# Patient Record
Sex: Female | Born: 1980
Health system: Southern US, Community
[De-identification: ages and names within clinical notes are randomized; demographics above are authoritative.]

## PROBLEM LIST (undated history)

## (undated) ENCOUNTER — Inpatient Hospital Stay (HOSPITAL_COMMUNITY): Payer: Self-pay

## (undated) DIAGNOSIS — N760 Acute vaginitis: Secondary | ICD-10-CM

## (undated) DIAGNOSIS — B9689 Other specified bacterial agents as the cause of diseases classified elsewhere: Secondary | ICD-10-CM

## (undated) DIAGNOSIS — O24419 Gestational diabetes mellitus in pregnancy, unspecified control: Secondary | ICD-10-CM

## (undated) DIAGNOSIS — I10 Essential (primary) hypertension: Secondary | ICD-10-CM

## (undated) DIAGNOSIS — G4730 Sleep apnea, unspecified: Secondary | ICD-10-CM

## (undated) DIAGNOSIS — T4145XA Adverse effect of unspecified anesthetic, initial encounter: Secondary | ICD-10-CM

## (undated) DIAGNOSIS — S93402A Sprain of unspecified ligament of left ankle, initial encounter: Secondary | ICD-10-CM

## (undated) DIAGNOSIS — N390 Urinary tract infection, site not specified: Secondary | ICD-10-CM

## (undated) DIAGNOSIS — T8859XA Other complications of anesthesia, initial encounter: Secondary | ICD-10-CM

## (undated) DIAGNOSIS — E669 Obesity, unspecified: Secondary | ICD-10-CM

## (undated) HISTORY — DX: Obesity, unspecified: E66.9

## (undated) HISTORY — DX: Essential (primary) hypertension: I10

## (undated) HISTORY — DX: Morbid (severe) obesity due to excess calories: E66.01
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
## 2002-09-01 ENCOUNTER — Encounter: Payer: Self-pay | Admitting: Emergency Medicine

## 2002-09-01 ENCOUNTER — Emergency Department (HOSPITAL_COMMUNITY): Admission: EM | Admit: 2002-09-01 | Discharge: 2002-09-01 | Payer: Self-pay | Admitting: Emergency Medicine

## 2003-10-19 ENCOUNTER — Encounter: Admission: RE | Admit: 2003-10-19 | Discharge: 2003-10-19 | Payer: Self-pay | Admitting: Family Medicine

## 2003-10-19 ENCOUNTER — Other Ambulatory Visit: Admission: RE | Admit: 2003-10-19 | Discharge: 2003-10-19 | Payer: Self-pay | Admitting: Family Medicine

## 2004-12-31 ENCOUNTER — Emergency Department (HOSPITAL_COMMUNITY): Admission: EM | Admit: 2004-12-31 | Discharge: 2004-12-31 | Payer: Self-pay | Admitting: Emergency Medicine

## 2005-04-22 ENCOUNTER — Inpatient Hospital Stay (HOSPITAL_COMMUNITY): Admission: EM | Admit: 2005-04-22 | Discharge: 2005-04-25 | Payer: Self-pay | Admitting: Emergency Medicine

## 2005-08-01 ENCOUNTER — Other Ambulatory Visit: Admission: RE | Admit: 2005-08-01 | Discharge: 2005-08-01 | Payer: Self-pay | Admitting: Family Medicine

## 2005-12-18 ENCOUNTER — Emergency Department (HOSPITAL_COMMUNITY): Admission: EM | Admit: 2005-12-18 | Discharge: 2005-12-18 | Payer: Self-pay | Admitting: *Deleted

## 2006-05-27 HISTORY — PX: OTHER SURGICAL HISTORY: SHX169

## 2006-07-13 ENCOUNTER — Inpatient Hospital Stay (HOSPITAL_COMMUNITY): Admission: AD | Admit: 2006-07-13 | Discharge: 2006-07-13 | Payer: Self-pay | Admitting: Family Medicine

## 2006-07-13 ENCOUNTER — Emergency Department (HOSPITAL_COMMUNITY): Admission: EM | Admit: 2006-07-13 | Discharge: 2006-07-13 | Payer: Self-pay | Admitting: Emergency Medicine

## 2006-10-07 ENCOUNTER — Encounter (INDEPENDENT_AMBULATORY_CARE_PROVIDER_SITE_OTHER): Payer: Self-pay | Admitting: Specialist

## 2006-10-07 ENCOUNTER — Ambulatory Visit (HOSPITAL_COMMUNITY): Admission: RE | Admit: 2006-10-07 | Discharge: 2006-10-07 | Payer: Self-pay | Admitting: Obstetrics and Gynecology

## 2007-07-13 ENCOUNTER — Ambulatory Visit: Payer: Self-pay | Admitting: Obstetrics & Gynecology

## 2007-07-13 ENCOUNTER — Inpatient Hospital Stay (HOSPITAL_COMMUNITY): Admission: AD | Admit: 2007-07-13 | Discharge: 2007-07-13 | Payer: Self-pay | Admitting: Obstetrics & Gynecology

## 2007-07-29 ENCOUNTER — Ambulatory Visit: Payer: Self-pay | Admitting: Obstetrics & Gynecology

## 2007-07-31 ENCOUNTER — Ambulatory Visit (HOSPITAL_COMMUNITY): Admission: RE | Admit: 2007-07-31 | Discharge: 2007-07-31 | Payer: Self-pay | Admitting: Obstetrics & Gynecology

## 2007-08-04 ENCOUNTER — Ambulatory Visit: Payer: Self-pay | Admitting: *Deleted

## 2007-08-04 ENCOUNTER — Inpatient Hospital Stay (HOSPITAL_COMMUNITY): Admission: AD | Admit: 2007-08-04 | Discharge: 2007-08-04 | Payer: Self-pay | Admitting: Obstetrics & Gynecology

## 2007-08-10 ENCOUNTER — Ambulatory Visit: Payer: Self-pay | Admitting: Obstetrics & Gynecology

## 2007-08-10 ENCOUNTER — Encounter: Admission: RE | Admit: 2007-08-10 | Discharge: 2007-08-17 | Payer: Self-pay | Admitting: Obstetrics & Gynecology

## 2007-08-13 ENCOUNTER — Ambulatory Visit: Payer: Self-pay | Admitting: Gynecology

## 2007-08-16 ENCOUNTER — Ambulatory Visit: Payer: Self-pay | Admitting: Obstetrics and Gynecology

## 2007-08-16 ENCOUNTER — Inpatient Hospital Stay (HOSPITAL_COMMUNITY): Admission: AD | Admit: 2007-08-16 | Discharge: 2007-08-16 | Payer: Self-pay | Admitting: Obstetrics & Gynecology

## 2007-08-17 ENCOUNTER — Ambulatory Visit: Payer: Self-pay | Admitting: Obstetrics & Gynecology

## 2007-08-24 ENCOUNTER — Ambulatory Visit: Payer: Self-pay | Admitting: Obstetrics & Gynecology

## 2007-08-25 ENCOUNTER — Inpatient Hospital Stay (HOSPITAL_COMMUNITY): Admission: AD | Admit: 2007-08-25 | Discharge: 2007-08-25 | Payer: Self-pay | Admitting: Obstetrics & Gynecology

## 2007-08-25 ENCOUNTER — Ambulatory Visit: Payer: Self-pay | Admitting: *Deleted

## 2007-08-31 ENCOUNTER — Ambulatory Visit (HOSPITAL_COMMUNITY): Admission: RE | Admit: 2007-08-31 | Discharge: 2007-08-31 | Payer: Self-pay | Admitting: Obstetrics & Gynecology

## 2007-08-31 ENCOUNTER — Ambulatory Visit: Payer: Self-pay | Admitting: Obstetrics & Gynecology

## 2007-09-02 ENCOUNTER — Ambulatory Visit: Payer: Self-pay | Admitting: Gynecology

## 2007-09-02 ENCOUNTER — Inpatient Hospital Stay (HOSPITAL_COMMUNITY): Admission: AD | Admit: 2007-09-02 | Discharge: 2007-09-05 | Payer: Self-pay | Admitting: Obstetrics & Gynecology

## 2008-02-15 ENCOUNTER — Emergency Department (HOSPITAL_COMMUNITY): Admission: EM | Admit: 2008-02-15 | Discharge: 2008-02-15 | Payer: Self-pay | Admitting: Emergency Medicine

## 2008-04-27 ENCOUNTER — Emergency Department (HOSPITAL_COMMUNITY): Admission: EM | Admit: 2008-04-27 | Discharge: 2008-04-27 | Payer: Self-pay | Admitting: Emergency Medicine

## 2008-06-18 ENCOUNTER — Emergency Department (HOSPITAL_COMMUNITY): Admission: EM | Admit: 2008-06-18 | Discharge: 2008-06-18 | Payer: Self-pay | Admitting: Emergency Medicine

## 2008-07-21 ENCOUNTER — Emergency Department (HOSPITAL_COMMUNITY): Admission: EM | Admit: 2008-07-21 | Discharge: 2008-07-21 | Payer: Self-pay | Admitting: Emergency Medicine

## 2008-09-11 ENCOUNTER — Emergency Department (HOSPITAL_COMMUNITY): Admission: EM | Admit: 2008-09-11 | Discharge: 2008-09-11 | Payer: Self-pay | Admitting: Emergency Medicine

## 2008-10-07 ENCOUNTER — Emergency Department (HOSPITAL_COMMUNITY): Admission: EM | Admit: 2008-10-07 | Discharge: 2008-10-07 | Payer: Self-pay | Admitting: Emergency Medicine

## 2008-11-10 ENCOUNTER — Emergency Department (HOSPITAL_COMMUNITY): Admission: EM | Admit: 2008-11-10 | Discharge: 2008-11-10 | Payer: Self-pay | Admitting: Emergency Medicine

## 2008-11-11 ENCOUNTER — Observation Stay (HOSPITAL_COMMUNITY): Admission: EM | Admit: 2008-11-11 | Discharge: 2008-11-11 | Payer: Self-pay | Admitting: Emergency Medicine

## 2008-12-08 ENCOUNTER — Emergency Department (HOSPITAL_COMMUNITY): Admission: EM | Admit: 2008-12-08 | Discharge: 2008-12-08 | Payer: Self-pay | Admitting: Emergency Medicine

## 2008-12-09 ENCOUNTER — Emergency Department (HOSPITAL_COMMUNITY): Admission: EM | Admit: 2008-12-09 | Discharge: 2008-12-09 | Payer: Self-pay | Admitting: Emergency Medicine

## 2009-01-04 ENCOUNTER — Emergency Department (HOSPITAL_COMMUNITY): Admission: EM | Admit: 2009-01-04 | Discharge: 2009-01-04 | Payer: Self-pay | Admitting: Emergency Medicine

## 2009-01-17 ENCOUNTER — Emergency Department (HOSPITAL_COMMUNITY): Admission: EM | Admit: 2009-01-17 | Discharge: 2009-01-17 | Payer: Self-pay | Admitting: Family Medicine

## 2009-02-21 ENCOUNTER — Emergency Department (HOSPITAL_COMMUNITY): Admission: EM | Admit: 2009-02-21 | Discharge: 2009-02-22 | Payer: Self-pay | Admitting: Emergency Medicine

## 2009-02-22 ENCOUNTER — Inpatient Hospital Stay (HOSPITAL_COMMUNITY): Admission: AD | Admit: 2009-02-22 | Discharge: 2009-02-22 | Payer: Self-pay | Admitting: Obstetrics and Gynecology

## 2009-04-01 ENCOUNTER — Emergency Department (HOSPITAL_COMMUNITY): Admission: EM | Admit: 2009-04-01 | Discharge: 2009-04-02 | Payer: Self-pay | Admitting: Emergency Medicine

## 2009-04-24 ENCOUNTER — Emergency Department (HOSPITAL_COMMUNITY): Admission: EM | Admit: 2009-04-24 | Discharge: 2009-04-24 | Payer: Self-pay | Admitting: Emergency Medicine

## 2009-06-28 ENCOUNTER — Emergency Department (HOSPITAL_COMMUNITY): Admission: EM | Admit: 2009-06-28 | Discharge: 2009-06-28 | Payer: Self-pay | Admitting: Emergency Medicine

## 2009-08-21 ENCOUNTER — Emergency Department (HOSPITAL_COMMUNITY): Admission: EM | Admit: 2009-08-21 | Discharge: 2009-08-21 | Payer: Self-pay | Admitting: Emergency Medicine

## 2009-09-09 ENCOUNTER — Emergency Department (HOSPITAL_COMMUNITY): Admission: EM | Admit: 2009-09-09 | Discharge: 2009-09-09 | Payer: Self-pay | Admitting: Emergency Medicine

## 2009-09-24 ENCOUNTER — Emergency Department (HOSPITAL_COMMUNITY): Admission: EM | Admit: 2009-09-24 | Discharge: 2009-09-24 | Payer: Self-pay | Admitting: Family Medicine

## 2009-09-24 ENCOUNTER — Emergency Department (HOSPITAL_COMMUNITY): Admission: EM | Admit: 2009-09-24 | Discharge: 2009-09-24 | Payer: Self-pay | Admitting: Emergency Medicine

## 2009-10-30 ENCOUNTER — Emergency Department (HOSPITAL_COMMUNITY): Admission: EM | Admit: 2009-10-30 | Discharge: 2009-10-30 | Payer: Self-pay | Admitting: Emergency Medicine

## 2009-11-22 ENCOUNTER — Emergency Department (HOSPITAL_COMMUNITY): Admission: EM | Admit: 2009-11-22 | Discharge: 2009-11-22 | Payer: Self-pay | Admitting: Emergency Medicine

## 2009-12-19 ENCOUNTER — Emergency Department (HOSPITAL_COMMUNITY): Admission: EM | Admit: 2009-12-19 | Discharge: 2009-12-19 | Payer: Self-pay | Admitting: Emergency Medicine

## 2010-01-10 ENCOUNTER — Ambulatory Visit: Payer: Self-pay | Admitting: Obstetrics and Gynecology

## 2010-02-01 ENCOUNTER — Ambulatory Visit (HOSPITAL_COMMUNITY): Admission: RE | Admit: 2010-02-01 | Discharge: 2010-02-01 | Payer: Self-pay | Admitting: Obstetrics & Gynecology

## 2010-02-08 ENCOUNTER — Encounter (INDEPENDENT_AMBULATORY_CARE_PROVIDER_SITE_OTHER): Payer: Self-pay | Admitting: *Deleted

## 2010-02-08 ENCOUNTER — Ambulatory Visit: Payer: Self-pay | Admitting: Obstetrics and Gynecology

## 2010-02-08 LAB — CONVERTED CEMR LAB
Chlamydia, DNA Probe: NEGATIVE
GC Probe Amp, Genital: NEGATIVE

## 2010-02-09 ENCOUNTER — Encounter (INDEPENDENT_AMBULATORY_CARE_PROVIDER_SITE_OTHER): Payer: Self-pay | Admitting: *Deleted

## 2010-02-09 LAB — CONVERTED CEMR LAB
Chloride: 106 meq/L (ref 96–112)
Potassium: 4.2 meq/L (ref 3.5–5.3)
Sodium: 141 meq/L (ref 135–145)

## 2010-02-22 ENCOUNTER — Ambulatory Visit (HOSPITAL_COMMUNITY): Admission: RE | Admit: 2010-02-22 | Discharge: 2010-02-22 | Payer: Self-pay | Admitting: Obstetrics & Gynecology

## 2010-02-23 ENCOUNTER — Ambulatory Visit: Payer: Self-pay | Admitting: Family

## 2010-02-23 ENCOUNTER — Inpatient Hospital Stay (HOSPITAL_COMMUNITY): Admission: AD | Admit: 2010-02-23 | Discharge: 2010-02-23 | Payer: Self-pay | Admitting: Obstetrics & Gynecology

## 2010-02-24 ENCOUNTER — Emergency Department (HOSPITAL_COMMUNITY): Admission: EM | Admit: 2010-02-24 | Discharge: 2010-02-24 | Payer: Self-pay | Admitting: Family Medicine

## 2010-03-01 ENCOUNTER — Ambulatory Visit: Payer: Self-pay | Admitting: Obstetrics and Gynecology

## 2010-03-27 ENCOUNTER — Emergency Department (HOSPITAL_COMMUNITY)
Admission: EM | Admit: 2010-03-27 | Discharge: 2010-03-27 | Payer: Self-pay | Source: Home / Self Care | Admitting: Emergency Medicine

## 2010-05-24 ENCOUNTER — Encounter
Admission: RE | Admit: 2010-05-24 | Discharge: 2010-05-24 | Payer: Self-pay | Source: Home / Self Care | Attending: Family Medicine | Admitting: Family Medicine

## 2010-05-29 ENCOUNTER — Ambulatory Visit: Payer: Self-pay | Admitting: Cardiology

## 2010-06-17 ENCOUNTER — Encounter: Payer: Self-pay | Admitting: Obstetrics & Gynecology

## 2010-06-17 ENCOUNTER — Encounter: Payer: Self-pay | Admitting: *Deleted

## 2010-06-25 ENCOUNTER — Emergency Department (HOSPITAL_COMMUNITY)
Admission: EM | Admit: 2010-06-25 | Discharge: 2010-06-25 | Payer: Self-pay | Source: Home / Self Care | Admitting: Emergency Medicine

## 2010-07-17 ENCOUNTER — Inpatient Hospital Stay (HOSPITAL_COMMUNITY): Payer: PRIVATE HEALTH INSURANCE

## 2010-07-17 ENCOUNTER — Inpatient Hospital Stay (HOSPITAL_COMMUNITY)
Admission: AD | Admit: 2010-07-17 | Discharge: 2010-07-17 | Disposition: A | Discharge status: Home / Self Care | Payer: PRIVATE HEALTH INSURANCE | Source: Ambulatory Visit | Attending: Obstetrics & Gynecology | Admitting: Obstetrics & Gynecology

## 2010-07-17 DIAGNOSIS — R52 Pain, unspecified: Secondary | ICD-10-CM

## 2010-07-17 DIAGNOSIS — R109 Unspecified abdominal pain: Secondary | ICD-10-CM

## 2010-07-17 LAB — URINALYSIS, ROUTINE W REFLEX MICROSCOPIC
Protein, ur: NEGATIVE mg/dL
Urine Glucose, Fasting: NEGATIVE mg/dL
pH: 6.5 (ref 5.0–8.0)

## 2010-07-17 LAB — CBC
HCT: 36.4 % (ref 36.0–46.0)
Hemoglobin: 12 g/dL (ref 12.0–15.0)
RDW: 14.5 % (ref 11.5–15.5)
WBC: 7.1 10*3/uL (ref 4.0–10.5)

## 2010-07-17 LAB — WET PREP, GENITAL
Clue Cells Wet Prep HPF POC: NONE SEEN
Trich, Wet Prep: NONE SEEN
Yeast Wet Prep HPF POC: NONE SEEN

## 2010-07-18 LAB — GC/CHLAMYDIA PROBE AMP, GENITAL: Chlamydia, DNA Probe: NEGATIVE

## 2010-07-26 ENCOUNTER — Institutional Professional Consult (permissible substitution): Payer: PRIVATE HEALTH INSURANCE | Admitting: Family Medicine

## 2010-08-06 ENCOUNTER — Institutional Professional Consult (permissible substitution) (INDEPENDENT_AMBULATORY_CARE_PROVIDER_SITE_OTHER): Payer: PRIVATE HEALTH INSURANCE | Admitting: Family Medicine

## 2010-08-06 DIAGNOSIS — R030 Elevated blood-pressure reading, without diagnosis of hypertension: Secondary | ICD-10-CM

## 2010-08-06 DIAGNOSIS — E119 Type 2 diabetes mellitus without complications: Secondary | ICD-10-CM

## 2010-08-07 LAB — CBC
HCT: 35.8 % — ABNORMAL LOW (ref 36.0–46.0)
Hemoglobin: 12.2 g/dL (ref 12.0–15.0)
MCHC: 34.1 g/dL (ref 30.0–36.0)
RDW: 14 % (ref 11.5–15.5)
WBC: 9.2 10*3/uL (ref 4.0–10.5)

## 2010-08-07 LAB — URINE CULTURE

## 2010-08-07 LAB — COMPREHENSIVE METABOLIC PANEL
ALT: 15 U/L (ref 0–35)
AST: 19 U/L (ref 0–37)
Alkaline Phosphatase: 57 U/L (ref 39–117)
CO2: 22 mEq/L (ref 19–32)
Calcium: 9 mg/dL (ref 8.4–10.5)
GFR calc Af Amer: >60 mL/min (ref >60)
Potassium: 3.7 mEq/L (ref 3.5–5.1)
Sodium: 139 mEq/L (ref 135–145)
Total Protein: 6.7 g/dL (ref 6.0–8.3)

## 2010-08-07 LAB — POCT I-STAT 3, ART BLOOD GAS (G3+)
Acid-Base Excess: 1 mmol/L (ref 0.0–2.0)
O2 Saturation: 99 %
Patient temperature: 98.6
TCO2: 24 mmol/L (ref 0–100)

## 2010-08-07 LAB — URINALYSIS, ROUTINE W REFLEX MICROSCOPIC
Bilirubin Urine: NEGATIVE
Hgb urine dipstick: NEGATIVE
Ketones, ur: NEGATIVE mg/dL
Nitrite: NEGATIVE
pH: 6.5 (ref 5.0–8.0)

## 2010-08-07 LAB — DIFFERENTIAL
Basophils Relative: 0 % (ref 0–1)
Eosinophils Absolute: 0.1 10*3/uL (ref 0.0–0.7)
Eosinophils Relative: 2 % (ref 0–5)
Lymphs Abs: 3.8 10*3/uL (ref 0.7–4.0)
Monocytes Relative: 7 % (ref 3–12)

## 2010-08-09 LAB — URINALYSIS, ROUTINE W REFLEX MICROSCOPIC
Bilirubin Urine: NEGATIVE
Hgb urine dipstick: NEGATIVE
Ketones, ur: NEGATIVE mg/dL
Specific Gravity, Urine: 1.025 (ref 1.005–1.030)
Urobilinogen, UA: 0.2 mg/dL (ref 0.0–1.0)

## 2010-08-09 LAB — POCT URINALYSIS DIPSTICK
Bilirubin Urine: NEGATIVE
Nitrite: POSITIVE — AB
Protein, ur: 30 mg/dL — AB
Urobilinogen, UA: 0.2 mg/dL (ref 0.0–1.0)
pH: 6 (ref 5.0–8.0)

## 2010-08-11 LAB — GC/CHLAMYDIA PROBE AMP, GENITAL: Chlamydia, DNA Probe: NEGATIVE

## 2010-08-11 LAB — WET PREP, GENITAL
Clue Cells Wet Prep HPF POC: NONE SEEN
Trich, Wet Prep: NONE SEEN

## 2010-08-11 LAB — URINALYSIS, ROUTINE W REFLEX MICROSCOPIC
Glucose, UA: NEGATIVE mg/dL
Ketones, ur: NEGATIVE mg/dL
pH: 6 (ref 5.0–8.0)

## 2010-08-11 LAB — POCT PREGNANCY, URINE: Preg Test, Ur: NEGATIVE

## 2010-08-13 LAB — CBC
Hemoglobin: 11.7 g/dL — ABNORMAL LOW (ref 12.0–15.0)
RBC: 4.25 MIL/uL (ref 3.87–5.11)

## 2010-08-13 LAB — POCT PREGNANCY, URINE: Preg Test, Ur: NEGATIVE

## 2010-08-13 LAB — GLUCOSE, CAPILLARY: Glucose-Capillary: 139 mg/dL — ABNORMAL HIGH (ref 70–99)

## 2010-08-14 LAB — POCT URINALYSIS DIP (DEVICE)
Bilirubin Urine: NEGATIVE
Glucose, UA: NEGATIVE mg/dL
Ketones, ur: NEGATIVE mg/dL
Specific Gravity, Urine: 1.015 (ref 1.005–1.030)

## 2010-08-14 LAB — URINE MICROSCOPIC-ADD ON

## 2010-08-14 LAB — URINALYSIS, ROUTINE W REFLEX MICROSCOPIC
Nitrite: NEGATIVE
Specific Gravity, Urine: 1.019 (ref 1.005–1.030)
pH: 7.5 (ref 5.0–8.0)

## 2010-08-14 LAB — POCT PREGNANCY, URINE: Preg Test, Ur: NEGATIVE

## 2010-08-14 LAB — URINE CULTURE

## 2010-08-14 LAB — GLUCOSE, CAPILLARY: Glucose-Capillary: 113 mg/dL — ABNORMAL HIGH (ref 70–99)

## 2010-08-15 LAB — URINALYSIS, ROUTINE W REFLEX MICROSCOPIC
Bilirubin Urine: NEGATIVE
Nitrite: NEGATIVE
Protein, ur: NEGATIVE mg/dL
Specific Gravity, Urine: 1.015 (ref 1.005–1.030)
Urobilinogen, UA: 0.2 mg/dL (ref 0.0–1.0)

## 2010-08-15 LAB — WET PREP, GENITAL
Clue Cells Wet Prep HPF POC: NONE SEEN
Trich, Wet Prep: NONE SEEN
Yeast Wet Prep HPF POC: NONE SEEN

## 2010-08-15 LAB — COMPREHENSIVE METABOLIC PANEL
BUN: 9 mg/dL (ref 6–23)
CO2: 26 mEq/L (ref 19–32)
Calcium: 8.9 mg/dL (ref 8.4–10.5)
Creatinine, Ser: 0.73 mg/dL (ref 0.4–1.2)
GFR calc non Af Amer: >60 mL/min (ref >60)
Glucose, Bld: 103 mg/dL — ABNORMAL HIGH (ref 70–99)

## 2010-08-15 LAB — GLUCOSE, CAPILLARY
Glucose-Capillary: 101 mg/dL — ABNORMAL HIGH (ref 70–99)
Glucose-Capillary: 96 mg/dL (ref 70–99)

## 2010-08-15 LAB — DIFFERENTIAL
Eosinophils Relative: 0 % (ref 0–5)
Lymphocytes Relative: 12 % (ref 12–46)
Lymphs Abs: 0.7 10*3/uL (ref 0.7–4.0)
Monocytes Absolute: 0.3 10*3/uL (ref 0.1–1.0)
Neutro Abs: 4.8 10*3/uL (ref 1.7–7.7)

## 2010-08-15 LAB — GC/CHLAMYDIA PROBE AMP, GENITAL: GC Probe Amp, Genital: NEGATIVE

## 2010-08-15 LAB — CBC
HCT: 38.4 % (ref 36.0–46.0)
Hemoglobin: 13.1 g/dL (ref 12.0–15.0)
WBC: 5.8 10*3/uL (ref 4.0–10.5)

## 2010-08-19 LAB — CBC
HCT: 37.7 % (ref 36.0–46.0)
MCHC: 33.6 g/dL (ref 30.0–36.0)
Platelets: 267 10*3/uL (ref 150–400)
RDW: 14.6 % (ref 11.5–15.5)

## 2010-08-19 LAB — DIFFERENTIAL
Lymphocytes Relative: 38 % (ref 12–46)
Lymphs Abs: 3 10*3/uL (ref 0.7–4.0)
Monocytes Absolute: 0.1 10*3/uL (ref 0.1–1.0)
Monocytes Relative: 2 % — ABNORMAL LOW (ref 3–12)
Neutro Abs: 4.5 10*3/uL (ref 1.7–7.7)
Neutrophils Relative %: 57 % (ref 43–77)

## 2010-08-19 LAB — URINALYSIS, ROUTINE W REFLEX MICROSCOPIC
Bilirubin Urine: NEGATIVE
Glucose, UA: NEGATIVE mg/dL
Ketones, ur: NEGATIVE mg/dL
Protein, ur: NEGATIVE mg/dL

## 2010-08-19 LAB — COMPREHENSIVE METABOLIC PANEL
Albumin: 3.5 g/dL (ref 3.5–5.2)
Alkaline Phosphatase: 53 U/L (ref 39–117)
BUN: 7 mg/dL (ref 6–23)
Calcium: 9.5 mg/dL (ref 8.4–10.5)
Creatinine, Ser: 0.65 mg/dL (ref 0.4–1.2)
Glucose, Bld: 83 mg/dL (ref 70–99)
Potassium: 3.7 mEq/L (ref 3.5–5.1)
Total Protein: 6.8 g/dL (ref 6.0–8.3)

## 2010-08-29 LAB — URINALYSIS, ROUTINE W REFLEX MICROSCOPIC
Bilirubin Urine: NEGATIVE
Bilirubin Urine: NEGATIVE
Hgb urine dipstick: NEGATIVE
Hgb urine dipstick: NEGATIVE
Ketones, ur: NEGATIVE mg/dL
Nitrite: NEGATIVE
Specific Gravity, Urine: 1.022 (ref 1.005–1.030)
Specific Gravity, Urine: 1.018 (ref 1.005–1.030)
Urobilinogen, UA: 0.2 mg/dL (ref 0.0–1.0)
Urobilinogen, UA: 0.2 mg/dL (ref 0.0–1.0)
pH: 6.5 (ref 5.0–8.0)
pH: 7 (ref 5.0–8.0)

## 2010-08-29 LAB — GC/CHLAMYDIA PROBE AMP, GENITAL
Chlamydia, DNA Probe: NEGATIVE
Chlamydia, DNA Probe: NEGATIVE
GC Probe Amp, Genital: NEGATIVE
GC Probe Amp, Genital: NEGATIVE

## 2010-08-29 LAB — WET PREP, GENITAL
Trich, Wet Prep: NONE SEEN
Yeast Wet Prep HPF POC: NONE SEEN
Yeast Wet Prep HPF POC: NONE SEEN

## 2010-08-31 LAB — URINE CULTURE

## 2010-08-31 LAB — URINALYSIS, ROUTINE W REFLEX MICROSCOPIC
Glucose, UA: NEGATIVE mg/dL
Hgb urine dipstick: NEGATIVE
Ketones, ur: NEGATIVE mg/dL
Protein, ur: NEGATIVE mg/dL
Urobilinogen, UA: 0.2 mg/dL (ref 0.0–1.0)

## 2010-08-31 LAB — WET PREP, GENITAL
Trich, Wet Prep: NONE SEEN
Yeast Wet Prep HPF POC: NONE SEEN

## 2010-08-31 LAB — ABO/RH: ABO/RH(D): B POS

## 2010-08-31 LAB — POCT PREGNANCY, URINE: Preg Test, Ur: NEGATIVE

## 2010-09-01 LAB — COMPREHENSIVE METABOLIC PANEL
AST: 20 U/L (ref 0–37)
Albumin: 3.8 g/dL (ref 3.5–5.2)
Albumin: 3.6 g/dL (ref 3.5–5.2)
Alkaline Phosphatase: 53 U/L (ref 39–117)
BUN: 6 mg/dL (ref 6–23)
Calcium: 9.1 mg/dL (ref 8.4–10.5)
Calcium: 9 mg/dL (ref 8.4–10.5)
Chloride: 106 mEq/L (ref 96–112)
Creatinine, Ser: 0.62 mg/dL (ref 0.4–1.2)
GFR calc Af Amer: >60 mL/min (ref >60)
Glucose, Bld: 119 mg/dL — ABNORMAL HIGH (ref 70–99)
Potassium: 3.6 mEq/L (ref 3.5–5.1)
Sodium: 141 mEq/L (ref 135–145)
Total Bilirubin: 0.3 mg/dL (ref 0.3–1.2)
Total Protein: 7.1 g/dL (ref 6.0–8.3)
Total Protein: 6.5 g/dL (ref 6.0–8.3)

## 2010-09-01 LAB — GC/CHLAMYDIA PROBE AMP, GENITAL
Chlamydia, DNA Probe: NEGATIVE
GC Probe Amp, Genital: NEGATIVE

## 2010-09-01 LAB — WET PREP, GENITAL
Clue Cells Wet Prep HPF POC: NONE SEEN
Trich, Wet Prep: NONE SEEN
WBC, Wet Prep HPF POC: NONE SEEN
Yeast Wet Prep HPF POC: NONE SEEN

## 2010-09-01 LAB — URINALYSIS, ROUTINE W REFLEX MICROSCOPIC
Bilirubin Urine: NEGATIVE
Glucose, UA: NEGATIVE mg/dL
Hgb urine dipstick: NEGATIVE
Specific Gravity, Urine: 1.027 (ref 1.005–1.030)
pH: 6 (ref 5.0–8.0)

## 2010-09-01 LAB — CBC
HCT: 37.6 % (ref 36.0–46.0)
Hemoglobin: 12.7 g/dL (ref 12.0–15.0)
MCHC: 33.9 g/dL (ref 30.0–36.0)
MCV: 83.9 fL (ref 78.0–100.0)
Platelets: 232 10*3/uL (ref 150–400)
Platelets: 265 10*3/uL (ref 150–400)
RDW: 13.9 % (ref 11.5–15.5)
RDW: 13.6 % (ref 11.5–15.5)
WBC: 9.1 10*3/uL (ref 4.0–10.5)

## 2010-09-01 LAB — POCT URINALYSIS DIP (DEVICE)
Bilirubin Urine: NEGATIVE
Glucose, UA: NEGATIVE mg/dL
Nitrite: NEGATIVE
Urobilinogen, UA: 0.2 mg/dL (ref 0.0–1.0)
pH: 7 (ref 5.0–8.0)

## 2010-09-01 LAB — DIFFERENTIAL
Eosinophils Relative: 2 % (ref 0–5)
Lymphocytes Relative: 31 % (ref 12–46)
Lymphs Abs: 2.8 10*3/uL (ref 0.7–4.0)
Lymphs Abs: 2.5 10*3/uL (ref 0.7–4.0)
Monocytes Absolute: 0.8 10*3/uL (ref 0.1–1.0)
Monocytes Absolute: 0.4 10*3/uL (ref 0.1–1.0)
Monocytes Relative: 8 % (ref 3–12)
Monocytes Relative: 6 % (ref 3–12)
Neutro Abs: 5.3 10*3/uL (ref 1.7–7.7)
Neutro Abs: 3.6 10*3/uL (ref 1.7–7.7)
Neutrophils Relative %: 54 % (ref 43–77)

## 2010-09-01 LAB — URINE CULTURE: Colony Count: 70000

## 2010-09-01 LAB — POCT PREGNANCY, URINE: Preg Test, Ur: NEGATIVE

## 2010-09-01 LAB — URINE MICROSCOPIC-ADD ON

## 2010-09-03 LAB — BASIC METABOLIC PANEL
BUN: 7 mg/dL (ref 6–23)
Calcium: 9.5 mg/dL (ref 8.4–10.5)
Creatinine, Ser: 0.71 mg/dL (ref 0.4–1.2)
GFR calc non Af Amer: >60 mL/min (ref >60)
Glucose, Bld: 91 mg/dL (ref 70–99)

## 2010-09-03 LAB — CBC
HCT: 38.2 % (ref 36.0–46.0)
Platelets: 341 10*3/uL (ref 150–400)
RDW: 14.4 % (ref 11.5–15.5)
WBC: 10.7 10*3/uL — ABNORMAL HIGH (ref 4.0–10.5)

## 2010-09-03 LAB — DIFFERENTIAL
Basophils Absolute: 0 10*3/uL (ref 0.0–0.1)
Eosinophils Relative: 0 % (ref 0–5)
Lymphocytes Relative: 26 % (ref 12–46)
Neutro Abs: 6.9 10*3/uL (ref 1.7–7.7)
Neutrophils Relative %: 65 % (ref 43–77)

## 2010-09-03 LAB — D-DIMER, QUANTITATIVE: D-Dimer, Quant: <0.22 ug/mL-FEU (ref 0.00–0.48)

## 2010-09-04 LAB — CBC
HCT: 37.3 % (ref 36.0–46.0)
Platelets: 275 10*3/uL (ref 150–400)
RBC: 4.56 MIL/uL (ref 3.87–5.11)
WBC: 7.3 10*3/uL (ref 4.0–10.5)

## 2010-09-04 LAB — BASIC METABOLIC PANEL
BUN: 6 mg/dL (ref 6–23)
Creatinine, Ser: 0.55 mg/dL (ref 0.4–1.2)
GFR calc Af Amer: >60 mL/min (ref >60)
GFR calc non Af Amer: >60 mL/min (ref >60)
Potassium: 3.8 mEq/L (ref 3.5–5.1)

## 2010-09-04 LAB — POCT CARDIAC MARKERS
Myoglobin, poc: 48.8 ng/mL (ref 12–200)
Troponin i, poc: <0.05 ng/mL (ref 0.00–0.09)

## 2010-09-04 LAB — DIFFERENTIAL
Lymphocytes Relative: 33 % (ref 12–46)
Lymphs Abs: 2.4 10*3/uL (ref 0.7–4.0)
Monocytes Relative: 6 % (ref 3–12)
Neutrophils Relative %: 55 % (ref 43–77)

## 2010-09-10 LAB — URINALYSIS, ROUTINE W REFLEX MICROSCOPIC
Bilirubin Urine: NEGATIVE
Glucose, UA: NEGATIVE mg/dL
Hgb urine dipstick: NEGATIVE
Ketones, ur: NEGATIVE mg/dL
Protein, ur: NEGATIVE mg/dL
Urobilinogen, UA: 1 mg/dL (ref 0.0–1.0)

## 2010-09-13 ENCOUNTER — Ambulatory Visit (INDEPENDENT_AMBULATORY_CARE_PROVIDER_SITE_OTHER): Payer: PRIVATE HEALTH INSURANCE | Admitting: Family Medicine

## 2010-09-13 DIAGNOSIS — R5381 Other malaise: Secondary | ICD-10-CM

## 2010-09-13 DIAGNOSIS — E119 Type 2 diabetes mellitus without complications: Secondary | ICD-10-CM

## 2010-09-13 DIAGNOSIS — I10 Essential (primary) hypertension: Secondary | ICD-10-CM

## 2010-09-13 DIAGNOSIS — R5383 Other fatigue: Secondary | ICD-10-CM

## 2010-09-13 DIAGNOSIS — R110 Nausea: Secondary | ICD-10-CM

## 2010-09-22 ENCOUNTER — Emergency Department (HOSPITAL_COMMUNITY)
Admission: EM | Admit: 2010-09-22 | Discharge: 2010-09-23 | Disposition: A | Discharge status: Home / Self Care | Payer: PRIVATE HEALTH INSURANCE | Attending: Emergency Medicine | Admitting: Emergency Medicine

## 2010-09-22 DIAGNOSIS — J45909 Unspecified asthma, uncomplicated: Secondary | ICD-10-CM | POA: Insufficient documentation

## 2010-09-22 DIAGNOSIS — M545 Low back pain, unspecified: Secondary | ICD-10-CM | POA: Insufficient documentation

## 2010-09-22 DIAGNOSIS — R42 Dizziness and giddiness: Secondary | ICD-10-CM | POA: Insufficient documentation

## 2010-09-22 DIAGNOSIS — R1031 Right lower quadrant pain: Secondary | ICD-10-CM | POA: Insufficient documentation

## 2010-09-22 LAB — URINALYSIS, ROUTINE W REFLEX MICROSCOPIC
Bilirubin Urine: NEGATIVE
Hgb urine dipstick: NEGATIVE
Specific Gravity, Urine: 1.016 (ref 1.005–1.030)
pH: 7 (ref 5.0–8.0)

## 2010-09-23 ENCOUNTER — Emergency Department (HOSPITAL_COMMUNITY): Payer: PRIVATE HEALTH INSURANCE

## 2010-09-23 ENCOUNTER — Inpatient Hospital Stay (HOSPITAL_COMMUNITY)
Admission: AD | Admit: 2010-09-23 | Discharge: 2010-09-23 | Disposition: A | Discharge status: Home / Self Care | Payer: PRIVATE HEALTH INSURANCE | Source: Ambulatory Visit | Attending: Obstetrics and Gynecology | Admitting: Obstetrics and Gynecology

## 2010-09-23 DIAGNOSIS — R109 Unspecified abdominal pain: Secondary | ICD-10-CM | POA: Insufficient documentation

## 2010-09-23 LAB — DIFFERENTIAL
Basophils Absolute: 0 10*3/uL (ref 0.0–0.1)
Eosinophils Relative: 2 % (ref 0–5)
Lymphocytes Relative: 34 % (ref 12–46)
Lymphs Abs: 3.2 10*3/uL (ref 0.7–4.0)
Monocytes Absolute: 0.8 10*3/uL (ref 0.1–1.0)
Neutro Abs: 5.1 10*3/uL (ref 1.7–7.7)

## 2010-09-23 LAB — CBC
HCT: 34.4 % — ABNORMAL LOW (ref 36.0–46.0)
Hemoglobin: 11.5 g/dL — ABNORMAL LOW (ref 12.0–15.0)
MCV: 78.2 fL (ref 78.0–100.0)
RDW: 14.4 % (ref 11.5–15.5)
WBC: 9.3 10*3/uL (ref 4.0–10.5)

## 2010-09-23 LAB — WET PREP, GENITAL
Clue Cells Wet Prep HPF POC: NONE SEEN
Trich, Wet Prep: NONE SEEN

## 2010-09-23 LAB — RPR: RPR Ser Ql: NONREACTIVE

## 2010-09-23 LAB — POCT I-STAT, CHEM 8
Chloride: 104 mEq/L (ref 96–112)
Creatinine, Ser: 0.8 mg/dL (ref 0.4–1.2)
Glucose, Bld: 129 mg/dL — ABNORMAL HIGH (ref 70–99)
HCT: 35 % — ABNORMAL LOW (ref 36.0–46.0)
Hemoglobin: 11.9 g/dL — ABNORMAL LOW (ref 12.0–15.0)
Potassium: 3.3 mEq/L — ABNORMAL LOW (ref 3.5–5.1)
Sodium: 140 mEq/L (ref 135–145)

## 2010-09-24 LAB — GC/CHLAMYDIA PROBE AMP, GENITAL
Chlamydia, DNA Probe: NEGATIVE
GC Probe Amp, Genital: NEGATIVE

## 2010-09-27 ENCOUNTER — Inpatient Hospital Stay (HOSPITAL_COMMUNITY): Payer: PRIVATE HEALTH INSURANCE

## 2010-09-27 ENCOUNTER — Inpatient Hospital Stay (HOSPITAL_COMMUNITY)
Admission: AD | Admit: 2010-09-27 | Discharge: 2010-09-27 | Disposition: A | Discharge status: Home / Self Care | Payer: PRIVATE HEALTH INSURANCE | Source: Ambulatory Visit | Attending: Obstetrics & Gynecology | Admitting: Obstetrics & Gynecology

## 2010-09-27 DIAGNOSIS — R52 Pain, unspecified: Secondary | ICD-10-CM

## 2010-09-27 DIAGNOSIS — O99891 Other specified diseases and conditions complicating pregnancy: Secondary | ICD-10-CM | POA: Insufficient documentation

## 2010-09-27 DIAGNOSIS — R109 Unspecified abdominal pain: Secondary | ICD-10-CM | POA: Insufficient documentation

## 2010-09-27 LAB — URINALYSIS, ROUTINE W REFLEX MICROSCOPIC
Bilirubin Urine: NEGATIVE
Ketones, ur: NEGATIVE mg/dL
Leukocytes, UA: NEGATIVE
Nitrite: POSITIVE — AB
Protein, ur: NEGATIVE mg/dL
Urobilinogen, UA: 0.2 mg/dL (ref 0.0–1.0)
pH: 7 (ref 5.0–8.0)

## 2010-09-27 LAB — CBC
HCT: 35 % — ABNORMAL LOW (ref 36.0–46.0)
Hemoglobin: 11.9 g/dL — ABNORMAL LOW (ref 12.0–15.0)
Hemoglobin: 11.7 g/dL — ABNORMAL LOW (ref 12.0–15.0)
MCH: 26.6 pg (ref 26.0–34.0)
MCH: 26.4 pg (ref 26.0–34.0)
MCHC: 33.4 g/dL (ref 30.0–36.0)
Platelets: 251 10*3/uL (ref 150–400)
RBC: 4.48 MIL/uL (ref 3.87–5.11)
RBC: 4.43 MIL/uL (ref 3.87–5.11)
WBC: 7.1 10*3/uL (ref 4.0–10.5)

## 2010-09-27 LAB — POCT PREGNANCY, URINE: Preg Test, Ur: POSITIVE

## 2010-09-27 LAB — ABO/RH: ABO/RH(D): B POS

## 2010-09-27 LAB — URINE MICROSCOPIC-ADD ON

## 2010-09-28 ENCOUNTER — Inpatient Hospital Stay (HOSPITAL_COMMUNITY)
Admission: AD | Admit: 2010-09-28 | Discharge: 2010-09-28 | Disposition: A | Discharge status: Home / Self Care | Payer: PRIVATE HEALTH INSURANCE | Source: Ambulatory Visit | Attending: Obstetrics & Gynecology | Admitting: Obstetrics & Gynecology

## 2010-09-28 DIAGNOSIS — O200 Threatened abortion: Secondary | ICD-10-CM | POA: Insufficient documentation

## 2010-10-01 ENCOUNTER — Other Ambulatory Visit: Payer: PRIVATE HEALTH INSURANCE

## 2010-10-01 DIAGNOSIS — Z0189 Encounter for other specified special examinations: Secondary | ICD-10-CM

## 2010-10-08 ENCOUNTER — Inpatient Hospital Stay (HOSPITAL_COMMUNITY)
Admission: AD | Admit: 2010-10-08 | Discharge: 2010-10-08 | Disposition: A | Discharge status: Home / Self Care | Payer: PRIVATE HEALTH INSURANCE | Source: Ambulatory Visit | Attending: Family Medicine | Admitting: Family Medicine

## 2010-10-08 ENCOUNTER — Inpatient Hospital Stay (HOSPITAL_COMMUNITY): Payer: PRIVATE HEALTH INSURANCE

## 2010-10-08 DIAGNOSIS — R109 Unspecified abdominal pain: Secondary | ICD-10-CM

## 2010-10-08 DIAGNOSIS — O00109 Unspecified tubal pregnancy without intrauterine pregnancy: Secondary | ICD-10-CM

## 2010-10-08 LAB — CBC
HCT: 34.7 % — ABNORMAL LOW (ref 36.0–46.0)
Hemoglobin: 11.6 g/dL — ABNORMAL LOW (ref 12.0–15.0)
Hemoglobin: 11.4 g/dL — ABNORMAL LOW (ref 12.0–15.0)
MCHC: 33.4 g/dL (ref 30.0–36.0)
MCV: 79.4 fL (ref 78.0–100.0)
Platelets: 266 10*3/uL (ref 150–400)
RBC: 4.37 MIL/uL (ref 3.87–5.11)
RBC: 4.28 MIL/uL (ref 3.87–5.11)
WBC: 7.2 10*3/uL (ref 4.0–10.5)
WBC: 7.9 10*3/uL (ref 4.0–10.5)

## 2010-10-08 LAB — DIFFERENTIAL
Eosinophils Absolute: 0.2 10*3/uL (ref 0.0–0.7)
Eosinophils Relative: 3 % (ref 0–5)
Lymphocytes Relative: 33 % (ref 12–46)
Lymphs Abs: 2.3 10*3/uL (ref 0.7–4.0)
Lymphs Abs: 2.8 10*3/uL (ref 0.7–4.0)
Monocytes Relative: 7 % (ref 3–12)
Monocytes Relative: 6 % (ref 3–12)
Neutro Abs: 4.3 10*3/uL (ref 1.7–7.7)
Neutrophils Relative %: 56 % (ref 43–77)
Neutrophils Relative %: 55 % (ref 43–77)

## 2010-10-08 LAB — BUN: BUN: 6 mg/dL (ref 6–23)

## 2010-10-08 LAB — CREATININE, SERUM
Creatinine, Ser: 0.61 mg/dL (ref 0.4–1.2)
GFR calc non Af Amer: >60 mL/min (ref >60)

## 2010-10-08 LAB — HCG, QUANTITATIVE, PREGNANCY: hCG, Beta Chain, Quant, S: 56 m[IU]/mL — ABNORMAL HIGH (ref <5)

## 2010-10-08 LAB — AST: AST: 14 U/L (ref 0–37)

## 2010-10-09 NOTE — Discharge Summary (Signed)
NAMEMarland Kitchen  BRIEANNA, NAU              ACCOUNT NO.:  [id number]   MEDICAL RECORD NO.:  [id number]          PATIENT TYPE:  INP   LOCATION:                                [facility]   PHYSICIAN:  Tanya S. Shawnie Pons, M.D.   DATE OF BIRTH:  1980/07/03   DATE OF ADMISSION:  08/31/2007  DATE OF DISCHARGE:  09/05/2007                               DISCHARGE SUMMARY   The patient was admitted on September 02, 2007, at 38 weeks and 6 days'  gestation as a 30 year old gravida 2, para 0-0-1-0, who presented with  spontaneous onset of labor.  The patient progressed slowly throughout  the course of her hospital stay, however, did not change past 5-cm  dilation.  A decision was made to proceed with a primary low-transverse  cesarean section on September 02, 2007, secondary to failure to progress.  The surgery was performed by Dr. Blima Rich and Dr. Karlton Lemon,  it went routine without complication and produced a female infant.  For  details of that, please see the dictated operative note.  Since  delivery, the patient has had an uneventful hospital course.  She was  gestational diabetic in her pregnancy; however, she has not required  medications since giving birth and had a random  CBG on postoperative  day #1 of 95.  Her pain has been well controlled.  Her infant is a female,  and she plans to have a circumcision performed outpatient.  The patient  is being discharged today on September 05, 2007, on postoperative day #3  with no complaints.  She is desiring discharge to home.  She is breast  and bottle feeding.  She does have low-transverse incision with staples  that will be removed by the baby love nurse on postoperative day #7.  She is planning Depo-Provera for contraceptive at discharge and then  reevaluate her 6-week visit at the [facility].  We will  also perform a 2-hour GTT secondary to her gestational diabetes at her 6-  week visit.  She has been given prescriptions for ibuprofen and  Percocet  and is instructed to continue her prenatal vitamins.   DISCHARGE DIAGNOSIS:  This is a 30 year old gravida 2, para 1-0-1-1,  postoperative day #3, status post primary low-transverse cesarean  section for failure to progress with a female infant in stable status.      Maylon Cos, C.N.M.      Shelbie Proctor. Shawnie Pons, M.D.  Electronically Signed    SS/MEDQ  D:  09/05/2007  T:  09/05/2007  Job:  [id number]

## 2010-10-09 NOTE — Op Note (Signed)
NAMEBEUNA, BOLDING              ACCOUNT NO.:  [id number]   MEDICAL RECORD NO.:  [id number]          PATIENT TYPE:  WOC   LOCATION:  WOC                          FACILITY:  [facility]   PHYSICIAN:  Ginger Carne, MD  DATE OF BIRTH:  04/29/1981   DATE OF PROCEDURE:  09/02/2007  DATE OF DISCHARGE:                               OPERATIVE REPORT   PREOPERATIVE DIAGNOSIS:  Failure to progress, first stage of labor.   POSTOPERATIVE DIAGNOSIS:  Failure to progress, first stage of labor,  term viable delivery of female infant.   PROCEDURE:  Primary low transverse cesarean section.   SURGEON:  Ginger Carne, MD   ASSISTANT:  None.   COMPLICATIONS:  None immediate.   ESTIMATED BLOOD LOSS:  700-800 mL.   ANESTHESIA:  Spinal.   SPECIMEN:  Cord bloods.   OPERATIVE FINDINGS:  Term infant female delivered in vertex presentation.  Apgar and weight per delivery room record (Apgars 9 and 9, weight 9  pounds 5 ounces).  No gross abnormalities.  Baby cried spontaneously at  delivery.  Amniotic fluid was clear, non foul-smelling.  Uterus, tubes  and ovaries showed normal decidual changes of pregnancy, fetus was in a  posterior deflexed presentation.   OPERATIVE PROCEDURE:  The patient prepped and draped in usual fashion  and placed in left lateral supine position.  Betadine solution used for  antiseptic and the patient was catheterized prior to procedure.  After  adequate spinal analgesia, a Pfannenstiel incision was made and the  abdomen opened.  Lower uterine segment incised transversely.  Baby  delivered, cord clamped and cut and infant given to the pediatric staff  after bulb suctioning.  Placenta removed manually.  Uterus inspected.  Closure of uterine musculature in one layer with 0 Vicryl running  interlocking suture from either end to midline.  Bleeding points  hemostatically checked.  Blood clots removed.  Closure of the fascia  with 0 PDS double loop running suture and skin  staples for the skin.  Instrument and sponge count were correct.  The patient tolerated the  procedure well, returned to post anesthesia recovery room in excellent  condition.     Ginger Carne, MD  Electronically Signed    SHB/MEDQ  D:  09/02/2007  T:  09/02/2007  Job:  [id number]

## 2010-10-11 ENCOUNTER — Inpatient Hospital Stay (HOSPITAL_COMMUNITY)
Admission: AD | Admit: 2010-10-11 | Discharge: 2010-10-11 | Disposition: A | Discharge status: Home / Self Care | Payer: PRIVATE HEALTH INSURANCE | Source: Ambulatory Visit | Attending: Family Medicine | Admitting: Family Medicine

## 2010-10-11 DIAGNOSIS — O00109 Unspecified tubal pregnancy without intrauterine pregnancy: Secondary | ICD-10-CM

## 2010-10-11 LAB — HCG, QUANTITATIVE, PREGNANCY: hCG, Beta Chain, Quant, S: 59 m[IU]/mL — ABNORMAL HIGH (ref <5)

## 2010-10-12 NOTE — H&P (Signed)
NAME:  Victoria [name], Victoria [name]              ACCOUNT NO.:  [id number]   MEDICAL RECORD NO.:  [id number]          PATIENT TYPE:  AMB   LOCATION:  SDC                           [facility]   PHYSICIAN:  Janine Limbo, M.D.DATE OF BIRTH:  Jan 14, 1981   DATE OF ADMISSION:  10/07/2006  DATE OF DISCHARGE:                              HISTORY & PHYSICAL   HISTORY OF PRESENT ILLNESS:  Ms. Larmore is a 30 year old, married,  African-American female, gravida zero, who presents for diagnostic  laparoscopy because of pelvic pain and history of left ovarian cyst  believed to be a dermoid.  For several months, the patient has  experienced intermittent dull to sharp pain in both lower quadrants of  her abdomen.  In February of this year, this pain increased in intensity  and persisted such that she was seen at [facility] emergency  department. At that time her symptoms were accompanied by nausea and  vomiting.  CBC, basic metabolic panel, lipase, and urinalysis were all  within normal limits at that visit, and her urine pregnancy test was  negative.  A CT scan with contrast of her abdomen and pelvis revealed a  left adnexal mass measuring 6.9 x 5.9 x 4.9 cm with components  containing fat and calcium.  Otherwise, CT scan of abdomen and pelvis  was within normal limits.  After further evaluation, the patient was  found to have been seen at [facility] emergency  department in August 2007 at which time she was found to have a left  ovarian cyst which measured 4.5 cm.  Since patient's visit to the  emergency department in February, she has continued to have constant  dull to sharp left lower quadrant pain which increases with intercourse  and when she drinks soft drinks.  She goes on to deny any urinary  tract symptoms, however, nausea, vomiting, diarrhea, fever, changes in  bowel habits, or vaginitis symptoms.  Given the chronicity of her  symptoms along with the discomfort  of her pain, the patient had  consented to proceed with diagnostic laparoscopy to further evaluate the  cause of her complaints.   OB HISTORY:  Gravida 0.   GYN HISTORY:  Menarche at 30 years old.  Last menstrual period Sep 30, 2006.  The patient's periods occur every other month.  She does not use  any contraception.  She has a history of abnormal Pap smear; however, in  recent years all of her Pap smears have been normal.  Her last normal  Pap smear was March 2007.  She denies any history of sexually  transmitted diseases.   PAST MEDICAL HISTORY:  Positive for:  1. Asthma.  2. Irritable bowel syndrome.   PAST SURGICAL HISTORY:  Negative.   FAMILY HISTORY:  Cervical cancer, hypertension, diabetes mellitus.   HABITS:  She does not use alcohol or tobacco.   SOCIAL HISTORY:  The patient is married, and she works as a Geologist, engineering.   CURRENT MEDICATIONS:  Albuterol inhaler.   ALLERGIES:  1. PEANUTS cause her to have a rash.  2. The patient is sensitive to MORPHINE.  It causes her to vomit.   REVIEW OF SYSTEMS:  Seasonal allergies.  The patient wears glasses.  She  is hirsute.  She has eczematous skin rashes.  She denies any chest pain,  shortness of breath, headache, vision changes.  Except as mention in  History of Present Illness, the patient has a negative Review of  Systems.   PHYSICAL EXAMINATION:  VITAL SIGNS: Blood pressure 122/80.  Height is 5  feet 5 inches tall.  Weight is 271 pounds.  NECK:  Supple without mass.  There is no thyromegaly or cervical  adenopathy.  HEART:  Regular rate and rhythm.  LUNGS:  Clear.  There are no wheezes, rales, or rhonchi.  BACK:  No CVA tenderness.  ABDOMEN:  Tender in both lower quadrants,  left greater than right, with  voluntary guarding.  However, there is no rebound, masses, or  organomegaly.  EXTREMITIES:  Without clubbing, cyanosis, or edema.  PELVIC:  EG/BUS within normal limits.  The patient does have  tenderness  in her left inguinal area, though there is no adenopathy.  Vagina is  normal. Cervix is nontender without lesions.  Uterus appears normal  size, shape, and consistency without tenderness; however, the patient's  exam is limited by body habitus.  Adnexa:  There are no palpable masses.  There is adnexal tenderness, left greater than right.   IMPRESSION:  1. Pelvic pain.  2. History of left ovarian cyst, questionable dermoid.   DISPOSITION:  A discussion was held with patient regarding indicationis  for her procedure along with its risks which include but are not limited  to reaction to anesthesia, damage to adjacent organs, infection, and  excessive bleeding.  The patient verbalizes understanding of these risks  and wishes to proceed with a diagnostic laparoscopy and possibility of a  left ovarian cystectomy at [facility] on Oct 07, 2006.      Elmira J. Adline Peals.      Janine Limbo, M.D.  Electronically Signed    EJP/MEDQ  D:  10/06/2006  T:  10/06/2006  Job:  [id number]

## 2010-10-12 NOTE — Op Note (Signed)
NAMEMarland Kitchen  Victoria [name], Victoria [name]              ACCOUNT NO.:  [id number]   MEDICAL RECORD NO.:  [id number]          PATIENT TYPE:  AMB   LOCATION:  SDC                           [facility]   PHYSICIAN:  Janine Limbo, M.D.DATE OF BIRTH:  06/23/1980   DATE OF PROCEDURE:  10/07/2006  DATE OF DISCHARGE:                               OPERATIVE REPORT   PREOPERATIVE DIAGNOSIS:  1. Pelvic pain.  2. Left ovarian dermoid cyst.  3. Obesity (weight 271 pounds, height 5 feet 5 inches).   POSTOPERATIVE DIAGNOSIS:  1. Pelvic pain.  2. Left ovarian dermoid cyst.  3. Obesity (weight 271 pounds, height 5 feet 5 inches).  4. Rule out endometriosis.   PROCEDURE:  1. Diagnostic laparoscopy.  2. Laparoscopic left ovarian cystectomy.  3. Laparoscopic pelvic biopsies.   SURGEON:  Dr. Leonard Schwartz   FIRST ASSISTANT:  None.   ANESTHETIC:  Is general.   DISPOSITION:  Victoria [name] is a 30 year old female, gravida 0, who  presents with the above-mentioned diagnosis.  The patient understands  the indications for her surgical procedure and she accepts the risks of,  but not limited to, anesthetic complications, bleeding, infection, and  possible damage to surrounding organs.  She understands that no  guarantees can be given concerning the total relief of her pelvic  discomfort.   FINDINGS:  The uterus was normal size, shape and consistency.  The  fallopian tubes were normal bilaterally.  The fimbriated ends of the  fallopian tubes were delicate.  There was a 8-cm x 4-cm dermoid cyst in  the left ovary.  The right ovary appeared normal.  There was a  hyperpigmented lesion in the posterior cul-de-sac on the left  uterosacral ligament.  This was thought to be consistent with  endometriosis.  It measured approximately 0.5 cm in size.  The appendix,  bowel, anterior cul-de-sac, and pelvic sidewall appeared normal.  The  liver and the gallbladder appeared normal.  There were some filmy  adhesions  between the large bowel and the right pelvic sidewall.   PROCEDURE:  The patient was taken to the operating room where general  anesthetic was given.  The patient's abdomen, perineum, and vagina were  prepped with multiple layers of Betadine.  A Foley catheter was placed  in the bladder.  The patient examination under anesthesia was performed.  A Hulka tenaculum was placed inside the uterus.  The patient was then  sterilely draped.  The umbilical area was injected with 7 mL of half  percent Marcaine with epinephrine.  A 3 cm incision was made above the  umbilicus.  The incision was extended sharply through the subcutaneous  tissue, fascia, and the anterior peritoneum.  Care was taken not to  damage any of the internal structures.  The Hassan cannula was sutured  into place.  A pneumoperitoneum was then obtained.  The laparoscope was  inserted and the pelvis was carefully inspected.  Two areas of the lower  abdomen were injected with an additional 4 mL of half percent Marcaine  with epinephrine.  Two incisions were made and two 5 mm  trocars were  placed into the lower abdomen under direct visualization.  Again care  was taken not to damage any of the internal structures.  Pictures were  taken of the patient's pelvic and abdominal anatomy.  The left ovary was  elevated into our operative field.  An incision was made in the capsule  of the left ovary and the dermoid cyst was sharply and bluntly dissected  from the underlying ovarian tissue.  The cyst was removed without  rupturing.  A 5-mm laparoscope was then placed in one of the lower  abdominal trocars.  The laparoscope was removed from the umbilical port  and the endo-bag was placed in the [facility] cannula.  The intact dermoid  cyst was then placed in the Endobag.  The bag was brought up to the  umbilical incision.  The Hassan cannula was removed and the bag was  partially brought through the incision.  The cyst was too large to fit   through the umbilical incision and for that reason the cyst was ruptured  and the contents of the cysts were then evacuated.  This allowed the bag  to fit through the umbilical incision.  The Hassan cannula was then once  again sutured into place and the pneumoperitoneum was reestablished.  The pelvis was irrigated and the irrigation fluid was aspirated.  Hemostasis was noted to be adequate at the left ovary.  We then turned  our attention to the hyperpigmented lesions in the posterior cul-de-sac.  The biopsy instrument was inserted into the abdominal cavity and  biopsies were obtained from the hyperpigmented area for definitive  diagnosis.  Hemostasis was noted to be adequate.  At this point we felt  that our procedure was completed.  We carefully inspected the pelvic  organs and there was no evidence of damage.  This included inspection of  the bowel.  The lower abdominal trocars were removed under direct  visualization.  The pneumoperitoneum was allowed to escape.  Hemostasis  was confirmed throughout.  The Hassan cannula was removed.  The fascia  at the umbilicus was closed using figure-of-eight sutures of 0 Vicryl.  The subcutaneous layer was closed using 2-0 Vicryl.  3-0 Monocryl was  used to close all skin incisions.  Sponge, needle, instrument counts  were correct on two occasions.  The estimated blood loss for the  procedure was 10 mL or less.  The patient tolerated her procedure well.  The Foley catheter was removed.  The Hulka tenaculum was removed from  the uterus.  The patient did receive 30 mg of Toradol intravenously and  30 mg of Toradol intramuscularly 30 minutes prior to the end of our  procedure.  The patient was awakened from her anesthetic without  difficulty and she was taken to the recovery room in stable condition.  She tolerated her procedure well.  The dermoid cyst was sent to  pathology as was the biopsy from the posterior cul-de-sac.  FOLLOW-UP INSTRUCTIONS:   The patient will take ibuprofen 600 mg every 6  hours as needed for mild to moderate pain.  She can take Vicodin 1-2  tablets every 4 hours as needed for severe pain.  She will call for  questions or concerns.  She was given a copy of the postoperative  instruction sheet as prepared by the [facility] for  patients who have undergone laparoscopy.  She will return to the office  in 2-3 weeks for follow-up examination.  She will return to work  no  later than Monday May 19.      Janine Limbo, M.D.  Electronically Signed     AVS/MEDQ  D:  10/07/2006  T:  10/07/2006  Job:  [id number]

## 2010-10-12 NOTE — H&P (Signed)
Victoria [name], Victoria [name]                ACCOUNT NO.:  [id number]   MEDICAL RECORD NO.:  [id number]          PATIENT TYPE:  INP   LOCATION:  2315                         FACILITY:  [facility]   PHYSICIAN:  Melissa L. Ladona Ridgel, MD  DATE OF BIRTH:  [birth year]   DATE OF ADMISSION:  04/22/2005  DATE OF DISCHARGE:                                HISTORY & PHYSICAL   CHIEF COMPLAINT:  Shortness of breath and cough x2 days.   PRIMARY CARE PHYSICIAN:  Gretta Arab. Valentina Lucks, M.D.   HISTORY OF PRESENT ILLNESS:  The patient is a 30 year old African-American  female who has had URI symptoms on and off x4 months.  She states that the  symptoms generally come and go, but she has not been symptom free for the  whole 4 months.  She states that 2 days ago she started coughing up clear  liquid, which progressed on to thick yellow-green sputum with increasing  shortness of breath over the course of the 2 days.  She has had decreased  appetite and tried to treat herself with Tylenol Cold and fluids, as well as  Mucinex, which was given to her at work.  She is having no nausea, no  vomiting, no diarrhea, but with the increasing shortness of breath, the  patient was unable to lay down last night, and was getting up short of  breath, so she came to the emergency room for further evaluation.   In the emergency room, the patient was treated with nebulizers, supportive  O2.  She actually was placed on a continuous nebulizer for the past 2 hours  with finally some results.  She was given Vicodin for chest discomfort, and  given azithromycin, as well as Rocephin.  She did not receive any steroids  prior to my seeing her.   REVIEW OF SYSTEMS:  As per HPI.  She did have some PND and some orthopnea.  She has had a 10 pound weight loss secondary to what she calls stress in the  past couple of months.  She also complains of a sore throat and stated some  chest discomfort.  All other review of systems are negative.   PAST MEDICAL  HISTORY:  Asthma.  She has never been intubated.   PAST SURGICAL HISTORY:  She has had none.   SOCIAL HISTORY:  She does not smoke.  She does not drink.  She is a Forensic psychologist.  Her last menstrual period was March 28, 2005, and at this  time she is feeling a little bit crampy.  She has never been pregnant and  has no children.   FAMILY HISTORY:  Mom is living with hypothyroidism.  Dad is living with  diabetes, hypertension, and hypercholesterolemia.   ALLERGIES:  No known drug allergies, but she is allergic to PEANUTS.   MEDICATIONS:  None, except for what she started to treat herself with over  the counter.   PHYSICAL EXAMINATION:  VITAL SIGNS:  temperature of 98.9, blood pressure  122/63, pulse 105/119, respirations 16-28, saturation 100 on a Venturi mask.  GENERAL:  This is a  morbidly obese African-American female in moderate  distress secondary to shortness of breath and chest discomfort.  HEENT:  She is normocephalic and atraumatic.  Pupils equal, round and  reactive to light.  Extraocular muscles are intact.  Mucous membranes are  moist.  NECK:  Supple.  There is no JVD, no lymph nodes, no carotid bruits.  CHEST:  Her chest at this time shows no rhonchi, rales, and no wheezes, but  she is decreased bilateral due to the large body habitus, as well, which  compounds her breath sounds.  The patient does have mild tachypnea, but is  able to speak in full sentences.  CARDIOVASCULAR:  She is tachycardic.  Positive S1 and S2.  No S3 or S4.  No  murmurs, rubs, or gallops.  ABDOMEN:  Obese, nondistended.  Minimally diffusely tender, more in the  lower pelvic area secondary to cramping from her possible onset of menses.  EXTREMITIES:  No clubbing, cyanosis, or edema.  NEUROLOGIC:  She is awake, alert, oriented.  Cranial nerves II-XII are  intact.  Power is 5/5.  Deep tendon reflexes are 1+.   PERTINENT LABORATORY VALUES:  White count is 7.5, hemoglobin 12.5,  hematocrit  36.8, platelets 314.  Sodium is 134, potassium 3.6, chloride 109,  CO2 of 16, BUN 6, creatinine 0.8, and glucose is 188.  Her LFT's are within  normal limits.  Her BNP is less than 30.  Her D-dimer is within normal  limits at 0.25.  EKG shows sinus tachycardia with a heart rate of 107.  No  ST-T wave changes are noted.   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  The  patient has had 4 months of on and off upper respiratory infection symptoms.  Her past medical history is significant for asthma without a history of  intubation.  The patient did not respond to standard treatment in the  emergency room, and therefore she required continuous nebulizers q.2h.  She  will be admitted to the step-down unit for more aggressive pulmonary toilet.  1.  Pulmonary.  Bronchitis versus early pneumonia.  The patient will      complete her continuous nebulizer, and then treated with albuterol q.2h.      and p.r.n.  I will start her on steroids and hold the Atrovent because      of her peanut allergy.  Will check an ABG in the morning.  Will need a      BiPAP ordered for p.r.n. if the patient continues to feel significantly      fatigued, and we will attempt to perform aggressive pulmonary toilet.      We have further added Humibid.  2.  Cardiovascular.  She has sinus tachycardia secondary to the albuterol.      There is no evidence of congestive heart failure.  BNP is within normal      limits.  Will check a PA and lateral chest x-ray to get a better      visualization, since her one-view x-ray showed patchy bilateral      infiltrates suggestive of possible edema.  3.  Gastrointestinal.  She will be started on clears and advanced as      tolerated.  Will use a proton pump inhibitor while she is on steroids. 4.  Genitourinary.  Will check a UA culture and sensitivity.  5.  Endocrine.  Will check  blood  sugars and use sliding scale insulin while      on steroids, as well as check a hemoglobin A1C.  6.  Deep vein thrombosis prophylaxis with Lovenox.  7.  ID.  The patient is okay to continue on ceftriaxone and azithromycin,      but I would like to add vancomycin because of her exposures at the      nursing home to cover for more atypical staph.  Will check blood      cultures and sputum cultures.      Melissa L. Ladona Ridgel, MD  Electronically Signed     MLT/MEDQ  D:  04/22/2005  T:  04/22/2005  Job:  [id number]   cc:   Gretta Arab. Valentina Lucks, M.D.  Fax: [phone]

## 2010-10-12 NOTE — Discharge Summary (Signed)
Victoria [name], Victoria [name]                ACCOUNT NO.:  [id number]   MEDICAL RECORD NO.:  [id number]          PATIENT TYPE:  INP   LOCATION:  3738                         FACILITY:  [facility]   PHYSICIAN:  Sherin Quarry, MD      DATE OF BIRTH:  November 13, 1980   DATE OF ADMISSION:  04/21/2005  DATE OF DISCHARGE:  04/25/2005                                 DISCHARGE SUMMARY   Victoria [name] is a 30 year old lady who initially presented to [facility] on April 22, 2005, with a two-day history of worsening  productive cough associated with shortness of breath and wheezing. In the  emergency room she was treated with nebulizers, oxygen, Vicodin for chest  pain, Zithromax, and Rocephin. In spite of these measures, her wheezing  persisted and, therefore, Dr. Derenda Mis was asked to see the patient.   On physical exam, her temperature was 98.9, blood pressure 122/63,  respirations 24, pulse 110. HEENT exam was within normal limits. Examination  of the chest revealed markedly decreased breath sounds as well as prolonged  expiratory phase and expiratory wheezing. The wheezing was fairly faint  probably because of poor air movement. Cardiovascular exam revealed  tachycardia. The abdomen was obese, with normal bowel sounds. No masses or  tenderness. Examination of the extremities revealed no evidence of cyanosis  or edema. Her hemoglobin was 12.5. Sodium 134, potassium 3.6, albumin 0.8,  BUN 6, glucose 188.  BNP was less than 30. D-dimer was 0.25.  EKG revealed a  sinus tachycardia with heart rate of about 110. A chest x-ray showed no  infiltrates. On admission, Dr. Ladona Ridgel placed the patient on IV normal saline  at 75 mL per hour. 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 She was given Solu-Medrol  60 mg IV q.6h.  Oxygen was administered via nasal cannula. Subsequently, blood cultures were  obtained and one of these grew a gram-positive coccus. In light of this  vancomycin was continued. However, subsequently this was shown to be a  coagulase negative staph probably representing a contaminant. The other  blood culture was negative. Urine cultures were negative. ANC hemoglobin was  5.6. The patient's hospital course was one of very gradual improvement. She  experienced significant increase in her blood sugar presumably secondary to  steroids with blood sugars running in the range of 100 to 300. By November  29th her steroids were switched to p.o. prednisone and her condition  continued to improve. By November 30th her respiratory status was doing  great, she was having no respiratory distress, no wheezing or coughing. She  was not short of breath with activity. I expressed concern to her about her  blood sugar regulation and advised her that perhaps the best of action would  be to keep her in the hospital until her blood sugars had come down as we  taper her prednisone. However, she  was extremely eager to go home.  Therefore, on November 30th she was discharged.   DISCHARGE DIAGNOSES:  1.  Asthma with asthmatic bronchitis.  2.  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  3.  Morbid obesity.   DISCHARGE MEDICATIONS:  The patient will be advised to continue 15 units of  Lantus insulin at bedtime daily. She will take Zithromax 250 mg daily for  three additional days and Ceftin 500 mg b.i.d. for five additional days. She  will take prednisone 30 times three, 20 times three, 10 times three, and  then stop. She was instructed to see Dr. Valentina Lucks back in the office on  Monday. She was further instructed to return to work on Tuesday. I explained  to her  that steroid-induced diabetes would represent a significant risk factor for  developing diabetes later in life and  that it was important for Dr. Valentina Lucks  to monitor her blood sugars regularly. Also emphasized the need to achieve  weight reduction.           ______________________________  Sherin Quarry, MD     SY/MEDQ  D:  04/25/2005  T:  04/25/2005  Job:  [id number]   cc:   Gretta Arab. Valentina Lucks, M.D.  Fax: [phone]

## 2010-10-14 ENCOUNTER — Inpatient Hospital Stay (HOSPITAL_COMMUNITY)
Admission: AD | Admit: 2010-10-14 | Discharge: 2010-10-14 | Disposition: A | Discharge status: Home / Self Care | Payer: PRIVATE HEALTH INSURANCE | Source: Ambulatory Visit | Attending: Obstetrics & Gynecology | Admitting: Obstetrics & Gynecology

## 2010-10-14 DIAGNOSIS — R1031 Right lower quadrant pain: Secondary | ICD-10-CM

## 2010-10-14 DIAGNOSIS — O9989 Other specified diseases and conditions complicating pregnancy, childbirth and the puerperium: Secondary | ICD-10-CM

## 2010-10-14 DIAGNOSIS — O99891 Other specified diseases and conditions complicating pregnancy: Secondary | ICD-10-CM

## 2010-10-17 ENCOUNTER — Encounter: Payer: PRIVATE HEALTH INSURANCE | Admitting: Obstetrics & Gynecology

## 2010-10-17 ENCOUNTER — Encounter: Payer: PRIVATE HEALTH INSURANCE | Admitting: Family Medicine

## 2010-10-17 DIAGNOSIS — O039 Complete or unspecified spontaneous abortion without complication: Secondary | ICD-10-CM

## 2010-10-18 NOTE — Group Therapy Note (Signed)
Victoria [name], Victoria [name]              ACCOUNT NO.:  [id number]  MEDICAL RECORD NO.:  [id number]           PATIENT TYPE:  A  LOCATION:  [facility]                   FACILITY:  [facility]  PHYSICIAN:  Sid Falcon, CNM  DATE OF BIRTH:  Apr 25, 1981  DATE OF SERVICE:  10/17/2010                                 CLINIC NOTE  REASON FOR VISIT:  Followup beta hCG and discussion regarding history of left dermoid cyst.  HISTORY OF PRESENT ILLNESS:  The patient was seen previously in the clinic for a dermoid cyst in October 2011.  The patient was undecided at that time about her plans and uncertain about surgical options.  The patient states that her pain got better after she lost approximately 20 pounds and she does desire future pregnancies, so at this time advised to defer surgical options and try to attempt weight loss again.  In addition, the patient was seen in Maternity Admissions Unit on Sep 27, 2010, diagnosed with a pregnancy.  On ultrasound, nothing was seen in the uterus or the adnexa.  HCG on that day was 99, repeat hCG on Sep 28, 2010 was 82.  Returned to the clinic on Oct 01, 2010 for consultation in which the beta hCG was in the 60s.  The patient went ahead and did methotrexate on Oct 08, 2010.  Ultrasound on that day showed no IUP or adnexal mass.  Repeat beta hCG on May 17 was 51, and on Oct 14, 2010 was 52.  Left dermoid cyst still seen on ultrasound on Sep 27, 2010 measuring approximately 2.2 cm.  On repeat ultrasound on Oct 08, 2010, it was not noted whether the dermoid cyst was still present.  The patient states that she still has left-sided pain but desires to continue to take ibuprofen for management.  PHYSICAL EXAMINATION:  GENERAL:  The patient is alert and oriented x3. PELVIS:  Pain on left adnexa with palpation.  The patient states that it is not increased since prior diagnosis of dermoid cyst.  On speculum exam, cervix well visualized.  No abnormalities.  Negative  cervical motion tenderness.  ASSESSMENT: 1. Pregnancy, unknown location. 2. History of left dermoid cyst.  PLAN:  Repeat beta hCG today.  The patient has an appointment scheduled for Saturday for repeat beta hCG.  Discussed with the patient at length various methods for weight loss including nutritional changes and increase in the amount of exercise.  The patient will definitely follow up for the beta hCG followup and then for the dermoid cyst.  She wants to schedule a followup appointment in 6 months to reassess where her late weight loss has gone and then discuss further surgical options if this has not decreased.     Sid Falcon, CNM    WM/MEDQ  D:  10/17/2010  T:  10/18/2010  Job:  [id number]

## 2010-10-20 ENCOUNTER — Inpatient Hospital Stay (HOSPITAL_COMMUNITY)
Admission: AD | Admit: 2010-10-20 | Discharge: 2010-10-20 | Disposition: A | Discharge status: Home / Self Care | Payer: PRIVATE HEALTH INSURANCE | Source: Ambulatory Visit | Attending: Obstetrics and Gynecology | Admitting: Obstetrics and Gynecology

## 2010-10-20 DIAGNOSIS — O00109 Unspecified tubal pregnancy without intrauterine pregnancy: Secondary | ICD-10-CM

## 2010-10-24 ENCOUNTER — Other Ambulatory Visit: Payer: PRIVATE HEALTH INSURANCE

## 2010-10-24 DIAGNOSIS — Z0189 Encounter for other specified special examinations: Secondary | ICD-10-CM

## 2010-10-27 ENCOUNTER — Inpatient Hospital Stay (HOSPITAL_COMMUNITY)
Admission: EM | Admit: 2010-10-27 | Discharge: 2010-10-27 | Disposition: A | Discharge status: Home / Self Care | Payer: PRIVATE HEALTH INSURANCE | Source: Ambulatory Visit | Attending: Obstetrics & Gynecology | Admitting: Obstetrics & Gynecology

## 2010-10-27 DIAGNOSIS — O00109 Unspecified tubal pregnancy without intrauterine pregnancy: Secondary | ICD-10-CM | POA: Insufficient documentation

## 2010-12-05 ENCOUNTER — Other Ambulatory Visit: Payer: Self-pay | Admitting: Obstetrics and Gynecology

## 2011-01-07 ENCOUNTER — Emergency Department (HOSPITAL_COMMUNITY)
Admission: EM | Admit: 2011-01-07 | Discharge: 2011-01-07 | Disposition: A | Discharge status: Home / Self Care | Payer: PRIVATE HEALTH INSURANCE | Attending: Emergency Medicine | Admitting: Emergency Medicine

## 2011-01-07 ENCOUNTER — Emergency Department (HOSPITAL_COMMUNITY)
Admission: EM | Admit: 2011-01-07 | Discharge: 2011-01-08 | Disposition: A | Discharge status: Home / Self Care | Payer: PRIVATE HEALTH INSURANCE | Attending: Emergency Medicine | Admitting: Emergency Medicine

## 2011-01-07 DIAGNOSIS — R42 Dizziness and giddiness: Secondary | ICD-10-CM | POA: Insufficient documentation

## 2011-01-07 DIAGNOSIS — J020 Streptococcal pharyngitis: Secondary | ICD-10-CM | POA: Insufficient documentation

## 2011-01-07 DIAGNOSIS — E119 Type 2 diabetes mellitus without complications: Secondary | ICD-10-CM | POA: Insufficient documentation

## 2011-01-07 DIAGNOSIS — I10 Essential (primary) hypertension: Secondary | ICD-10-CM | POA: Insufficient documentation

## 2011-01-07 DIAGNOSIS — R51 Headache: Secondary | ICD-10-CM | POA: Insufficient documentation

## 2011-01-07 DIAGNOSIS — Z79899 Other long term (current) drug therapy: Secondary | ICD-10-CM | POA: Insufficient documentation

## 2011-01-07 DIAGNOSIS — E669 Obesity, unspecified: Secondary | ICD-10-CM | POA: Insufficient documentation

## 2011-01-07 DIAGNOSIS — J45909 Unspecified asthma, uncomplicated: Secondary | ICD-10-CM | POA: Insufficient documentation

## 2011-01-07 DIAGNOSIS — R599 Enlarged lymph nodes, unspecified: Secondary | ICD-10-CM | POA: Insufficient documentation

## 2011-01-07 DIAGNOSIS — E860 Dehydration: Secondary | ICD-10-CM | POA: Insufficient documentation

## 2011-01-22 ENCOUNTER — Encounter: Payer: Self-pay | Admitting: Family Medicine

## 2011-01-29 ENCOUNTER — Encounter: Payer: Self-pay | Admitting: Medical

## 2011-01-29 ENCOUNTER — Ambulatory Visit (INDEPENDENT_AMBULATORY_CARE_PROVIDER_SITE_OTHER): Payer: PRIVATE HEALTH INSURANCE | Admitting: Medical

## 2011-01-29 VITALS — BP 140/100 | HR 64 | Temp 98.2°F | Resp 20 | Ht 66.0 in | Wt 300.0 lb

## 2011-01-29 DIAGNOSIS — L0390 Cellulitis, unspecified: Secondary | ICD-10-CM

## 2011-01-29 DIAGNOSIS — L0291 Cutaneous abscess, unspecified: Secondary | ICD-10-CM

## 2011-01-29 MED ORDER — DOXYCYCLINE HYCLATE 100 MG PO TABS: 100.0000 mg | ORAL_TABLET | Freq: Two times a day (BID) | ORAL | Status: AC

## 2011-01-29 NOTE — Patient Instructions (Signed)
Cellulitis Cellulitis is an infection of the skin and the tissue beneath it. The area is typically red and tender. It is caused by germs (bacteria) (usually staph or strep) that enter the body through cuts or sores. Cellulitis most commonly occurs in the arms or lower legs.  HOME CARE INSTRUCTIONS  If you are given a prescription for medications which kill germs (antibiotics), take as directed until finished.   If the infection is on the arm or leg, keep the limb elevated as able.   Use a warm cloth several times per day to relieve pain and encourage healing.   See your caregiver for recheck of the infected site in 2-3 day or sooner if problems arise.   Only take over-the-counter or prescription medicines for pain, discomfort, or fever as directed by your caregiver.  SEEK MEDICAL CARE IF:  An oral temperature above 101 develops, not controlled by medication.   The area of redness (inflammation) is spreading, there are red streaks coming from the infected site, or if a part of the infection begins to turn dark in color.   The joint or bone underneath the infected skin becomes painful after the skin has healed.   The infection returns in the same or another area after it seems to have gone away.   A boil or bump swells up. This may be an abscess.   New, unexplained problems such as pain or fever develop.  SEEK IMMEDIATE MEDICAL CARE IF:  You or your child feels drowsy or lethargic.   There is vomiting, diarrhea, or lasting discomfort or feeling ill (malaise) with muscle aches and pains.  MAKE SURE YOU:   Understand these instructions.   Will watch your condition.   Will get help right away if you are not doing well or get worse.  Document Released: 02/20/2005 Document Re-Released: 03/10/2009 [facility] Patient Information 2011 [place], [place].

## 2011-01-29 NOTE — Progress Notes (Signed)
Subjective:    Victoria [name] is a 30 y.o. female who presents for evaluation of a possible skin infection located right hip x 2 days. Symptoms include pain in same area. Patient denies fever, chills, drainage. Precipitating event: felt a bite possibly while driving in her car 2 days ago. Treatment to date has included none  Denies hx/o MRSA, abscess, cellulitis or similar.   The following portions of the patient's history were reviewed and updated as appropriate: allergies, current medications, past family history, past medical history, past social history, past surgical history and problem list.  Past Medical History  Diagnosis Date  . Diabetes mellitus   . Asthma   . Obesity   . Hypertension   . Dermoid cyst     LEFT OVARY    Review of Systems Gen: no chills, fever GI: no abdominal pain, N/V/D MSK: no joint pain or swelling    Objective:    Filed Vitals:   01/29/11 1201  BP: 140/100  Pulse: 64  Temp: 98.2 F (36.8 C)  Resp: 20    General appearance: alert, no distress, WD/WN, black female, obese  Skin: right hip with 2mm round flat crusted lesion and 3cm underlying indurated area, warm, but no drainage or fluctuance; exam chaperoned by nurse      Assessment:    Cellulitis of the right hip/superficial.    Plan:    doxycycline prescribed. Agricultural engineer distributed. Pain medication: OTC Aleve or Tylenol. Follow up in 2-3 days for wound check.    Discussed signs of worsening infection.  If not improving in 2-3 days, return.

## 2011-02-14 ENCOUNTER — Encounter (HOSPITAL_COMMUNITY): Payer: Self-pay | Admitting: *Deleted

## 2011-02-14 ENCOUNTER — Inpatient Hospital Stay (HOSPITAL_COMMUNITY): Payer: PRIVATE HEALTH INSURANCE

## 2011-02-14 ENCOUNTER — Inpatient Hospital Stay (HOSPITAL_COMMUNITY)
Admission: AD | Admit: 2011-02-14 | Discharge: 2011-02-14 | Disposition: A | Discharge status: Home / Self Care | Payer: PRIVATE HEALTH INSURANCE | Source: Ambulatory Visit | Attending: Obstetrics and Gynecology | Admitting: Obstetrics and Gynecology

## 2011-02-14 DIAGNOSIS — R51 Headache: Secondary | ICD-10-CM | POA: Insufficient documentation

## 2011-02-14 DIAGNOSIS — R109 Unspecified abdominal pain: Secondary | ICD-10-CM

## 2011-02-14 LAB — URINALYSIS, ROUTINE W REFLEX MICROSCOPIC
Glucose, UA: NEGATIVE mg/dL
Hgb urine dipstick: NEGATIVE
Specific Gravity, Urine: 1.025 (ref 1.005–1.030)

## 2011-02-14 LAB — COMPREHENSIVE METABOLIC PANEL
ALT: 12 U/L (ref 0–35)
AST: 15 U/L (ref 0–37)
Albumin: 3.4 g/dL — ABNORMAL LOW (ref 3.5–5.2)
Calcium: 9.3 mg/dL (ref 8.4–10.5)
Creatinine, Ser: 0.56 mg/dL (ref 0.50–1.10)
Sodium: 138 mEq/L (ref 135–145)

## 2011-02-14 LAB — WET PREP, GENITAL
Clue Cells Wet Prep HPF POC: NONE SEEN
WBC, Wet Prep HPF POC: NONE SEEN
Yeast Wet Prep HPF POC: NONE SEEN

## 2011-02-14 LAB — CBC
Hemoglobin: 11.9 g/dL — ABNORMAL LOW (ref 12.0–15.0)
MCH: 26.2 pg (ref 26.0–34.0)
MCV: 78.6 fL (ref 78.0–100.0)
Platelets: 276 10*3/uL (ref 150–400)
RBC: 4.54 MIL/uL (ref 3.87–5.11)
WBC: 8.1 10*3/uL (ref 4.0–10.5)

## 2011-02-14 LAB — POCT PREGNANCY, URINE: Preg Test, Ur: NEGATIVE

## 2011-02-14 MED ORDER — KETOROLAC TROMETHAMINE 60 MG/2ML IM SOLN
60.0000 mg | Freq: Once | INTRAMUSCULAR | Status: AC
  Administered 2011-02-14: 60 mg via INTRAMUSCULAR
  Filled 2011-02-14: qty 2

## 2011-02-14 NOTE — ED Provider Notes (Signed)
History     No chief complaint on file.  HPI  Pt is not pregnant(neg urine pregnancy test in [facility]) with LMP 9/9(one week late lasting 3 days)  and has abdominal pain and cramping with pressure when sits up for 1 month . The pain is worse at night and after she eats.  The pain is upper abdomen and lower bilateral abdomen.  The pain does not bother her when she walks.   She has a history of left dermoid  ovarian cyst with surgery 2008(Dr. Stefano Gaul) and then was told she had a 2cm cyst after her son was born([facility]) and after her miscarriage in May 2012([facility])  She saw Nestor Ramp 2 mos after her miscarriage which was treated with MTX (?tubal); she was followed until July 2012 with BetaHCGs.  She denies constipation or diarrhea.  She denies pain with urination. She says she has been nauseated and unable to eat anything for 1 week.  She has had a clear discharge without odor or itching.  She last had intercourse 4 days ago without pain.   She is not using anything for birth control.   She is diabetic on Metformin- she has not been told she was PCO.  Past Medical History  Diagnosis Date  . Diabetes mellitus   . Asthma   . Obesity   . Dermoid cyst     LEFT OVARY  . Hypertension     with miscarriage    Past Surgical History  Procedure Date  . Dermoid cyst removal   . Cesarean section 2009    Family History  Problem Relation Age of Onset  . Hypertension Mother   . Hypertension Father   . Diabetes Father   . Asthma Father   . Diabetes Sister     History  Substance Use Topics  . Smoking status: Never Smoker   . Smokeless tobacco: Never Used  . Alcohol Use: No    Allergies:  Allergies  Allergen Reactions  . Dilaudid (Hydromorphone Hcl) Hives  . Morphine And Related Hives  . Peanut-Containing Drug Products Hives    Prescriptions prior to admission  Medication Sig Dispense Refill  . albuterol (PROVENTIL) (2.5 MG/3ML) 0.083% nebulizer solution Take 2.5 mg by nebulization every  6 (six) hours as needed. Shortness of breath      . metFORMIN (GLUCOPHAGE-XR) 500 MG 24 hr tablet Take 500 mg by mouth daily with breakfast.       . PRENATAL VITAMINS PO Take 1 tablet by mouth daily.       Marland Kitchen losartan (COZAAR) 25 MG tablet Take 25 mg by mouth daily.         Review of Systems  Constitutional: Negative for fever.  Eyes: Negative for blurred vision.  Gastrointestinal: Positive for nausea and abdominal pain. Negative for diarrhea and constipation.  Genitourinary: Negative for dysuria and urgency.  Neurological: Positive for headaches.   Physical Exam   Blood pressure 150/111, pulse 76, temperature 97.6 F (36.4 C), temperature source Oral, resp. rate 20, height 5\' 4"  (1.626 m), weight 299 lb (135.626 kg), last menstrual period 02/03/2011. BP recheck 131/85- pt is known hypertensive on Coozar Physical Exam  Constitutional: She is oriented to person, place, and time. She appears well-developed and well-nourished. No distress.  HENT:  Head: Normocephalic.  Eyes: Pupils are equal, round, and reactive to light.  Neck: Normal range of motion.  Respiratory: Effort normal.  GI: Soft.  Genitourinary: Vagina normal.       Massive obesity  complicating exam; bimanual tender with pt grimacing with bilateral palpation, no rebound; unable to assess adequately  Musculoskeletal: Normal range of motion.  Neurological: She is alert and oriented to person, place, and time.  Skin: Skin is warm and dry.  Psychiatric: She has a normal mood and affect.    MAU Course  Procedures Pelvic exam with inadequate assessment due to habitus; will do transvaginal ultrasound. Pt wanting something for pain- toradol 60mg  IM ordered- not given     *RADIOLOGY REPORT*  Clinical Data: Pelvic pain. LMP 02/03/1999  TRANSVAGINAL ULTRASOUND OF PELVIS  Technique: Transvaginal ultrasound examination of the pelvis was  performed including evaluation of the uterus, ovaries, adnexal  regions, and pelvic  cul-de-sac.  Comparison: 10/08/2010  Findings:  Uterus: Demonstrates a sagittal length of 9.7 cm, an AP depth of  5.2 cm and a transverse width of 5.7 cm. A homogeneous myometrium  is seen  Endometrium: Is tri- layered with an AP width of 1.3 cm.This would  correlate with a periovulatory endometrial stripe and correspond  with the patient's given LMP of 02/03/2011. No areas of focal  thickening or heterogeneity are seen.  Right ovary: Measures 2.4 x 3.0 x 1.9 cm and is poorly resolved  Left ovary: Measures 3.7 x 2.5 x 3.1 cm and is poorly resolved  Other Findings: No pelvic fluid or separate adnexal masses are  seen.  IMPRESSION:  Normal uterine myometrium and periovulatory endometrial stripe.  Ovaries are poorly resolved and incompletely assessed today.  Original Report Authenticated By: Bertha Stakes, M.D.      Imaging    Wet prep, GC Chlamydia CBC, CMET, U/A  Assessment and Plan  Abdominal pain Diabetes  Victoria [name] 02/14/2011, 12:44 PM

## 2011-02-14 NOTE — Progress Notes (Signed)
Past 2 wks, has been having constant abd pain  And headaches, getting worse.  Period came on wk early and was brief.

## 2011-02-14 NOTE — Progress Notes (Signed)
Lower abd pain x 1 month, period came on a week early, HA's x 3 weeks.  Pain escalated after period.  Period was irregular, only lasted 3 days.  Neg HPT.  Also abd pressure & bloating.

## 2011-02-15 LAB — URINALYSIS, ROUTINE W REFLEX MICROSCOPIC
Glucose, UA: NEGATIVE
Nitrite: NEGATIVE
Specific Gravity, Urine: 1.02
pH: 6.5

## 2011-02-15 LAB — FETAL FIBRONECTIN: Fetal Fibronectin: NEGATIVE

## 2011-02-15 LAB — GC/CHLAMYDIA PROBE AMP, GENITAL: Chlamydia, DNA Probe: NEGATIVE

## 2011-02-18 ENCOUNTER — Ambulatory Visit (INDEPENDENT_AMBULATORY_CARE_PROVIDER_SITE_OTHER): Payer: PRIVATE HEALTH INSURANCE | Admitting: Family Medicine

## 2011-02-18 ENCOUNTER — Encounter: Payer: Self-pay | Admitting: Family Medicine

## 2011-02-18 DIAGNOSIS — E119 Type 2 diabetes mellitus without complications: Secondary | ICD-10-CM

## 2011-02-18 DIAGNOSIS — E1129 Type 2 diabetes mellitus with other diabetic kidney complication: Secondary | ICD-10-CM | POA: Insufficient documentation

## 2011-02-18 DIAGNOSIS — J45909 Unspecified asthma, uncomplicated: Secondary | ICD-10-CM

## 2011-02-18 DIAGNOSIS — J45901 Unspecified asthma with (acute) exacerbation: Secondary | ICD-10-CM | POA: Insufficient documentation

## 2011-02-18 DIAGNOSIS — Z0000 Encounter for general adult medical examination without abnormal findings: Secondary | ICD-10-CM

## 2011-02-18 DIAGNOSIS — I10 Essential (primary) hypertension: Secondary | ICD-10-CM

## 2011-02-18 LAB — POCT URINALYSIS DIP (DEVICE)
Bilirubin Urine: NEGATIVE
Glucose, UA: 500 — AB
Ketones, ur: NEGATIVE
Operator id: 297281
Operator id: 15968
Operator id: 159681
Operator id: 297281
Protein, ur: 30 — AB
Protein, ur: 30 — AB
Protein, ur: NEGATIVE
Specific Gravity, Urine: 1.02
Urobilinogen, UA: 0.2
Urobilinogen, UA: 0.2
Urobilinogen, UA: 1
Urobilinogen, UA: 1

## 2011-02-18 LAB — URINALYSIS, ROUTINE W REFLEX MICROSCOPIC
Glucose, UA: NEGATIVE
Glucose, UA: NEGATIVE
Ketones, ur: NEGATIVE
Ketones, ur: NEGATIVE
Nitrite: NEGATIVE
Protein, ur: NEGATIVE
Protein, ur: NEGATIVE

## 2011-02-18 LAB — WET PREP, GENITAL
Clue Cells Wet Prep HPF POC: NONE SEEN
Trich, Wet Prep: NONE SEEN
Yeast Wet Prep HPF POC: NONE SEEN

## 2011-02-18 LAB — POCT GLYCOSYLATED HEMOGLOBIN (HGB A1C): Hemoglobin A1C: 6.3

## 2011-02-18 LAB — URINE CULTURE

## 2011-02-18 LAB — COMPREHENSIVE METABOLIC PANEL
Albumin: 2.7 — ABNORMAL LOW
Alkaline Phosphatase: 125 — ABNORMAL HIGH
BUN: 5 — ABNORMAL LOW
Calcium: 9.3
Potassium: 4
Total Protein: 5.7 — ABNORMAL LOW

## 2011-02-18 LAB — CBC
HCT: 34.7 — ABNORMAL LOW
Platelets: 253
RDW: 15.6 — ABNORMAL HIGH

## 2011-02-18 LAB — LIPID PANEL
Cholesterol: 153 mg/dL (ref 0–200)
VLDL: 19 mg/dL (ref 0–40)

## 2011-02-18 MED ORDER — LABETALOL HCL 100 MG PO TABS: 100.0000 mg | ORAL_TABLET | Freq: Two times a day (BID) | ORAL | Status: DC

## 2011-02-18 MED ORDER — ALBUTEROL SULFATE HFA 108 (90 BASE) MCG/ACT IN AERS: 2.0000 | INHALATION_SPRAY | Freq: Four times a day (QID) | RESPIRATORY_TRACT | Status: DC | PRN

## 2011-02-18 NOTE — Patient Instructions (Addendum)
Have pharmacy send refill request for next glucophage Rx to [place] Flu shot is strongly encouraged, as is the pneumovax Increase exercise to at least 5 days/week. Weight loss is strongly encouraged.  Watch snacks and portion sizes. Please cut back on fast food Restart periodically checking your sugars Have pharmacy request refill on glucose testing strips, or call and tell us the name of your machine/strips  Please check your blood pressure and pulse daily.  Keep track of it on a piece of paper, and bring paper to your f/u visit in 2 weeks  2 Gram Low Sodium Diet A 2 gram sodium diet restricts the amount of sodium in the diet to no more than 2 grams (g) or 2000 milligrams (mg) daily. Limiting the amount of sodium is often used to help lower blood pressure. It is important if you have heart, liver, or kidney problems. Many foods contain sodium for flavor and sometimes as a preservative. When the amount of sodium in a diet needs to be low, it is important to know what to look for when choosing foods and drinks. The following includes some information and guidelines to help make it easier for you to adapt to a low sodium diet. QUICK TIPS  Do not add salt to food.   Avoid convenience items and fast food.   Choose unsalted snack foods.   Buy lower sodium products, often labeled as "lower sodium" or "no salt added."   Check food labels to learn how much sodium is in 1 serving.   When eating at a restaurant, ask that your food be prepared with less salt or none, if possible.  READING FOOD LABELS FOR SODIUM INFORMATION The nutrition facts label is a good place to find how much sodium is in foods. Look for products with no more than 500 to 600 mg of sodium per meal and no more than 150 mg per serving. Remember that 2 g = 2000 mg. The food label may also list foods as:  Sodium-free: Less than 5 mg in a serving.   Very low sodium: 35 mg or less in a serving.   Low-sodium: 140 mg or less in a  serving.   Light in sodium: 50% less sodium in a serving. For example, if a food that usually has 300 mg of sodium is changed to become light in sodium, it will have 150 mg of sodium.   Reduced sodium: 25% less sodium in a serving. For example, if a food that usually has 400 mg of sodium is changed to reduced sodium, it will have 300 mg of sodium.  CHOOSING FOODS AVOID CHOOSE  Grains: Salted crackers and snack items. Some cereals, including instant hot cereals. Bread stuffing and biscuit mixes. Seasoned rice or pasta mixes. Grains: Unsalted snack items. Low-sodium cereals, oats, puffed wheat and rice, shredded wheat. English muffins and bread. Pasta.  Meats: Salted, canned, smoked, spiced, or pickled meats, including fish and poultry. Bacon, ham, sausage, cold cuts, hot dogs, anchovies. Meats: Low-sodium canned tuna and salmon. Fresh or frozen meat, poultry, and fish.  Dairy: Processed cheese and spreads. Cottage cheese. Buttermilk and condensed milk. Regular cheese. Dairy: Milk. Low-sodium cottage cheese. Yogurt. Sour cream. Low-sodium cheese.  Fruits and Vegetables: Regular canned vegetables. Regular canned tomato sauce and paste. Frozen vegetables in sauces. Olives. Rosita Fire. Relishes. Sauerkraut. Fruits and Vegetables: Low-sodium canned vegetables. Low-sodium tomato sauce and paste. Fresh and frozen vegetables. Fresh and frozen fruit.  Condiments: Canned and packaged gravies. Worcestershire sauce. Tartar sauce. Barbecue  sauce. Soy sauce. Steak sauce. Ketchup. Onion, garlic, and table salt. Meat flavorings and tenderizers. Condiments: Fresh and dried herbs and spices. Low-sodium varieties of mustard and ketchup. Lemon juice. Tabasco sauce. Horseradish.  SAMPLE 2 GRAM SODIUM MEAL PLAN Meal Foods Sodium (mg)  Breakfast 1 cup low-fat milk 2 slices whole-wheat toast 1 tablespoon heart-healthy margarine 1 hard-boiled egg 1 small orange  [phone]  Lunch 1 cup raw carrots  cup hummus 1 cup low-fat milk  cup red grapes 1 whole-wheat pita bread [phone]  Dinner 1 cup whole-wheat pasta 1 cup low-sodium tomato sauce 3 ounces lean ground beef 1 small side salad (1 cup raw spinach leaves,  cup cucumber,  cup yellow bell pepper) with 1 teaspoon olive oil and 1 teaspoon red wine vinegar 2 73 [phone] 25  Snack 1 container low-fat vanilla yogurt 3 graham cracker squares 107 127  Nutrient Analysis Calories: 2033 Protein: 77 g Carbohydrate: 282 g Fat: 72 g Sodium: 1971 mg Document Released: 05/13/2005 Document Re-Released: 10/31/2009 [facility] Patient Information 2011 [place], [place].

## 2011-02-18 NOTE — Progress Notes (Signed)
Patient presents for f/u on DM.  Hasn't been checking sugars because ran out of test strips.  Denies hypoglycemia, polydipsia or polyuria.  Had eye exam earlier this year.  Checking feet regularly without any concerns.    HTN--patient previously took Losartan. This was discontinued when she was last seen here, while trying for pregnancy.  She found out in May that she was pregnant, which she ultimately miscarried.  BP's were very high during this time.  OB put her on Labetolol, and BP was improved.  She stopped the medication when she learned she miscarried.  Only took med for about a week.  BP was okay at f/u visits with GYN.  BP last week was 160's/100's.  Has been having some headaches.  She is still trying for pregnancy.  Asthma--needs rescue inhaler about once a week, mainly related high activity (walking track)  Exercising 30 minutes of walking at least 3 times/week  Past Medical History  Diagnosis Date  . Diabetes mellitus 04/2009    type 2  . Asthma   . Obesity   . Dermoid cyst     LEFT OVARY  . Hypertension     Past Surgical History  Procedure Date  . Dermoid cyst removal 2008  . Cesarean section 2009    History   Social History  . Marital Status: Married    Spouse Name: N/A    Number of Children: 1  . Years of Education: N/A   Occupational History  . CNA    Social History Main Topics  . Smoking status: Never Smoker   . Smokeless tobacco: Never Used  . Alcohol Use: No  . Drug Use: No  . Sexually Active: Yes    Birth Control/ Protection: None   Other Topics Concern  . Not on file   Social History Narrative   Lives at home with husband, 57 year old son, mother-in-law.  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 Hypertension Mother   . Hypertension Father   . Diabetes Father   . Asthma Father   . Diabetes Sister    Allergies  Allergen Reactions  . Dilaudid  (Hydromorphone Hcl) Hives  . Morphine And Related Hives  . Peanut-Containing Drug Products Hives   ROS:  Denies fevers, URI symptoms, cough, SOB, chest pain. + headache. Denies GI/GU complaints  PHYSICAL EXAM: BP 142/106  Pulse 80  Ht 5\' 2"  (1.575 m)  Wt 295 lb (133.811 kg)  BMI 53.96 kg/m2  LMP 02/03/2011 Neck: no lymphadenopathy, thyromegaly or carotid bruit Heart: regular rate and rhythm without murmur Lungs: clear bilaterally Abdomen: soft, nontender Extremities: no edema, 2+ pulses Skin: no rash Psych: normal mood, affect, hygiene and grooming  A1c 6.3  ASSESSMENT/PLAN: 1. Routine general medical examination at a health care facility    2. Type II or unspecified type diabetes mellitus without mention of complication, not stated as uncontrolled  POCT HgB A1C, Lipid panel, Glucose, random  3. Essential hypertension, benign  labetalol (NORMODYNE) 100 MG tablet  4. Unspecified asthma  albuterol (PROVENTIL HFA;VENTOLIN HFA) 108 (90 BASE) MCG/ACT inhaler   Diabetes well controlled.  Continue Glucophage.  Getting last refill this week from Dr. Raquel James.  To have pharmacy fax refill request to avoid unintentionally changing type of metformin. Urine microalbumin due again in December 2012.  Encouraged daily exercise, weight loss.  HTN--uncontrolled.  Since she is still trying for pregnancy,  restart labetolol.  May need to titrate up dose.  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  Also declines pneumovax.  F/u 2 weeks on HTN

## 2011-02-19 LAB — POCT URINALYSIS DIP (DEVICE)
Bilirubin Urine: NEGATIVE
Glucose, UA: NEGATIVE
Ketones, ur: NEGATIVE
Operator id: 194561
Specific Gravity, Urine: 1.025

## 2011-02-19 LAB — CBC
HCT: 34 — ABNORMAL LOW
Hemoglobin: 10.1 — ABNORMAL LOW
Hemoglobin: 11.7 — ABNORMAL LOW
MCHC: 34.2
MCV: 79.9
RBC: 3.67 — ABNORMAL LOW
RBC: 4.26
WBC: 8.1
WBC: 8.7

## 2011-02-19 LAB — CCBB MATERNAL DONOR DRAW

## 2011-02-19 LAB — RPR: RPR Ser Ql: NONREACTIVE

## 2011-03-01 LAB — URINE MICROSCOPIC-ADD ON

## 2011-03-01 LAB — POCT I-STAT, CHEM 8
Calcium, Ion: 1.19 mmol/L (ref 1.12–1.32)
Creatinine, Ser: 0.7 mg/dL (ref 0.4–1.2)
Glucose, Bld: 89 mg/dL (ref 70–99)
HCT: 36 % (ref 36.0–46.0)
Hemoglobin: 12.2 g/dL (ref 12.0–15.0)
Potassium: 3.8 mEq/L (ref 3.5–5.1)

## 2011-03-01 LAB — URINALYSIS, ROUTINE W REFLEX MICROSCOPIC
Glucose, UA: NEGATIVE mg/dL
Nitrite: POSITIVE — AB
Specific Gravity, Urine: 1.02 (ref 1.005–1.030)
pH: 7 (ref 5.0–8.0)

## 2011-03-04 ENCOUNTER — Ambulatory Visit: Payer: PRIVATE HEALTH INSURANCE | Admitting: Family Medicine

## 2011-03-05 NOTE — ED Provider Notes (Signed)
Advised that patient should call office for follow up appt.  If pain persists or worsens, patient should return to the ED for evaluation.

## 2011-03-11 ENCOUNTER — Encounter: Payer: Self-pay | Admitting: Family Medicine

## 2011-03-11 ENCOUNTER — Ambulatory Visit (INDEPENDENT_AMBULATORY_CARE_PROVIDER_SITE_OTHER): Payer: PRIVATE HEALTH INSURANCE | Admitting: Family Medicine

## 2011-03-11 VITALS — BP 180/114 | HR 80 | Ht 64.0 in | Wt 300.0 lb

## 2011-03-11 DIAGNOSIS — I10 Essential (primary) hypertension: Secondary | ICD-10-CM

## 2011-03-11 MED ORDER — LABETALOL HCL 100 MG PO TABS: ORAL_TABLET | ORAL | Status: DC

## 2011-03-11 NOTE — Patient Instructions (Addendum)
Immediately take 2 tablets when you return home!! Also take some tylenol for the headache; recheck your blood pressure in an hour or two.  Further titrate up the Labetalol.  Increase to 2 tablets in the morning, 1 tablet in the evening.  If after a week BP's are still >135-140/85-90 then increase to 2 tablets twice daily.  Monitor the pulse.  If pulse is <60, cannot increase the dose further  F/u 2 weeks with BP cuff, and list of BP's

## 2011-03-11 NOTE — Progress Notes (Signed)
Patient presents for f/u on hypertension.  She restarted the labetalol at 100mg  twice daily after last visit.  Denies side effects.  Head felt "funny-feeling" initially, but this resolved.  BP's at home 140-155/92-105. Pulse 77-88.  Had one recent normal BP of 130/77 when she was off from work.  +has a headache today, and hasn't taken her medication yet this morning.   Past Medical History  Diagnosis Date  . Diabetes mellitus 04/2009    type 2  . Asthma   . Obesity   . Dermoid cyst     LEFT OVARY  . Hypertension     Past Surgical History  Procedure Date  . Dermoid cyst removal 2008  . Cesarean section 2009    History   Social History  . Marital Status: Married    Spouse Name: N/A    Number of Children: 1  . Years of Education: N/A   Occupational History  . CNA    Social History Main Topics  . Smoking status: Never Smoker   . Smokeless tobacco: Never Used  . Alcohol Use: No  . Drug Use: No  . Sexually Active: Yes    Birth Control/ Protection: None   Other Topics Concern  . Not on file   Social History Narrative   Lives at home with husband, 70 year old son, mother-in-law.  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 Hypertension Mother   . Hypertension Father   . Diabetes Father   . Asthma Father   . Diabetes Sister    Current Outpatient Prescriptions on File Prior to Visit  Medication Sig Dispense Refill  . albuterol (PROVENTIL HFA;VENTOLIN HFA) 108 (90 BASE) MCG/ACT inhaler Inhale 2 puffs into the lungs every 6 (six) hours as needed for wheezing or shortness of breath.  18 g  1  . albuterol (PROVENTIL) (2.5 MG/3ML) 0.083% nebulizer solution Take 2.5 mg by nebulization every 6 (six) hours as needed. Shortness of breath      . metFORMIN (GLUCOPHAGE-XR) 500 MG 24 hr tablet Take 500 mg by mouth daily with breakfast.       . PRENATAL VITAMINS PO Take 1 tablet by mouth daily.         Marland Kitchen DISCONTD: labetalol (NORMODYNE) 100 MG tablet Take 1 tablet (100 mg total) by mouth 2 (two) times daily.  60 tablet  1    Allergies  Allergen Reactions  . Dilaudid (Hydromorphone Hcl) Hives  . Morphine And Related Hives  . Peanut-Containing Drug Products Hives   ROS:  Denies wheezing, shortness of breath.  Occasionally gets sharp pains in her chest, with certain movements, no exertional chest pain.  No fevers, URI symptoms, edema or other concerns  PHYSICAL EXAM: BP 180/114  Pulse 80  Ht 5\' 4"  (1.626 m)  Wt 300 lb (136.079 kg)  BMI 51.49 kg/m2  LMP 02/03/2011 Pleasant, obese african [place] female in no distress HEENT: PERRL, EOMI, conjunctiva clear.   Heart: regular rate and rhythm without murmur Lungs: clear bilaterally Extremities: no edema Neuro: alert and oriented.  Cranial nerves grossly intact.  Normal gait  ASSESSMENT/PLAN: 1. Essential hypertension, benign  labetalol (NORMODYNE) 100 MG tablet   Uncontrolled HTN today--unsure if headache is causing higher BP or vice versa; likely contributed by her not taking her meds this morning. Further titrate up the Labetalol.  Increase to 2 tablets in the morning, 1 tablet in the  evening.  If after a week BP's are still >135-140/85-90 then increase to 2 tablets twice daily.  Monitor the pulse.  If pulse is <60, cannot increase the dose further  F/u 2 weeks with BP cuff, and list of BP's

## 2011-03-25 ENCOUNTER — Ambulatory Visit: Payer: PRIVATE HEALTH INSURANCE | Admitting: Family Medicine

## 2011-04-01 ENCOUNTER — Ambulatory Visit (INDEPENDENT_AMBULATORY_CARE_PROVIDER_SITE_OTHER): Payer: PRIVATE HEALTH INSURANCE | Admitting: Family Medicine

## 2011-04-01 ENCOUNTER — Encounter: Payer: Self-pay | Admitting: Family Medicine

## 2011-04-01 VITALS — BP 140/100 | HR 72 | Ht 62.0 in | Wt 296.0 lb

## 2011-04-01 DIAGNOSIS — R51 Headache: Secondary | ICD-10-CM

## 2011-04-01 DIAGNOSIS — E119 Type 2 diabetes mellitus without complications: Secondary | ICD-10-CM

## 2011-04-01 DIAGNOSIS — I10 Essential (primary) hypertension: Secondary | ICD-10-CM

## 2011-04-01 MED ORDER — LABETALOL HCL 200 MG PO TABS: 200.0000 mg | ORAL_TABLET | Freq: Two times a day (BID) | ORAL | Status: DC

## 2011-04-01 NOTE — Patient Instructions (Addendum)
Continue to monitor blood pressure. Continue to follow a low sodium diet.  Try and exercise daily, and lose weight in order to achieve goal blood pressures without having to adjust medications further.  Try changing metformin to taking in the morning.  Please call us if you don't hear anything within a week about sleep study

## 2011-04-01 NOTE — Progress Notes (Signed)
Patient presents for f/u on blood pressure.  Has increased the Labetolol to 2 tablets twice daily, and BP's are running 132-140/82-92.  Pulse in 80's.  Initially had slight funny-feeling in her head with the increased dose, but very short-lived.  No longer having any side effects.  Denies any shortness of breath, dizziness.   Gets headaches after taking her metformin.  Takes metformin at night, and would wake up with headache.  She stopped taking metformin for a few days, and morning headaches were gone.  She went back to taking medication, and headaches are back.  Seemed to tolerate it in the past when she only took it a few times a week, but headaches started when she began taking the medication daily, as directed.  She is out of test strips, so hasn't been able to check her sugars.   Husband says she snores, and she also reports waking up gasping for air. + am headaches (intermittently).  Sometimes has fatigue throughout the day.  Doesn't feel well-rested in mornings, but admits to only getting 5 hours of sleep/night.  Past Medical History  Diagnosis Date  . Diabetes mellitus 04/2009    type 2  . Asthma   . Obesity   . Dermoid cyst     LEFT OVARY  . Hypertension     Past Surgical History  Procedure Date  . Dermoid cyst removal 2008  . Cesarean section 2009    History   Social History  . Marital Status: Married    Spouse Name: N/A    Number of Children: 1  . Years of Education: N/A   Occupational History  . CNA    Social History Main Topics  . Smoking status: Never Smoker   . Smokeless tobacco: Never Used  . Alcohol Use: No  . Drug Use: No  . Sexually Active: Yes    Birth Control/ Protection: None   Other Topics Concern  . Not on file   Social History Narrative   Lives at home with husband, 19 year old son, mother-in-law.  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 Hypertension  Mother   . Hypertension Father   . Diabetes Father   . Asthma Father   . Diabetes Sister     Current outpatient prescriptions:albuterol (PROVENTIL HFA;VENTOLIN HFA) 108 (90 BASE) MCG/ACT inhaler, Inhale 2 puffs into the lungs every 6 (six) hours as needed for wheezing or shortness of breath., Disp: 18 g, Rfl: 1;  albuterol (PROVENTIL) (2.5 MG/3ML) 0.083% nebulizer solution, Take 2.5 mg by nebulization every 6 (six) hours as needed. Shortness of breath, Disp: , Rfl:  labetalol (NORMODYNE) 200 MG tablet, Take 1 tablet (200 mg total) by mouth 2 (two) times daily., Disp: 60 tablet, Rfl: 4;  metFORMIN (GLUCOPHAGE-XR) 500 MG 24 hr tablet, Take 500 mg by mouth daily with breakfast. , Disp: , Rfl: ;  PRENATAL VITAMINS PO, Take 1 tablet by mouth daily. , Disp: , Rfl: ;  DISCONTD: labetalol (NORMODYNE) 100 MG tablet, Titrate dose gradually up to 2 tablets twice daily, as directed, Disp: 120 tablet, Rfl: 0  Allergies  Allergen Reactions  . Dilaudid (Hydromorphone Hcl) Hives  . Morphine And Related Hives  . Peanut-Containing Drug Products Hives   ROS: See HPI.  Denies chest pain, palpitations, URI symptoms, fevers, GI concerns or other problems  PHYSICAL EXAM BP 140/100  Pulse 72  Ht 5\' 2"  (1.575  m)  Wt 296 lb (134.265 kg)  BMI 54.14 kg/m2  LMP 03/25/2011 Well developed, pleasant overweight female in no distress 141/97 by pt's machine L wrist 144/100 RA by MD Heart: regular rate and rhythm without murmur Lungs: clear bilaterally Extremities: no edema  ASSESSMENT/PLAN: 1. Essential hypertension, benign  labetalol (NORMODYNE) 200 MG tablet, Home sleep test  2. Headache  Home sleep test  3. Type II or unspecified type diabetes mellitus without mention of complication, not stated as uncontrolled     HTN-- overall improved.  Still borderline high.  Continue same medications, but try and work on daily exercise and continued weight loss to eventually get to goal BP  Sleep study ordered to r/o  sleep apnea as contributing to her HTN and morning headaches  accu-check Aviva test strips and lancets--written rx x 1 year given  Continue metformin--consider changing time--ie. Take in the morning. We have never rx'd this med--she will double check to see if regular glucophage or XR. If isn't the XR, she can also try taking 1/2 BID (not to split it if XR)  F/u 4 months, sooner prn

## 2011-04-16 ENCOUNTER — Inpatient Hospital Stay (HOSPITAL_COMMUNITY)
Admission: AD | Admit: 2011-04-16 | Discharge: 2011-04-16 | Disposition: A | Discharge status: Home / Self Care | Payer: PRIVATE HEALTH INSURANCE | Source: Ambulatory Visit | Attending: Obstetrics & Gynecology | Admitting: Obstetrics & Gynecology

## 2011-04-16 ENCOUNTER — Encounter (HOSPITAL_COMMUNITY): Payer: Self-pay | Admitting: *Deleted

## 2011-04-16 DIAGNOSIS — E119 Type 2 diabetes mellitus without complications: Secondary | ICD-10-CM | POA: Insufficient documentation

## 2011-04-16 DIAGNOSIS — B9689 Other specified bacterial agents as the cause of diseases classified elsewhere: Secondary | ICD-10-CM | POA: Insufficient documentation

## 2011-04-16 DIAGNOSIS — A499 Bacterial infection, unspecified: Secondary | ICD-10-CM | POA: Insufficient documentation

## 2011-04-16 DIAGNOSIS — N760 Acute vaginitis: Secondary | ICD-10-CM | POA: Insufficient documentation

## 2011-04-16 DIAGNOSIS — R109 Unspecified abdominal pain: Secondary | ICD-10-CM | POA: Insufficient documentation

## 2011-04-16 DIAGNOSIS — I10 Essential (primary) hypertension: Secondary | ICD-10-CM | POA: Insufficient documentation

## 2011-04-16 DIAGNOSIS — R1084 Generalized abdominal pain: Secondary | ICD-10-CM

## 2011-04-16 LAB — CBC
HCT: 35.5 % — ABNORMAL LOW (ref 36.0–46.0)
Hemoglobin: 11.7 g/dL — ABNORMAL LOW (ref 12.0–15.0)
MCH: 25.9 pg — ABNORMAL LOW (ref 26.0–34.0)
MCHC: 33 g/dL (ref 30.0–36.0)
RDW: 15 % (ref 11.5–15.5)

## 2011-04-16 LAB — URINALYSIS, ROUTINE W REFLEX MICROSCOPIC
Bilirubin Urine: NEGATIVE
Ketones, ur: NEGATIVE mg/dL
Nitrite: NEGATIVE
Specific Gravity, Urine: >1.03 — ABNORMAL HIGH (ref 1.005–1.030)
Urobilinogen, UA: 0.2 mg/dL (ref 0.0–1.0)
pH: 6 (ref 5.0–8.0)

## 2011-04-16 LAB — HCG, SERUM, QUALITATIVE: Preg, Serum: NEGATIVE

## 2011-04-16 LAB — WET PREP, GENITAL
WBC, Wet Prep HPF POC: NONE SEEN
Yeast Wet Prep HPF POC: NONE SEEN

## 2011-04-16 LAB — GC/CHLAMYDIA PROBE AMP, GENITAL: GC Probe Amp, Genital: NEGATIVE

## 2011-04-16 MED ORDER — TINIDAZOLE 500 MG PO TABS: 2.0000 g | ORAL_TABLET | Freq: Two times a day (BID) | ORAL | Status: AC

## 2011-04-16 MED ORDER — LABETALOL HCL 100 MG PO TABS
200.0000 mg | ORAL_TABLET | Freq: Once | ORAL | Status: AC
  Administered 2011-04-16: 200 mg via ORAL
  Filled 2011-04-16: qty 2

## 2011-04-16 NOTE — Progress Notes (Signed)
Had period twice last month. +HPT yesterday with "early" test. But then had 2 negative readings on "regular" test. Abdominal pain started 1 week ago, but became worse around 0300. Worse when she lays down. Had to prop herself up to try to sleep.

## 2011-04-16 NOTE — ED Provider Notes (Signed)
Chief Complaint:  Abdominal Pain   Victoria [name] is  30 y.o. G2P1011.  Patient's last menstrual period was 03/25/2011.Marland Kitchen  Her pregnancy status is unknown.  She presents complaining of Abdominal Pain . Onset is described as insidious and has been present for  2 days. Reports + UPT at home. Described lower abd "pulling" that starts low in abd and radiated to low back. Denies abd pain, dysuria, vaginal discharge, or vaginal blding.  Pt states she did not take BP med this morning.  Obstetrical/Gynecological History: OB History    Grav Para Term Preterm Abortions TAB SAB Ect Mult Living   2 1 1  1  1   1       Past Medical History: Past Medical History  Diagnosis Date  . Diabetes mellitus 04/2009    type 2  . Asthma   . Obesity   . Dermoid cyst     LEFT OVARY  . Hypertension     Past Surgical History: Past Surgical History  Procedure Date  . Dermoid cyst removal 2008  . Cesarean section 2009    Family History: Family History  Problem Relation Age of Onset  . Hypertension Mother   . Hypertension Father   . Diabetes Father   . Asthma Father   . Diabetes Sister     Social History: History  Substance Use Topics  . Smoking status: Never Smoker   . Smokeless tobacco: Never Used  . Alcohol Use: No    Allergies:  Allergies  Allergen Reactions  . Dilaudid (Hydromorphone Hcl) Hives  . Morphine And Related Hives  . Peanut-Containing Drug Products Hives    Prescriptions prior to admission  Medication Sig Dispense Refill  . acetaminophen (TYLENOL) 500 MG tablet Take 500 mg by mouth every 6 (six) hours as needed. For headache.       . albuterol (PROVENTIL HFA;VENTOLIN HFA) 108 (90 BASE) MCG/ACT inhaler Inhale 2 puffs into the lungs every 6 (six) hours as needed for wheezing or shortness of breath.  18 g  1  . albuterol (PROVENTIL) (2.5 MG/3ML) 0.083% nebulizer solution Take 2.5 mg by nebulization every 6 (six) hours as needed. Shortness of breath      . labetalol  (NORMODYNE) 200 MG tablet Take 1 tablet (200 mg total) by mouth 2 (two) times daily.  60 tablet  4  . metFORMIN (GLUCOPHAGE-XR) 500 MG 24 hr tablet Take 500 mg by mouth daily with breakfast.       . PRENATAL VITAMINS PO Take 1 tablet by mouth daily.         Review of Systems - Negative except what has been reviewed in the HPI  Physical Exam   Blood pressure 129/74, pulse 78, temperature 97.4 F (36.3 C), temperature source Oral, resp. rate 18, height 5\' 4"  (1.626 m), weight 134.265 kg (296 lb), last menstrual period 03/25/2011.  General: General appearance - alert, well appearing, and in no distress, oriented to person, place, and time and overweight Mental status - alert, oriented to person, place, and time, normal mood, behavior, speech, dress, motor activity, and thought processes, affect appropriate to mood Abdomen - no CVA tenderness Obese, soft, difuse mild tenderness Back exam - full range of motion, no tenderness, palpable spasm or pain on motion Extremities - peripheral pulses normal, no pedal edema, no clubbing or cyanosis Focused Gynecological Exam: normal external genitalia, vulva, vagina, cervix, uterus and adnexa  Labs: Recent Results (from the past 24 hour(s))  URINALYSIS, ROUTINE W REFLEX MICROSCOPIC  Collection Time   04/16/11  8:28 AM      Component Value Range   Color, Urine YELLOW  YELLOW    Appearance CLEAR  CLEAR    Specific Gravity, Urine >1.030 (*) 1.005 - 1.030    pH 6.0  5.0 - 8.0    Glucose, UA NEGATIVE  NEGATIVE (mg/dL)   Hgb urine dipstick TRACE (*) NEGATIVE    Bilirubin Urine NEGATIVE  NEGATIVE    Ketones, ur NEGATIVE  NEGATIVE (mg/dL)   Protein, ur NEGATIVE  NEGATIVE (mg/dL)   Urobilinogen, UA 0.2  0.0 - 1.0 (mg/dL)   Nitrite NEGATIVE  NEGATIVE    Leukocytes, UA NEGATIVE  NEGATIVE   URINE MICROSCOPIC-ADD ON   Collection Time   04/16/11  8:28 AM      Component Value Range   Squamous Epithelial / LPF MANY (*) RARE    RBC / HPF 0-2  <3 (RBC/hpf)   POCT PREGNANCY, URINE   Collection Time   04/16/11  8:38 AM      Component Value Range   Preg Test, Ur NEGATIVE    WET PREP, GENITAL   Collection Time   04/16/11  8:58 AM      Component Value Range   Yeast, Wet Prep NONE SEEN  NONE SEEN    Trich, Wet Prep NONE SEEN  NONE SEEN    Clue Cells, Wet Prep MODERATE (*) NONE SEEN    WBC, Wet Prep HPF POC NONE SEEN  NONE SEEN   HCG, SERUM, QUALITATIVE   Collection Time   04/16/11  9:25 AM      Component Value Range   Preg, Serum NEGATIVE  NEGATIVE   CBC   Collection Time   04/16/11  9:25 AM      Component Value Range   WBC 7.2  4.0 - 10.5 (K/uL)   RBC 4.52  3.87 - 5.11 (MIL/uL)   Hemoglobin 11.7 (*) 12.0 - 15.0 (g/dL)   HCT 21.3 (*) 08.6 - 46.0 (%)   MCV 78.5  78.0 - 100.0 (fL)   MCH 25.9 (*) 26.0 - 34.0 (pg)   MCHC 33.0  30.0 - 36.0 (g/dL)   RDW 57.8  46.9 - 62.9 (%)   Platelets 285  150 - 400 (K/uL)   Imaging Studies:  No results found.   Assessment: BV  Patient Active Problem List  Diagnoses  . Type II or unspecified type diabetes mellitus without mention of complication, not stated as uncontrolled  . Unspecified asthma  . Essential hypertension, benign    Plan: Discharge home Rx Tindamax FU with PCP and continue all medications as prescribed   Shelden Raborn E. 04/16/2011,9:53 AM

## 2011-04-24 ENCOUNTER — Emergency Department (INDEPENDENT_AMBULATORY_CARE_PROVIDER_SITE_OTHER)
Admission: EM | Admit: 2011-04-24 | Discharge: 2011-04-24 | Disposition: A | Discharge status: Home / Self Care | Payer: PRIVATE HEALTH INSURANCE | Source: Home / Self Care | Attending: Emergency Medicine | Admitting: Emergency Medicine

## 2011-04-24 ENCOUNTER — Encounter (HOSPITAL_COMMUNITY): Payer: Self-pay | Admitting: Emergency Medicine

## 2011-04-24 DIAGNOSIS — J45909 Unspecified asthma, uncomplicated: Secondary | ICD-10-CM

## 2011-04-24 DIAGNOSIS — J45901 Unspecified asthma with (acute) exacerbation: Secondary | ICD-10-CM

## 2011-04-24 HISTORY — DX: Other specified bacterial agents as the cause of diseases classified elsewhere: B96.89

## 2011-04-24 HISTORY — DX: Other specified bacterial agents as the cause of diseases classified elsewhere: N76.0

## 2011-04-24 MED ORDER — ALBUTEROL SULFATE (5 MG/ML) 0.5% IN NEBU
5.0000 mg | INHALATION_SOLUTION | Freq: Once | RESPIRATORY_TRACT | Status: AC
  Administered 2011-04-24: 5 mg via RESPIRATORY_TRACT

## 2011-04-24 MED ORDER — ALBUTEROL SULFATE HFA 108 (90 BASE) MCG/ACT IN AERS
2.0000 | INHALATION_SPRAY | RESPIRATORY_TRACT | Status: DC | PRN
  Administered 2011-04-24 (×2): 2 via RESPIRATORY_TRACT

## 2011-04-24 MED ORDER — DEXAMETHASONE 4 MG PO TABS: ORAL_TABLET | ORAL | Status: AC

## 2011-04-24 MED ORDER — PREDNISONE 20 MG PO TABS
ORAL_TABLET | ORAL | Status: AC
  Filled 2011-04-24: qty 3

## 2011-04-24 MED ORDER — ALBUTEROL SULFATE HFA 108 (90 BASE) MCG/ACT IN AERS
INHALATION_SPRAY | RESPIRATORY_TRACT | Status: AC
  Filled 2011-04-24: qty 6.7

## 2011-04-24 MED ORDER — PREDNISONE 20 MG PO TABS
60.0000 mg | ORAL_TABLET | Freq: Once | ORAL | Status: AC
  Administered 2011-04-24: 60 mg via ORAL

## 2011-04-24 MED ORDER — ALBUTEROL SULFATE (5 MG/ML) 0.5% IN NEBU
INHALATION_SOLUTION | RESPIRATORY_TRACT | Status: AC
  Filled 2011-04-24: qty 1

## 2011-04-24 NOTE — ED Provider Notes (Signed)
History     CSN: [id number] Arrival date & time: 04/24/2011  7:09 PM   First MD Initiated Contact with Patient 04/24/11 1919      Chief Complaint  Patient presents with  . Shortness of Breath    HPI Comments: Pt with h/o asthma with of coughing, wheezing, SOB, chest tightness starting today. Achy CP after coughing. States that she broke her nebulizer machine at home. No rescue inhaler.admitted to hospital for asthma, last admission 2005. * No intubations. No recent steriod use. Asthma triggered by unknown.  Patient is a 30 y.o. female presenting with shortness of breath.  Shortness of Breath  The current episode started today. The onset was gradual. The problem has been gradually worsening. The symptoms are relieved by beta-agonist inhalers. The symptoms are aggravated by nothing. Associated symptoms include cough, shortness of breath and wheezing. Pertinent negatives include no chest pain, no chest pressure, no orthopnea, no fever, no rhinorrhea, no sore throat and no stridor. She has had no prior steroid use. She has had prior hospitalizations. She has had prior ICU admissions (2005). She has had no prior intubations. Her past medical history is significant for asthma.    Past Medical History  Diagnosis Date  . Diabetes mellitus 04/2009    type 2  . Asthma   . Obesity   . Dermoid cyst     LEFT OVARY  . Hypertension   . BV (bacterial vaginosis)     Past Surgical History  Procedure Date  . Dermoid cyst removal 2008  . Cesarean section 2009    Family History  Problem Relation Age of Onset  . Hypertension Mother   . Hypertension Father   . Diabetes Father   . Asthma Father   . Diabetes Sister     History  Substance Use Topics  . Smoking status: Never Smoker   . Smokeless tobacco: Never Used  . Alcohol Use: No    OB History    Grav Para Term Preterm Abortions TAB SAB Ect Mult Living   2 1 1  1  1   1       Review of Systems  Constitutional: Negative for fever.   HENT: Negative for sore throat and rhinorrhea.   Respiratory: Positive for cough, shortness of breath and wheezing. Negative for stridor.   Cardiovascular: Negative for chest pain and orthopnea.    Allergies  Dilaudid; Morphine and related; and Peanut-containing drug products  Home Medications   Current Outpatient Rx  Name Route Sig Dispense Refill  . ACETAMINOPHEN 500 MG PO TABS Oral Take 500 mg by mouth every 6 (six) hours as needed. For headache.     . ALBUTEROL SULFATE HFA 108 (90 BASE) MCG/ACT IN AERS Inhalation Inhale 2 puffs into the lungs every 6 (six) hours as needed for wheezing or shortness of breath. 18 g 1  . ALBUTEROL SULFATE (2.5 MG/3ML) 0.083% IN NEBU Nebulization Take 2.5 mg by nebulization every 6 (six) hours as needed. Shortness of breath    . DEXAMETHASONE 4 MG PO TABS  4 tabs po at once on day one, 4 tabs po at once on day 2 8 tablet 0  . LABETALOL HCL 200 MG PO TABS Oral Take 1 tablet (200 mg total) by mouth 2 (two) times daily. 60 tablet 4  . METFORMIN HCL ER 500 MG PO TB24 Oral Take 500 mg by mouth daily with breakfast.     . PRENATAL VITAMINS PO Oral Take 1 tablet by mouth daily.     Marland Kitchen  TINIDAZOLE 500 MG PO TABS Oral Take 4 tablets (2,000 mg total) by mouth 2 (two) times daily. 8 tablet 0    BP 151/111  Pulse 82  Temp(Src) 97.3 F (36.3 C) (Oral)  Resp 44  SpO2 98%  LMP 04/24/2011  Physical Exam  Nursing note and vitals reviewed. Constitutional: She is oriented to person, place, and time. She appears well-developed and well-nourished. No distress.  HENT:  Head: Normocephalic and atraumatic.  Eyes: EOM are normal. Pupils are equal, round, and reactive to light.  Neck: Normal range of motion.  Cardiovascular: Regular rhythm.   Pulmonary/Chest: Effort normal. She has wheezes. She has rales. She exhibits no tenderness.       Prolonged exp phase, poor air movement  Abdominal: Soft. She exhibits no distension.  Musculoskeletal: Normal range of motion.    Neurological: She is alert and oriented to person, place, and time.  Skin: Skin is warm and dry.  Psychiatric: She has a normal mood and affect. Her behavior is normal. Judgment and thought content normal.    ED Course  Procedures (including critical care time)  Labs Reviewed - No data to display No results found.   1. Asthma attack       MDM  Pt seen and examined. Pt with an asthma exacerbation secondary to allergies/URI/change in weather.  speaking in full sentences but has poor air movement,  wheezing throughout all lung fields, Pt given albuterol/atrovent, prednisone for asthma exacerbation. Peak flow done. intial 340/365/420.  No focal lung findings, no fever, will not do CXR.  Will re-evaluate.  On re-evaluation, pt feels much improved after medication, pt comfortable, VSS. Improved air movement, no wheezing on repeat physical exam. repeat peak flow 500/550/500. . Emphasized importance of f/u. Pt agrees.    Luiz Blare, MD 04/24/11 2053

## 2011-04-24 NOTE — ED Notes (Signed)
Has not taken breathing medication since this morning, due to losing the breathing treatment chamber to the breathing machine.

## 2011-05-29 ENCOUNTER — Encounter (HOSPITAL_COMMUNITY): Payer: Self-pay | Admitting: *Deleted

## 2011-05-29 ENCOUNTER — Emergency Department (HOSPITAL_COMMUNITY)
Admission: EM | Admit: 2011-05-29 | Discharge: 2011-05-29 | Disposition: A | Discharge status: Home / Self Care | Payer: PRIVATE HEALTH INSURANCE | Attending: Emergency Medicine | Admitting: Emergency Medicine

## 2011-05-29 DIAGNOSIS — J45909 Unspecified asthma, uncomplicated: Secondary | ICD-10-CM | POA: Insufficient documentation

## 2011-05-29 DIAGNOSIS — K089 Disorder of teeth and supporting structures, unspecified: Secondary | ICD-10-CM | POA: Insufficient documentation

## 2011-05-29 DIAGNOSIS — E119 Type 2 diabetes mellitus without complications: Secondary | ICD-10-CM | POA: Insufficient documentation

## 2011-05-29 DIAGNOSIS — R51 Headache: Secondary | ICD-10-CM | POA: Insufficient documentation

## 2011-05-29 DIAGNOSIS — K029 Dental caries, unspecified: Secondary | ICD-10-CM

## 2011-05-29 DIAGNOSIS — I10 Essential (primary) hypertension: Secondary | ICD-10-CM | POA: Insufficient documentation

## 2011-05-29 DIAGNOSIS — Z79899 Other long term (current) drug therapy: Secondary | ICD-10-CM | POA: Insufficient documentation

## 2011-05-29 MED ORDER — HYDROCODONE-ACETAMINOPHEN 5-325 MG PO TABS: 2.0000 | ORAL_TABLET | Freq: Once | ORAL | Status: DC

## 2011-05-29 MED ORDER — TRAMADOL HCL 50 MG PO TABS: 50.0000 mg | ORAL_TABLET | Freq: Four times a day (QID) | ORAL | Status: AC | PRN

## 2011-05-29 MED ORDER — HYDROCODONE-ACETAMINOPHEN 5-325 MG PO TABS: 1.0000 | ORAL_TABLET | ORAL | Status: DC | PRN

## 2011-05-29 NOTE — ED Notes (Signed)
To ed for eval of left upper tooth pain for the past cple weeks. Pt states she has started taking PCN she 'had left over' for tooth pain. Now with HA.

## 2011-05-29 NOTE — ED Provider Notes (Signed)
History     CSN: [id number]  Arrival date & time 05/29/11  1127   First MD Initiated Contact with Patient 05/29/11 1216      Chief Complaint  Patient presents with  . Headache  . Dental Pain    (Consider location/radiation/quality/duration/timing/severity/associated sxs/prior treatment) Patient is a 31 y.o. female presenting with headaches and tooth pain.  Headache   Dental PainThe primary symptoms include headaches.  .... complains of headache and tooth pain for 2 days.Marland Kitchen  tooth pain is in the left upper area of her mouth. Has been taking ibuprofen with minimal relief. No stiff neck. Pain is moderate. Has been taking penicillin since yesterday.  Past Medical History  Diagnosis Date  . Diabetes mellitus 04/2009    type 2  . Asthma   . Obesity   . Dermoid cyst     LEFT OVARY  . Hypertension   . BV (bacterial vaginosis)     Past Surgical History  Procedure Date  . Dermoid cyst removal 2008  . Cesarean section 2009    Family History  Problem Relation Age of Onset  . Hypertension Mother   . Hypertension Father   . Diabetes Father   . Asthma Father   . Diabetes Sister     History  Substance Use Topics  . Smoking status: Never Smoker   . Smokeless tobacco: Never Used  . Alcohol Use: No    OB History    Grav Para Term Preterm Abortions TAB SAB Ect Mult Living   2 1 1  1  1   1       Review of Systems  Neurological: Positive for headaches.  All other systems reviewed and are negative.    Allergies  Dilaudid; Morphine and related; and Peanut-containing drug products  Home Medications   Current Outpatient Rx  Name Route Sig Dispense Refill  . ACETAMINOPHEN 325 MG PO TABS Oral Take 650 mg by mouth every 6 (six) hours as needed. For pain.     . ALBUTEROL SULFATE HFA 108 (90 BASE) MCG/ACT IN AERS Inhalation Inhale 2 puffs into the lungs every 6 (six) hours as needed. Wheezing or shortness of breath.     . ALBUTEROL SULFATE (2.5 MG/3ML) 0.083% IN NEBU  Nebulization Take 2.5 mg by nebulization every 6 (six) hours as needed. Shortness of breath    . IBUPROFEN 200 MG PO TABS Oral Take 200 mg by mouth every 6 (six) hours as needed. For pain.     Marland Kitchen LABETALOL HCL 200 MG PO TABS Oral Take 1 tablet (200 mg total) by mouth 2 (two) times daily. 60 tablet 4  . METFORMIN HCL ER 500 MG PO TB24 Oral Take 500 mg by mouth daily with breakfast.     . TRAMADOL HCL 50 MG PO TABS Oral Take 1 tablet (50 mg total) by mouth every 6 (six) hours as needed for pain. Maximum dose= 8 tablets per day 20 tablet 0    BP 149/95  Pulse 78  Temp(Src) 98.1 F (36.7 C) (Oral)  SpO2 96%  Physical Exam  Nursing note and vitals reviewed. Constitutional: She is oriented to person, place, and time. She appears well-developed and well-nourished.  HENT:  Head: Normocephalic and atraumatic.       Obvious caries left upper mouth  Eyes: Conjunctivae and EOM are normal. Pupils are equal, round, and reactive to light.  Neck: Normal range of motion. Neck supple.  Cardiovascular: Normal rate and regular rhythm.   Pulmonary/Chest: Effort normal and  breath sounds normal.  Abdominal: Soft. Bowel sounds are normal.  Musculoskeletal: Normal range of motion.  Neurological: She is alert and oriented to person, place, and time.  Skin: Skin is warm and dry.  Psychiatric: She has a normal mood and affect.    ED Course  Procedures (including critical care time)  Labs Reviewed - No data to display No results found.   1. Tooth caries   2. Headache       MDM  Patient is nontoxic. No stiff neck. No neuro deficits. We'll continue penicillin and prescribed pain meds        Donnetta Hutching, MD 05/29/11 1425

## 2011-06-05 ENCOUNTER — Other Ambulatory Visit: Payer: Self-pay | Admitting: *Deleted

## 2011-06-05 DIAGNOSIS — G4730 Sleep apnea, unspecified: Secondary | ICD-10-CM

## 2011-06-13 ENCOUNTER — Telehealth: Payer: Self-pay | Admitting: Internal Medicine

## 2011-06-13 DIAGNOSIS — E119 Type 2 diabetes mellitus without complications: Secondary | ICD-10-CM

## 2011-06-13 MED ORDER — METFORMIN HCL ER 500 MG PO TB24: 500.0000 mg | ORAL_TABLET | Freq: Every day | ORAL | Status: DC

## 2011-06-13 NOTE — Telephone Encounter (Signed)
Also called in patients lancets and test strips. Accu-check multiclix #100 and accu-check aviva test strip #100 to [facility] [address]

## 2011-06-15 ENCOUNTER — Encounter (HOSPITAL_COMMUNITY): Payer: Self-pay | Admitting: Emergency Medicine

## 2011-06-15 ENCOUNTER — Emergency Department (HOSPITAL_COMMUNITY): Payer: No Typology Code available for payment source

## 2011-06-15 ENCOUNTER — Emergency Department (HOSPITAL_COMMUNITY)
Admission: EM | Admit: 2011-06-15 | Discharge: 2011-06-15 | Disposition: A | Discharge status: Home / Self Care | Payer: No Typology Code available for payment source | Attending: Emergency Medicine | Admitting: Emergency Medicine

## 2011-06-15 DIAGNOSIS — E119 Type 2 diabetes mellitus without complications: Secondary | ICD-10-CM | POA: Insufficient documentation

## 2011-06-15 DIAGNOSIS — M542 Cervicalgia: Secondary | ICD-10-CM | POA: Insufficient documentation

## 2011-06-15 DIAGNOSIS — Z79899 Other long term (current) drug therapy: Secondary | ICD-10-CM | POA: Insufficient documentation

## 2011-06-15 DIAGNOSIS — S20219A Contusion of unspecified front wall of thorax, initial encounter: Secondary | ICD-10-CM

## 2011-06-15 DIAGNOSIS — R079 Chest pain, unspecified: Secondary | ICD-10-CM | POA: Insufficient documentation

## 2011-06-15 DIAGNOSIS — I10 Essential (primary) hypertension: Secondary | ICD-10-CM | POA: Insufficient documentation

## 2011-06-15 DIAGNOSIS — J45909 Unspecified asthma, uncomplicated: Secondary | ICD-10-CM | POA: Insufficient documentation

## 2011-06-15 LAB — URINALYSIS, ROUTINE W REFLEX MICROSCOPIC
Ketones, ur: NEGATIVE mg/dL
Leukocytes, UA: NEGATIVE
Nitrite: NEGATIVE
pH: 6.5 (ref 5.0–8.0)

## 2011-06-15 LAB — POCT I-STAT, CHEM 8
Calcium, Ion: 1.2 mmol/L (ref 1.12–1.32)
Chloride: 106 mEq/L (ref 96–112)
HCT: 39 % (ref 36.0–46.0)
TCO2: 24 mmol/L (ref 0–100)

## 2011-06-15 LAB — PREGNANCY, URINE: Preg Test, Ur: NEGATIVE

## 2011-06-15 LAB — GLUCOSE, CAPILLARY: Glucose-Capillary: 151 mg/dL — ABNORMAL HIGH (ref 70–99)

## 2011-06-15 LAB — URINE MICROSCOPIC-ADD ON

## 2011-06-15 MED ORDER — SODIUM CHLORIDE 0.9 % IV BOLUS (SEPSIS)
1000.0000 mL | Freq: Once | INTRAVENOUS | Status: AC
  Administered 2011-06-15: 1000 mL via INTRAVENOUS

## 2011-06-15 MED ORDER — HYDROCODONE-ACETAMINOPHEN 5-325 MG PO TABS: 2.0000 | ORAL_TABLET | ORAL | Status: AC | PRN

## 2011-06-15 MED ORDER — IBUPROFEN 800 MG PO TABS: 800.0000 mg | ORAL_TABLET | Freq: Three times a day (TID) | ORAL | Status: AC

## 2011-06-15 MED ORDER — ONDANSETRON HCL 4 MG/2ML IJ SOLN
4.0000 mg | Freq: Once | INTRAMUSCULAR | Status: AC
  Administered 2011-06-15: 4 mg via INTRAVENOUS
  Filled 2011-06-15: qty 2

## 2011-06-15 MED ORDER — IOHEXOL 300 MG/ML  SOLN
100.0000 mL | Freq: Once | INTRAMUSCULAR | Status: AC | PRN
  Administered 2011-06-15: 100 mL via INTRAVENOUS

## 2011-06-15 MED ORDER — FENTANYL CITRATE 0.05 MG/ML IJ SOLN
50.0000 ug | Freq: Once | INTRAMUSCULAR | Status: AC
  Administered 2011-06-15: 50 ug via INTRAVENOUS
  Filled 2011-06-15: qty 2

## 2011-06-15 NOTE — ED Notes (Signed)
CBG 151. 
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
## 2011-06-15 NOTE — ED Notes (Signed)
Pt denies neck or back pain at this time.
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
## 2011-06-18 ENCOUNTER — Ambulatory Visit (HOSPITAL_BASED_OUTPATIENT_CLINIC_OR_DEPARTMENT_OTHER): Payer: No Typology Code available for payment source

## 2011-08-08 ENCOUNTER — Encounter (HOSPITAL_COMMUNITY): Payer: Self-pay | Admitting: *Deleted

## 2011-08-08 ENCOUNTER — Emergency Department (HOSPITAL_COMMUNITY)
Admission: EM | Admit: 2011-08-08 | Discharge: 2011-08-09 | Disposition: A | Discharge status: Home / Self Care | Payer: PRIVATE HEALTH INSURANCE | Attending: Emergency Medicine | Admitting: Emergency Medicine

## 2011-08-08 ENCOUNTER — Emergency Department (HOSPITAL_COMMUNITY): Payer: PRIVATE HEALTH INSURANCE

## 2011-08-08 DIAGNOSIS — J45909 Unspecified asthma, uncomplicated: Secondary | ICD-10-CM | POA: Insufficient documentation

## 2011-08-08 DIAGNOSIS — E119 Type 2 diabetes mellitus without complications: Secondary | ICD-10-CM | POA: Insufficient documentation

## 2011-08-08 DIAGNOSIS — M25473 Effusion, unspecified ankle: Secondary | ICD-10-CM | POA: Insufficient documentation

## 2011-08-08 DIAGNOSIS — W108XXA Fall (on) (from) other stairs and steps, initial encounter: Secondary | ICD-10-CM | POA: Insufficient documentation

## 2011-08-08 DIAGNOSIS — S93409A Sprain of unspecified ligament of unspecified ankle, initial encounter: Secondary | ICD-10-CM

## 2011-08-08 DIAGNOSIS — M25476 Effusion, unspecified foot: Secondary | ICD-10-CM | POA: Insufficient documentation

## 2011-08-08 DIAGNOSIS — M25579 Pain in unspecified ankle and joints of unspecified foot: Secondary | ICD-10-CM | POA: Insufficient documentation

## 2011-08-08 DIAGNOSIS — I10 Essential (primary) hypertension: Secondary | ICD-10-CM | POA: Insufficient documentation

## 2011-08-08 NOTE — ED Notes (Signed)
The pt fell down some steps just pta when she tripped.  C/o severe pain in her lt ankle

## 2011-08-09 MED ORDER — IBUPROFEN 800 MG PO TABS
800.0000 mg | ORAL_TABLET | Freq: Once | ORAL | Status: AC
  Administered 2011-08-09: 800 mg via ORAL
  Filled 2011-08-09: qty 1

## 2011-08-09 MED ORDER — HYDROCODONE-ACETAMINOPHEN 5-325 MG PO TABS
1.0000 | ORAL_TABLET | Freq: Once | ORAL | Status: DC
  Filled 2011-08-09: qty 1

## 2011-08-09 NOTE — Discharge Instructions (Signed)
Ankle Sprain An ankle sprain is an injury to the strong, fibrous tissues (ligaments) that hold the bones of your ankle joint together.  CAUSES Ankle sprain usually is caused by a fall or by twisting your ankle. People who participate in sports are more prone to these types of injuries.  SYMPTOMS  Symptoms of ankle sprain include:  Pain in your ankle. The pain may be present at rest or only when you are trying to stand or walk.   Swelling.   Bruising. Bruising may develop immediately or within 1 to 2 days after your injury.   Difficulty standing or walking.  DIAGNOSIS  Your caregiver will ask you details about your injury and perform a physical exam of your ankle to determine if you have an ankle sprain. During the physical exam, your caregiver will press and squeeze specific areas of your foot and ankle. Your caregiver will try to move your ankle in certain ways. An X-ray exam may be done to be sure a bone was not broken or a ligament did not separate from one of the bones in your ankle (avulsion).  TREATMENT  Certain types of braces can help stabilize your ankle. Your caregiver can make a recommendation for this. Your caregiver may recommend the use of medication for pain. If your sprain is severe, your caregiver may refer you to a surgeon who helps to restore function to parts of your skeletal system (orthopedist) or a physical therapist. HOME CARE INSTRUCTIONS  Apply ice to your injury for 1 to 2 days or as directed by your caregiver. Applying ice helps to reduce inflammation and pain.  Put ice in a plastic bag.   Place a towel between your skin and the bag.   Leave the ice on for 15 to 20 minutes at a time, every 2 hours while you are awake.   Take over-the-counter or prescription medicines for pain, discomfort, or fever only as directed by your caregiver.   Keep your injured leg elevated, when possible, to lessen swelling.   If your caregiver recommends crutches, use them as  instructed. Gradually, put weight on the affected ankle. Continue to use crutches or a cane until you can walk without feeling pain in your ankle.   If you have a plaster splint, wear the splint as directed by your caregiver. Do not rest it on anything harder than a pillow the first 24 hours. Do not put weight on it. Do not get it wet. You may take it off to take a shower or bath.   You may have been given an elastic bandage to wear around your ankle to provide support. If the elastic bandage is too tight (you have numbness or tingling in your foot or your foot becomes cold and blue), adjust the bandage to make it comfortable.   If you have an air splint, you may blow more air into it or let air out to make it more comfortable. You may take your splint off at night and before taking a shower or bath.   Wiggle your toes in the splint several times per day if you are able.  SEEK MEDICAL CARE IF:   You have an increase in bruising, swelling, or pain.   Your toes feel cold.   Pain relief is not achieved with medication.  SEEK IMMEDIATE MEDICAL CARE IF: Your toes are numb or blue or you have severe pain. MAKE SURE YOU:   Understand these instructions.   Will watch your condition.     Will get help right away if you are not doing well or get worse.  Document Released: 05/13/2005 Document Revised: 05/02/2011 Document Reviewed: 12/16/2007 [facility] Patient Information 2012 [place], [place].Acute Ankle Sprain with Phase I Rehab An acute ankle sprain is a partial or complete tear in one or more of the ligaments of the ankle due to traumatic injury. The severity of the injury depends on both the the number of ligaments sprained and the grade of sprain. There are 3 grades of sprains.   A grade 1 sprain is a mild sprain. There is a slight pull without obvious tearing. There is no loss of strength, and the muscle and ligament are the correct length.   A grade 2 sprain is a moderate sprain. There is  tearing of fibers within the substance of the ligament where it connects two bones or two cartilages. The length of the ligament is increased, and there is usually decreased strength.   A grade 3 sprain is a complete rupture of the ligament and is uncommon.  In addition to the grade of sprain, there are three types of ankle sprains.  Lateral ankle sprains: This is a sprain of one or more of the three ligaments on the outer side (lateral) of the ankle. These are the most common sprains. Medial ankle sprains: There is one large triangular ligament of the inner side (medial) of the ankle that is susceptible to injury. Medial ankle sprains are less common. Syndesmosis, "high ankle," sprains: The syndesmosis is the ligament that connects the two bones of the lower leg. Syndesmosis sprains usually only occur with very severe ankle sprains. SYMPTOMS  Pain, tenderness, and swelling in the ankle, starting at the side of injury that may progress to the whole ankle and foot with time.   "Pop" or tearing sensation at the time of injury.   Bruising that may spread to the heel.   Impaired ability to walk soon after injury.  CAUSES   Acute ankle sprains are caused by trauma placed on the ankle that temporarily forces or pries the anklebone (talus) out of its normal socket.   Stretching or tearing of the ligaments that normally hold the joint in place (usually due to a twisting injury).  RISK INCREASES WITH:  Previous ankle sprain.   Sports in which the foot may land awkwardly (ie. basketball, volleyball, or soccer) or walking or running on uneven or rough surfaces.   Shoes with inadequate support to prevent sideways motion when stress occurs.   Poor strength and flexibility.   Poor balance skills.   Contact sports.  PREVENTION   Warm up and stretch properly before activity.   Maintain physical fitness:   Ankle and leg flexibility, muscle strength, and endurance.   Cardiovascular fitness.     Balance training activities.   Use proper technique and have a coach correct improper technique.   Taping, protective strapping, bracing, or high-top tennis shoes may help prevent injury. Initially, tape is best; however, it loses most of its support function within 10 to 15 minutes.   Wear proper fitted protective shoes (High-top shoes with taping or bracing is more effective than either alone).   Provide the ankle with support during sports and practice activities for 12 months following injury.  PROGNOSIS   If treated properly, ankle sprains can be expected to recover completely; however, the length of recovery depends on the degree of injury.   A grade 1 sprain usually heals enough in 5 to 7 days to allow  modified activity and requires an average of 6 weeks to heal completely.   A grade 2 sprain requires 6 to 10 weeks to heal completely.   A grade 3 sprain requires 12 to 16 weeks to heal.   A syndesmosis sprain often takes more than 3 months to heal.  RELATED COMPLICATIONS   Frequent recurrence of symptoms may result in a chronic problem. Appropriately addressing the problem the first time decreases the frequency of recurrence and optimizes healing time. Severity of the initial sprain does not predict the likelihood of later instability.   Injury to other structures (bone, cartilage, or tendon).   A chronically unstable or arthritic ankle joint is a possiblity with repeated sprains.  TREATMENT Treatment initially involves the use of ice, medication, and compression bandages to help reduce pain and inflammation. Ankle sprains are usually immobilized in a walking cast or boot to allow for healing. Crutches may be recommended to reduce pressure on the injury. After immobilization, strengthening and stretching exercises may be necessary to regain strength and a full range of motion. Surgery is rarely needed to treat ankle sprains. MEDICATION   Nonsteroidal anti-inflammatory  medications, such as aspirin and ibuprofen (do not take for the first 3 days after injury or within 7 days before surgery), or other minor pain relievers, such as acetaminophen, are often recommended. Take these as directed by your caregiver. Contact your caregiver immediately if any bleeding, stomach upset, or signs of an allergic reaction occur from these medications.   Ointments applied to the skin may be helpful.   Pain relievers may be prescribed as necessary by your caregiver. Do not take prescription pain medication for longer than 4 to 7 days. Use only as directed and only as much as you need.  HEAT AND COLD  Cold treatment (icing) is used to relieve pain and reduce inflammation for acute and chronic cases. Cold should be applied for 10 to 15 minutes every 2 to 3 hours for inflammation and pain and immediately after any activity that aggravates your symptoms. Use ice packs or an ice massage.   Heat treatment may be used before performing stretching and strengthening activities prescribed by your caregiver. Use a heat pack or a warm soak.  SEEK IMMEDIATE MEDICAL CARE IF:   Pain, swelling, or bruising worsens despite treatment.   You experience pain, numbness, discoloration, or coldness in the foot or toes.   New, unexplained symptoms develop (drugs used in treatment may produce side effects.)  EXERCISES  PHASE I EXERCISES RANGE OF MOTION (ROM) AND STRETCHING EXERCISES - Ankle Sprain, Acute Phase I, Weeks 1 to 2 These exercises may help you when beginning to restore flexibility in your ankle. You will likely work on these exercises for the 1 to 2 weeks after your injury. Once your physician, physical therapist, or athletic trainer sees adequate progress, he or she will advance your exercises. While completing these exercises, remember:   Restoring tissue flexibility helps normal motion to return to the joints. This allows healthier, less painful movement and activity.   An effective  stretch should be held for at least 30 seconds.   A stretch should never be painful. You should only feel a gentle lengthening or release in the stretched tissue.  RANGE OF MOTION - Dorsi/Plantar Flexion  While sitting with your right / left knee straight, draw the top of your foot upwards by flexing your ankle. Then reverse the motion, pointing your toes downward.   Hold each position for __5________ seconds.  After completing your first set of exercises, repeat this exercise with your knee bent.  Repeat _____3_____ times. Complete this exercise _____2_____ times per day.  RANGE OF MOTION - Ankle Alphabet  Imagine your right / left big toe is a pen.   Keeping your hip and knee still, write out the entire alphabet with your "pen." Make the letters as large as you can without increasing any discomfort.  Repeat __________ times. Complete this exercise __________ times per day.  STRENGTHENING EXERCISES - Ankle Sprain, Acute -Phase I, Weeks 1 to 2 These exercises may help you when beginning to restore strength in your ankle. You will likely work on these exercises for 1 to 2 weeks after your injury. Once your physician, physical therapist, or athletic trainer sees adequate progress, he or she will advance your exercises. While completing these exercises, remember:   Muscles can gain both the endurance and the strength needed for everyday activities through controlled exercises.   Complete these exercises as instructed by your physician, physical therapist, or athletic trainer. Progress the resistance and repetitions only as guided.   You may experience muscle soreness or fatigue, but the pain or discomfort you are trying to eliminate should never worsen during these exercises. If this pain does worsen, stop and make certain you are following the directions exactly. If the pain is still present after adjustments, discontinue the exercise until you can discuss the trouble with your clinician.    STRENGTH - Dorsiflexors  Secure a rubber exercise band/tubing to a fixed object (ie. table, pole) and loop the other end around your right / left foot.   Sit on the floor facing the fixed object. The band/tubing should be slightly tense when your foot is relaxed.   Slowly draw your foot back toward you using your ankle and toes.   Hold this position for __________ seconds. Slowly release the tension in the band and return your foot to the starting position.  Repeat __________ times. Complete this exercise __________ times per day.  STRENGTH - Plantar-flexors   Sit with your right / left leg extended. Holding onto both ends of a rubber exercise band/tubing, loop it around the ball of your foot. Keep a slight tension in the band.   Slowly push your toes away from you, pointing them downward.   Hold this position for __________ seconds. Return slowly, controlling the tension in the band/tubing.  Repeat __________ times. Complete this exercise __________ times per day.  STRENGTH - Ankle Eversion  Secure one end of a rubber exercise band/tubing to a fixed object (table, pole). Loop the other end around your foot just before your toes.   Place your fists between your knees. This will focus your strengthening at your ankle.   Drawing the band/tubing across your opposite foot, slowly, pull your little toe out and up. Make sure the band/tubing is positioned to resist the entire motion.   Hold this position for __________ seconds.  Have your muscles resist the band/tubing as it slowly pulls your foot back to the starting position.  Repeat __________ times. Complete this exercise __________ times per day.  STRENGTH - Ankle Inversion  Secure one end of a rubber exercise band/tubing to a fixed object (table, pole). Loop the other end around your foot just before your toes.   Place your fists between your knees. This will focus your strengthening at your ankle.   Slowly, pull your big toe up  and in, making sure the band/tubing is positioned to  resist the entire motion.   Hold this position for __________ seconds.   Have your muscles resist the band/tubing as it slowly pulls your foot back to the starting position.  Repeat __________ times. Complete this exercises __________ times per day.  STRENGTH - Towel Curls  Sit in a chair positioned on a non-carpeted surface.   Place your right / left foot on a towel, keeping your heel on the floor.   Pull the towel toward your heel by only curling your toes. Keep your heel on the floor.   If instructed by your physician, physical therapist, or athletic trainer, add weight to the end of the towel.  Repeat __________ times. Complete this exercise __________ times per day. Document Released: 12/12/2004 Document Revised: 05/02/2011 Document Reviewed: 08/25/2008 [facility] Patient Information 2012 [place], [place].

## 2011-08-09 NOTE — ED Provider Notes (Signed)
Medical screening examination/treatment/procedure(s) were performed by non-physician practitioner and as supervising physician I was immediately available for consultation/collaboration.   Antwon Rochin A Braxson Hollingsworth, MD 08/09/11 0750 

## 2011-08-09 NOTE — ED Provider Notes (Signed)
History     CSN: [id number]  Arrival date & time 08/08/11  2307   First MD Initiated Contact with Patient 08/09/11 0201      Chief Complaint  Patient presents with  . Ankle Pain    (Consider location/radiation/quality/duration/timing/severity/associated sxs/prior treatment) HPI Comments: Slipped down 2 steps, twisting her left ankle  Patient is a 31 y.o. female presenting with ankle pain. The history is provided by the patient.  Ankle Pain  The incident occurred 3 to 5 hours ago. The incident occurred at home. The injury mechanism was a fall. The pain is present in the left ankle. The pain is at a severity of 4/10. The pain is moderate. The pain has been constant since onset. Pertinent negatives include no numbness, no loss of motion and no tingling.    Past Medical History  Diagnosis Date  . Diabetes mellitus 04/2009    type 2  . Asthma   . Obesity   . Dermoid cyst     LEFT OVARY  . Hypertension   . BV (bacterial vaginosis)     Past Surgical History  Procedure Date  . Dermoid cyst removal 2008  . Cesarean section 2009    Family History  Problem Relation Age of Onset  . Hypertension Mother   . Hypertension Father   . Diabetes Father   . Asthma Father   . Diabetes Sister     History  Substance Use Topics  . Smoking status: Never Smoker   . Smokeless tobacco: Never Used  . Alcohol Use: No    OB History    Grav Para Term Preterm Abortions TAB SAB Ect Mult Living   2 1 1  1  1   1       Review of Systems  Constitutional: Negative for fever.  Cardiovascular: Negative for leg swelling.  Musculoskeletal: Positive for joint swelling.  Neurological: Negative for tingling, weakness and numbness.    Allergies  Dilaudid; Morphine and related; and Peanut-containing drug products  Home Medications   Current Outpatient Rx  Name Route Sig Dispense Refill  . ACETAMINOPHEN 325 MG PO TABS Oral Take 650 mg by mouth every 6 (six) hours as needed. For pain.     .  ALBUTEROL SULFATE HFA 108 (90 BASE) MCG/ACT IN AERS Inhalation Inhale 2 puffs into the lungs every 6 (six) hours as needed. Wheezing or shortness of breath.     . ALBUTEROL SULFATE (2.5 MG/3ML) 0.083% IN NEBU Nebulization Take 2.5 mg by nebulization every 6 (six) hours as needed. Shortness of breath    . IBUPROFEN 200 MG PO TABS Oral Take 200 mg by mouth every 6 (six) hours as needed. For pain.     Marland Kitchen LABETALOL HCL 200 MG PO TABS Oral Take 1 tablet (200 mg total) by mouth 2 (two) times daily. 60 tablet 4  . METFORMIN HCL ER 500 MG PO TB24 Oral Take 1 tablet (500 mg total) by mouth daily with breakfast. 30 tablet 1    BP 156/105  Pulse 95  Temp(Src) 98.1 F (36.7 C) (Oral)  Resp 22  SpO2 98%  LMP 08/08/2011  Physical Exam  Constitutional: She appears well-developed and well-nourished.  HENT:  Head: Normocephalic.  Eyes: Pupils are equal, round, and reactive to light.  Neck: Normal range of motion.  Cardiovascular: Normal rate.   Pulmonary/Chest: Effort normal.  Musculoskeletal:       Swelling of the medial malleolus.  Tender, decreased range of motion, neurovascularly intact.  Movement of the  toes.  Refill less than 3  Neurological: She is alert.  Skin: Skin is warm.    ED Course  Procedures (including critical care time)  Labs Reviewed - No data to display Dg Ankle Complete Left  08/08/2011  *RADIOLOGY REPORT*  Clinical Data: Fall  LEFT ANKLE COMPLETE - 3+ VIEW  Comparison: None.  Findings: Diffuse soft tissue swelling about the ankle is noted. Minimal spurring at the inferior calcaneus.  No acute fracture and no dislocation.  IMPRESSION: No acute bony pathology.  Original Report Authenticated By: Donavan Burnet, M.D.     No diagnosis found.    MDM  Ankle sprain        Arman Filter, NP 08/09/11 1610  Arman Filter, NP 08/09/11 [id number]

## 2011-08-14 ENCOUNTER — Ambulatory Visit (INDEPENDENT_AMBULATORY_CARE_PROVIDER_SITE_OTHER): Payer: PRIVATE HEALTH INSURANCE | Admitting: Family Medicine

## 2011-08-14 ENCOUNTER — Encounter: Payer: Self-pay | Admitting: Family Medicine

## 2011-08-14 VITALS — BP 140/96 | HR 72 | Ht 62.0 in | Wt 296.0 lb

## 2011-08-14 DIAGNOSIS — I10 Essential (primary) hypertension: Secondary | ICD-10-CM

## 2011-08-14 DIAGNOSIS — J45909 Unspecified asthma, uncomplicated: Secondary | ICD-10-CM

## 2011-08-14 DIAGNOSIS — E119 Type 2 diabetes mellitus without complications: Secondary | ICD-10-CM

## 2011-08-14 LAB — POCT GLYCOSYLATED HEMOGLOBIN (HGB A1C): Hemoglobin A1C: 5.9

## 2011-08-14 MED ORDER — METHYLDOPA 250 MG PO TABS: 250.0000 mg | ORAL_TABLET | Freq: Three times a day (TID) | ORAL | Status: DC

## 2011-08-14 NOTE — Patient Instructions (Signed)
High blood pressure: change from labetalol to Aldomet, given headaches from labetalol Start at 250 mg twice daily.  After a week, if BP still >140/90, then increase to 2 tabs in morning, 1 in evening.  If after a week, BP still >140/90, then increase to 2 tabs twice daily.    Reschedule sleep study Schedule annual diabetic eye exam Annual flu shot and pneumovax every 7-10 years is recommended  Return in 3 weeks with list of blood pressures
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
## 2011-08-16 ENCOUNTER — Ambulatory Visit (HOSPITAL_BASED_OUTPATIENT_CLINIC_OR_DEPARTMENT_OTHER): Payer: PRIVATE HEALTH INSURANCE | Attending: Family Medicine

## 2011-08-16 VITALS — Ht 62.0 in | Wt 296.0 lb

## 2011-08-16 DIAGNOSIS — G4733 Obstructive sleep apnea (adult) (pediatric): Secondary | ICD-10-CM | POA: Insufficient documentation

## 2011-08-16 DIAGNOSIS — G4730 Sleep apnea, unspecified: Secondary | ICD-10-CM

## 2011-08-16 DIAGNOSIS — G4737 Central sleep apnea in conditions classified elsewhere: Secondary | ICD-10-CM | POA: Insufficient documentation

## 2011-08-23 DIAGNOSIS — G4737 Central sleep apnea in conditions classified elsewhere: Secondary | ICD-10-CM

## 2011-08-23 DIAGNOSIS — R0609 Other forms of dyspnea: Secondary | ICD-10-CM

## 2011-08-23 DIAGNOSIS — R0989 Other specified symptoms and signs involving the circulatory and respiratory systems: Secondary | ICD-10-CM

## 2011-08-23 DIAGNOSIS — G4733 Obstructive sleep apnea (adult) (pediatric): Secondary | ICD-10-CM

## 2011-08-23 NOTE — Procedures (Signed)
NAME:  Victoria [name], Victoria [name]              ACCOUNT NO.:  [id number]  MEDICAL RECORD NO.:  [id number]          PATIENT TYPE:  OUT  LOCATION:  SLEEP CENTER                 FACILITY:  [facility]  PHYSICIAN:  Margo Lama D. Maple Hudson, MD, FCCP, FACPDATE OF BIRTH:  05/24/83  DATE OF STUDY:  08/16/2011                           NOCTURNAL POLYSOMNOGRAM  REFERRING PHYSICIAN:  Lavonda Jumbo, M.D.  REFERRING PHYSICIAN:  Lavonda Jumbo, MD  INDICATION FOR STUDY:  Hypersomnia with sleep apnea.  EPWORTH SLEEPINESS SCORE:  5/24.  BMI 54.1, weight 296 pounds, height 62 inches, neck 17 inches.  MEDICATIONS:  Home medications are charted and reviewed.  SLEEP ARCHITECTURE:  Total sleep time 92 minutes with sleep efficiency 23.1%.  Stage I was 37%, stage II 61.4%, stage III 1.6%, REM absent. Sleep latency 64.5 minutes.  Awake after sleep onset 242.5 minutes. Arousal index 52.2.  She was asleep for only limited intervals between midnight and 1 a.m. and again between 4 and 5 a.m.  Significant obstructive apnea was associated with the sleep interval between 4 and 5 a.m.  RESPIRATORY DATA:  Apnea-hypopnea index (AHI) 58 per hour.  A total of 89 events were scored including 32 obstructive apneas, 21 central apneas, 2 mixed apneas, 34 hypopneas.  Events were seen in all sleep positions.  There is insufficient sleep to permit application of split protocol CPAP titration on this study night.  OXYGEN DATA:  Moderately loud snoring with oxygen desaturation to a nadir of 74% and mean oxygen saturation through the study of 92.7% on room air.  43.4 minutes were recorded with room air oxygen saturation less than 90%.  CARDIAC DATA:  Normal sinus rhythm.  MOVEMENT-PARASOMNIA:  No significant movement disturbance.  Bathroom x4.  IMPRESSIONS-RECOMMENDATIONS: 1. Significant difficulty initiating and maintaining sleep without     sleep medication on the study night.  Sleep efficiency was only     23.1%. 2. 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  Non-positional events with moderately loud snoring     and oxygen desaturation to a nadir of 74% with mean oxygen     saturation through the study of 92.7% on room air. 3. Consider return for dedicated CPAP titration study, bringing a     sleep medication if appropriate.  Otherwise, evaluate for     alternative management as circumstances require.     Deryck Hippler D. Maple Hudson, MD, [facility], FACP Diplomate, American Board of Sleep Medicine    CDY/MEDQ  D:  08/23/2011 16:10:96  T:  08/23/2011 10:59:03  Job:  [id number]

## 2011-09-01 ENCOUNTER — Encounter (HOSPITAL_COMMUNITY): Payer: Self-pay

## 2011-09-01 ENCOUNTER — Emergency Department (INDEPENDENT_AMBULATORY_CARE_PROVIDER_SITE_OTHER)
Admission: EM | Admit: 2011-09-01 | Discharge: 2011-09-01 | Disposition: A | Discharge status: Home / Self Care | Payer: PRIVATE HEALTH INSURANCE | Source: Home / Self Care | Attending: Emergency Medicine | Admitting: Emergency Medicine

## 2011-09-01 ENCOUNTER — Encounter: Payer: Self-pay | Admitting: Family Medicine

## 2011-09-01 DIAGNOSIS — G4733 Obstructive sleep apnea (adult) (pediatric): Secondary | ICD-10-CM | POA: Insufficient documentation

## 2011-09-01 DIAGNOSIS — I10 Essential (primary) hypertension: Secondary | ICD-10-CM

## 2011-09-01 DIAGNOSIS — S239XXA Sprain of unspecified parts of thorax, initial encounter: Secondary | ICD-10-CM

## 2011-09-01 DIAGNOSIS — S29012A Strain of muscle and tendon of back wall of thorax, initial encounter: Secondary | ICD-10-CM

## 2011-09-01 HISTORY — DX: Sprain of unspecified ligament of left ankle, initial encounter: S93.402A

## 2011-09-01 MED ORDER — METHOCARBAMOL 500 MG PO TABS: 500.0000 mg | ORAL_TABLET | Freq: Four times a day (QID) | ORAL | Status: AC

## 2011-09-01 MED ORDER — DICLOFENAC SODIUM 1 % TD GEL: 1.0000 "application " | Freq: Four times a day (QID) | TRANSDERMAL | Status: DC

## 2011-09-01 NOTE — ED Notes (Signed)
Pt states she twisted to catch pt at work last night and is having upper back pain. States pain is not as bad as it was, rates it a 2 on the pain scale of 1-10.  Pt works at [employer].

## 2011-09-01 NOTE — Discharge Instructions (Signed)
Take the medication as written. Take 1 gram of tylenol  to 4 times a day as needed for pain. This with the diclofenac is an effective combination for pain. Return if you get worse, have a  fever >100.4, or for any concerns.   Decrease your salt intake. diet and exercise will lower your blood pressure significantly. Continue keeping a record of your pressures. It is important to keep your blood pressure under good control, as having a elevated for prolonged periods of time significantly increases your risk of stroke, heart attacks, kidney damage, eye damage, and other problems. Return immediately to the ER if you start having chest pain, headache, problems seeing, problems talking, problems walking, if you feel like you're about to pass out, if you do pass out, if you have a seizure, or for any other concerns..  Go to www.goodrx.com to look up your medications. This will give you a list of where you can find your prescriptions at the most affordable prices.
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
## 2011-09-04 ENCOUNTER — Ambulatory Visit (INDEPENDENT_AMBULATORY_CARE_PROVIDER_SITE_OTHER): Payer: PRIVATE HEALTH INSURANCE | Admitting: Family Medicine

## 2011-09-04 ENCOUNTER — Encounter: Payer: Self-pay | Admitting: Family Medicine

## 2011-09-04 VITALS — BP 132/92 | HR 80 | Ht 62.0 in | Wt 299.0 lb

## 2011-09-04 DIAGNOSIS — G4733 Obstructive sleep apnea (adult) (pediatric): Secondary | ICD-10-CM

## 2011-09-04 DIAGNOSIS — I10 Essential (primary) hypertension: Secondary | ICD-10-CM

## 2011-09-04 MED ORDER — METHYLDOPA 500 MG PO TABS: 500.0000 mg | ORAL_TABLET | Freq: Two times a day (BID) | ORAL | Status: DC
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
## 2011-09-04 NOTE — Patient Instructions (Signed)
We are increasing your medication to 500 mg twice daily.  Continue to check your blood pressures, follow low sodium diet, exercise daily.  We are trying to arrange for CPAP titration sleep study--if you don't hear from anybody to schedule this please let me know.

## 2011-09-19 ENCOUNTER — Inpatient Hospital Stay (HOSPITAL_COMMUNITY): Payer: PRIVATE HEALTH INSURANCE

## 2011-09-19 ENCOUNTER — Inpatient Hospital Stay (HOSPITAL_COMMUNITY)
Admission: AD | Admit: 2011-09-19 | Discharge: 2011-09-19 | Disposition: A | Discharge status: Home / Self Care | Payer: PRIVATE HEALTH INSURANCE | Source: Ambulatory Visit | Attending: Obstetrics & Gynecology | Admitting: Obstetrics & Gynecology

## 2011-09-19 ENCOUNTER — Encounter (HOSPITAL_COMMUNITY): Payer: Self-pay | Admitting: *Deleted

## 2011-09-19 DIAGNOSIS — N390 Urinary tract infection, site not specified: Secondary | ICD-10-CM | POA: Diagnosis present

## 2011-09-19 DIAGNOSIS — R1032 Left lower quadrant pain: Secondary | ICD-10-CM | POA: Insufficient documentation

## 2011-09-19 DIAGNOSIS — N760 Acute vaginitis: Secondary | ICD-10-CM | POA: Diagnosis present

## 2011-09-19 DIAGNOSIS — A499 Bacterial infection, unspecified: Secondary | ICD-10-CM | POA: Insufficient documentation

## 2011-09-19 DIAGNOSIS — D279 Benign neoplasm of unspecified ovary: Secondary | ICD-10-CM | POA: Diagnosis present

## 2011-09-19 DIAGNOSIS — B9689 Other specified bacterial agents as the cause of diseases classified elsewhere: Secondary | ICD-10-CM | POA: Insufficient documentation

## 2011-09-19 LAB — URINALYSIS, ROUTINE W REFLEX MICROSCOPIC
Bilirubin Urine: NEGATIVE
Leukocytes, UA: NEGATIVE
Nitrite: POSITIVE — AB
Specific Gravity, Urine: 1.015 (ref 1.005–1.030)
Urobilinogen, UA: 0.2 mg/dL (ref 0.0–1.0)
pH: 6.5 (ref 5.0–8.0)

## 2011-09-19 LAB — POCT PREGNANCY, URINE: Preg Test, Ur: NEGATIVE

## 2011-09-19 LAB — WET PREP, GENITAL

## 2011-09-19 MED ORDER — SULFAMETHOXAZOLE-TMP DS 800-160 MG PO TABS: 1.0000 | ORAL_TABLET | Freq: Two times a day (BID) | ORAL | Status: AC

## 2011-09-19 MED ORDER — METRONIDAZOLE 500 MG PO TABS: 500.0000 mg | ORAL_TABLET | Freq: Two times a day (BID) | ORAL | Status: AC

## 2011-09-19 NOTE — MAU Provider Note (Signed)
Attestation of Attending Supervision of Advanced Practitioner: Evaluation and management procedures were performed by the OB Fellow/PA/CNM/NP under my supervision and collaboration. Chart reviewed, and agree with management and plan.  Donyea Gafford, M.D. 09/19/2011 10:07 PM   

## 2011-09-19 NOTE — MAU Note (Signed)
Pt states has dermoid cyst on her left ovary, having low back pain as well as lower abdominal pain that began this am, back pain rates 6/10, lower abd pain rates 7/10. Notes increased vaginal discharge, thin/clear/non-odorous.

## 2011-09-19 NOTE — Discharge Instructions (Signed)
Bacterial Vaginosis Bacterial vaginosis (BV) is a vaginal infection where the normal balance of bacteria in the vagina is disrupted. The normal balance is then replaced by an overgrowth of certain bacteria. There are several different kinds of bacteria that can cause BV. BV is the most common vaginal infection in women of childbearing age. CAUSES   The cause of BV is not fully understood. BV develops when there is an increase or imbalance of harmful bacteria.   Some activities or behaviors can upset the normal balance of bacteria in the vagina and put women at increased risk including:   Having a new sex partner or multiple sex partners.   Douching.   Using an intrauterine device (IUD) for contraception.   It is not clear what role sexual activity plays in the development of BV. However, women that have never had sexual intercourse are rarely infected with BV.  Women do not get BV from toilet seats, bedding, swimming pools or from touching objects around them.  SYMPTOMS   Grey vaginal discharge.   A fish-like odor with discharge, especially after sexual intercourse.   Itching or burning of the vagina and vulva.   Burning or pain with urination.   Some women have no signs or symptoms at all.  DIAGNOSIS  Your caregiver must examine the vagina for signs of BV. Your caregiver will perform lab tests and look at the sample of vaginal fluid through a microscope. They will look for bacteria and abnormal cells (clue cells), a pH test higher than 4.5, and a positive amine test all associated with BV.  RISKS AND COMPLICATIONS   Pelvic inflammatory disease (PID).   Infections following gynecology surgery.   Developing HIV.   Developing herpes virus.  TREATMENT  Sometimes BV will clear up without treatment. However, all women with symptoms of BV should be treated to avoid complications, especially if gynecology surgery is planned. Female partners generally do not need to be treated. However,  BV may spread between female sex partners so treatment is helpful in preventing a recurrence of BV.   BV may be treated with antibiotics. The antibiotics come in either pill or vaginal cream forms. Either can be used with nonpregnant or pregnant women, but the recommended dosages differ. These antibiotics are not harmful to the baby.   BV can recur after treatment. If this happens, a second round of antibiotics will often be prescribed.   Treatment is important for pregnant women. If not treated, BV can cause a premature delivery, especially for a pregnant woman who had a premature birth in the past. All pregnant women who have symptoms of BV should be checked and treated.   For chronic reoccurrence of BV, treatment with a type of prescribed gel vaginally twice a week is helpful.  HOME CARE INSTRUCTIONS   Finish all medication as directed by your caregiver.   Do not have sex until treatment is completed.   Tell your sexual partner that you have a vaginal infection. They should see their caregiver and be treated if they have problems, such as a mild rash or itching.   Practice safe sex. Use condoms. Only have 1 sex partner.  PREVENTION  Basic prevention steps can help reduce the risk of upsetting the natural balance of bacteria in the vagina and developing BV:  Do not have sexual intercourse (be abstinent).   Do not douche.   Use all of the medicine prescribed for treatment of BV, even if the signs and symptoms go away.     Tell your sex partner if you have BV. That way, they can be treated, if needed, to prevent reoccurrence.  SEEK MEDICAL CARE IF:   Your symptoms are not improving after 3 days of treatment.   You have increased discharge, pain, or fever.  MAKE SURE YOU:   Understand these instructions.   Will watch your condition.   Will get help right away if you are not doing well or get worse.  FOR MORE INFORMATION  Division of STD Prevention (DSTDP), Centers for Disease  Control and Prevention: www.cdc.gov/std American [employer] Health Association (ASHA): www.ashastd.org  Document Released: 05/13/2005 Document Revised: 05/02/2011 Document Reviewed: 11/03/2008 ExitCare Patient Information 2012 [employer].Urinary Tract Infection Infections of the urinary tract can start in several places. A bladder infection (cystitis), a kidney infection (pyelonephritis), and a prostate infection (prostatitis) are different types of urinary tract infections (UTIs). They usually get better if treated with medicines (antibiotics) that kill germs. Take all the medicine until it is gone. You or your child may feel better in a few days, but TAKE ALL MEDICINE or the infection may not respond and may become more difficult to treat. HOME CARE INSTRUCTIONS   Drink enough water and fluids to keep the urine clear or pale yellow. Cranberry juice is especially recommended, in addition to large amounts of water.   Avoid caffeine, tea, and carbonated beverages. They tend to irritate the bladder.   Alcohol may irritate the prostate.   Only take over-the-counter or prescription medicines for pain, discomfort, or fever as directed by your caregiver.  To prevent further infections:  Empty the bladder often. Avoid holding urine for long periods of time.   After a bowel movement, women should cleanse from front to back. Use each tissue only once.   Empty the bladder before and after sexual intercourse.  FINDING OUT THE RESULTS OF YOUR TEST Not all test results are available during your visit. If your or your child's test results are not back during the visit, make an appointment with your caregiver to find out the results. Do not assume everything is normal if you have not heard from your caregiver or the medical facility. It is important for you to follow up on all test results. SEEK MEDICAL CARE IF:   There is back pain.   Your baby is older than 3 months with a rectal temperature of 100.5  F (38.1 C) or higher for more than 1 day.   Your or your child's problems (symptoms) are no better in 3 days. Return sooner if you or your child is getting worse.  SEEK IMMEDIATE MEDICAL CARE IF:   There is severe back pain or lower abdominal pain.   You or your child develops chills.   You have a fever.   Your baby is older than 3 months with a rectal temperature of 102 F (38.9 C) or higher.   Your baby is 3 months old or younger with a rectal temperature of 100.4 F (38 C) or higher.   There is nausea or vomiting.   There is continued burning or discomfort with urination.  MAKE SURE YOU:   Understand these instructions.   Will watch your condition.   Will get help right away if you are not doing well or get worse.  Document Released: 02/20/2005 Document Revised: 05/02/2011 Document Reviewed: 09/25/2006 ExitCare Patient Information 2012 [employer]. 
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
## 2011-09-19 NOTE — MAU Note (Signed)
Pt states she was dx'd with dermoid cyst by Dr. Stefano Gaul several years ago.

## 2011-09-20 LAB — GC/CHLAMYDIA PROBE AMP, GENITAL: Chlamydia, DNA Probe: NEGATIVE

## 2011-09-24 ENCOUNTER — Ambulatory Visit (HOSPITAL_BASED_OUTPATIENT_CLINIC_OR_DEPARTMENT_OTHER): Payer: PRIVATE HEALTH INSURANCE

## 2011-09-30 ENCOUNTER — Emergency Department (HOSPITAL_COMMUNITY)
Admission: EM | Admit: 2011-09-30 | Discharge: 2011-09-30 | Disposition: A | Discharge status: Home / Self Care | Payer: PRIVATE HEALTH INSURANCE | Attending: Emergency Medicine | Admitting: Emergency Medicine

## 2011-09-30 ENCOUNTER — Encounter (HOSPITAL_COMMUNITY): Payer: Self-pay | Admitting: Emergency Medicine

## 2011-09-30 DIAGNOSIS — E119 Type 2 diabetes mellitus without complications: Secondary | ICD-10-CM | POA: Insufficient documentation

## 2011-09-30 DIAGNOSIS — I10 Essential (primary) hypertension: Secondary | ICD-10-CM | POA: Insufficient documentation

## 2011-09-30 DIAGNOSIS — J020 Streptococcal pharyngitis: Secondary | ICD-10-CM | POA: Insufficient documentation

## 2011-09-30 DIAGNOSIS — R6883 Chills (without fever): Secondary | ICD-10-CM | POA: Insufficient documentation

## 2011-09-30 LAB — RAPID STREP SCREEN (MED CTR MEBANE ONLY): Streptococcus, Group A Screen (Direct): POSITIVE — AB

## 2011-09-30 MED ORDER — PENICILLIN G BENZATHINE 1200000 UNIT/2ML IM SUSP
1.2000 10*6.[IU] | INTRAMUSCULAR | Status: AC
  Administered 2011-09-30: 1.2 10*6.[IU] via INTRAMUSCULAR
  Filled 2011-09-30: qty 2

## 2011-09-30 NOTE — ED Provider Notes (Signed)
Medical screening examination/treatment/procedure(s) were performed by non-physician practitioner and as supervising physician I was immediately available for consultation/collaboration.   Dione Booze, MD 09/30/11 [phone]

## 2011-09-30 NOTE — Discharge Instructions (Signed)
Strep Throat Strep throat is an infection of the throat caused by a bacteria named Streptococcus pyogenes. Your caregiver may call the infection streptococcal "tonsillitis" or "pharyngitis" depending on whether there are signs of inflammation in the tonsils or back of the throat. Strep throat is most common in children from 5 to 29 years old during the cold months of the year, but it can occur in people of any age during any season. This infection is spread from person to person (contagious) through coughing, sneezing, or other close contact. SYMPTOMS   Fever or chills.   Painful, swollen, red tonsils or throat.   Pain or difficulty when swallowing.   White or yellow spots on the tonsils or throat.   Swollen, tender lymph nodes or "glands" of the neck or under the jaw.   Red rash all over the body (rare).  DIAGNOSIS  Many different infections can cause the same symptoms. A test must be done to confirm the diagnosis so the right treatment can be given. A "rapid strep test" can help your caregiver make the diagnosis in a few minutes. If this test is not available, a light swab of the infected area can be used for a throat culture test. If a throat culture test is done, results are usually available in a day or two. TREATMENT  Strep throat is treated with antibiotic medicine. HOME CARE INSTRUCTIONS   Gargle with 1 tsp of salt in 1 cup of warm water, 3 to 4 times per day or as needed for comfort.   Family members who also have a sore throat or fever should be tested for strep throat and treated with antibiotics if they have the strep infection.   Make sure everyone in your household washes their hands well.   Do not share food, drinking cups, or personal items that could cause the infection to spread to others.   You may need to eat a soft food diet until your sore throat gets better.   Drink enough water and fluids to keep your urine clear or pale yellow. This will help prevent  dehydration.   Get plenty of rest.   Stay home from school, daycare, or work until you have been on antibiotics for 24 hours.   Only take over-the-counter or prescription medicines for pain, discomfort, or fever as directed by your caregiver.   If antibiotics are prescribed, take them as directed. Finish them even if you start to feel better.  SEEK MEDICAL CARE IF:   The glands in your neck continue to enlarge.   You develop a rash, cough, or earache.   You cough up green, yellow-brown, or bloody sputum.   You have pain or discomfort not controlled by medicines.   Your problems seem to be getting worse rather than better.  SEEK IMMEDIATE MEDICAL CARE IF:   You develop any new symptoms such as vomiting, severe headache, stiff or painful neck, chest pain, shortness of breath, or trouble swallowing.   You develop severe throat pain, drooling, or changes in your voice.   You develop swelling of the neck, or the skin on the neck becomes red and tender.   You have a fever.   You develop signs of dehydration, such as fatigue, dry mouth, and decreased urination.   You become increasingly sleepy, or you cannot wake up completely.  Document Released: 05/10/2000 Document Revised: 05/02/2011 Document Reviewed: 07/12/2010 ExitCare Patient Information 2012 [employer].Salt Water Gargle This solution will help make your mouth and throat   feel better. HOME CARE INSTRUCTIONS   Mix 1 teaspoon of salt in 8 ounces of warm water.   Gargle with this solution as much or often as you need or as directed. Swish and gargle gently if you have any sores or wounds in your mouth.   Do not swallow this mixture.  Document Released: 02/15/2004 Document Revised: 05/02/2011 Document Reviewed: 07/08/2008 ExitCare Patient Information 2012 [employer]. 
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
## 2011-09-30 NOTE — ED Notes (Signed)
Started to have sorethroat  Since yesterday hurts to swollow and it feels swollen

## 2011-10-01 ENCOUNTER — Ambulatory Visit (INDEPENDENT_AMBULATORY_CARE_PROVIDER_SITE_OTHER): Payer: PRIVATE HEALTH INSURANCE | Admitting: Medical

## 2011-10-01 ENCOUNTER — Encounter: Payer: Self-pay | Admitting: Medical

## 2011-10-01 VITALS — BP 116/90 | HR 100 | Temp 98.1°F | Resp 14 | Wt 294.0 lb

## 2011-10-01 DIAGNOSIS — J069 Acute upper respiratory infection, unspecified: Secondary | ICD-10-CM

## 2011-10-01 DIAGNOSIS — J020 Streptococcal pharyngitis: Secondary | ICD-10-CM

## 2011-10-01 NOTE — Patient Instructions (Signed)
You have strep throat and a respiratory infection:  Recommendations:  Rest  Drink plenty of water  Consider Mucinex DM or Robitussin DM for cough/congestion  Consider nasal saline flush (1tsp salt in 1 cup of warm water), use a bulb syringe to flush this in each nostril  Start your Bactrim antibiotic you have at home as this will likely treat the respiratory and the urinary infection  Use your albuterol as needed every 4-6 hours  If not much better by end of the week call or return.
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
## 2011-10-04 ENCOUNTER — Telehealth: Payer: Self-pay | Admitting: Internal Medicine
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
## 2011-10-15 ENCOUNTER — Telehealth: Payer: Self-pay | Admitting: Family Medicine

## 2011-10-15 ENCOUNTER — Ambulatory Visit (INDEPENDENT_AMBULATORY_CARE_PROVIDER_SITE_OTHER): Payer: PRIVATE HEALTH INSURANCE | Admitting: Medical

## 2011-10-15 ENCOUNTER — Encounter: Payer: Self-pay | Admitting: Medical

## 2011-10-15 ENCOUNTER — Other Ambulatory Visit: Payer: Self-pay | Admitting: Medical

## 2011-10-15 VITALS — BP 120/88 | HR 84 | Temp 98.1°F | Resp 16 | Wt 294.0 lb

## 2011-10-15 DIAGNOSIS — R110 Nausea: Secondary | ICD-10-CM

## 2011-10-15 DIAGNOSIS — N6452 Nipple discharge: Secondary | ICD-10-CM

## 2011-10-15 DIAGNOSIS — N6459 Other signs and symptoms in breast: Secondary | ICD-10-CM

## 2011-10-15 NOTE — Telephone Encounter (Signed)
PATIENT WAS NOTIFIED OF HER APPOINTMENT TODAY 10/15/11 @ 405 PM. C[facility] DR. Seymour Bars 10/16/11 @ 830 AM [id number] I FAX OVER OV NOTES AND INSURANCE CARD THROUGH [facility]. CLS
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
## 2011-10-16 ENCOUNTER — Ambulatory Visit (INDEPENDENT_AMBULATORY_CARE_PROVIDER_SITE_OTHER): Payer: PRIVATE HEALTH INSURANCE | Admitting: Family Medicine

## 2011-10-16 ENCOUNTER — Telehealth: Payer: Self-pay | Admitting: Family Medicine

## 2011-10-16 ENCOUNTER — Ambulatory Visit (HOSPITAL_BASED_OUTPATIENT_CLINIC_OR_DEPARTMENT_OTHER): Payer: PRIVATE HEALTH INSURANCE | Attending: Family Medicine | Admitting: Radiology

## 2011-10-16 ENCOUNTER — Encounter: Payer: Self-pay | Admitting: Family Medicine

## 2011-10-16 VITALS — BP 130/84 | HR 94 | Ht 64.0 in | Wt 295.0 lb

## 2011-10-16 VITALS — Ht 63.0 in | Wt 295.0 lb

## 2011-10-16 DIAGNOSIS — I10 Essential (primary) hypertension: Secondary | ICD-10-CM

## 2011-10-16 DIAGNOSIS — Z9989 Dependence on other enabling machines and devices: Secondary | ICD-10-CM

## 2011-10-16 DIAGNOSIS — G4733 Obstructive sleep apnea (adult) (pediatric): Secondary | ICD-10-CM

## 2011-10-16 DIAGNOSIS — G4730 Sleep apnea, unspecified: Secondary | ICD-10-CM | POA: Insufficient documentation

## 2011-10-16 MED ORDER — METHYLDOPA 500 MG PO TABS: 500.0000 mg | ORAL_TABLET | Freq: Two times a day (BID) | ORAL | Status: DC
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
## 2011-10-16 NOTE — Telephone Encounter (Signed)
Message copied by Janeice Robinson on Wed Oct 16, 2011  8:20 AM ------      Message from: Jac Canavan      Created: Tue Oct 15, 2011  8:17 PM       Send my OV note from Tuesday to Dr. Seymour Bars - gyn

## 2011-10-16 NOTE — Patient Instructions (Signed)
Try and exercise daily.  Try and lose weight.  Low sodium diet.  Continue to monitor BP at home regularly (goal <130/80). We will contact you with sleep study results, and please contact us if you are having any problems getting the CPAP set up, or in tolerating it, for treatment of sleep apnea.  Call if you haven't heard anything in 2 weeks.

## 2011-10-16 NOTE — Telephone Encounter (Signed)
I RE-FAX OVER THE OFFICE VISIT NOTES TO DR. Jonny Ruiz AT [facility] TODAY 10/16/11. CLS

## 2011-10-20 DIAGNOSIS — G4730 Sleep apnea, unspecified: Secondary | ICD-10-CM

## 2011-10-21 NOTE — Procedures (Signed)
NAME:  Victoria [name], Victoria [name]              ACCOUNT NO.:  [id number]  MEDICAL RECORD NO.:  [id number]          PATIENT TYPE:  OUT  LOCATION:  SLEEP CENTER                 FACILITY:  [facility]  PHYSICIAN:  Efraim Vanallen D. Maple Hudson, MD, FCCP, FACPDATE OF BIRTH:  03[birth year]  DATE OF STUDY:  10/16/2011                           NOCTURNAL POLYSOMNOGRAM  REFERRING PHYSICIAN:  Lavonda Jumbo, M.D.  INDICATION FOR STUDY:  Hypersomnia with sleep apnea.  EPWORTH SLEEPINESS SCORE:  2/24.  BMI 54.1, weight 296 pounds, height 62 inches, neck 17 inch.  MEDICATIONS:  Home medications are charted and reviewed.  A baseline diagnostic NPSG on August 16, 2011 recorded an AHI of 58 per hour.  She weighed 296 pounds for that study.  SLEEP ARCHITECTURE:  Total sleep time 387.5 minutes with sleep efficiency 78.4%.  Stage I was 5.4%, stage II 70.6%, stage III 1.7%, REM 22.3% of total sleep time.  Sleep latency 11.5 minutes, REM latency 164 minutes.  Awake after sleep onset 16.5 minutes.  Arousal index 15.9.  BEDTIME MEDICATION:  Ibuprofen.  RESPIRATORY DATA:  CPAP titration protocol.  CPAP was titrated to 18 CWP, AHI 0.9 per hour.  She wore a medium ResMed Mirage Quattro full- face mask with heated humidifier and a C-Flex of 3.  OXYGEN DATA:  With CPAP, snoring was prevented and mean oxygen saturation held 95.8% on room air.  CARDIAC DATA:  Sinus rhythm with occasional sinus pause and PAC.  MOVEMENT-PARASOMNIA:  No significant movement disturbance.  Bathroom x2.  IMPRESSIONS-RECOMMENDATIONS: 1. Successful continuous positive airway pressure titration to 18 CWP,     apnea-hypopnea index 0.9 per hour.  She wore a medium ResMed     Quattro full-face mask with heated humidity and a C-Flex setting of     3. 2. Baseline diagnostic nocturnal polysomnogram on August 16, 2011 had     recorded an AHI of 58 per hour with a body weight 296 pounds for     that study.     Jennice Renegar D. Maple Hudson, MD, [facility], FACP Diplomate, American  Board of Sleep Medicine    CDY/MEDQ  D:  10/20/2011 15:57:03  T:  10/21/2011 45:40:98  Job:  [id number]

## 2011-11-07 ENCOUNTER — Encounter: Payer: Self-pay | Admitting: Family Medicine

## 2012-02-19 ENCOUNTER — Ambulatory Visit: Payer: PRIVATE HEALTH INSURANCE | Admitting: Family Medicine

## 2012-02-29 ENCOUNTER — Encounter (HOSPITAL_COMMUNITY): Payer: Self-pay | Admitting: *Deleted

## 2012-02-29 DIAGNOSIS — R110 Nausea: Secondary | ICD-10-CM | POA: Insufficient documentation

## 2012-02-29 DIAGNOSIS — I10 Essential (primary) hypertension: Secondary | ICD-10-CM | POA: Insufficient documentation

## 2012-02-29 DIAGNOSIS — R42 Dizziness and giddiness: Secondary | ICD-10-CM | POA: Insufficient documentation

## 2012-02-29 LAB — COMPREHENSIVE METABOLIC PANEL
ALT: 21 U/L (ref 0–35)
Alkaline Phosphatase: 53 U/L (ref 39–117)
BUN: 7 mg/dL (ref 6–23)
CO2: 24 mEq/L (ref 19–32)
GFR calc Af Amer: >90 mL/min (ref >90)
GFR calc non Af Amer: >90 mL/min (ref >90)
Glucose, Bld: 106 mg/dL — ABNORMAL HIGH (ref 70–99)
Potassium: 3.3 mEq/L — ABNORMAL LOW (ref 3.5–5.1)
Total Bilirubin: 0.2 mg/dL — ABNORMAL LOW (ref 0.3–1.2)
Total Protein: 6.8 g/dL (ref 6.0–8.3)

## 2012-02-29 LAB — CBC WITH DIFFERENTIAL/PLATELET
Eosinophils Absolute: 0.1 10*3/uL (ref 0.0–0.7)
Eosinophils Relative: 2 % (ref 0–5)
Hemoglobin: 12.4 g/dL (ref 12.0–15.0)
Lymphocytes Relative: 39 % (ref 12–46)
Lymphs Abs: 2.6 10*3/uL (ref 0.7–4.0)
MCH: 27.4 pg (ref 26.0–34.0)
MCV: 78.8 fL (ref 78.0–100.0)
Monocytes Relative: 7 % (ref 3–12)
Neutrophils Relative %: 53 % (ref 43–77)
RBC: 4.53 MIL/uL (ref 3.87–5.11)
WBC: 6.8 10*3/uL (ref 4.0–10.5)

## 2012-02-29 LAB — TROPONIN I: Troponin I: <0.3 ng/mL (ref <0.30)

## 2012-02-29 NOTE — ED Notes (Signed)
The pt is c/o dizziness and nausea for 3-4 days.  History of the same

## 2012-03-01 ENCOUNTER — Emergency Department (HOSPITAL_COMMUNITY)
Admission: EM | Admit: 2012-03-01 | Discharge: 2012-03-01 | Disposition: A | Discharge status: Home / Self Care | Payer: BC Managed Care – PPO | Attending: Emergency Medicine | Admitting: Emergency Medicine

## 2012-03-01 ENCOUNTER — Emergency Department (HOSPITAL_COMMUNITY): Payer: BC Managed Care – PPO

## 2012-03-01 DIAGNOSIS — R42 Dizziness and giddiness: Secondary | ICD-10-CM

## 2012-03-01 LAB — URINALYSIS, ROUTINE W REFLEX MICROSCOPIC
Bilirubin Urine: NEGATIVE
Leukocytes, UA: NEGATIVE
Nitrite: NEGATIVE
Specific Gravity, Urine: 1.028 (ref 1.005–1.030)
pH: 5.5 (ref 5.0–8.0)

## 2012-03-01 MED ORDER — ONDANSETRON HCL 4 MG/2ML IJ SOLN
4.0000 mg | Freq: Once | INTRAMUSCULAR | Status: AC
  Administered 2012-03-01: 4 mg via INTRAVENOUS
  Filled 2012-03-01: qty 2

## 2012-03-01 MED ORDER — MECLIZINE HCL 25 MG PO TABS
25.0000 mg | ORAL_TABLET | Freq: Once | ORAL | Status: AC
  Administered 2012-03-01: 25 mg via ORAL
  Filled 2012-03-01: qty 1

## 2012-03-01 MED ORDER — SODIUM CHLORIDE 0.9 % IV BOLUS (SEPSIS)
1000.0000 mL | Freq: Once | INTRAVENOUS | Status: AC
  Administered 2012-03-01: 1000 mL via INTRAVENOUS

## 2012-03-01 MED ORDER — POTASSIUM CHLORIDE CRYS ER 20 MEQ PO TBCR
20.0000 meq | EXTENDED_RELEASE_TABLET | Freq: Once | ORAL | Status: AC
  Administered 2012-03-01: 20 meq via ORAL
  Filled 2012-03-01: qty 1

## 2012-03-01 MED ORDER — SODIUM CHLORIDE 0.9 % IV SOLN
INTRAVENOUS | Status: DC
  Administered 2012-03-01: 03:00:00 via INTRAVENOUS

## 2012-03-01 MED ORDER — DIAZEPAM 5 MG PO TABS: 5.0000 mg | ORAL_TABLET | Freq: Two times a day (BID) | ORAL | Status: DC

## 2012-03-01 MED ORDER — LORAZEPAM 2 MG/ML IJ SOLN
1.0000 mg | Freq: Once | INTRAMUSCULAR | Status: AC
  Administered 2012-03-01: 1 mg via INTRAVENOUS
  Filled 2012-03-01: qty 1

## 2012-03-01 MED ORDER — MECLIZINE HCL 25 MG PO TABS: 25.0000 mg | ORAL_TABLET | Freq: Four times a day (QID) | ORAL | Status: DC

## 2012-03-01 MED ORDER — ONDANSETRON HCL 4 MG PO TABS: 4.0000 mg | ORAL_TABLET | Freq: Four times a day (QID) | ORAL | Status: DC

## 2012-03-01 NOTE — ED Notes (Signed)
Attempted to ambulate patient; patient stumbled tolerated poorly, stating that she was still really dizzy.  Patient placed back in bed; EDP notified.

## 2012-03-01 NOTE — ED Notes (Signed)
Patient currently sitting up in bed; no respiratory or acute distress noted.  Patient updated on plan of care; informed patient that we are currently waiting on EDP to come and talk about test results.  Patient has no other questions or concerns at this time; will continue to monitor.

## 2012-03-01 NOTE — ED Notes (Signed)
Patient reporting intermittent dizziness since Tuesday; worsening dizziness today.  Patient also complaining of nausea, abdominal pain, pressure in head, and loss of appetite.  Patient denies vomiting, diarrhea, and constipation.  Patient reports that she has had dizziness in the past, mostly related to her blood sugar and hypertension.  Patient alert and oriented x4; PERRL present.  Will continue to monitor.
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
## 2012-03-01 NOTE — ED Notes (Signed)
Patient currently asleep in bed; no respiratory or acute distress noted.  Family present at bedside; will continue to monitor. 

## 2012-03-01 NOTE — ED Notes (Signed)
Patient currently asleep in bed; no respiratory or acute distress noted.  Family present at bedside.  Will continue to monitor. 

## 2012-03-01 NOTE — ED Notes (Addendum)
Attempted IV access x2; unsuccessful.  Another RN to try.  Patient currently sitting up in bed; no respiratory or acute distress noted.  Patient updated on plan of care; informed patient that we are going to start some IV fluids and that the EDP has also ordered nausea medication.  Patient able to keep potassium tablet down at this time; will continue to monitor.

## 2012-03-01 NOTE — ED Notes (Signed)
CT done; results back at 0541.

## 2012-03-03 ENCOUNTER — Encounter: Payer: Self-pay | Admitting: Medical

## 2012-03-03 ENCOUNTER — Ambulatory Visit (INDEPENDENT_AMBULATORY_CARE_PROVIDER_SITE_OTHER): Payer: BC Managed Care – PPO | Admitting: Medical

## 2012-03-03 VITALS — BP 142/90 | HR 88 | Temp 98.2°F | Resp 16 | Wt 305.0 lb

## 2012-03-03 DIAGNOSIS — R42 Dizziness and giddiness: Secondary | ICD-10-CM

## 2012-03-03 DIAGNOSIS — E119 Type 2 diabetes mellitus without complications: Secondary | ICD-10-CM

## 2012-03-03 DIAGNOSIS — I10 Essential (primary) hypertension: Secondary | ICD-10-CM

## 2012-03-03 DIAGNOSIS — E876 Hypokalemia: Secondary | ICD-10-CM

## 2012-03-03 DIAGNOSIS — E669 Obesity, unspecified: Secondary | ICD-10-CM

## 2012-03-03 NOTE — Patient Instructions (Signed)
I recommend exercising most days of the week using a type of exercise that they would enjoy and stick to such as walking, running, swimming, hiking, biking, aerobics, etc.  40-60 minutes per day is recommended.  I recommend a healthy diet.    Do's:   whole grains such as whole grain pasta, rice, whole grains breads and whole grain cereals - limit to 2-3 servings maximum per day.    Eat 2-4 fruits daily  Eat beans at least once daily  Eat almonds in small quantities at least 3 days per week    If they eat meat, I recommend small portions of lean meats such as chicken, fish, and [place].  Eat as much NON corn and NON potato vegetables as they like, particularly raw or steamed  Drink several large glasses of water daily  Cautions:  Limit red meat  Limit corn and potatoes  Limit sweets, cake, pie, candy  Limit beer and alcohol  Avoid fried food, fast food, large portions  Avoid sugary drinks such as regular soda and sweet tea   Eat 3 meals daily, avoid skipping meals.  A good healthy smoothie recipe is listed below:  1 handful of spinach or kale  1 cup of fruit such as frozen fruit  You can add splenda or stevia to sweeten  Use either 1-1.5 cup of water or soy milk or skim milk   This makes about 200 calorie smoothie which is quite healthy  Set a goal of 1600-1800 calories maximum per day.  Use the My Fitness Pal app on the smart phone to track your exercise, diet, and to use to meet your calorie goals.
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
## 2012-03-19 ENCOUNTER — Other Ambulatory Visit: Payer: Self-pay | Admitting: Family Medicine

## 2012-03-19 NOTE — Telephone Encounter (Signed)
She canceled her med check with me for September. She recently saw [facility] for hospital (ER) follow-up, and BP was a little elevated then.  Looks like he recommended f/u in 1-2 months, but no appointment made.  Needs med check scheduled.  Okay to refill x 2 months, as is pended.  Will need OV for further refills, needs to schedule.

## 2012-03-19 NOTE — Telephone Encounter (Signed)
Is this okay?

## 2012-03-20 NOTE — Telephone Encounter (Signed)
Left message for patient informing her that medication was called in x 2months but she does need to call our office and schedule an OV for further refills, in 2 months will be fine.

## 2012-04-15 ENCOUNTER — Ambulatory Visit (INDEPENDENT_AMBULATORY_CARE_PROVIDER_SITE_OTHER): Payer: BC Managed Care – PPO | Admitting: Family Medicine

## 2012-04-15 ENCOUNTER — Encounter: Payer: Self-pay | Admitting: Family Medicine

## 2012-04-15 VITALS — BP 158/94 | HR 68 | Ht 64.0 in | Wt 295.0 lb

## 2012-04-15 DIAGNOSIS — E119 Type 2 diabetes mellitus without complications: Secondary | ICD-10-CM

## 2012-04-15 DIAGNOSIS — R5383 Other fatigue: Secondary | ICD-10-CM

## 2012-04-15 DIAGNOSIS — I10 Essential (primary) hypertension: Secondary | ICD-10-CM

## 2012-04-15 DIAGNOSIS — G4733 Obstructive sleep apnea (adult) (pediatric): Secondary | ICD-10-CM

## 2012-04-15 DIAGNOSIS — R5381 Other malaise: Secondary | ICD-10-CM

## 2012-04-15 MED ORDER — HYDROCHLOROTHIAZIDE 25 MG PO TABS: 25.0000 mg | ORAL_TABLET | Freq: Every day | ORAL | Status: DC

## 2012-04-15 MED ORDER — METHYLDOPA 500 MG PO TABS: 500.0000 mg | ORAL_TABLET | Freq: Two times a day (BID) | ORAL | Status: DC
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
## 2012-04-15 NOTE — Patient Instructions (Addendum)
Continue the methyldopa twice daily. ADD HCTZ once daily--take when you wake up.  Ensure to get banana, apple/orange/potato for potassium intake on a daily basis (1/2 banana; eat more if you are having muscle cramps, which is a sign of low potassium).  Try and exercise at least 30 minutes daily, and continue to lose weight. Schedule your eye exam. Re-consider flu and pneumonia vaccines

## 2012-04-16 ENCOUNTER — Other Ambulatory Visit: Payer: Self-pay | Admitting: *Deleted

## 2012-04-16 DIAGNOSIS — E559 Vitamin D deficiency, unspecified: Secondary | ICD-10-CM

## 2012-04-16 LAB — COMPREHENSIVE METABOLIC PANEL
BUN: 11 mg/dL (ref 6–23)
CO2: 28 mEq/L (ref 19–32)
Calcium: 9.1 mg/dL (ref 8.4–10.5)
Chloride: 107 mEq/L (ref 96–112)
Creat: 0.69 mg/dL (ref 0.50–1.10)

## 2012-04-16 LAB — TSH: TSH: 1.709 u[IU]/mL (ref 0.350–4.500)

## 2012-04-16 LAB — VITAMIN D 25 HYDROXY (VIT D DEFICIENCY, FRACTURES): Vit D, 25-Hydroxy: 23 ng/mL — ABNORMAL LOW (ref 30–89)

## 2012-04-16 LAB — LIPID PANEL
Cholesterol: 154 mg/dL (ref 0–200)
Triglycerides: 111 mg/dL (ref <150)
VLDL: 22 mg/dL (ref 0–40)

## 2012-04-16 LAB — HEMOGLOBIN A1C
Hgb A1c MFr Bld: 6 % — ABNORMAL HIGH (ref <5.7)
Mean Plasma Glucose: 126 mg/dL — ABNORMAL HIGH (ref <117)

## 2012-04-16 MED ORDER — ERGOCALCIFEROL 1.25 MG (50000 UT) PO CAPS: 50000.0000 [IU] | ORAL_CAPSULE | ORAL | Status: DC

## 2012-05-13 ENCOUNTER — Ambulatory Visit (INDEPENDENT_AMBULATORY_CARE_PROVIDER_SITE_OTHER): Payer: BC Managed Care – PPO | Admitting: Family Medicine

## 2012-05-13 ENCOUNTER — Encounter: Payer: Self-pay | Admitting: Family Medicine

## 2012-05-13 VITALS — BP 110/74 | HR 76 | Ht 64.0 in | Wt 293.0 lb

## 2012-05-13 DIAGNOSIS — E559 Vitamin D deficiency, unspecified: Secondary | ICD-10-CM

## 2012-05-13 DIAGNOSIS — E119 Type 2 diabetes mellitus without complications: Secondary | ICD-10-CM

## 2012-05-13 DIAGNOSIS — I10 Essential (primary) hypertension: Secondary | ICD-10-CM

## 2012-05-13 DIAGNOSIS — G4733 Obstructive sleep apnea (adult) (pediatric): Secondary | ICD-10-CM

## 2012-05-13 DIAGNOSIS — Z79899 Other long term (current) drug therapy: Secondary | ICD-10-CM

## 2012-05-13 LAB — BASIC METABOLIC PANEL
Calcium: 9 mg/dL (ref 8.4–10.5)
Creat: 0.67 mg/dL (ref 0.50–1.10)

## 2012-05-13 NOTE — Patient Instructions (Signed)
Continue to periodically monitor sugar; continue daily exercise and weight loss  Complete course of prescription Vitamin D, and change over to OTC Vitamin D 1000 IU daily when finished.
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
## 2012-05-14 ENCOUNTER — Encounter: Payer: Self-pay | Admitting: Family Medicine

## 2012-06-11 ENCOUNTER — Inpatient Hospital Stay (HOSPITAL_COMMUNITY): Payer: BC Managed Care – PPO

## 2012-06-11 ENCOUNTER — Inpatient Hospital Stay (HOSPITAL_COMMUNITY)
Admission: AD | Admit: 2012-06-11 | Discharge: 2012-06-11 | Disposition: A | Discharge status: Home / Self Care | Payer: BC Managed Care – PPO | Source: Ambulatory Visit | Attending: Obstetrics & Gynecology | Admitting: Obstetrics & Gynecology

## 2012-06-11 ENCOUNTER — Encounter (HOSPITAL_COMMUNITY): Payer: Self-pay | Admitting: *Deleted

## 2012-06-11 DIAGNOSIS — N930 Postcoital and contact bleeding: Secondary | ICD-10-CM

## 2012-06-11 DIAGNOSIS — O26859 Spotting complicating pregnancy, unspecified trimester: Secondary | ICD-10-CM | POA: Insufficient documentation

## 2012-06-11 DIAGNOSIS — O209 Hemorrhage in early pregnancy, unspecified: Secondary | ICD-10-CM

## 2012-06-11 HISTORY — DX: Urinary tract infection, site not specified: N39.0

## 2012-06-11 HISTORY — DX: Gestational diabetes mellitus in pregnancy, unspecified control: O24.419

## 2012-06-11 LAB — CBC
HCT: 35.9 % — ABNORMAL LOW (ref 36.0–46.0)
MCH: 26.9 pg (ref 26.0–34.0)
MCV: 80 fL (ref 78.0–100.0)
Platelets: 262 10*3/uL (ref 150–400)
RBC: 4.49 MIL/uL (ref 3.87–5.11)
RDW: 14.2 % (ref 11.5–15.5)

## 2012-06-11 LAB — URINALYSIS, ROUTINE W REFLEX MICROSCOPIC
Nitrite: NEGATIVE
Specific Gravity, Urine: 1.02 (ref 1.005–1.030)
Urobilinogen, UA: 0.2 mg/dL (ref 0.0–1.0)

## 2012-06-11 LAB — WET PREP, GENITAL
Clue Cells Wet Prep HPF POC: NONE SEEN
Trich, Wet Prep: NONE SEEN

## 2012-06-11 LAB — URINE MICROSCOPIC-ADD ON
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
## 2012-06-11 NOTE — MAU Note (Signed)
Post-coital bleeding. Slight cramping, not a new problem. No bleeding now when used restroom.

## 2012-06-11 NOTE — MAU Note (Signed)
Pt states she had sex 1 hour ago and that she has bright red spotting present now-is not wearing a pad

## 2012-06-12 LAB — GC/CHLAMYDIA PROBE AMP: GC Probe RNA: NEGATIVE

## 2012-06-13 ENCOUNTER — Inpatient Hospital Stay (HOSPITAL_COMMUNITY)
Admission: AD | Admit: 2012-06-13 | Discharge: 2012-06-13 | Disposition: A | Discharge status: Home / Self Care | Payer: BC Managed Care – PPO | Source: Ambulatory Visit | Attending: Obstetrics & Gynecology | Admitting: Obstetrics & Gynecology

## 2012-06-13 DIAGNOSIS — I10 Essential (primary) hypertension: Secondary | ICD-10-CM

## 2012-06-13 DIAGNOSIS — Z3201 Encounter for pregnancy test, result positive: Secondary | ICD-10-CM

## 2012-06-13 DIAGNOSIS — O10019 Pre-existing essential hypertension complicating pregnancy, unspecified trimester: Secondary | ICD-10-CM | POA: Insufficient documentation

## 2012-06-13 NOTE — ED Notes (Signed)
L/Leftwich-Kirby,CNM discussed results and f/u US. Pt  agreeable to plan.Marland Kitchen

## 2012-06-13 NOTE — MAU Provider Note (Signed)
Attestation of Attending Supervision of Advanced Practitioner (CNM/NP): Evaluation and management procedures were performed by the Advanced Practitioner under my supervision and collaboration. I have reviewed the Advanced Practitioner's note and chart, and I agree with the management and plan.  Gabryela Kimbrell H. 12:12 PM

## 2012-06-13 NOTE — MAU Note (Signed)
t here for follow up BHCG. Only complaint is headache.

## 2012-06-13 NOTE — MAU Provider Note (Signed)
S: 32 y.o. G3P1011 @[redacted]w[redacted]d  presents to MAU for follow up quant hcg.  She denies pain, vaginal bleeding, n/v, or fever/chills today.  She does have a mild h/a. She is currently taking Aldomet for HTN and stopped taking her HCTZ and is worried about her blood pressures in pregnancy.  Pressures have been 140s/80s before taking her meds in morning, and 130s/70s after taking meds per pt.   O: BP 137/81  Pulse 76  Temp 98.5 F (36.9 C) (Oral)  Resp 18  Ht 5\' 4"  (1.626 m)  Wt 135.172 kg (298 lb)  BMI 51.15 kg/m2  LMP 05/09/2012  Quant on 1/16: 1947   Results for orders placed during the hospital encounter of 06/13/12 (from the past 24 hour(s))  HCG, QUANTITATIVE, PREGNANCY     Status: Abnormal   Collection Time   06/13/12  8:42 AM      Component Value Range   hCG, Beta Chain, Quant, S 3764 (*) <5 mIU/mL   A: 1. Positive pregnancy test   2. Essential hypertension, benign   3.  Appropriate rise in quant hcg in 48 hours  P: D/C home with ectopic precautions Outpatient U/S ordered in 1 week F/U with [facility] as planned Discussed healthy diet and exercise in pregnancy Increase water intake daily Return to MAU as needed  Sharen Counter Certified Nurse-Midwife

## 2012-06-16 NOTE — MAU Provider Note (Signed)
Attestation of Attending Supervision of Advanced Practitioner (CNM/NP): Evaluation and management procedures were performed by the Advanced Practitioner under my supervision and collaboration.  I have reviewed the Advanced Practitioner's note and chart, and I agree with the management and plan.  Rhapsody Wolven 06/16/2012 11:31 AM

## 2012-06-19 ENCOUNTER — Inpatient Hospital Stay (HOSPITAL_COMMUNITY)
Admission: AD | Admit: 2012-06-19 | Discharge: 2012-06-19 | Disposition: A | Discharge status: Home / Self Care | Payer: BC Managed Care – PPO | Source: Ambulatory Visit | Attending: Obstetrics & Gynecology | Admitting: Obstetrics & Gynecology

## 2012-06-19 ENCOUNTER — Ambulatory Visit (HOSPITAL_COMMUNITY)
Admission: RE | Admit: 2012-06-19 | Discharge: 2012-06-19 | Disposition: A | Discharge status: Home / Self Care | Payer: BC Managed Care – PPO | Source: Ambulatory Visit | Attending: Advanced Practice Midwife | Admitting: Advanced Practice Midwife

## 2012-06-19 DIAGNOSIS — Z3490 Encounter for supervision of normal pregnancy, unspecified, unspecified trimester: Secondary | ICD-10-CM

## 2012-06-19 DIAGNOSIS — Z3201 Encounter for pregnancy test, result positive: Secondary | ICD-10-CM

## 2012-06-19 DIAGNOSIS — Z363 Encounter for antenatal screening for malformations: Secondary | ICD-10-CM

## 2012-06-19 DIAGNOSIS — Z09 Encounter for follow-up examination after completed treatment for conditions other than malignant neoplasm: Secondary | ICD-10-CM | POA: Insufficient documentation

## 2012-06-19 DIAGNOSIS — Z1389 Encounter for screening for other disorder: Secondary | ICD-10-CM

## 2012-06-19 DIAGNOSIS — O3680X0 Pregnancy with inconclusive fetal viability, not applicable or unspecified: Secondary | ICD-10-CM | POA: Insufficient documentation

## 2012-06-19 DIAGNOSIS — O200 Threatened abortion: Secondary | ICD-10-CM | POA: Insufficient documentation

## 2012-06-19 DIAGNOSIS — E669 Obesity, unspecified: Secondary | ICD-10-CM | POA: Insufficient documentation

## 2012-06-19 NOTE — MAU Note (Signed)
Pt states here for viability u/s results ,only, denies pain or bleeding.
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
## 2012-06-22 ENCOUNTER — Encounter (HOSPITAL_COMMUNITY): Payer: Self-pay | Admitting: *Deleted

## 2012-06-22 ENCOUNTER — Inpatient Hospital Stay (HOSPITAL_COMMUNITY)
Admission: AD | Admit: 2012-06-22 | Discharge: 2012-06-22 | Disposition: A | Discharge status: Home / Self Care | Payer: BC Managed Care – PPO | Source: Ambulatory Visit | Attending: Obstetrics and Gynecology | Admitting: Obstetrics and Gynecology

## 2012-06-22 DIAGNOSIS — O209 Hemorrhage in early pregnancy, unspecified: Secondary | ICD-10-CM | POA: Insufficient documentation

## 2012-06-22 NOTE — MAU Note (Signed)
Patient states she had heavy vaginal bleeding after intercourse. Denies pain. Patient has on a pad with a small amount of bright red bleeding.
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
## 2012-07-11 ENCOUNTER — Other Ambulatory Visit: Payer: Self-pay

## 2012-08-14 ENCOUNTER — Inpatient Hospital Stay (HOSPITAL_COMMUNITY)
Admission: AD | Admit: 2012-08-14 | Discharge: 2012-08-14 | Disposition: A | Discharge status: Home / Self Care | Payer: BC Managed Care – PPO | Source: Ambulatory Visit | Attending: Obstetrics and Gynecology | Admitting: Obstetrics and Gynecology

## 2012-08-14 ENCOUNTER — Encounter (HOSPITAL_COMMUNITY): Payer: Self-pay

## 2012-08-14 DIAGNOSIS — O10019 Pre-existing essential hypertension complicating pregnancy, unspecified trimester: Secondary | ICD-10-CM | POA: Insufficient documentation

## 2012-08-14 DIAGNOSIS — O10912 Unspecified pre-existing hypertension complicating pregnancy, second trimester: Secondary | ICD-10-CM

## 2012-08-14 DIAGNOSIS — R1013 Epigastric pain: Secondary | ICD-10-CM | POA: Insufficient documentation

## 2012-08-14 DIAGNOSIS — O99891 Other specified diseases and conditions complicating pregnancy: Secondary | ICD-10-CM | POA: Insufficient documentation

## 2012-08-14 DIAGNOSIS — R12 Heartburn: Secondary | ICD-10-CM

## 2012-08-14 DIAGNOSIS — O219 Vomiting of pregnancy, unspecified: Secondary | ICD-10-CM

## 2012-08-14 DIAGNOSIS — O210 Mild hyperemesis gravidarum: Secondary | ICD-10-CM

## 2012-08-14 LAB — URINALYSIS, ROUTINE W REFLEX MICROSCOPIC
Bilirubin Urine: NEGATIVE
Glucose, UA: NEGATIVE mg/dL
Ketones, ur: NEGATIVE mg/dL
Leukocytes, UA: NEGATIVE
pH: 6.5 (ref 5.0–8.0)

## 2012-08-14 MED ORDER — ONDANSETRON 4 MG PO TBDP
4.0000 mg | ORAL_TABLET | Freq: Once | ORAL | Status: AC
  Administered 2012-08-14: 4 mg via ORAL
  Filled 2012-08-14: qty 1

## 2012-08-14 MED ORDER — PROMETHAZINE HCL 12.5 MG PO TABS: 12.5000 mg | ORAL_TABLET | Freq: Four times a day (QID) | ORAL | Status: DC | PRN

## 2012-08-14 MED ORDER — ONDANSETRON 4 MG PO TBDP: 4.0000 mg | ORAL_TABLET | Freq: Four times a day (QID) | ORAL | Status: DC | PRN

## 2012-08-14 MED ORDER — FAMOTIDINE 40 MG PO TABS: 40.0000 mg | ORAL_TABLET | Freq: Every day | ORAL | Status: DC

## 2012-08-14 NOTE — MAU Note (Signed)
Patient states she has had nausea with this pregnancy but has not been able to keep anything down for the past 2 days. Having mid to upper abdominal pain.

## 2012-08-14 NOTE — MAU Note (Signed)
Pt denies diarrhea, has had a small amount of vomiting yesterday, feels more nausea and upper abdominal pain. Denies abnormal vaginal discharge or bleeding.
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
## 2012-09-04 ENCOUNTER — Encounter (HOSPITAL_COMMUNITY): Payer: Self-pay | Admitting: *Deleted

## 2012-09-04 ENCOUNTER — Emergency Department (HOSPITAL_COMMUNITY): Payer: BC Managed Care – PPO

## 2012-09-04 ENCOUNTER — Emergency Department (HOSPITAL_COMMUNITY)
Admission: EM | Admit: 2012-09-04 | Discharge: 2012-09-04 | Disposition: A | Discharge status: Home / Self Care | Payer: BC Managed Care – PPO | Attending: Emergency Medicine | Admitting: Emergency Medicine

## 2012-09-04 DIAGNOSIS — R059 Cough, unspecified: Secondary | ICD-10-CM | POA: Insufficient documentation

## 2012-09-04 DIAGNOSIS — Z8632 Personal history of gestational diabetes: Secondary | ICD-10-CM | POA: Insufficient documentation

## 2012-09-04 DIAGNOSIS — Z331 Pregnant state, incidental: Secondary | ICD-10-CM | POA: Insufficient documentation

## 2012-09-04 DIAGNOSIS — J45901 Unspecified asthma with (acute) exacerbation: Secondary | ICD-10-CM | POA: Insufficient documentation

## 2012-09-04 DIAGNOSIS — E669 Obesity, unspecified: Secondary | ICD-10-CM | POA: Insufficient documentation

## 2012-09-04 DIAGNOSIS — R05 Cough: Secondary | ICD-10-CM | POA: Insufficient documentation

## 2012-09-04 DIAGNOSIS — Z87442 Personal history of urinary calculi: Secondary | ICD-10-CM | POA: Insufficient documentation

## 2012-09-04 DIAGNOSIS — E119 Type 2 diabetes mellitus without complications: Secondary | ICD-10-CM | POA: Insufficient documentation

## 2012-09-04 DIAGNOSIS — Z8742 Personal history of other diseases of the female genital tract: Secondary | ICD-10-CM | POA: Insufficient documentation

## 2012-09-04 DIAGNOSIS — I10 Essential (primary) hypertension: Secondary | ICD-10-CM | POA: Insufficient documentation

## 2012-09-04 LAB — CBC WITH DIFFERENTIAL/PLATELET
Basophils Relative: 0 % (ref 0–1)
HCT: 31.3 % — ABNORMAL LOW (ref 36.0–46.0)
Hemoglobin: 11.1 g/dL — ABNORMAL LOW (ref 12.0–15.0)
MCH: 27.2 pg (ref 26.0–34.0)
MCHC: 35.5 g/dL (ref 30.0–36.0)
Monocytes Absolute: 0.6 10*3/uL (ref 0.1–1.0)
Monocytes Relative: 9 % (ref 3–12)
Neutro Abs: 4.7 10*3/uL (ref 1.7–7.7)

## 2012-09-04 LAB — POCT I-STAT, CHEM 8
BUN: <3 mg/dL — ABNORMAL LOW (ref 6–23)
Creatinine, Ser: 0.6 mg/dL (ref 0.50–1.10)
Glucose, Bld: 102 mg/dL — ABNORMAL HIGH (ref 70–99)
Hemoglobin: 10.9 g/dL — ABNORMAL LOW (ref 12.0–15.0)
Potassium: 3.5 mEq/L (ref 3.5–5.1)

## 2012-09-04 MED ORDER — IPRATROPIUM BROMIDE 0.02 % IN SOLN: 0.5000 mg | Freq: Once | RESPIRATORY_TRACT | Status: DC

## 2012-09-04 MED ORDER — ALBUTEROL SULFATE HFA 108 (90 BASE) MCG/ACT IN AERS
2.0000 | INHALATION_SPRAY | Freq: Once | RESPIRATORY_TRACT | Status: AC
  Administered 2012-09-04: 2 via RESPIRATORY_TRACT
  Filled 2012-09-04: qty 6.7

## 2012-09-04 MED ORDER — ALBUTEROL SULFATE (5 MG/ML) 0.5% IN NEBU
2.5000 mg | INHALATION_SOLUTION | Freq: Once | RESPIRATORY_TRACT | Status: AC
  Administered 2012-09-04: 2.5 mg via RESPIRATORY_TRACT
  Filled 2012-09-04: qty 0.5

## 2012-09-04 MED ORDER — ALBUTEROL SULFATE (5 MG/ML) 0.5% IN NEBU: 2.5000 mg | INHALATION_SOLUTION | RESPIRATORY_TRACT | Status: DC | PRN

## 2012-09-04 NOTE — ED Notes (Signed)
Patient with increasing sob x 3 days, patient with asthma hx
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
## 2012-09-04 NOTE — ED Provider Notes (Signed)
Medical screening examination/treatment/procedure(s) were performed by non-physician practitioner and as supervising physician I was immediately available for consultation/collaboration.  Flint Melter, MD 09/04/12 1728

## 2012-09-05 ENCOUNTER — Telehealth (HOSPITAL_COMMUNITY): Payer: Self-pay | Admitting: Emergency Medicine

## 2012-09-05 NOTE — ED Notes (Signed)
Pharmacy calling about Rx. °

## 2012-10-24 ENCOUNTER — Emergency Department (HOSPITAL_COMMUNITY)
Admission: EM | Admit: 2012-10-24 | Discharge: 2012-10-24 | Disposition: A | Discharge status: Home / Self Care | Payer: BC Managed Care – PPO | Attending: Emergency Medicine | Admitting: Emergency Medicine

## 2012-10-24 ENCOUNTER — Encounter (HOSPITAL_COMMUNITY): Payer: Self-pay | Admitting: *Deleted

## 2012-10-24 DIAGNOSIS — Y929 Unspecified place or not applicable: Secondary | ICD-10-CM | POA: Insufficient documentation

## 2012-10-24 DIAGNOSIS — Y9389 Activity, other specified: Secondary | ICD-10-CM | POA: Insufficient documentation

## 2012-10-24 DIAGNOSIS — O169 Unspecified maternal hypertension, unspecified trimester: Secondary | ICD-10-CM | POA: Insufficient documentation

## 2012-10-24 DIAGNOSIS — J45909 Unspecified asthma, uncomplicated: Secondary | ICD-10-CM | POA: Insufficient documentation

## 2012-10-24 DIAGNOSIS — S335XXA Sprain of ligaments of lumbar spine, initial encounter: Secondary | ICD-10-CM | POA: Insufficient documentation

## 2012-10-24 DIAGNOSIS — O24919 Unspecified diabetes mellitus in pregnancy, unspecified trimester: Secondary | ICD-10-CM | POA: Insufficient documentation

## 2012-10-24 DIAGNOSIS — X500XXA Overexertion from strenuous movement or load, initial encounter: Secondary | ICD-10-CM | POA: Insufficient documentation

## 2012-10-24 DIAGNOSIS — Z79899 Other long term (current) drug therapy: Secondary | ICD-10-CM | POA: Insufficient documentation

## 2012-10-24 DIAGNOSIS — Z87828 Personal history of other (healed) physical injury and trauma: Secondary | ICD-10-CM | POA: Insufficient documentation

## 2012-10-24 DIAGNOSIS — E669 Obesity, unspecified: Secondary | ICD-10-CM | POA: Insufficient documentation

## 2012-10-24 DIAGNOSIS — O9989 Other specified diseases and conditions complicating pregnancy, childbirth and the puerperium: Secondary | ICD-10-CM | POA: Insufficient documentation

## 2012-10-24 DIAGNOSIS — Z8742 Personal history of other diseases of the female genital tract: Secondary | ICD-10-CM | POA: Insufficient documentation

## 2012-10-24 DIAGNOSIS — Z8619 Personal history of other infectious and parasitic diseases: Secondary | ICD-10-CM | POA: Insufficient documentation

## 2012-10-24 DIAGNOSIS — IMO0002 Reserved for concepts with insufficient information to code with codable children: Secondary | ICD-10-CM | POA: Insufficient documentation

## 2012-10-24 DIAGNOSIS — Z87440 Personal history of urinary (tract) infections: Secondary | ICD-10-CM | POA: Insufficient documentation

## 2012-10-24 DIAGNOSIS — M549 Dorsalgia, unspecified: Secondary | ICD-10-CM

## 2012-10-24 LAB — URINALYSIS, ROUTINE W REFLEX MICROSCOPIC
Bilirubin Urine: NEGATIVE
Glucose, UA: NEGATIVE mg/dL
Hgb urine dipstick: NEGATIVE
Specific Gravity, Urine: 1.027 (ref 1.005–1.030)
Urobilinogen, UA: 1 mg/dL (ref 0.0–1.0)
pH: 6 (ref 5.0–8.0)

## 2012-10-24 LAB — URINE MICROSCOPIC-ADD ON

## 2012-10-24 MED ORDER — ACETAMINOPHEN 325 MG PO TABS
325.0000 mg | ORAL_TABLET | Freq: Once | ORAL | Status: AC
  Administered 2012-10-24: 325 mg via ORAL
  Filled 2012-10-24: qty 1

## 2012-10-24 MED ORDER — ACETAMINOPHEN 325 MG PO TABS
650.0000 mg | ORAL_TABLET | Freq: Once | ORAL | Status: AC
Start: 2012-10-24 — End: 2012-10-24
  Administered 2012-10-24: 325 mg via ORAL
  Filled 2012-10-24: qty 2
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
## 2012-10-24 NOTE — ED Notes (Signed)
Pt c/o lower left sided back pain that radiates into left buttocks into left thigh. Pt reports it started while she was getting ready work. Pt sts she is [redacted] weeks pregnant. Pt reports everything has been normal with the pregnancy so far. Pt denies abd pain and cramping. Pt in nad, skin warm and dry, resp e/u.

## 2012-10-24 NOTE — ED Notes (Signed)
The pt is 24 weeks preg and while getting ready for work she twisted her back with pain going down her lt leg.  edc sept 17th

## 2012-10-24 NOTE — ED Provider Notes (Signed)
Medical screening examination/treatment/procedure(s) were performed by non-physician practitioner and as supervising physician I was immediately available for consultation/collaboration. Devoria Albe, MD, [facility]   Ward Givens, MD 10/24/12 2245

## 2012-12-11 ENCOUNTER — Encounter (HOSPITAL_COMMUNITY): Payer: Self-pay | Admitting: *Deleted

## 2012-12-11 ENCOUNTER — Inpatient Hospital Stay (HOSPITAL_COMMUNITY)
Admission: AD | Admit: 2012-12-11 | Discharge: 2012-12-11 | Disposition: A | Discharge status: Home / Self Care | Payer: BC Managed Care – PPO | Source: Ambulatory Visit | Attending: Obstetrics and Gynecology | Admitting: Obstetrics and Gynecology

## 2012-12-11 DIAGNOSIS — O479 False labor, unspecified: Secondary | ICD-10-CM

## 2012-12-11 DIAGNOSIS — E119 Type 2 diabetes mellitus without complications: Secondary | ICD-10-CM | POA: Insufficient documentation

## 2012-12-11 DIAGNOSIS — O4700 False labor before 37 completed weeks of gestation, unspecified trimester: Secondary | ICD-10-CM | POA: Insufficient documentation

## 2012-12-11 DIAGNOSIS — O24919 Unspecified diabetes mellitus in pregnancy, unspecified trimester: Secondary | ICD-10-CM | POA: Insufficient documentation

## 2012-12-11 LAB — URINE MICROSCOPIC-ADD ON

## 2012-12-11 LAB — URINALYSIS, ROUTINE W REFLEX MICROSCOPIC
Hgb urine dipstick: NEGATIVE
Specific Gravity, Urine: 1.015 (ref 1.005–1.030)
Urobilinogen, UA: 0.2 mg/dL (ref 0.0–1.0)
pH: 7 (ref 5.0–8.0)

## 2012-12-11 NOTE — MAU Note (Signed)
uc's for last hour, denies bleeding or LOF.  Pt states uc's are frequent & very painful.
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
## 2013-01-10 ENCOUNTER — Encounter (HOSPITAL_COMMUNITY): Payer: Self-pay | Admitting: *Deleted

## 2013-01-10 ENCOUNTER — Inpatient Hospital Stay (HOSPITAL_COMMUNITY)
Admission: AD | Admit: 2013-01-10 | Discharge: 2013-01-11 | Disposition: A | Discharge status: Home / Self Care | Payer: Medicaid Other | Source: Ambulatory Visit | Attending: Obstetrics and Gynecology | Admitting: Obstetrics and Gynecology

## 2013-01-10 DIAGNOSIS — O99891 Other specified diseases and conditions complicating pregnancy: Secondary | ICD-10-CM | POA: Insufficient documentation

## 2013-01-10 DIAGNOSIS — R030 Elevated blood-pressure reading, without diagnosis of hypertension: Secondary | ICD-10-CM | POA: Insufficient documentation

## 2013-01-10 DIAGNOSIS — O4703 False labor before 37 completed weeks of gestation, third trimester: Secondary | ICD-10-CM

## 2013-01-10 DIAGNOSIS — R51 Headache: Secondary | ICD-10-CM | POA: Insufficient documentation

## 2013-01-10 LAB — URINALYSIS, ROUTINE W REFLEX MICROSCOPIC
Bilirubin Urine: NEGATIVE
Hgb urine dipstick: NEGATIVE
Nitrite: NEGATIVE
Protein, ur: NEGATIVE mg/dL
Urobilinogen, UA: 1 mg/dL (ref 0.0–1.0)

## 2013-01-10 LAB — URINE MICROSCOPIC-ADD ON

## 2013-01-10 MED ORDER — TERBUTALINE SULFATE 1 MG/ML IJ SOLN
0.2500 mg | Freq: Once | INTRAMUSCULAR | Status: AC
  Administered 2013-01-10: 0.25 mg via SUBCUTANEOUS
  Filled 2013-01-10: qty 1
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
## 2013-01-11 ENCOUNTER — Encounter (HOSPITAL_COMMUNITY): Payer: Self-pay | Admitting: *Deleted

## 2013-01-11 ENCOUNTER — Inpatient Hospital Stay (HOSPITAL_COMMUNITY)
Admission: AD | Admit: 2013-01-11 | Discharge: 2013-01-11 | Disposition: A | Discharge status: Home / Self Care | Payer: Medicaid Other | Source: Ambulatory Visit | Attending: Obstetrics and Gynecology | Admitting: Obstetrics and Gynecology

## 2013-01-11 DIAGNOSIS — O479 False labor, unspecified: Secondary | ICD-10-CM

## 2013-01-11 DIAGNOSIS — IMO0001 Reserved for inherently not codable concepts without codable children: Secondary | ICD-10-CM

## 2013-01-11 LAB — CBC WITH DIFFERENTIAL/PLATELET
Basophils Relative: 0 % (ref 0–1)
Eosinophils Absolute: 0.1 10*3/uL (ref 0.0–0.7)
HCT: 32.7 % — ABNORMAL LOW (ref 36.0–46.0)
Hemoglobin: 11.2 g/dL — ABNORMAL LOW (ref 12.0–15.0)
MCH: 26.3 pg (ref 26.0–34.0)
MCHC: 34.3 g/dL (ref 30.0–36.0)
Monocytes Absolute: 0.7 10*3/uL (ref 0.1–1.0)
Monocytes Relative: 12 % (ref 3–12)
RDW: 14.7 % (ref 11.5–15.5)

## 2013-01-11 LAB — LACTATE DEHYDROGENASE: LDH: 141 U/L (ref 94–250)

## 2013-01-11 LAB — URIC ACID: Uric Acid, Serum: 4.7 mg/dL (ref 2.4–7.0)

## 2013-01-11 LAB — COMPREHENSIVE METABOLIC PANEL
Albumin: 2.5 g/dL — ABNORMAL LOW (ref 3.5–5.2)
BUN: 6 mg/dL (ref 6–23)
Calcium: 9.4 mg/dL (ref 8.4–10.5)
Creatinine, Ser: 0.54 mg/dL (ref 0.50–1.10)
Total Protein: 6.4 g/dL (ref 6.0–8.3)

## 2013-01-11 MED ORDER — BUTALBITAL-APAP-CAFFEINE 50-325-40 MG PO TABS
2.0000 | ORAL_TABLET | ORAL | Status: AC
  Administered 2013-01-11: 2 via ORAL
  Filled 2013-01-11: qty 2

## 2013-01-11 MED ORDER — ONDANSETRON HCL 4 MG PO TABS
4.0000 mg | ORAL_TABLET | ORAL | Status: AC
  Administered 2013-01-11: 4 mg via ORAL
  Filled 2013-01-11 (×2): qty 1

## 2013-01-11 NOTE — MAU Note (Signed)
Patient states she was sent to MAU from the office for evaluation of elevated blood pressure. Patient is scheduled for a repeat cesarean section on 9-15. Patient states she has been having increasing swelling and headaches that started yesterday. Was seen during the night for labor evaluation and sent home. Reports good fetal movement, having irregular contractions, no bleeding or leaking.
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
## 2013-01-19 ENCOUNTER — Inpatient Hospital Stay (HOSPITAL_COMMUNITY)
Admission: AD | Admit: 2013-01-19 | Discharge: 2013-01-19 | Disposition: A | Discharge status: Home / Self Care | Payer: Medicaid Other | Source: Ambulatory Visit | Attending: Obstetrics and Gynecology | Admitting: Obstetrics and Gynecology

## 2013-01-19 ENCOUNTER — Encounter (HOSPITAL_COMMUNITY): Payer: Self-pay | Admitting: *Deleted

## 2013-01-19 DIAGNOSIS — O139 Gestational [pregnancy-induced] hypertension without significant proteinuria, unspecified trimester: Secondary | ICD-10-CM

## 2013-01-19 DIAGNOSIS — O4700 False labor before 37 completed weeks of gestation, unspecified trimester: Secondary | ICD-10-CM | POA: Insufficient documentation

## 2013-01-19 DIAGNOSIS — O163 Unspecified maternal hypertension, third trimester: Secondary | ICD-10-CM

## 2013-01-19 LAB — PROTEIN / CREATININE RATIO, URINE
Creatinine, Urine: 146.3 mg/dL
Protein Creatinine Ratio: 0.4 — ABNORMAL HIGH (ref 0.00–0.15)
Total Protein, Urine: 58.2 mg/dL

## 2013-01-19 LAB — COMPREHENSIVE METABOLIC PANEL
ALT: 10 U/L (ref 0–35)
Alkaline Phosphatase: 120 U/L — ABNORMAL HIGH (ref 39–117)
CO2: 17 mEq/L — ABNORMAL LOW (ref 19–32)
GFR calc Af Amer: >90 mL/min (ref >90)
Glucose, Bld: 135 mg/dL — ABNORMAL HIGH (ref 70–99)
Potassium: 4.1 mEq/L (ref 3.5–5.1)
Sodium: 132 mEq/L — ABNORMAL LOW (ref 135–145)
Total Protein: 5.8 g/dL — ABNORMAL LOW (ref 6.0–8.3)

## 2013-01-19 LAB — CBC
Hemoglobin: 11.1 g/dL — ABNORMAL LOW (ref 12.0–15.0)
MCHC: 34 g/dL (ref 30.0–36.0)
RBC: 4.21 MIL/uL (ref 3.87–5.11)
WBC: 6.7 10*3/uL (ref 4.0–10.5)

## 2013-01-19 NOTE — MAU Note (Signed)
M. Williams CNM at the bedside. 

## 2013-01-19 NOTE — MAU Note (Signed)
Pt G3 P1 at 36.3wks, prev C/S, undecided whether she would like to have a repeat C/S or vaginal delivery, having conrtactions every 3-53min since 2315.
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
## 2013-01-25 ENCOUNTER — Inpatient Hospital Stay (HOSPITAL_COMMUNITY)
Admission: AD | Admit: 2013-01-25 | Discharge: 2013-01-25 | Disposition: A | Discharge status: Home / Self Care | Payer: Medicaid Other | Source: Ambulatory Visit | Attending: Obstetrics and Gynecology | Admitting: Obstetrics and Gynecology

## 2013-01-25 DIAGNOSIS — O409XX0 Polyhydramnios, unspecified trimester, not applicable or unspecified: Secondary | ICD-10-CM | POA: Insufficient documentation

## 2013-01-25 DIAGNOSIS — O99810 Abnormal glucose complicating pregnancy: Secondary | ICD-10-CM | POA: Insufficient documentation

## 2013-01-25 NOTE — Progress Notes (Signed)
Reactive NST 

## 2013-01-25 NOTE — MAU Note (Signed)
Pt states here for NST only, has biweekly NST's for DM, per pt has polyhydramnios.

## 2013-01-26 ENCOUNTER — Inpatient Hospital Stay (HOSPITAL_COMMUNITY)
Admission: AD | Admit: 2013-01-26 | Discharge: 2013-01-30 | DRG: 765 | Disposition: A | Discharge status: Home / Self Care | Payer: Medicaid Other | Source: Ambulatory Visit | Attending: Obstetrics and Gynecology | Admitting: Obstetrics and Gynecology

## 2013-01-26 ENCOUNTER — Encounter (HOSPITAL_COMMUNITY): Payer: Self-pay | Admitting: Anesthesiology

## 2013-01-26 ENCOUNTER — Inpatient Hospital Stay (HOSPITAL_COMMUNITY): Payer: Medicaid Other | Admitting: Anesthesiology

## 2013-01-26 ENCOUNTER — Encounter (HOSPITAL_COMMUNITY): Payer: Self-pay | Admitting: *Deleted

## 2013-01-26 ENCOUNTER — Encounter (HOSPITAL_COMMUNITY)
Admission: AD | Disposition: A | Discharge status: Home / Self Care | Payer: Self-pay | Source: Ambulatory Visit | Attending: Obstetrics and Gynecology

## 2013-01-26 DIAGNOSIS — Z98891 History of uterine scar from previous surgery: Secondary | ICD-10-CM

## 2013-01-26 DIAGNOSIS — O1002 Pre-existing essential hypertension complicating childbirth: Secondary | ICD-10-CM | POA: Diagnosis present

## 2013-01-26 DIAGNOSIS — O2432 Unspecified pre-existing diabetes mellitus in childbirth: Secondary | ICD-10-CM | POA: Diagnosis present

## 2013-01-26 DIAGNOSIS — D279 Benign neoplasm of unspecified ovary: Secondary | ICD-10-CM | POA: Diagnosis present

## 2013-01-26 DIAGNOSIS — O34219 Maternal care for unspecified type scar from previous cesarean delivery: Principal | ICD-10-CM | POA: Diagnosis present

## 2013-01-26 DIAGNOSIS — O1493 Unspecified pre-eclampsia, third trimester: Secondary | ICD-10-CM

## 2013-01-26 DIAGNOSIS — E119 Type 2 diabetes mellitus without complications: Secondary | ICD-10-CM | POA: Diagnosis present

## 2013-01-26 DIAGNOSIS — O34599 Maternal care for other abnormalities of gravid uterus, unspecified trimester: Secondary | ICD-10-CM | POA: Diagnosis present

## 2013-01-26 DIAGNOSIS — O1490 Unspecified pre-eclampsia, unspecified trimester: Secondary | ICD-10-CM | POA: Diagnosis present

## 2013-01-26 DIAGNOSIS — O409XX0 Polyhydramnios, unspecified trimester, not applicable or unspecified: Secondary | ICD-10-CM | POA: Diagnosis present

## 2013-01-26 DIAGNOSIS — Z22330 Carrier of Group B streptococcus: Secondary | ICD-10-CM

## 2013-01-26 DIAGNOSIS — O99892 Other specified diseases and conditions complicating childbirth: Secondary | ICD-10-CM | POA: Diagnosis present

## 2013-01-26 HISTORY — PX: OVARIAN CYST REMOVAL: SHX89

## 2013-01-26 LAB — COMPREHENSIVE METABOLIC PANEL
Alkaline Phosphatase: 117 U/L (ref 39–117)
BUN: 7 mg/dL (ref 6–23)
CO2: 19 mEq/L (ref 19–32)
Chloride: 103 mEq/L (ref 96–112)
Creatinine, Ser: 0.53 mg/dL (ref 0.50–1.10)
GFR calc non Af Amer: >90 mL/min (ref >90)
Potassium: 3.9 mEq/L (ref 3.5–5.1)
Total Bilirubin: 0.3 mg/dL (ref 0.3–1.2)

## 2013-01-26 LAB — CBC
HCT: 31.9 % — ABNORMAL LOW (ref 36.0–46.0)
Hemoglobin: 10.7 g/dL — ABNORMAL LOW (ref 12.0–15.0)
Hemoglobin: 10.2 g/dL — ABNORMAL LOW (ref 12.0–15.0)
MCH: 26.5 pg (ref 26.0–34.0)
MCV: 76.3 fL — ABNORMAL LOW (ref 78.0–100.0)
Platelets: 236 10*3/uL (ref 150–400)
RBC: 4.18 MIL/uL (ref 3.87–5.11)
RBC: 3.85 MIL/uL — ABNORMAL LOW (ref 3.87–5.11)
WBC: 5.8 10*3/uL (ref 4.0–10.5)
WBC: 8.2 10*3/uL (ref 4.0–10.5)

## 2013-01-26 LAB — URIC ACID: Uric Acid, Serum: 5.9 mg/dL (ref 2.4–7.0)

## 2013-01-26 LAB — URINALYSIS, ROUTINE W REFLEX MICROSCOPIC
Glucose, UA: NEGATIVE mg/dL
Specific Gravity, Urine: >1.03 — ABNORMAL HIGH (ref 1.005–1.030)
pH: 6 (ref 5.0–8.0)

## 2013-01-26 LAB — PREPARE RBC (CROSSMATCH)

## 2013-01-26 LAB — LACTATE DEHYDROGENASE: LDH: 158 U/L (ref 94–250)

## 2013-01-26 SURGERY — Surgical Case
Anesthesia: Epidural | Site: Abdomen | Wound class: Clean Contaminated

## 2013-01-26 MED ORDER — WITCH HAZEL-GLYCERIN EX PADS: 1.0000 "application " | MEDICATED_PAD | CUTANEOUS | Status: DC | PRN

## 2013-01-26 MED ORDER — ONDANSETRON HCL 4 MG PO TABS
4.0000 mg | ORAL_TABLET | ORAL | Status: DC | PRN
Start: 2013-01-26 — End: 2013-01-30

## 2013-01-26 MED ORDER — MEPERIDINE HCL 25 MG/ML IJ SOLN
INTRAMUSCULAR | Status: DC | PRN
  Administered 2013-01-26: 25 mg via INTRAVENOUS

## 2013-01-26 MED ORDER — FENTANYL CITRATE 0.05 MG/ML IJ SOLN
INTRAMUSCULAR | Status: AC
  Filled 2013-01-26: qty 2

## 2013-01-26 MED ORDER — ALBUTEROL SULFATE HFA 108 (90 BASE) MCG/ACT IN AERS: 1.0000 | INHALATION_SPRAY | RESPIRATORY_TRACT | Status: DC | PRN

## 2013-01-26 MED ORDER — TETANUS-DIPHTH-ACELL PERTUSSIS 5-2.5-18.5 LF-MCG/0.5 IM SUSP
0.5000 mL | Freq: Once | INTRAMUSCULAR | Status: DC
  Filled 2013-01-26: qty 0.5

## 2013-01-26 MED ORDER — MENTHOL 3 MG MT LOZG: 1.0000 | LOZENGE | OROMUCOSAL | Status: DC | PRN

## 2013-01-26 MED ORDER — ONDANSETRON HCL 4 MG/2ML IJ SOLN
4.0000 mg | INTRAMUSCULAR | Status: DC | PRN
  Administered 2013-01-26: 4 mg via INTRAVENOUS
  Filled 2013-01-26: qty 2

## 2013-01-26 MED ORDER — DIBUCAINE 1 % RE OINT: 1.0000 "application " | TOPICAL_OINTMENT | RECTAL | Status: DC | PRN

## 2013-01-26 MED ORDER — SIMETHICONE 80 MG PO CHEW: 80.0000 mg | CHEWABLE_TABLET | ORAL | Status: DC | PRN

## 2013-01-26 MED ORDER — FENTANYL CITRATE 0.05 MG/ML IJ SOLN
25.0000 ug | INTRAMUSCULAR | Status: DC | PRN
  Administered 2013-01-26 (×4): 50 ug via INTRAVENOUS

## 2013-01-26 MED ORDER — DEXTROSE 5 % IV SOLN
3.0000 g | INTRAVENOUS | Status: DC | PRN
  Administered 2013-01-26: 3 g via INTRAVENOUS

## 2013-01-26 MED ORDER — LANOLIN HYDROUS EX OINT: 1.0000 "application " | TOPICAL_OINTMENT | CUTANEOUS | Status: DC | PRN

## 2013-01-26 MED ORDER — SODIUM BICARBONATE 8.4 % IV SOLN
INTRAVENOUS | Status: AC
  Filled 2013-01-26: qty 100

## 2013-01-26 MED ORDER — OXYCODONE-ACETAMINOPHEN 5-325 MG PO TABS
1.0000 | ORAL_TABLET | ORAL | Status: DC | PRN
  Administered 2013-01-26 – 2013-01-27 (×2): 2 via ORAL
  Administered 2013-01-27: 1 via ORAL
  Administered 2013-01-27 – 2013-01-30 (×11): 2 via ORAL
  Filled 2013-01-26 (×6): qty 2
  Filled 2013-01-26: qty 1
  Filled 2013-01-26 (×7): qty 2

## 2013-01-26 MED ORDER — SENNOSIDES-DOCUSATE SODIUM 8.6-50 MG PO TABS
2.0000 | ORAL_TABLET | Freq: Every day | ORAL | Status: DC
  Administered 2013-01-27 – 2013-01-29 (×4): 2 via ORAL

## 2013-01-26 MED ORDER — IBUPROFEN 600 MG PO TABS
600.0000 mg | ORAL_TABLET | Freq: Four times a day (QID) | ORAL | Status: DC
  Administered 2013-01-26 – 2013-01-30 (×16): 600 mg via ORAL
  Filled 2013-01-26 (×16): qty 1

## 2013-01-26 MED ORDER — OXYTOCIN 10 UNIT/ML IJ SOLN
40.0000 [IU] | INTRAVENOUS | Status: DC | PRN
  Administered 2013-01-26: 40 [IU] via INTRAVENOUS

## 2013-01-26 MED ORDER — LIDOCAINE-EPINEPHRINE (PF) 2 %-1:200000 IJ SOLN
INTRAMUSCULAR | Status: AC
  Filled 2013-01-26: qty 40

## 2013-01-26 MED ORDER — METOCLOPRAMIDE HCL 5 MG/ML IJ SOLN
INTRAMUSCULAR | Status: AC
  Filled 2013-01-26: qty 2

## 2013-01-26 MED ORDER — MEPERIDINE HCL 25 MG/ML IJ SOLN
INTRAMUSCULAR | Status: AC
  Filled 2013-01-26: qty 1

## 2013-01-26 MED ORDER — ONDANSETRON HCL 4 MG/2ML IJ SOLN
INTRAMUSCULAR | Status: DC | PRN
  Administered 2013-01-26: 4 mg via INTRAVENOUS

## 2013-01-26 MED ORDER — OXYTOCIN 10 UNIT/ML IJ SOLN
INTRAMUSCULAR | Status: AC
  Filled 2013-01-26: qty 4

## 2013-01-26 MED ORDER — OXYTOCIN 40 UNITS IN LACTATED RINGERS INFUSION - SIMPLE MED: 62.5000 mL/h | INTRAVENOUS | Status: AC

## 2013-01-26 MED ORDER — MAGNESIUM HYDROXIDE 400 MG/5ML PO SUSP
30.0000 mL | ORAL | Status: DC | PRN
  Administered 2013-01-30: 30 mL via ORAL
  Filled 2013-01-26: qty 30

## 2013-01-26 MED ORDER — CITRIC ACID-SODIUM CITRATE 334-500 MG/5ML PO SOLN: 30.0000 mL | Freq: Once | ORAL | Status: DC

## 2013-01-26 MED ORDER — ZOLPIDEM TARTRATE 5 MG PO TABS: 5.0000 mg | ORAL_TABLET | Freq: Every evening | ORAL | Status: DC | PRN

## 2013-01-26 MED ORDER — PRENATAL MULTIVITAMIN CH
1.0000 | ORAL_TABLET | Freq: Every day | ORAL | Status: DC
  Administered 2013-01-27 – 2013-01-30 (×4): 1 via ORAL
  Filled 2013-01-26 (×4): qty 1

## 2013-01-26 MED ORDER — SIMETHICONE 80 MG PO CHEW
80.0000 mg | CHEWABLE_TABLET | Freq: Three times a day (TID) | ORAL | Status: DC
  Administered 2013-01-27 – 2013-01-30 (×15): 80 mg via ORAL

## 2013-01-26 MED ORDER — SODIUM BICARBONATE 8.4 % IV SOLN
INTRAVENOUS | Status: DC | PRN
  Administered 2013-01-26 (×2): 5 mL via EPIDURAL
  Administered 2013-01-26: 3 mL via EPIDURAL
  Administered 2013-01-26: 6 mL via EPIDURAL
  Administered 2013-01-26 (×2): 3 mL via EPIDURAL

## 2013-01-26 MED ORDER — MEASLES, MUMPS & RUBELLA VAC ~~LOC~~ INJ: 0.5000 mL | INJECTION | Freq: Once | SUBCUTANEOUS | Status: DC

## 2013-01-26 MED ORDER — LACTATED RINGERS IV SOLN
INTRAVENOUS | Status: DC
  Administered 2013-01-26: 23:00:00 via INTRAVENOUS

## 2013-01-26 MED ORDER — FENTANYL CITRATE 0.05 MG/ML IJ SOLN
INTRAMUSCULAR | Status: DC | PRN
  Administered 2013-01-26 (×2): 50 ug via INTRAVENOUS
  Administered 2013-01-26: 100 ug via INTRAVENOUS

## 2013-01-26 MED ORDER — FAMOTIDINE IN NACL 20-0.9 MG/50ML-% IV SOLN: 20.0000 mg | Freq: Once | INTRAVENOUS | Status: DC

## 2013-01-26 MED ORDER — ONDANSETRON HCL 4 MG/2ML IJ SOLN
INTRAMUSCULAR | Status: AC
  Filled 2013-01-26: qty 2

## 2013-01-26 MED ORDER — LACTATED RINGERS IV SOLN
INTRAVENOUS | Status: DC | PRN
  Administered 2013-01-26 (×2): via INTRAVENOUS

## 2013-01-26 MED ORDER — DIPHENHYDRAMINE HCL 25 MG PO CAPS: 25.0000 mg | ORAL_CAPSULE | Freq: Four times a day (QID) | ORAL | Status: DC | PRN

## 2013-01-26 MED ORDER — METOCLOPRAMIDE HCL 5 MG/ML IJ SOLN
INTRAMUSCULAR | Status: DC | PRN
  Administered 2013-01-26: 5 mg via INTRAVENOUS

## 2013-01-26 SURGICAL SUPPLY — 32 items
APL SKNCLS STERI-STRIP NONHPOA (GAUZE/BANDAGES/DRESSINGS) ×2
BENZOIN TINCTURE PRP APPL 2/3 (GAUZE/BANDAGES/DRESSINGS) ×1 IMPLANT
CLAMP CORD UMBIL (MISCELLANEOUS) IMPLANT
CLOTH BEACON ORANGE TIMEOUT ST (SAFETY) ×3 IMPLANT
CONTAINER PREFILL 10% NBF 15ML (MISCELLANEOUS) IMPLANT
DRAPE LG THREE QUARTER DISP (DRAPES) ×3 IMPLANT
DRSG OPSITE POSTOP 4X10 (GAUZE/BANDAGES/DRESSINGS) ×3 IMPLANT
DURAPREP 26ML APPLICATOR (WOUND CARE) ×3 IMPLANT
ELECT REM PT RETURN 9FT ADLT (ELECTROSURGICAL) ×3
ELECTRODE REM PT RTRN 9FT ADLT (ELECTROSURGICAL) ×2 IMPLANT
EXTRACTOR VACUUM KIWI (MISCELLANEOUS) IMPLANT
EXTRACTOR VACUUM M CUP 4 TUBE (SUCTIONS) IMPLANT
GLOVE BIO SURGEON STRL SZ8 (GLOVE) ×3 IMPLANT
GLOVE ORTHO TXT STRL SZ7.5 (GLOVE) ×3 IMPLANT
GOWN STRL REIN XL XLG (GOWN DISPOSABLE) ×6 IMPLANT
KIT ABG SYR 3ML LUER SLIP (SYRINGE) IMPLANT
NDL HYPO 25X5/8 SAFETYGLIDE (NEEDLE) ×2 IMPLANT
NEEDLE HYPO 25X5/8 SAFETYGLIDE (NEEDLE) ×3 IMPLANT
NS IRRIG 1000ML POUR BTL (IV SOLUTION) ×3 IMPLANT
PACK C SECTION WH (CUSTOM PROCEDURE TRAY) ×3 IMPLANT
PAD OB MATERNITY 4.3X12.25 (PERSONAL CARE ITEMS) ×3 IMPLANT
RTRCTR C-SECT PINK 25CM LRG (MISCELLANEOUS) ×3 IMPLANT
STAPLER VISISTAT 35W (STAPLE) IMPLANT
SUT CHROMIC 1 CTX 36 (SUTURE) ×6 IMPLANT
SUT PLAIN 0 NONE (SUTURE) IMPLANT
SUT PLAIN 2 0 XLH (SUTURE) IMPLANT
SUT VIC AB 0 CT1 27 (SUTURE) ×9
SUT VIC AB 0 CT1 27XBRD ANBCTR (SUTURE) ×4 IMPLANT
SUT VIC AB 4-0 KS 27 (SUTURE) IMPLANT
TOWEL OR 17X24 6PK STRL BLUE (TOWEL DISPOSABLE) ×3 IMPLANT
TRAY FOLEY CATH 14FR (SET/KITS/TRAYS/PACK) ×3 IMPLANT
WATER STERILE IRR 1000ML POUR (IV SOLUTION) ×3 IMPLANT

## 2013-01-26 NOTE — MAU Note (Signed)
Sent from OB's office to prep for C-section; diagnosed with pre-eclampsia;

## 2013-01-26 NOTE — Transfer of Care (Signed)
Immediate Anesthesia Transfer of Care Note  Patient: Victoria [name]  Procedure(s) Performed: Procedure(s): CESAREAN SECTION repeat (N/A) OVARIAN CYSTECTOMY (Left)  Patient Location: PACU  Anesthesia Type:Epidural  Level of Consciousness: awake, alert  and oriented  Airway & Oxygen Therapy: Patient Spontanous Breathing  Post-op Assessment: Report given to PACU RN and Post -op Vital signs reviewed and stable  Post vital signs: Reviewed and stable  Complications: No apparent anesthesia complications
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
## 2013-01-26 NOTE — Anesthesia Procedure Notes (Addendum)
Epidural Patient location during procedure: OB  Preanesthetic Checklist Completed: patient identified, site marked, surgical consent, pre-op evaluation, timeout performed, IV checked, risks and benefits discussed and monitors and equipment checked  Epidural Patient position: sitting Prep: site prepped and draped and DuraPrep Patient monitoring: continuous pulse ox and blood pressure Approach: midline Injection technique: LOR air  Needle:  Needle type: Tuohy  Needle gauge: 17 G Needle length: 9 cm and 9 Needle insertion depth: 12 cm Catheter type: closed end flexible Catheter size: 19 Gauge Catheter at skin depth: 19 cm Test dose: negative  Assessment Events: blood not aspirated, injection not painful, no injection resistance, negative IV test and no paresthesia  Additional Notes Catheter (-) Asp Heme/CSF Attempted CSE, no CSF and Challenging technically due to tenting of skin with tuohy.

## 2013-01-26 NOTE — Anesthesia Preprocedure Evaluation (Addendum)
Anesthesia Evaluation  Patient identified by MRN, date of birth, ID band Patient awake    Reviewed: Allergy & Precautions, H&P , Patient's Chart, lab work & pertinent test results  History of Anesthesia Complications (+) DIFFICULT AIRWAY  Airway Mallampati: IV TM Distance: >3 FB Neck ROM: full    Dental no notable dental hx.    Pulmonary asthma , sleep apnea ,  breath sounds clear to auscultation  Pulmonary exam normal       Cardiovascular Exercise Tolerance: Good hypertension, Rhythm:regular Rate:Normal     Neuro/Psych    GI/Hepatic   Endo/Other  diabetes, GestationalMorbid obesity  Renal/GU      Musculoskeletal   Abdominal   Peds  Hematology   Anesthesia Other Findings Difficult airway by exam/ not Hx Diabetes mellitus  type 2 Asthma        Obesity         Hypertension    Reproductive/Obstetrics                          Anesthesia Physical Anesthesia Plan  ASA: III and emergent  Anesthesia Plan: Spinal, Combined Spinal and Epidural and Epidural   Post-op Pain Management:    Induction:   Airway Management Planned:   Additional Equipment:   Intra-op Plan:   Post-operative Plan:   Informed Consent: I have reviewed the patients History and Physical, chart, labs and discussed the procedure including the risks, benefits and alternatives for the proposed anesthesia with the patient or authorized representative who has indicated his/her understanding and acceptance.   Dental Advisory Given  Plan Discussed with: CRNA  Anesthesia Plan Comments: (Lab work confirmed with CRNA in room. Platelets okay. Discussed spinal anesthetic, and patient consents to the procedure:  included risk of possible headache,backache, failed block, allergic reaction, and nerve injury. This patient was asked if she had any questions or concerns before the procedure started. )        Anesthesia  Quick Evaluation

## 2013-01-26 NOTE — Op Note (Signed)
Preoperative diagnosis: Intrauterine pregnancy at 37+ weeks, preeclampsia, Type II diabetes, previous cesarean section, polyhydramnios Postoperative diagnosis: Same, possible dermoid left ovary Procedure: Repeat low transverse cesarean section without extensions, left ovarian cystectomy Surgeon: Lavina Hamman M.D. Anesthesia: Epidural Findings: Patient had normal gravid anatomy and delivered a viable female infant with Apgars of 8 and 8 weight pending, there was a 2 cm solid cyst left ovary Estimated blood loss: 800 cc Specimens: Placenta sent to labor and delivery Complications: None  Procedure in detail: The patient was taken to the operating room and placed in the sitting position. Dr. Cristela Blue instilled epidural anesthesia.  She was then placed in the dorsosupine position with left tilt. Abdomen was then taped up, prepped and draped in the usual sterile fashion, and a foley catheter was inserted. The level of her anesthesia was found to be adequate. Abdomen was entered via a standard Pfannenstiel incision through her previous scar beneath her pannus. Once the peritoneal cavity was entered the Alexis disposable self-retaining retractor was placed and good visualization was achieved. A 4 cm transverse incision was then made in the lower uterine segment pushing the bladder inferior. Once the uterine cavity was entered the incision was extended digitally. The fetal vertex was brought to the incision, and a Kiwi vacuum was applied to help with traction.  The vertex was delivered through the incision atraumatically and the vacuum was removed. Mouth and nares were suctioned. The remainder of the infant then delivered atraumatically. Cord was doubly clamped and cut and the infant handed to the awaiting pediatric team. Cord blood was obtained. The placenta delivered spontaneously. Uterus was wiped dry with clean lap pad and all clots and debris were removed. Uterine incision was inspected and found to be  free of extensions. Uterine incision was closed in 1 layer with running locking #1 Chromic. Tubes and ovaries were inspected.  On ultrasound during her pregnancy, there was suspicion of a left dermoid.  There was a 2 cm solid cyst of the left ovary.  Bovie was used to make an incision over this cyst, and the cyst was removed sharply and bluntly.  The edges of the ovarian incision were made hemostatic with the Bovie.  Uterine incision was inspected and found to be hemostatic. Bleeding from serosal edges was controlled with electrocautery. The Alexis retractor was removed. Subfascial space was irrigated and made hemostatic with electrocautery. Peritoneum was closed with interrupted sutures of #1 Chromic and 0 Vicryl to reduce her omentum and bowel.  Fascia was closed in running fashion starting at both ends and meeting in the middle with 0 Vicryl. Subcutaneous tissue was then irrigated and made hemostatic with electrocautery, then closed with running 2-0 plain gut. Skin was closed with staples followed by a sterile dressing. Patient tolerated the procedure well and was taken to the recovery in stable condition. Counts were correct x2, she received Ancef 3 g IV at the beginning of the procedure and she had PAS hose on throughout the procedure.

## 2013-01-26 NOTE — Anesthesia Postprocedure Evaluation (Signed)
  Anesthesia Post-op Note  Anesthesia Post Note  Patient: Victoria [name]  Procedure(s) Performed: Procedure(s) (LRB): CESAREAN SECTION repeat (N/A) OVARIAN CYSTECTOMY (Left)  Anesthesia type: Epidural  Patient location: PACU  Post pain: Pain level controlled  Post assessment: Post-op Vital signs reviewed  Last Vitals:  Filed Vitals:   01/26/13 2200  BP: 154/87  Pulse: 93  Temp:   Resp: 12    Post vital signs: stable  Level of consciousness: awake  Complications: No apparent anesthesia complications

## 2013-01-27 ENCOUNTER — Encounter (HOSPITAL_COMMUNITY): Payer: Self-pay | Admitting: Obstetrics and Gynecology

## 2013-01-27 LAB — URINE CULTURE: Culture: NO GROWTH

## 2013-01-27 LAB — CBC
HCT: 25.2 % — ABNORMAL LOW (ref 36.0–46.0)
RBC: 3.3 MIL/uL — ABNORMAL LOW (ref 3.87–5.11)
RDW: 14.9 % (ref 11.5–15.5)
WBC: 7.9 10*3/uL (ref 4.0–10.5)

## 2013-01-27 LAB — GLUCOSE, CAPILLARY
Glucose-Capillary: 168 mg/dL — ABNORMAL HIGH (ref 70–99)
Glucose-Capillary: 164 mg/dL — ABNORMAL HIGH (ref 70–99)
Glucose-Capillary: 204 mg/dL — ABNORMAL HIGH (ref 70–99)

## 2013-01-27 NOTE — Lactation Note (Signed)
This note was copied from the chart of Victoria Christmas Faraci. Lactation Consultation Note   Initial consult with this mom of a NICU baby, now 44 hours old. The baby is term, LGA. He was with mom initially, but at about 8 hours of age, needed to go to the NICU for low ot's. Mom began pumping today, and this evening I showed her how to hand express. She collected a few drops of colostrum to bring to the baby. Teaching sone from the NICU book on providing EBM .  I will assist mom with latching her baby in hte NICU, tomorrow.  Patient Name: Victoria [name] [id number] Date: 01/27/2013 Reason for consult: Initial assessment;NICU baby;Other (Comment) (LGA)   Maternal Data Formula Feeding for Exclusion: Yes (baby in NICU) Has patient been taught Hand Expression?: Yes Does the patient have breastfeeding experience prior to this delivery?: Yes  Feeding    LATCH Score/Interventions                      Lactation Tools Discussed/Used Tools: Pump Breast pump type: Double-Electric Breast Pump WIC Program: Yes (mom knows to call for DEP) Pump Review: Setup, frequency, and cleaning;Milk Storage;Other (comment) (hand expression, DEP teaching) Initiated by:: bedside RN - not within 6 hours of baby going to NICU Date initiated:: 01/27/13   Consult Status Consult Status: Follow-up Date: 01/28/13 Follow-up type: In-patient    Alfred Levins 01/27/2013, 6:21 PM

## 2013-01-27 NOTE — Progress Notes (Signed)
Subjective: Postpartum Day #1: Cesarean Delivery Patient reports incisional pain and tolerating PO.    Objective: Vital signs in last 24 hours: Temp:  [97.7 F (36.5 C)-98.7 F (37.1 C)] 98.1 F (36.7 C) (09/03 0442) Pulse Rate:  [82-105] 102 (09/03 0442) Resp:  [10-32] 32 (09/03 0442) BP: (119-157)/(70-118) 134/90 mmHg (09/03 0442) SpO2:  [92 %-100 %] 99 % (09/03 0442) Weight:  [146.33 kg (322 lb 9.6 oz)] 146.33 kg (322 lb 9.6 oz) (09/02 2303)  Physical Exam:  General: alert Lochia: appropriate Uterine Fundus: firm   Recent Labs  01/26/13 2123 01/27/13 0525  HGB 10.2* 8.7*  HCT 29.9* 25.2*    Assessment/Plan: Status post Cesarean section. Doing well postoperatively. BP is stable, highest sugar is 168. Continue current care.  Will transfer to floor, continue current orders.  Encouraged to ambulate.  Debborah Alonge D 01/27/2013, 7:54 AM

## 2013-01-27 NOTE — Anesthesia Postprocedure Evaluation (Signed)
  Anesthesia Post-op Note  Patient: Victoria [name]  Procedure(s) Performed: Procedure(s): CESAREAN SECTION repeat (N/A) OVARIAN CYSTECTOMY (Left)  Patient Location: PACU and A-ICU  Anesthesia Type:Epidural  Level of Consciousness: awake, alert  and oriented  Airway and Oxygen Therapy: Patient Spontanous Breathing  Post-op Pain: mild  Post-op Assessment: Patient's Cardiovascular Status Stable, Respiratory Function Stable, No signs of Nausea or vomiting, Adequate PO intake, Pain level controlled, No headache, No backache, No residual numbness and No residual motor weakness  Post-op Vital Signs: stable  Complications: No apparent anesthesia complications

## 2013-01-27 NOTE — Progress Notes (Signed)
UR chart review completed.  

## 2013-01-28 LAB — GLUCOSE, CAPILLARY
Glucose-Capillary: 114 mg/dL — ABNORMAL HIGH (ref 70–99)
Glucose-Capillary: 129 mg/dL — ABNORMAL HIGH (ref 70–99)

## 2013-01-28 MED ORDER — METFORMIN HCL 500 MG PO TABS
500.0000 mg | ORAL_TABLET | Freq: Every day | ORAL | Status: DC
  Administered 2013-01-28 – 2013-01-30 (×3): 500 mg via ORAL
  Filled 2013-01-28 (×4): qty 1

## 2013-01-28 MED ORDER — GLYBURIDE 5 MG PO TABS: 5.0000 mg | ORAL_TABLET | Freq: Every day | ORAL | Status: DC

## 2013-01-28 MED ORDER — METFORMIN HCL 500 MG PO TABS: 500.0000 mg | ORAL_TABLET | Freq: Every day | ORAL | Status: DC

## 2013-01-28 NOTE — Progress Notes (Addendum)
Subjective: Postpartum Day 2: Cesarean Delivery Patient reports incisional pain, tolerating PO and no problems voiding.  Nl lochia, pain controlled  Objective: Vital signs in last 24 hours: Temp:  [98 F (36.7 C)-98.7 F (37.1 C)] 98.1 F (36.7 C) (09/04 0546) Pulse Rate:  [87-103] 88 (09/04 0546) Resp:  [19-20] 20 (09/04 0546) BP: (91-138)/(63-80) 91/63 mmHg (09/04 0546) SpO2:  [98 %-100 %] 100 % (09/04 0546) Weight:  [146.172 kg (322 lb 4 oz)] 146.172 kg (322 lb 4 oz) (09/04 0546)  Physical Exam:  General: alert and no distress Lochia: appropriate Uterine Fundus: firm Incision: healing well DVT Evaluation: No evidence of DVT seen on physical exam.   Recent Labs  01/26/13 2123 01/27/13 0525  HGB 10.2* 8.7*  HCT 29.9* 25.2*    Assessment/Plan: Status post Cesarean section. Doing well postoperatively.  Continue current care.  Encourage following diabetic diet.  Given elevated BS last night after 2nd dinner.  Fasting 114 this AM, will start metformin 500 qd.  BOVARD,Manon Banbury 01/28/2013, 8:13 AM

## 2013-01-28 NOTE — Lactation Note (Signed)
This note was copied from the chart of Victoria Simmone Cape. Lactation Consultation Note   Follow up consult with this mom and baby, in NICU. Mom was able to get baby latched deeply in cradle hold, with strong suckles. I showed mom how to use cross cradle hold, mostly because I was concerned that the baby's nose was buried in mom's large breast. Mom is large and the baby is almost 13 pounds. Holding and positioning him was difficult in the NICU, with mom in a wheelchair. I will continue to follow this family in the NICU.  Patient Name: Victoria [name] [id number] Date: 01/28/2013 Reason for consult: Follow-up assessment;NICU baby   Maternal Data    Feeding Feeding Type: Formula Length of feed: 45 min  LATCH Score/Interventions Latch: Grasps breast easily, tongue down, lips flanged, rhythmical sucking. (breastfed x30 min.)  Audible Swallowing: None  Type of Nipple: Everted at rest and after stimulation  Comfort (Breast/Nipple): Soft / non-tender     Hold (Positioning): Assistance needed to correctly position infant at breast and maintain latch.  LATCH Score: 7  Lactation Tools Discussed/Used     Consult Status Consult Status: Follow-up Date: 01/29/13 Follow-up type: In-patient    Victoria [name] 01/28/2013, 12:39 PM

## 2013-01-28 NOTE — Progress Notes (Signed)
Pt's CBG was 204 at 2250 after eating dinner for a second time... Dr. Ellyn Hack notified... No new orders at this time... Encouraged pt to incorporate more fresh foods into diet, and drink more water to promote incisional healing... Pt verbalized understanding... Will continue to monitor pt.Marland KitchenMarland Kitchen

## 2013-01-28 NOTE — Progress Notes (Signed)
Clinical Social Work Department BRIEF PSYCHOSOCIAL ASSESSMENT 01/28/2013  Patient:  Victoria [name],Victoria [name]     Account Number:  [id number]     Admit date:  01/26/2013  Clinical Social Worker:  Johnmark Geiger, LCSW  Date/Time:  01/28/2013 03:45 PM  Referred by:    Date Referred:    Other Referral:   No referral-NICU admission   Interview type:  Family Other interview type:    PSYCHOSOCIAL DATA Living Status:  FAMILY Admitted from facility:   Level of care:   Primary support name:  Brandon Freilich Primary support relationship to patient:  SPOUSE Degree of support available:   Good support system    CURRENT CONCERNS Current Concerns  None Noted   Other Concerns:    SOCIAL WORK ASSESSMENT / PLAN CSW met with parents and their 5 year old son, Donovan, in MOB's third floor room/311 to introduce myself, offer support and complete assessment due to baby's admission to NICU.  CSW explained ongoing support services offered by NICU CSW and gave contact information.  CSW discussed signs and symptoms of PPD and ensured that MOB will call her doctor if any concerns arise.  CSW has no social concerns at this time.   Assessment/plan status:   Other assessment/ plan:   Information/referral to community resources:   Baby will be referred to [facility]    PATIENT'S/FAMILY'S RESPONSE TO PLAN OF CARE: Parents were very pleasant and appear to be coping well with the situation.  They state they have good supports and assessment was somewhat brief due to the arrival of visitors.  They report having everything they need for baby at home and will be taking him to [facility] on [address].  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  She will be staying home with the baby.  FOB is a Detention Officer, who will have 2 months off.  They state no concerns with transportation if MOB is discharged prior to baby.  The only concern MOB states at this time is that she  thinks baby does much better with breast feeding that with bottle feeding and wonders how breast feeding will work once she is discharged.  CSW explained that we will monitor timing and if there is a possibility that baby may be ready to discharge the day after MOB, we will offer her a room to room in with baby.  If not, there will be a period of time where she will have to come back and forth and CSW encouraged her to plan visits around feeding times as much as possible, but that she will have to bring pumped milk for him for a period of time if this is the case.  CSW asked if she has a breast pump and she does.  CSW encouraged her to discuss this concern with the medical staff.  MOB was very relaxed while discussing this concern and understands that the situation is not ideal, but is necessary.  She states no further questions or needs and thanked CSW for the visit.        

## 2013-01-29 LAB — GLUCOSE, CAPILLARY: Glucose-Capillary: 134 mg/dL — ABNORMAL HIGH (ref 70–99)

## 2013-01-29 NOTE — Progress Notes (Signed)
Subjective: Postpartum Day 3: Cesarean Delivery Patient reports incisional pain, tolerating PO and no problems voiding.  Nl lochia, pain controlled.    Objective: Vital signs in last 24 hours: Temp:  [98.3 F (36.8 C)-98.5 F (36.9 C)] 98.5 F (36.9 C) (09/05 0656) Pulse Rate:  [96-110] 100 (09/05 0656) Resp:  [18-20] 20 (09/05 0656) BP: (124-137)/(59-86) 136/70 mmHg (09/05 0656) SpO2:  [98 %-100 %] 99 % (09/05 0656)  Physical Exam:  General: alert and no distress Lochia: appropriate Uterine Fundus: firm Incision: healing well DVT Evaluation: No evidence of DVT seen on physical exam.   Recent Labs  01/26/13 2123 01/27/13 0525  HGB 10.2* 8.7*  HCT 29.9* 25.2*    Assessment/Plan: Status post Cesarean section. Doing well postoperatively.  Continue current care.  D/C tomorrow.  Baby doing well in NICU.    BOVARD,Lyndy Russman 01/29/2013, 7:47 AM

## 2013-01-30 LAB — TYPE AND SCREEN
ABO/RH(D): B POS
Unit division: 0

## 2013-01-30 LAB — GLUCOSE, CAPILLARY: Glucose-Capillary: 122 mg/dL — ABNORMAL HIGH (ref 70–99)

## 2013-01-30 MED ORDER — PRENATAL MULTIVITAMIN CH: 1.0000 | ORAL_TABLET | Freq: Every day | ORAL | Status: DC

## 2013-01-30 MED ORDER — METFORMIN HCL 500 MG PO TABS: 500.0000 mg | ORAL_TABLET | Freq: Every day | ORAL | Status: DC

## 2013-01-30 MED ORDER — OXYCODONE-ACETAMINOPHEN 5-325 MG PO TABS: 1.0000 | ORAL_TABLET | ORAL | Status: DC | PRN

## 2013-01-30 MED ORDER — IBUPROFEN 800 MG PO TABS: 800.0000 mg | ORAL_TABLET | Freq: Three times a day (TID) | ORAL | Status: DC | PRN

## 2013-01-30 NOTE — Progress Notes (Signed)
Subjective: Postpartum Day 4: Cesarean Delivery Patient reports incisional pain and tolerating PO.  Tired.  Breastfeeding baby in NICU  Objective: Vital signs in last 24 hours: Temp:  [97.9 F (36.6 C)-98.4 F (36.9 C)] 98.4 F (36.9 C) (09/06 0635) Pulse Rate:  [90-97] 97 (09/06 0635) Resp:  [18-20] 20 (09/06 0635) BP: (119-145)/(74-97) 136/92 mmHg (09/06 0635) SpO2:  [97 %-98 %] 98 % (09/06 1610)  Physical Exam:  General: alert and no distress Lochia: appropriate Uterine Fundus: firm Incision: healing well DVT Evaluation: No evidence of DVT seen on physical exam.  No results found for this basename: HGB, HCT,  in the last 72 hours  Assessment/Plan: Status post Cesarean section. Doing well postoperatively.  Discharge home with standard precautions and return to clinic in 1 week for staple removal. D/C with motrin, percocet, and pnv.    BOVARD,Vladimir Lenhoff 01/30/2013, 7:33 AM

## 2013-01-30 NOTE — Progress Notes (Signed)
Discharge instructions provided to patient and sifnificant other at bedside.  Follow up appointments, activity, medications, incision care, when to call the docotor and community resources discussed.  No questions at this time.  Patient in stable condition.  Osvaldo Angst, RN----------------

## 2013-01-30 NOTE — Lactation Note (Signed)
This note was copied from the chart of Victoria [name]. Lactation Consultation Note  I spoke to mom briefly today. She reports breast feeding going well, and she should room in in NICU with her baby tonight. Mom is still pumping in addition to breast feeding. If she and baby go home tomorrow, she may need to loan a DEP. I will follow up with her tomororw, prior to their discharge.  Patient Name: Victoria [name] [id number] Date: 01/30/2013 Reason for consult: Follow-up assessment;NICU baby   Maternal Data    Feeding    LATCH Score/Interventions                      Lactation Tools Discussed/Used     Consult Status Consult Status: Follow-up Date: 01/31/13 Follow-up type: In-patient    Alfred Levins 01/30/2013, 12:42 PM

## 2013-01-30 NOTE — Discharge Summary (Signed)
Obstetric Discharge Summary Reason for Admission: cesarean section Prenatal Procedures: none Intrapartum Procedures: cesarean: low cervical, transverse Postpartum Procedures: none Complications-Operative and Postpartum: elevated glucose, started Metformin, baby in NICU Hemoglobin  Date Value Range Status  01/27/2013 8.7* 12.0 - 15.0 g/dL Final     HCT  Date Value Range Status  01/27/2013 25.2* 36.0 - 46.0 % Final    Physical Exam:  General: alert and no distress Lochia: appropriate Uterine Fundus: firm Incision: healing well DVT Evaluation: No evidence of DVT seen on physical exam.  Discharge Diagnoses: Term Pregnancy-delivered  Discharge Information: Date: 01/30/2013 Activity: pelvic rest Diet: routine Medications: PNV, Ibuprofen, Percocet and Metformin Condition: stable Instructions: refer to practice specific booklet Discharge to: home Follow-up Information   Follow up with Victoria D, MD In 7 days. (staple removal)    Specialty:  Obstetrics and Gynecology   Contact information:   [address], [address] [place] [address] [phone]       Newborn Data: Live born female  Birth Weight: 12 lb 12 oz (5783 g) APGAR: 8, 8  Home with mother.  Victoria [name] 01/30/2013, 7:41 AM

## 2013-02-05 ENCOUNTER — Other Ambulatory Visit (HOSPITAL_COMMUNITY): Payer: BC Managed Care – PPO

## 2013-02-08 ENCOUNTER — Inpatient Hospital Stay (HOSPITAL_COMMUNITY): Admit: 2013-02-08 | Payer: BC Managed Care – PPO | Admitting: Obstetrics and Gynecology

## 2013-02-11 ENCOUNTER — Encounter: Payer: Self-pay | Admitting: Family Medicine

## 2013-02-11 ENCOUNTER — Ambulatory Visit (INDEPENDENT_AMBULATORY_CARE_PROVIDER_SITE_OTHER): Payer: Medicaid Other | Admitting: Family Medicine

## 2013-02-11 VITALS — BP 146/94 | HR 80 | Ht 64.0 in | Wt 290.0 lb

## 2013-02-11 DIAGNOSIS — E119 Type 2 diabetes mellitus without complications: Secondary | ICD-10-CM

## 2013-02-11 DIAGNOSIS — I10 Essential (primary) hypertension: Secondary | ICD-10-CM

## 2013-02-11 DIAGNOSIS — E559 Vitamin D deficiency, unspecified: Secondary | ICD-10-CM

## 2013-02-11 DIAGNOSIS — G4733 Obstructive sleep apnea (adult) (pediatric): Secondary | ICD-10-CM

## 2013-02-11 LAB — HEMOGLOBIN A1C
Hgb A1c MFr Bld: 6.7 % — ABNORMAL HIGH (ref <5.7)
Mean Plasma Glucose: 146 mg/dL — ABNORMAL HIGH (ref <117)
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
## 2013-02-11 NOTE — Patient Instructions (Signed)
Continue current medications.  Hopefully BP will improve on the new medication. Continue to periodically monitor blood pressure and sugar. Re-start using CPAP machine (as you get to sleep longer once baby is in a more regular sleep routine)  I strongly encourage that you re-consider getting a flu shot (underlying risks are your asthma and diabetes; and also because of having newborn who is too young to get their own flu shot).

## 2013-02-12 ENCOUNTER — Telehealth: Payer: Self-pay | Admitting: Family Medicine

## 2013-02-12 LAB — COMPREHENSIVE METABOLIC PANEL
Albumin: 3.7 g/dL (ref 3.5–5.2)
CO2: 28 mEq/L (ref 19–32)
Chloride: 104 mEq/L (ref 96–112)
Glucose, Bld: 94 mg/dL (ref 70–99)
Potassium: 3.6 mEq/L (ref 3.5–5.3)
Sodium: 144 mEq/L (ref 135–145)
Total Protein: 6.5 g/dL (ref 6.0–8.3)

## 2013-02-12 NOTE — Telephone Encounter (Signed)
Spoke to pt and informed that forms for [facility] were faxed to [phone] as per instructions on letter. Pt requested a copy be mailed to her. Addressed was verified and mailed to pt.

## 2013-04-01 ENCOUNTER — Other Ambulatory Visit: Payer: Self-pay

## 2013-04-16 ENCOUNTER — Encounter: Payer: Self-pay | Admitting: Medical

## 2013-04-16 ENCOUNTER — Ambulatory Visit (INDEPENDENT_AMBULATORY_CARE_PROVIDER_SITE_OTHER): Payer: Medicaid Other | Admitting: Medical

## 2013-04-16 VITALS — BP 140/90 | HR 74 | Temp 98.3°F | Resp 18 | Wt 272.0 lb

## 2013-04-16 DIAGNOSIS — G4733 Obstructive sleep apnea (adult) (pediatric): Secondary | ICD-10-CM

## 2013-04-16 DIAGNOSIS — I10 Essential (primary) hypertension: Secondary | ICD-10-CM
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
## 2013-04-21 ENCOUNTER — Encounter (HOSPITAL_COMMUNITY): Payer: Self-pay | Admitting: Emergency Medicine

## 2013-04-21 ENCOUNTER — Emergency Department (HOSPITAL_COMMUNITY)
Admission: EM | Admit: 2013-04-21 | Discharge: 2013-04-21 | Disposition: A | Discharge status: Home / Self Care | Payer: Medicaid Other | Source: Home / Self Care | Attending: Emergency Medicine | Admitting: Emergency Medicine

## 2013-04-21 DIAGNOSIS — K089 Disorder of teeth and supporting structures, unspecified: Secondary | ICD-10-CM

## 2013-04-21 DIAGNOSIS — K0889 Other specified disorders of teeth and supporting structures: Secondary | ICD-10-CM

## 2013-04-21 MED ORDER — AMOXICILLIN 500 MG PO CAPS: 500.0000 mg | ORAL_CAPSULE | Freq: Three times a day (TID) | ORAL | Status: DC

## 2013-04-21 NOTE — ED Provider Notes (Signed)
Medical screening examination/treatment/procedure(s) were performed by non-physician practitioner and as supervising physician I was immediately available for consultation/collaboration.  Leslee Home, M.D.  Reuben Likes, MD 04/21/13 [phone]
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
## 2013-04-21 NOTE — ED Notes (Addendum)
Pt  Has  A  Left  Sided facial  Toothache  With  Swelling  With  The  Symptoms     Since    Yesterday      Pt reports  May have  A  Broken tooth    \ Pt  Sitting  Upright on  exan table   Speaking in  Complete  sentances    -   Has  An appt   Next  Week      With a  Dentist      Pt is  Breast feeding as  Well

## 2013-05-13 ENCOUNTER — Encounter (HOSPITAL_COMMUNITY): Payer: Self-pay | Admitting: Emergency Medicine

## 2013-05-13 ENCOUNTER — Emergency Department (HOSPITAL_COMMUNITY)
Admission: EM | Admit: 2013-05-13 | Discharge: 2013-05-13 | Disposition: A | Discharge status: Home / Self Care | Payer: Medicaid Other | Attending: Emergency Medicine | Admitting: Emergency Medicine

## 2013-05-13 DIAGNOSIS — J45909 Unspecified asthma, uncomplicated: Secondary | ICD-10-CM | POA: Insufficient documentation

## 2013-05-13 DIAGNOSIS — Z87828 Personal history of other (healed) physical injury and trauma: Secondary | ICD-10-CM | POA: Insufficient documentation

## 2013-05-13 DIAGNOSIS — R079 Chest pain, unspecified: Secondary | ICD-10-CM

## 2013-05-13 DIAGNOSIS — R42 Dizziness and giddiness: Secondary | ICD-10-CM | POA: Insufficient documentation

## 2013-05-13 DIAGNOSIS — Z79899 Other long term (current) drug therapy: Secondary | ICD-10-CM | POA: Insufficient documentation

## 2013-05-13 DIAGNOSIS — E669 Obesity, unspecified: Secondary | ICD-10-CM | POA: Insufficient documentation

## 2013-05-13 DIAGNOSIS — E119 Type 2 diabetes mellitus without complications: Secondary | ICD-10-CM | POA: Insufficient documentation

## 2013-05-13 DIAGNOSIS — Z8742 Personal history of other diseases of the female genital tract: Secondary | ICD-10-CM | POA: Insufficient documentation

## 2013-05-13 DIAGNOSIS — Z87440 Personal history of urinary (tract) infections: Secondary | ICD-10-CM | POA: Insufficient documentation

## 2013-05-13 DIAGNOSIS — I10 Essential (primary) hypertension: Secondary | ICD-10-CM | POA: Insufficient documentation

## 2013-05-13 DIAGNOSIS — R0789 Other chest pain: Secondary | ICD-10-CM | POA: Insufficient documentation

## 2013-05-13 LAB — POCT I-STAT TROPONIN I

## 2013-05-13 LAB — BASIC METABOLIC PANEL
BUN: 13 mg/dL (ref 6–23)
Chloride: 100 mEq/L (ref 96–112)
Creatinine, Ser: 0.77 mg/dL (ref 0.50–1.10)
GFR calc Af Amer: >90 mL/min (ref >90)
Glucose, Bld: 103 mg/dL — ABNORMAL HIGH (ref 70–99)

## 2013-05-13 LAB — CBC
HCT: 36.5 % (ref 36.0–46.0)
Hemoglobin: 12.2 g/dL (ref 12.0–15.0)
MCHC: 33.4 g/dL (ref 30.0–36.0)
MCV: 73.1 fL — ABNORMAL LOW (ref 78.0–100.0)
RDW: 14.9 % (ref 11.5–15.5)

## 2013-05-13 NOTE — ED Notes (Signed)
Periods of shooting L chest pains that began early yesterday.  Has happened off and on all day; attributed it to breast feeding but the pain began going to L shoulder and arm.  Dizziness began tonight, pt became concerned and called 911.

## 2013-05-13 NOTE — ED Notes (Signed)
Presents with onset intermittent chest all day described as sharp on left side of chest worse with movement associated with dizziness and hypertension 200/100s. 3 months post partum. Alert and oreinted. MAEx4.  Dizziness worse with movement,.
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
## 2013-07-26 ENCOUNTER — Telehealth: Payer: Self-pay | Admitting: Family Medicine

## 2013-07-26 NOTE — Telephone Encounter (Signed)
Pt dropped off a letter from the [facility]. They are requesting a letter from pcp. I am sending letter back on pt's chart. Please call [phone] when ready.

## 2013-07-27 NOTE — Telephone Encounter (Signed)
See info written on form.  She is due for med check.  She should check BP's elsewhere and bring in list to visit.  She is due for labs (to be done at visit).  Also, they are requesting compliance report from CPAP, and there isn't any in her chart (paper or scanned into computer)  We need to get this info (from DME/cpap provider).  See info written on form (put in red folder in outgoing shelf)

## 2013-07-28 NOTE — Telephone Encounter (Signed)
Spoke with patient and she will be here for her appt on 08/12/13-called her and apparently she has gotten her insurance situation handled. She now has straight medicaid and will be covered for visit. I put her chart on your shelf, not sure if you needed it until her visit.

## 2013-08-12 ENCOUNTER — Encounter: Payer: Self-pay | Admitting: Family Medicine

## 2013-08-12 ENCOUNTER — Ambulatory Visit (INDEPENDENT_AMBULATORY_CARE_PROVIDER_SITE_OTHER): Payer: Medicaid Other | Admitting: Family Medicine

## 2013-08-12 VITALS — BP 170/108 | HR 72 | Ht 64.0 in | Wt 285.0 lb

## 2013-08-12 DIAGNOSIS — E119 Type 2 diabetes mellitus without complications: Secondary | ICD-10-CM

## 2013-08-12 DIAGNOSIS — G4733 Obstructive sleep apnea (adult) (pediatric): Secondary | ICD-10-CM

## 2013-08-12 DIAGNOSIS — I10 Essential (primary) hypertension: Secondary | ICD-10-CM

## 2013-08-12 DIAGNOSIS — J45909 Unspecified asthma, uncomplicated: Secondary | ICD-10-CM

## 2013-08-12 DIAGNOSIS — E559 Vitamin D deficiency, unspecified: Secondary | ICD-10-CM

## 2013-08-12 DIAGNOSIS — R809 Proteinuria, unspecified: Secondary | ICD-10-CM

## 2013-08-12 LAB — LIPID PANEL
Cholesterol: 182 mg/dL (ref 0–200)
HDL: 60 mg/dL (ref >39)
LDL CALC: 109 mg/dL — AB (ref 0–99)
Total CHOL/HDL Ratio: 3 Ratio
Triglycerides: 66 mg/dL (ref <150)
VLDL: 13 mg/dL (ref 0–40)

## 2013-08-12 LAB — POCT GLYCOSYLATED HEMOGLOBIN (HGB A1C): HEMOGLOBIN A1C: 5.6

## 2013-08-12 LAB — BASIC METABOLIC PANEL
BUN: 11 mg/dL (ref 6–23)
CHLORIDE: 104 meq/L (ref 96–112)
CO2: 25 meq/L (ref 19–32)
CREATININE: 0.59 mg/dL (ref 0.50–1.10)
Calcium: 9.1 mg/dL (ref 8.4–10.5)
Glucose, Bld: 84 mg/dL (ref 70–99)
Potassium: 3.8 mEq/L (ref 3.5–5.3)
Sodium: 137 mEq/L (ref 135–145)

## 2013-08-12 MED ORDER — HYDROCHLOROTHIAZIDE 25 MG PO TABS: 25.0000 mg | ORAL_TABLET | Freq: Every day | ORAL | Status: DC

## 2013-08-12 NOTE — Patient Instructions (Addendum)
Discussed needing to get husband on board with helping with baby's schedule (either during day, keeping him awake more, or helping at night so you can stay asleep and wear CPAP)  Reviewed safety in lacation of HCTZ and amlodipine. HCTZ is considered safer, and you have taken this with good response in the past.  Restart HCTZ 25mg  once daily.  If/when BP drops to <110/60 then cut back labetalol to 1/2 tablet twice daily.  Keep list of blood pressures and pulse.  Bring list to f/u visit in 3-4 weeks.  It is recommended that you get at least 30 minutes of aerobic exercise at least 5 days/week (for weight loss, you may need as much as 60-90 minutes). This can be any activity that gets your heart rate up. This can be divided in 10-15 minute intervals if needed, but try and build up your endurance at least once a week.  Weight bearing exercise is also recommended twice weekly.  It is important to remember to take a daily vitamin D supplement longterm.  We will let you know your lab results and if you need a prescription.  We discussed changing to amlodipine for longterm, once you are no longer nursing, as this is a once daily medication.  Low sodium diet.  Sodium-Controlled Diet Sodium is a mineral. It is found in many foods. Sodium may be found naturally or added during the making of a food. The most common form of sodium is salt, which is made up of sodium and chloride. Reducing your sodium intake involves changing your eating habits. The following guidelines will help you reduce the sodium in your diet:  Stop using the salt shaker.  Use salt sparingly in cooking and baking.  Substitute with sodium-free seasonings and spices.  Do not use a salt substitute (potassium chloride) without your caregiver's permission.  Include a variety of fresh, unprocessed foods in your diet.  Limit the use of processed and convenience foods that are high in sodium. USE THE FOLLOWING FOODS  SPARINGLY: Breads/Starches  Commercial bread stuffing, commercial pancake or waffle mixes, coating mixes. Waffles. Croutons. Prepared (boxed or frozen) potato, rice, or noodle mixes that contain salt or sodium. Salted [place] fries or hash browns. Salted popcorn, breads, crackers, chips, or snack foods. Vegetables  Vegetables canned with salt or prepared in cream, butter, or cheese sauces. Sauerkraut. Tomato or vegetable juices canned with salt.  Fresh vegetables are allowed if rinsed thoroughly. Fruit  Fruit is okay to eat. Meat and Meat Substitutes  Salted or smoked meats, such as bacon or Canadian bacon, chipped or corned beef, hot dogs, salt pork, luncheon meats, pastrami, ham, or sausage. Canned or smoked fish, poultry, or meat. Processed cheese or cheese spreads, blue or Roquefort cheese. Battered or frozen fish products. Prepared spaghetti sauce. Baked beans. Reuben sandwiches. Salted nuts. Caviar. Milk  Limit buttermilk to 1 cup per week. Soups and Combination Foods  Bouillon cubes, canned or dried soups, broth, consomm. Convenience (frozen or packaged) dinners with more than 600 mg sodium. Pot pies, pizza, Asian food, fast food cheeseburgers, and specialty sandwiches. Desserts and Sweets  Regular (salted) desserts, pie, commercial fruit snack pies, commercial snack cakes, canned puddings.  Eat desserts and sweets in moderation. Fats and Oils  Gravy mixes or canned gravy. No more than 1 to 2 tbs of salad dressing. Chip dips.  Eat fats and oils in moderation. Beverages  See those listed under the vegetables and milk groups. Condiments  Ketchup, mustard, meat sauces, salsa, regular (  salted) and lite soy sauce or mustard. Dill pickles, olives, meat tenderizer. Prepared horseradish or pickle relish. Dutch-processed cocoa. Baking powder or baking soda used medicinally. Worcestershire sauce. "Light" salt. Salt substitute, unless approved by your caregiver. Document Released:  11/02/2001 Document Revised: 08/05/2011 Document Reviewed: 06/05/2009 [facility] Patient Information 2014 [place], [place].
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
## 2013-08-13 ENCOUNTER — Telehealth: Payer: Self-pay | Admitting: Family Medicine

## 2013-08-13 LAB — MICROALBUMIN / CREATININE URINE RATIO
Creatinine, Urine: 119.5 mg/dL
MICROALB UR: 8.49 mg/dL — AB (ref 0.00–1.89)
MICROALB/CREAT RATIO: 71 mg/g — AB (ref 0.0–30.0)

## 2013-08-13 LAB — VITAMIN D 25 HYDROXY (VIT D DEFICIENCY, FRACTURES): Vit D, 25-Hydroxy: 26 ng/mL — ABNORMAL LOW (ref 30–89)

## 2013-08-13 NOTE — Telephone Encounter (Signed)
Pt called and stated that when she picked up her new bp meds sticker on bottle stated "do not take while breastfeeding". Please advise pt. Pt uses [facility] on [address]. Pt asked what to do and I told her dont take until she hears back from [place].

## 2013-08-13 NOTE — Telephone Encounter (Signed)
Spoke with pt and reviewed further info--as long as not causing significant diuresis which could decrease milk supply, should be ine to take in pregnancy (no problem to baby, electrolytes normal, etc).  Shouldn't be a problem at 25mg , but if she notices significant decrease in milk supply, she will let us know.  Safe to start taking the HCTZ 25mg  as recommended yesterday

## 2013-09-02 ENCOUNTER — Ambulatory Visit: Payer: Medicaid Other | Admitting: Family Medicine

## 2013-09-11 ENCOUNTER — Emergency Department (HOSPITAL_COMMUNITY)
Admission: EM | Admit: 2013-09-11 | Discharge: 2013-09-11 | Disposition: A | Discharge status: Home / Self Care | Payer: Medicaid Other | Attending: Emergency Medicine | Admitting: Emergency Medicine

## 2013-09-11 ENCOUNTER — Encounter (HOSPITAL_COMMUNITY): Payer: Self-pay | Admitting: Emergency Medicine

## 2013-09-11 DIAGNOSIS — Z87440 Personal history of urinary (tract) infections: Secondary | ICD-10-CM | POA: Insufficient documentation

## 2013-09-11 DIAGNOSIS — J45909 Unspecified asthma, uncomplicated: Secondary | ICD-10-CM | POA: Insufficient documentation

## 2013-09-11 DIAGNOSIS — K029 Dental caries, unspecified: Secondary | ICD-10-CM

## 2013-09-11 DIAGNOSIS — E669 Obesity, unspecified: Secondary | ICD-10-CM | POA: Insufficient documentation

## 2013-09-11 DIAGNOSIS — Z8632 Personal history of gestational diabetes: Secondary | ICD-10-CM | POA: Insufficient documentation

## 2013-09-11 DIAGNOSIS — Z87828 Personal history of other (healed) physical injury and trauma: Secondary | ICD-10-CM | POA: Insufficient documentation

## 2013-09-11 DIAGNOSIS — Z79899 Other long term (current) drug therapy: Secondary | ICD-10-CM | POA: Insufficient documentation

## 2013-09-11 DIAGNOSIS — Z8619 Personal history of other infectious and parasitic diseases: Secondary | ICD-10-CM | POA: Insufficient documentation

## 2013-09-11 DIAGNOSIS — E119 Type 2 diabetes mellitus without complications: Secondary | ICD-10-CM | POA: Insufficient documentation

## 2013-09-11 MED ORDER — AMOXICILLIN 500 MG PO CAPS: 500.0000 mg | ORAL_CAPSULE | Freq: Three times a day (TID) | ORAL | Status: DC

## 2013-09-11 MED ORDER — TRAMADOL HCL 50 MG PO TABS: 50.0000 mg | ORAL_TABLET | Freq: Four times a day (QID) | ORAL | Status: DC | PRN
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
## 2013-09-11 NOTE — Discharge Instructions (Signed)
Dental Caries Dental caries is tooth decay. This decay can cause a hole in teeth (cavity) that can get bigger and deeper over time. HOME CARE  Brush and floss your teeth. Do this at least two times a day.  Use a fluoride toothpaste.  Use a mouth rinse if told by your dentist or doctor.  Eat less sugary and starchy foods. Drink less sugary drinks.  Avoid snacking often on sugary and starchy foods. Avoid sipping often on sugary drinks.  Keep regular checkups and cleanings with your dentist.  Use fluoride supplements if told by your dentist or doctor.  Allow fluoride to be applied to teeth if told by your dentist or doctor. MAKE SURE YOU:  Understand these instructions.  Will watch your condition.  Will get help right away if you are not doing well or get worse. Document Released: 02/20/2008 Document Revised: 01/13/2013 Document Reviewed: 05/15/2012 [facility] Patient Information 2014 [place], [place].    Emergency Department Resource Guide 1) Find a Doctor and Pay Out of Pocket Although you won't have to find out who is covered by your insurance plan, it is a good idea to ask around and get recommendations. You will then need to call the office and see if the doctor you have chosen will accept you as a new patient and what types of options they offer for patients who are self-pay. Some doctors offer discounts or will set up payment plans for their patients who do not have insurance, but you will need to ask so you aren't surprised when you get to your appointment.  2) Contact Your Local Health Department Not all health departments have doctors that can see patients for sick visits, but many do, so it is worth a call to see if yours does. If you don't know where your local health department is, you can check in your phone book. The CDC also has a tool to help you locate your state's health department, and many state websites also have listings of all of their local health  departments.  3) Find a [facility] If your illness is not likely to be very severe or complicated, you may want to try a walk in clinic. These are popping up all over the country in pharmacies, drugstores, and shopping centers. They're usually staffed by nurse practitioners or physician assistants that have been trained to treat common illnesses and complaints. They're usually fairly quick and inexpensive. However, if you have serious medical issues or chronic medical problems, these are probably not your best option.  No Primary Care Doctor: - Call Health Connect at  [phone] - they can help you locate a primary care doctor that  accepts your insurance, provides certain services, etc. - Physician Referral Service- ([phone]  Chronic Pain Problems: Organization         Address  Phone   Notes  [facility]  [phone] Patients need to be referred by their primary care doctor.   Medication Assistance: Organization         Address  Phone   Notes  [facility] Medication [facility] [address] [place], [place] [address] [phone] --Must be a resident of [facility] -- Must have NO insurance coverage whatsoever (no Medicaid/ Medicare, etc.) -- The pt. MUST have a primary care doctor that directs their care regularly and follows them in the community   [facility]  [phone]   [employer]  ([phone]    Agencies that  provide inexpensive medical care: °Organization         Address  Phone   Notes  °[facility]  (336) 832-8035   °[facility]    (336) 832-7272   °[facility] [address] °[place], [place] [address] (336) 832-4777   °[facility] [address], °[place] (336) 271-4999   °[facility]    (336) 373-0678   °[facility]    (336) 272-1050   °[facility] ° [address], [place] Phone:  (336)  [phone], Fax:  (336) 832-4440 Hours of Operation:  9 am - 6 pm, M-F.  Also accepts Medicaid/Medicare and self-pay.  °[facility] ° [address], [place] Phone: (336) [phone], Fax: (336) [phone]. Hours of Operation:  8:30 am - 5:30 pm, M-F.  Also accepts Medicaid and self-pay.  °[facility] [place] [address], [place] Phone: (336) 878-6027   °[facility] [address], [place], [place] (336)723-1848, Ext. 123 Mondays & Thursdays: 7-9 AM.  First 15 patients are seen on a first come, first serve basis. °  ° °Medicaid-accepting [place] Providers: ° °Organization         Address  Phone   Notes  °[facility] [address], [place] (336) [phone] Also accepts self-pay patients.  °[facility] [address], [place] ° (336) 856-9996   °[facility] [address], [place] (336) 288-8857   °[facility] [address] [place] [address], [place] (336) 299-7000   °Veita Bland [address], [place]  ° (336) 373-1557 Only accepts [place] Access Medicaid patients after they have their name applied to their card.  ° °Self-Pay (no insurance) in [place]: ° °Organization         Address  Phone   Notes  °Sickle Cell Patients, [facility] [address], [place] (336) 832-1970   °[facility] [address], [place] (336) 832-4400   °[facility] ° [address], [place] (336) 992-4800   °[facility]/Dr. Osei-Bonsu ° [address] [place] [address], [place] or [address], [place] (336) [phone] Phone number for both [place] and [place] locations is the same.  °[facility] [address], [place] (336) 299-0000   °[facility] [address] [place] [address], [place] or [address] (336)  852-7530 °(336) 878-2260   °[facility] [address], [place] (336) 350-1642, phone; (336) 294-5005, fax Sees patients 1st and 3rd Saturday of every month.  Must not qualify for public or private insurance (i.e. Medicaid, Medicare, [place] Health Choice, Veterans' Benefits) • Household income should be no more than 200% of the poverty level •The clinic cannot treat you if you are pregnant or think you are pregnant • Sexually transmitted diseases are not treated at the clinic.  ° ° °Dental Care: °Organization         Address  Phone  Notes  °[place] [facility] [address], [place] (336) 641-6152 Accepts children up to age 21 who are enrolled in Medicaid or [place] Health Choice; pregnant women with a Medicaid card; and children who have applied for Medicaid or [place] Health   Choice, but were declined, whose parents can pay a reduced fee at time of service.  °[place] [facility] [place]  [address], [place] (336) 641-7733 Accepts children up to age 21 who are enrolled in Medicaid or [place] Health Choice; pregnant women with a Medicaid card; and children who have applied for Medicaid or [place] Health Choice, but were declined, whose parents can pay a reduced fee at time of service.  °[facility] ° [address], [place] (336) 641-4533 Patients are seen by appointment only. Walk-ins are not accepted. [facility] will see patients 18 years of age and older. °Monday - Tuesday (8am-5pm) °Most Wednesdays (8:30-5pm) °$30 per visit, cash only  °[facility] ° [address], [place] (336) 641-4533 Patients are seen by appointment only. Walk-ins are not accepted. [facility] will see patients 18 years of age and older. °One Wednesday Evening (Monthly: Volunteer Based).  $30 per visit, cash only  °[facility]  (919) 537-3737 for adults;  Children under age 4, call Graduate Pediatric Dentistry at ([phone]) [phone]. Children aged 4-14, please call ([phone]) [phone] to request a pediatric application. ° Dental services are provided in all areas of dental care including fillings, crowns and bridges, complete and partial dentures, implants, gum treatment, root canals, and extractions. Preventive care is also provided. Treatment is provided to both adults and children. °Patients are selected via a lottery and there is often a waiting list. °  °[facility] [address], °[place] ° (336) 763-8833 [email or web address] °  °[facility] [address], [place], [place] (336)723-1848, Ext. 123 Second and Fourth Thursday of each month, opens at 6:30 AM; Clinic ends at 9 AM.  Patients are seen on a first-come first-served basis, and a limited number are seen during each clinic.  ° °[facility] ° [address], [place], [place] (336) 723-7904   Eligibility Requirements °You must have lived in [place], [place], or [place] counties for at least the last three months. °  You cannot be eligible for state or federal sponsored healthcare insurance, including Veterans Administration, Medicaid, or Medicare. °  You generally cannot be eligible for healthcare insurance through your employer.  °  How to apply: °Eligibility screenings are held every Tuesday and Wednesday afternoon from 1:00 pm until 4:00 pm. You do not need an appointment for the interview!  °[facility] [address], [place], [place] 336-631-2330   °[facility] [facility]  336-342-8273   °[facility]  336-703-3100   °[facility]  336-570-6415   ° °Behavioral Health Resources in the Community: °Intensive Outpatient Programs °Organization         Address  Phone  Notes  °[facility] [address], [place], [place] 336-878-6098   °[facility] [address], [place], [place] 336-832-9800   °[facility] [address], [place], [place] ° 336-882-2125   °[facility] [address],  °[place],  1-800-853-5163 or 336-641-4981   °Substance Abuse Resources °Organization         Address  Phone  Notes  °[facility]  336-882-2125   °[facility]  336-784-9470   °[facility]  336-285-9073   °[facility]  336-845-3988   °Residential & Outpatient Substance Abuse Program  1-800-659-3381   °  Psychological Services °Organization         Address  Phone  Notes  °[facility]  336- 832-9600   °[facility]  336- 378-7881   °[facility] [address], [place] 1-800-853-5163 or 336-641-4981   ° °Mobile Crisis Teams °Organization         Address  Phone  Notes  °[facility], Mobile Crisis Care Unit  1-877-626-1772   °Assertive °[facility] ° [address] [place], [place] 336-834-9664   °Sharon DeEsch [address] °[place] 336-554-5454   ° °Self-Help/Support Groups °Organization         Address  Phone             Notes  °[facility] [employer] - variety of support groups  336- 373-1402 Call for more information  °Narcotics Anonymous (NA), [facility] [address], °[place]  2 meetings at this location  ° °Residential Treatment Programs °Organization         Address  Phone  Notes  °[facility] [address],    °[place]  1-866-801-8205   °[facility] ° [address], [place], [place] 704-293-8524   °[facility] [address], [place] 336-845-3988 Admissions: 8am-3pm M-F  °[facility] [address]    °[place], [place] 336-841-1104   °[facility] [address], [place], [place] 336-379-7146   °[facility] [address]  °[place], [place] 336-285-9073   °[facility] - Intensive  Outpatient [address], [place], [place] 336-852-3033   °[facility] [address]  °[place], [place] 1-877-615-2722 or 336-784-9470   °[facility] [address] [place], [place] 336-227-7417 Accepts Medicaid  °[facility] [address]  °[place] 1-800-659-3381 Substance Abuse/Addiction Treatment  ° °[place] Behavioral Health Resources °Organization         Address  Phone  Notes  °[facility]  (888) 581-9988   °Marcea Rojek Brannon, PhD [address] [place], [place]   (336) 349-5553 or (336) 951-0000   °[facility]   [address] °[place], [place] (336) 349-4454   °[facility] [address], [place], [place] (336) 342-8316 Insurance/Medicaid/sponsorship through [facility]  °[facility] [address]                                    [place], [place] (336) 342-8316 Therapy/tele-psych/case  °[facility] [address]  ° [place], [place] (336) 349-2233    °Dr. Arfeen  (336) 349-4544   °[facility] [place]  [employer] [place] [facility] 1) [address], [place] °2) [address], [place] °3)  [address], [place] (336) 349-3220 °(336) 342-7768 ° °(336) 342-8140   °[place] [facility] (336) 342-1394 or (336) 342-3537 (After Hours)    ° ° ° °

## 2013-09-11 NOTE — ED Notes (Signed)
The pt is c/o a toothache all day and her bp is high

## 2013-09-16 ENCOUNTER — Ambulatory Visit: Payer: Medicaid Other | Admitting: Family Medicine

## 2013-09-22 ENCOUNTER — Ambulatory Visit (INDEPENDENT_AMBULATORY_CARE_PROVIDER_SITE_OTHER): Payer: Medicaid Other | Admitting: Family Medicine

## 2013-09-22 ENCOUNTER — Encounter: Payer: Self-pay | Admitting: Family Medicine

## 2013-09-22 VITALS — BP 168/108 | HR 72 | Ht 64.0 in | Wt 283.0 lb

## 2013-09-22 DIAGNOSIS — G4733 Obstructive sleep apnea (adult) (pediatric): Secondary | ICD-10-CM

## 2013-09-22 DIAGNOSIS — I10 Essential (primary) hypertension: Secondary | ICD-10-CM

## 2013-09-22 DIAGNOSIS — E7800 Pure hypercholesterolemia, unspecified: Secondary | ICD-10-CM

## 2013-09-22 DIAGNOSIS — E559 Vitamin D deficiency, unspecified: Secondary | ICD-10-CM

## 2013-09-22 MED ORDER — METHYLDOPA 500 MG PO TABS: 500.0000 mg | ORAL_TABLET | Freq: Two times a day (BID) | ORAL | Status: DC

## 2013-09-22 MED ORDER — HYDROCHLOROTHIAZIDE 25 MG PO TABS: ORAL_TABLET | ORAL | Status: DC
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
## 2013-09-22 NOTE — Patient Instructions (Signed)
Restart aldomet 500mg  BID, and decrease dose of HCTZ to 1/2 tablet in order to improve milk supply, and keep BP at goal. Start the aldomet at just 1/2 tablet twice daily, along with the HCTZ 1/2 tablet.  If BP's are >140/90, then increase to full tablet twice daily.  E-mail through [employer] of blood pressures (along with the dose of medication you are taking) in 2 weeks.  And also let us know if milk supply has improved.  Continue the lowfat, low cholesterol diet as we discussed. Try and exercise at least 30 minutes daily  Fat and Cholesterol Control Diet Fat and cholesterol levels in your blood and organs are influenced by your diet. High levels of fat and cholesterol may lead to diseases of the heart, small and large blood vessels, gallbladder, liver, and pancreas. CONTROLLING FAT AND CHOLESTEROL WITH DIET Although exercise and lifestyle factors are important, your diet is key. That is because certain foods are known to raise cholesterol and others to lower it. The goal is to balance foods for their effect on cholesterol and more importantly, to replace saturated and trans fat with other types of fat, such as monounsaturated fat, polyunsaturated fat, and omega-3 fatty acids. On average, a person should consume no more than 15 to 17 g of saturated fat daily. Saturated and trans fats are considered "bad" fats, and they will raise LDL cholesterol. Saturated fats are primarily found in animal products such as meats, butter, and cream. However, that does not mean you need to give up all your favorite foods. Today, there are good tasting, low-fat, low-cholesterol substitutes for most of the things you like to eat. Choose low-fat or nonfat alternatives. Choose round or loin cuts of red meat. These types of cuts are lowest in fat and cholesterol. Chicken (without the skin), fish, veal, and ground [place] breast are great choices. Eliminate fatty meats, such as hot dogs and salami. Even shellfish have  little or no saturated fat. Have a 3 oz (85 g) portion when you eat lean meat, poultry, or fish. Trans fats are also called "partially hydrogenated oils." They are oils that have been scientifically manipulated so that they are solid at room temperature resulting in a longer shelf life and improved taste and texture of foods in which they are added. Trans fats are found in stick margarine, some tub margarines, cookies, crackers, and baked goods.  When baking and cooking, oils are a great substitute for butter. The monounsaturated oils are especially beneficial since it is believed they lower LDL and raise HDL. The oils you should avoid entirely are saturated tropical oils, such as coconut and palm.  Remember to eat a lot from food groups that are naturally free of saturated and trans fat, including fish, fruit, vegetables, beans, grains (barley, rice, couscous, bulgur wheat), and pasta (without cream sauces).  IDENTIFYING FOODS THAT LOWER FAT AND CHOLESTEROL  Soluble fiber may lower your cholesterol. This type of fiber is found in fruits such as apples, vegetables such as broccoli, potatoes, and carrots, legumes such as beans, peas, and lentils, and grains such as barley. Foods fortified with plant sterols (phytosterol) may also lower cholesterol. You should eat at least 2 g per day of these foods for a cholesterol lowering effect.  Read package labels to identify low-saturated fats, trans fat free, and low-fat foods at the supermarket. Select cheeses that have only 2 to 3 g saturated fat per ounce. Use a heart-healthy tub margarine that is free of trans  fats or partially hydrogenated oil. When buying baked goods (cookies, crackers), avoid partially hydrogenated oils. Breads and muffins should be made from whole grains (whole-wheat or whole oat flour, instead of "flour" or "enriched flour"). Buy non-creamy canned soups with reduced salt and no added fats.  FOOD PREPARATION TECHNIQUES  Never deep-fry. If you  must fry, either stir-fry, which uses very little fat, or use non-stick cooking sprays. When possible, broil, bake, or roast meats, and steam vegetables. Instead of putting butter or margarine on vegetables, use lemon and herbs, applesauce, and cinnamon (for squash and sweet potatoes). Use nonfat yogurt, salsa, and low-fat dressings for salads.  LOW-SATURATED FAT / LOW-FAT FOOD SUBSTITUTES Meats / Saturated Fat (g)  Avoid: Steak, marbled (3 oz/85 g) / 11 g  Choose: Steak, lean (3 oz/85 g) / 4 g  Avoid: Hamburger (3 oz/85 g) / 7 g  Choose: Hamburger, lean (3 oz/85 g) / 5 g  Avoid: Ham (3 oz/85 g) / 6 g  Choose: Ham, lean cut (3 oz/85 g) / 2.4 g  Avoid: Chicken, with skin, dark meat (3 oz/85 g) / 4 g  Choose: Chicken, skin removed, dark meat (3 oz/85 g) / 2 g  Avoid: Chicken, with skin, light meat (3 oz/85 g) / 2.5 g  Choose: Chicken, skin removed, light meat (3 oz/85 g) / 1 g Dairy / Saturated Fat (g)  Avoid: Whole milk (1 cup) / 5 g  Choose: Low-fat milk, 2% (1 cup) / 3 g  Choose: Low-fat milk, 1% (1 cup) / 1.5 g  Choose: Skim milk (1 cup) / 0.3 g  Avoid: Hard cheese (1 oz/28 g) / 6 g  Choose: Skim milk cheese (1 oz/28 g) / 2 to 3 g  Avoid: Cottage cheese, 4% fat (1 cup) / 6.5 g  Choose: Low-fat cottage cheese, 1% fat (1 cup) / 1.5 g  Avoid: Ice cream (1 cup) / 9 g  Choose: Sherbet (1 cup) / 2.5 g  Choose: Nonfat frozen yogurt (1 cup) / 0.3 g  Choose: Frozen fruit bar / trace  Avoid: Whipped cream (1 tbs) / 3.5 g  Choose: Nondairy whipped topping (1 tbs) / 1 g Condiments / Saturated Fat (g)  Avoid: Mayonnaise (1 tbs) / 2 g  Choose: Low-fat mayonnaise (1 tbs) / 1 g  Avoid: Butter (1 tbs) / 7 g  Choose: Extra light margarine (1 tbs) / 1 g  Avoid: Coconut oil (1 tbs) / 11.8 g  Choose: Olive oil (1 tbs) / 1.8 g  Choose: Corn oil (1 tbs) / 1.7 g  Choose: Safflower oil (1 tbs) / 1.2 g  Choose: Sunflower oil (1 tbs) / 1.4 g  Choose: Soybean oil (1 tbs)  / 2.4 g  Choose: Canola oil (1 tbs) / 1 g Document Released: 05/13/2005 Document Revised: 09/07/2012 Document Reviewed: 11/01/2010 Exit[employer] Patient Information 2014 [place], [place].

## 2014-01-05 ENCOUNTER — Emergency Department (INDEPENDENT_AMBULATORY_CARE_PROVIDER_SITE_OTHER)
Admission: EM | Admit: 2014-01-05 | Discharge: 2014-01-05 | Disposition: A | Discharge status: Home / Self Care | Payer: Medicaid Other | Source: Home / Self Care | Attending: Family Medicine | Admitting: Family Medicine

## 2014-01-05 ENCOUNTER — Encounter (HOSPITAL_COMMUNITY): Payer: Self-pay | Admitting: Emergency Medicine

## 2014-01-05 DIAGNOSIS — R071 Chest pain on breathing: Secondary | ICD-10-CM

## 2014-01-05 DIAGNOSIS — R0789 Other chest pain: Secondary | ICD-10-CM

## 2014-01-05 NOTE — ED Notes (Signed)
Pt c/o constant chest pain w/intermittent shooting pains when lifting heavy objects Sx also include mild HA and dizziness BP today is 148/94; last had her BP meds last week Denies SOB, dyspnea, weakness, diaphoresis Alert and talking in complete sentences w/no signs of acute distress.
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
## 2014-02-08 ENCOUNTER — Ambulatory Visit (INDEPENDENT_AMBULATORY_CARE_PROVIDER_SITE_OTHER): Payer: Medicaid Other | Admitting: Family Medicine

## 2014-02-08 ENCOUNTER — Encounter: Payer: Self-pay | Admitting: Family Medicine

## 2014-02-08 VITALS — BP 148/104 | HR 72 | Ht 64.0 in | Wt 290.0 lb

## 2014-02-08 DIAGNOSIS — I10 Essential (primary) hypertension: Secondary | ICD-10-CM

## 2014-02-08 DIAGNOSIS — E119 Type 2 diabetes mellitus without complications: Secondary | ICD-10-CM

## 2014-02-08 DIAGNOSIS — G4733 Obstructive sleep apnea (adult) (pediatric): Secondary | ICD-10-CM

## 2014-02-08 LAB — POCT GLYCOSYLATED HEMOGLOBIN (HGB A1C): HEMOGLOBIN A1C: 5.9

## 2014-02-08 MED ORDER — METHYLDOPA 500 MG PO TABS: 500.0000 mg | ORAL_TABLET | Freq: Two times a day (BID) | ORAL | Status: DC
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
## 2014-02-08 NOTE — Patient Instructions (Signed)
Continue current meds.  Monitor blood pressure more frequently (ie twice daily)--keep a log, with columns for date, morning, evening and "comments"--use the comments to explain readings (ie headache, missed pill, salty diet, dizziness).  Consider using allergy medications like claritin or zyrtec if you have ongoing congestions (related to allergies, rather than a cold).    It is recommended that you get at least 30 minutes of aerobic exercise at least 5 days/week (for weight loss, you may need as much as 60-90 minutes). This can be any activity that gets your heart rate up. This can be divided in 10-15 minute intervals if needed, but try and build up your endurance at least once a week.  Weight bearing exercise is also recommended twice weekly.  Low-Sodium Eating Plan Sodium raises blood pressure and causes water to be held in the body. Getting less sodium from food will help lower your blood pressure, reduce any swelling, and protect your heart, liver, and kidneys. We get sodium by adding salt (sodium chloride) to food. Most of our sodium comes from canned, boxed, and frozen foods. Restaurant foods, fast foods, and pizza are also very high in sodium. Even if you take medicine to lower your blood pressure or to reduce fluid in your body, getting less sodium from your food is important. WHAT IS MY PLAN? Most people should limit their sodium intake to 2,300 mg a day. Your health care provider recommends that you limit your sodium intake to __________ a day.  WHAT DO I NEED TO KNOW ABOUT THIS EATING PLAN? For the low-sodium eating plan, you will follow these general guidelines:  Choose foods with a % Daily Value for sodium of less than 5% (as listed on the food label).   Use salt-free seasonings or herbs instead of table salt or sea salt.   Check with your health care provider or pharmacist before using salt substitutes.   Eat fresh foods.  Eat more vegetables and fruits.  Limit canned  vegetables. If you do use them, rinse them well to decrease the sodium.   Limit cheese to 1 oz (28 g) per day.   Eat lower-sodium products, often labeled as "lower sodium" or "no salt added."  Avoid foods that contain monosodium glutamate (MSG). MSG is sometimes added to [place] food and some canned foods.  Check food labels (Nutrition Facts labels) on foods to learn how much sodium is in one serving.  Eat more home-cooked food and less restaurant, buffet, and fast food.  When eating at a restaurant, ask that your food be prepared with less salt or none, if possible.  HOW DO I READ FOOD LABELS FOR SODIUM INFORMATION? The Nutrition Facts label lists the amount of sodium in one serving of the food. If you eat more than one serving, you must multiply the listed amount of sodium by the number of servings. Food labels may also identify foods as:  Sodium free--Less than 5 mg in a serving.  Very low sodium--35 mg or less in a serving.  Low sodium--140 mg or less in a serving.  Light in sodium--50% less sodium in a serving. For example, if a food that usually has 300 mg of sodium is changed to become light in sodium, it will have 150 mg of sodium.  Reduced sodium--25% less sodium in a serving. For example, if a food that usually has 400 mg of sodium is changed to reduced sodium, it will have 300 mg of sodium. WHAT FOODS CAN I EAT? Grains Low-sodium  cereals, including oats, puffed wheat and rice, and shredded wheat cereals. Low-sodium crackers. Unsalted rice and pasta. Lower-sodium bread.  Vegetables Frozen or fresh vegetables. Low-sodium or reduced-sodium canned vegetables. Low-sodium or reduced-sodium tomato sauce and paste. Low-sodium or reduced-sodium tomato and vegetable juices.  Fruits Fresh, frozen, and canned fruit. Fruit juice.  Meat and Other Protein Products Low-sodium canned tuna and salmon. Fresh or frozen meat, poultry, seafood, and fish. Lamb. Unsalted nuts. Dried  beans, peas, and lentils without added salt. Unsalted canned beans. Homemade soups without salt. Eggs.  Dairy Milk. Soy milk. Ricotta cheese. Low-sodium or reduced-sodium cheeses. Yogurt.  Condiments Fresh and dried herbs and spices. Salt-free seasonings. Onion and garlic powders. Low-sodium varieties of mustard and ketchup. Lemon juice.  Fats and Oils Reduced-sodium salad dressings. Unsalted butter.  Other Unsalted popcorn and pretzels.  The items listed above may not be a complete list of recommended foods or beverages. Contact your dietitian for more options. WHAT FOODS ARE NOT RECOMMENDED? Grains Instant hot cereals. Bread stuffing, pancake, and biscuit mixes. Croutons. Seasoned rice or pasta mixes. Noodle soup cups. Boxed or frozen macaroni and cheese. Self-rising flour. Regular salted crackers. Vegetables Regular canned vegetables. Regular canned tomato sauce and paste. Regular tomato and vegetable juices. Frozen vegetables in sauces. Salted french fries. Olives. Angie Fava. Relishes. Sauerkraut. Salsa. Meat and Other Protein Products Salted, canned, smoked, spiced, or pickled meats, seafood, or fish. Bacon, ham, sausage, hot dogs, corned beef, chipped beef, and packaged luncheon meats. Salt pork. Jerky. Pickled herring. Anchovies, regular canned tuna, and sardines. Salted nuts. Dairy Processed cheese and cheese spreads. Cheese curds. Blue cheese and cottage cheese. Buttermilk.  Condiments Onion and garlic salt, seasoned salt, table salt, and sea salt. Canned and packaged gravies. Worcestershire sauce. Tartar sauce. Barbecue sauce. Teriyaki sauce. Soy sauce, including reduced sodium. Steak sauce. Fish sauce. Oyster sauce. Cocktail sauce. Horseradish. Regular ketchup and mustard. Meat flavorings and tenderizers. Bouillon cubes. Hot sauce. Tabasco sauce. Marinades. Taco seasonings. Relishes. Fats and Oils Regular salad dressings. Salted butter. Margarine. Ghee. Bacon fat.   Other Potato and tortilla chips. Corn chips and puffs. Salted popcorn and pretzels. Canned or dried soups. Pizza. Frozen entrees and pot pies.  The items listed above may not be a complete list of foods and beverages to avoid. Contact your dietitian for more information. Document Released: 11/02/2001 Document Revised: 05/18/2013 Document Reviewed: 03/17/2013 [facility] Patient Information 2015 [place], [place]. This information is not intended to replace advice given to you by your health care provider. Make sure you discuss any questions you have with your health care provider.

## 2014-03-09 ENCOUNTER — Encounter: Payer: Medicaid Other | Admitting: Family Medicine

## 2014-03-17 ENCOUNTER — Encounter: Payer: Self-pay | Admitting: Family Medicine

## 2014-03-28 ENCOUNTER — Encounter: Payer: Self-pay | Admitting: Family Medicine

## 2014-04-17 ENCOUNTER — Emergency Department (HOSPITAL_COMMUNITY): Payer: Medicaid Other

## 2014-04-17 ENCOUNTER — Encounter (HOSPITAL_COMMUNITY): Payer: Self-pay | Admitting: Emergency Medicine

## 2014-04-17 ENCOUNTER — Emergency Department (HOSPITAL_COMMUNITY)
Admission: EM | Admit: 2014-04-17 | Discharge: 2014-04-17 | Disposition: A | Discharge status: Home / Self Care | Payer: Medicaid Other | Source: Home / Self Care | Attending: Family Medicine | Admitting: Family Medicine

## 2014-04-17 ENCOUNTER — Encounter (HOSPITAL_COMMUNITY): Payer: Self-pay

## 2014-04-17 ENCOUNTER — Emergency Department (HOSPITAL_COMMUNITY)
Admission: EM | Admit: 2014-04-17 | Discharge: 2014-04-17 | Disposition: A | Discharge status: Home / Self Care | Payer: Medicaid Other | Attending: Emergency Medicine | Admitting: Emergency Medicine

## 2014-04-17 DIAGNOSIS — Z87440 Personal history of urinary (tract) infections: Secondary | ICD-10-CM | POA: Insufficient documentation

## 2014-04-17 DIAGNOSIS — Z8632 Personal history of gestational diabetes: Secondary | ICD-10-CM | POA: Insufficient documentation

## 2014-04-17 DIAGNOSIS — Z872 Personal history of diseases of the skin and subcutaneous tissue: Secondary | ICD-10-CM | POA: Diagnosis not present

## 2014-04-17 DIAGNOSIS — E119 Type 2 diabetes mellitus without complications: Secondary | ICD-10-CM | POA: Diagnosis not present

## 2014-04-17 DIAGNOSIS — Z79899 Other long term (current) drug therapy: Secondary | ICD-10-CM | POA: Insufficient documentation

## 2014-04-17 DIAGNOSIS — E669 Obesity, unspecified: Secondary | ICD-10-CM | POA: Insufficient documentation

## 2014-04-17 DIAGNOSIS — R0602 Shortness of breath: Secondary | ICD-10-CM | POA: Diagnosis present

## 2014-04-17 DIAGNOSIS — Z8742 Personal history of other diseases of the female genital tract: Secondary | ICD-10-CM | POA: Insufficient documentation

## 2014-04-17 DIAGNOSIS — H6693 Otitis media, unspecified, bilateral: Secondary | ICD-10-CM | POA: Diagnosis not present

## 2014-04-17 DIAGNOSIS — Z87828 Personal history of other (healed) physical injury and trauma: Secondary | ICD-10-CM | POA: Diagnosis not present

## 2014-04-17 DIAGNOSIS — R0600 Dyspnea, unspecified: Secondary | ICD-10-CM

## 2014-04-17 DIAGNOSIS — R0682 Tachypnea, not elsewhere classified: Secondary | ICD-10-CM

## 2014-04-17 DIAGNOSIS — J45901 Unspecified asthma with (acute) exacerbation: Secondary | ICD-10-CM | POA: Insufficient documentation

## 2014-04-17 DIAGNOSIS — I10 Essential (primary) hypertension: Secondary | ICD-10-CM | POA: Diagnosis not present

## 2014-04-17 DIAGNOSIS — R000 Tachycardia, unspecified: Secondary | ICD-10-CM | POA: Insufficient documentation

## 2014-04-17 LAB — CBC WITH DIFFERENTIAL/PLATELET
Basophils Absolute: 0 10*3/uL (ref 0.0–0.1)
Basophils Relative: 0 % (ref 0–1)
Eosinophils Absolute: 0.2 10*3/uL (ref 0.0–0.7)
Eosinophils Relative: 2 % (ref 0–5)
HEMATOCRIT: 35 % — AB (ref 36.0–46.0)
HEMOGLOBIN: 11.6 g/dL — AB (ref 12.0–15.0)
LYMPHS ABS: 2.2 10*3/uL (ref 0.7–4.0)
LYMPHS PCT: 32 % (ref 12–46)
MCH: 25.1 pg — ABNORMAL LOW (ref 26.0–34.0)
MCHC: 33.1 g/dL (ref 30.0–36.0)
MCV: 75.6 fL — AB (ref 78.0–100.0)
MONO ABS: 0.5 10*3/uL (ref 0.1–1.0)
Monocytes Relative: 8 % (ref 3–12)
Neutro Abs: 4 10*3/uL (ref 1.7–7.7)
Neutrophils Relative %: 58 % (ref 43–77)
Platelets: 262 10*3/uL (ref 150–400)
RBC: 4.63 MIL/uL (ref 3.87–5.11)
RDW: 14.2 % (ref 11.5–15.5)
WBC: 6.9 10*3/uL (ref 4.0–10.5)

## 2014-04-17 LAB — BASIC METABOLIC PANEL
Anion gap: 18 — ABNORMAL HIGH (ref 5–15)
BUN: 11 mg/dL (ref 6–23)
CO2: 21 meq/L (ref 19–32)
CREATININE: 0.7 mg/dL (ref 0.50–1.10)
Calcium: 9.9 mg/dL (ref 8.4–10.5)
Chloride: 99 mEq/L (ref 96–112)
GFR calc Af Amer: >90 mL/min (ref >90)
GFR calc non Af Amer: >90 mL/min (ref >90)
GLUCOSE: 139 mg/dL — AB (ref 70–99)
Potassium: 2.6 mEq/L — CL (ref 3.7–5.3)
Sodium: 138 mEq/L (ref 137–147)

## 2014-04-17 LAB — CBG MONITORING, ED: Glucose-Capillary: 110 mg/dL — ABNORMAL HIGH (ref 70–99)

## 2014-04-17 LAB — I-STAT BETA HCG BLOOD, ED (MC, WL, AP ONLY): I-stat hCG, quantitative: <5 m[IU]/mL (ref <5)

## 2014-04-17 LAB — D-DIMER, QUANTITATIVE: D-Dimer, Quant: 0.37 ug/mL-FEU (ref 0.00–0.48)

## 2014-04-17 MED ORDER — IPRATROPIUM-ALBUTEROL 0.5-2.5 (3) MG/3ML IN SOLN: 5.0000 mL | Freq: Once | RESPIRATORY_TRACT | Status: DC

## 2014-04-17 MED ORDER — ALBUTEROL (5 MG/ML) CONTINUOUS INHALATION SOLN
10.0000 mg/h | INHALATION_SOLUTION | RESPIRATORY_TRACT | Status: AC
Start: 2014-04-17 — End: 2014-04-17
  Administered 2014-04-17: 10 mg/h via RESPIRATORY_TRACT
  Filled 2014-04-17: qty 20

## 2014-04-17 MED ORDER — PREDNISONE 20 MG PO TABS: 40.0000 mg | ORAL_TABLET | Freq: Every day | ORAL | Status: DC

## 2014-04-17 MED ORDER — PREDNISONE 20 MG PO TABS
ORAL_TABLET | ORAL | Status: AC
  Filled 2014-04-17: qty 3

## 2014-04-17 MED ORDER — ALBUTEROL SULFATE (5 MG/ML) 0.5% IN NEBU
5.0000 mg | INHALATION_SOLUTION | Freq: Once | RESPIRATORY_TRACT | Status: AC
  Administered 2014-04-17: 5 mg via RESPIRATORY_TRACT

## 2014-04-17 MED ORDER — ALBUTEROL SULFATE HFA 108 (90 BASE) MCG/ACT IN AERS: 1.0000 | INHALATION_SPRAY | Freq: Four times a day (QID) | RESPIRATORY_TRACT | Status: DC | PRN

## 2014-04-17 MED ORDER — AZITHROMYCIN 250 MG PO TABS: 250.0000 mg | ORAL_TABLET | Freq: Every day | ORAL | Status: DC

## 2014-04-17 MED ORDER — ALBUTEROL SULFATE (2.5 MG/3ML) 0.083% IN NEBU
INHALATION_SOLUTION | RESPIRATORY_TRACT | Status: AC
  Filled 2014-04-17: qty 3

## 2014-04-17 MED ORDER — ALBUTEROL SULFATE (2.5 MG/3ML) 0.083% IN NEBU
INHALATION_SOLUTION | RESPIRATORY_TRACT | Status: AC
  Administered 2014-04-17: 5 mg
  Filled 2014-04-17: qty 6

## 2014-04-17 MED ORDER — PREDNISONE 20 MG PO TABS
60.0000 mg | ORAL_TABLET | Freq: Once | ORAL | Status: AC
  Administered 2014-04-17: 60 mg via ORAL

## 2014-04-17 NOTE — Discharge Instructions (Signed)
Call for a follow up appointment with a Family or Primary Care Provider.  °Return if Symptoms worsen.   °Take medication as prescribed.  ° °
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
## 2014-04-17 NOTE — ED Notes (Signed)
Patient transported to X-ray 

## 2014-04-17 NOTE — ED Notes (Addendum)
Pt presents with SOB with rest that worsens with exertion since Thursday, pt with obvious accessory muscle use in triage. Pt seen at [facility] and transferred here for further evaluation. Pt also reports feeling "full in the head," like she is "under water." pt states her symptoms started after receiving her flu injection on Thursday

## 2014-04-17 NOTE — ED Notes (Signed)
Harvie Heck, PA notified of K level

## 2014-04-17 NOTE — ED Notes (Signed)
Pt SpO2 93-97% and pulse 50bpm while ambulating.

## 2014-04-17 NOTE — ED Provider Notes (Signed)
Medical screening examination/treatment/procedure(s) were conducted as a shared visit with non-physician practitioner(s) and myself.  I personally evaluated the patient during the encounter.   EKG Interpretation   Date/Time:  Sunday April 17 2014 20:39:38 EST Ventricular Rate:  106 PR Interval:  122 QRS Duration: 111 QT Interval:  395 QTC Calculation: 525 R Axis:   84 Text Interpretation:  Sinus tachycardia RSR' in V1 or V2, probably normal  variant Minimal ST depression, diffuse leads Prolonged QT interval  Confirmed by Zenia Resides  MD, Davyn Morandi ([id number]) on 04/17/2014 9:22:47 PM     Patient here with shortness of breath 3 days. Was seen at urgent care Center and treated for asthma sent here for further evaluation. Patient remains ischemic at this time. Lung exam is without wheezing although she does have good air movement. We'll treat with albuterol here in evaluate for pulmonary embolism  Leota Jacobsen, MD 04/17/14 2142
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
## 2014-04-17 NOTE — ED Notes (Signed)
Pt has been short of breath since Thursday.

## 2014-04-17 NOTE — Discharge Instructions (Signed)
Please report directly to the [facility] ER for further treatment.

## 2014-06-13 ENCOUNTER — Encounter (HOSPITAL_COMMUNITY): Payer: Self-pay

## 2014-06-13 ENCOUNTER — Emergency Department (INDEPENDENT_AMBULATORY_CARE_PROVIDER_SITE_OTHER)
Admission: EM | Admit: 2014-06-13 | Discharge: 2014-06-13 | Disposition: A | Discharge status: Home / Self Care | Payer: 59 | Source: Home / Self Care | Attending: Emergency Medicine | Admitting: Emergency Medicine

## 2014-06-13 DIAGNOSIS — K047 Periapical abscess without sinus: Secondary | ICD-10-CM

## 2014-06-13 DIAGNOSIS — S025XXA Fracture of tooth (traumatic), initial encounter for closed fracture: Secondary | ICD-10-CM

## 2014-06-13 MED ORDER — PENICILLIN V POTASSIUM 500 MG PO TABS: 500.0000 mg | ORAL_TABLET | Freq: Four times a day (QID) | ORAL | Status: AC

## 2014-06-13 MED ORDER — IBUPROFEN 800 MG PO TABS: 400.0000 mg | ORAL_TABLET | Freq: Three times a day (TID) | ORAL | Status: DC | PRN
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
## 2014-06-13 NOTE — Discharge Instructions (Signed)
You have an infection around the broken tooth. Take penicillin 4 times a day for 10 days. Use Ibuprofen 800mg  every 8 hours as needed for pain. You can use the Ora-gel as needed.  Follow up with a dentist as soon as you can to get the tooth pulled.

## 2014-06-13 NOTE — ED Notes (Signed)
C/o has a bad tooth lower mid/front x a few days. Foams up w H2O2, waiting for  Her dental insurance to kick in for her to see a dentist

## 2014-07-16 ENCOUNTER — Encounter (HOSPITAL_COMMUNITY): Payer: Self-pay | Admitting: *Deleted

## 2014-07-16 ENCOUNTER — Emergency Department (HOSPITAL_COMMUNITY): Payer: 59

## 2014-07-16 ENCOUNTER — Emergency Department (HOSPITAL_COMMUNITY)
Admission: EM | Admit: 2014-07-16 | Discharge: 2014-07-16 | Disposition: A | Discharge status: Home / Self Care | Payer: 59 | Attending: Emergency Medicine | Admitting: Emergency Medicine

## 2014-07-16 DIAGNOSIS — Z87440 Personal history of urinary (tract) infections: Secondary | ICD-10-CM | POA: Diagnosis not present

## 2014-07-16 DIAGNOSIS — Z872 Personal history of diseases of the skin and subcutaneous tissue: Secondary | ICD-10-CM | POA: Insufficient documentation

## 2014-07-16 DIAGNOSIS — E669 Obesity, unspecified: Secondary | ICD-10-CM | POA: Diagnosis not present

## 2014-07-16 DIAGNOSIS — E119 Type 2 diabetes mellitus without complications: Secondary | ICD-10-CM | POA: Diagnosis not present

## 2014-07-16 DIAGNOSIS — Z79899 Other long term (current) drug therapy: Secondary | ICD-10-CM | POA: Insufficient documentation

## 2014-07-16 DIAGNOSIS — Z8632 Personal history of gestational diabetes: Secondary | ICD-10-CM | POA: Diagnosis not present

## 2014-07-16 DIAGNOSIS — J45901 Unspecified asthma with (acute) exacerbation: Secondary | ICD-10-CM | POA: Diagnosis not present

## 2014-07-16 DIAGNOSIS — Z792 Long term (current) use of antibiotics: Secondary | ICD-10-CM | POA: Insufficient documentation

## 2014-07-16 DIAGNOSIS — Z8742 Personal history of other diseases of the female genital tract: Secondary | ICD-10-CM | POA: Diagnosis not present

## 2014-07-16 DIAGNOSIS — J45909 Unspecified asthma, uncomplicated: Secondary | ICD-10-CM | POA: Diagnosis present

## 2014-07-16 DIAGNOSIS — I10 Essential (primary) hypertension: Secondary | ICD-10-CM | POA: Insufficient documentation

## 2014-07-16 MED ORDER — IPRATROPIUM BROMIDE 0.02 % IN SOLN
0.5000 mg | Freq: Once | RESPIRATORY_TRACT | Status: AC
  Administered 2014-07-16: 0.5 mg via RESPIRATORY_TRACT
  Filled 2014-07-16: qty 2.5

## 2014-07-16 MED ORDER — PREDNISONE 50 MG PO TABS: 50.0000 mg | ORAL_TABLET | Freq: Every day | ORAL | Status: AC

## 2014-07-16 MED ORDER — BENZONATATE 100 MG PO CAPS: 100.0000 mg | ORAL_CAPSULE | Freq: Three times a day (TID) | ORAL | Status: DC

## 2014-07-16 MED ORDER — METHYLPREDNISOLONE SODIUM SUCC 125 MG IJ SOLR
125.0000 mg | Freq: Once | INTRAMUSCULAR | Status: AC
  Administered 2014-07-16: 125 mg via INTRAMUSCULAR
  Filled 2014-07-16: qty 2

## 2014-07-16 MED ORDER — ALBUTEROL (5 MG/ML) CONTINUOUS INHALATION SOLN
10.0000 mg/h | INHALATION_SOLUTION | RESPIRATORY_TRACT | Status: DC
  Administered 2014-07-16: 10 mg/h via RESPIRATORY_TRACT
  Filled 2014-07-16: qty 20

## 2014-07-16 MED ORDER — ALBUTEROL SULFATE (2.5 MG/3ML) 0.083% IN NEBU: 2.5000 mg | INHALATION_SOLUTION | RESPIRATORY_TRACT | Status: DC | PRN

## 2014-07-16 MED ORDER — ALBUTEROL SULFATE (2.5 MG/3ML) 0.083% IN NEBU
5.0000 mg | INHALATION_SOLUTION | Freq: Once | RESPIRATORY_TRACT | Status: AC
  Administered 2014-07-16: 5 mg via RESPIRATORY_TRACT
  Filled 2014-07-16: qty 6

## 2014-07-16 NOTE — Discharge Instructions (Signed)
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
## 2014-07-16 NOTE — ED Notes (Signed)
The pt has asthma and she has a cold low grade temp coughing  Productive green thick mucous.   lmp now.  She ran out of meds at home

## 2014-08-25 ENCOUNTER — Ambulatory Visit (INDEPENDENT_AMBULATORY_CARE_PROVIDER_SITE_OTHER): Payer: 59 | Admitting: Family Medicine

## 2014-08-25 ENCOUNTER — Other Ambulatory Visit: Payer: Self-pay | Admitting: Family Medicine

## 2014-08-25 ENCOUNTER — Encounter: Payer: Self-pay | Admitting: Family Medicine

## 2014-08-25 VITALS — BP 158/108 | HR 72 | Ht 64.0 in | Wt 297.8 lb

## 2014-08-25 DIAGNOSIS — Z5181 Encounter for therapeutic drug level monitoring: Secondary | ICD-10-CM | POA: Diagnosis not present

## 2014-08-25 DIAGNOSIS — E559 Vitamin D deficiency, unspecified: Secondary | ICD-10-CM

## 2014-08-25 DIAGNOSIS — J4520 Mild intermittent asthma, uncomplicated: Secondary | ICD-10-CM

## 2014-08-25 DIAGNOSIS — I10 Essential (primary) hypertension: Secondary | ICD-10-CM

## 2014-08-25 DIAGNOSIS — G4733 Obstructive sleep apnea (adult) (pediatric): Secondary | ICD-10-CM | POA: Diagnosis not present

## 2014-08-25 DIAGNOSIS — E785 Hyperlipidemia, unspecified: Secondary | ICD-10-CM

## 2014-08-25 DIAGNOSIS — E119 Type 2 diabetes mellitus without complications: Secondary | ICD-10-CM | POA: Diagnosis not present

## 2014-08-25 LAB — CBC WITH DIFFERENTIAL/PLATELET
Basophils Absolute: 0 10*3/uL (ref 0.0–0.1)
Basophils Relative: 0 % (ref 0–1)
EOS ABS: 0.1 10*3/uL (ref 0.0–0.7)
EOS PCT: 1 % (ref 0–5)
HCT: 33.9 % — ABNORMAL LOW (ref 36.0–46.0)
Hemoglobin: 11.4 g/dL — ABNORMAL LOW (ref 12.0–15.0)
Lymphocytes Relative: 33 % (ref 12–46)
Lymphs Abs: 2.2 10*3/uL (ref 0.7–4.0)
MCH: 24.6 pg — AB (ref 26.0–34.0)
MCHC: 33.6 g/dL (ref 30.0–36.0)
MCV: 73.2 fL — AB (ref 78.0–100.0)
MONO ABS: 0.5 10*3/uL (ref 0.1–1.0)
MPV: 8.9 fL (ref 8.6–12.4)
Monocytes Relative: 7 % (ref 3–12)
Neutro Abs: 4 10*3/uL (ref 1.7–7.7)
Neutrophils Relative %: 59 % (ref 43–77)
Platelets: 314 10*3/uL (ref 150–400)
RBC: 4.63 MIL/uL (ref 3.87–5.11)
RDW: 15.7 % — AB (ref 11.5–15.5)
WBC: 6.8 10*3/uL (ref 4.0–10.5)

## 2014-08-25 LAB — COMPREHENSIVE METABOLIC PANEL
ALBUMIN: 3.9 g/dL (ref 3.5–5.2)
ALK PHOS: 48 U/L (ref 39–117)
ALT: 15 U/L (ref 0–35)
AST: 16 U/L (ref 0–37)
BILIRUBIN TOTAL: 0.4 mg/dL (ref 0.2–1.2)
BUN: 10 mg/dL (ref 6–23)
CALCIUM: 9.3 mg/dL (ref 8.4–10.5)
CO2: 25 mEq/L (ref 19–32)
CREATININE: 0.65 mg/dL (ref 0.50–1.10)
Chloride: 103 mEq/L (ref 96–112)
Glucose, Bld: 113 mg/dL — ABNORMAL HIGH (ref 70–99)
Potassium: 3.6 mEq/L (ref 3.5–5.3)
Sodium: 138 mEq/L (ref 135–145)
TOTAL PROTEIN: 6.6 g/dL (ref 6.0–8.3)

## 2014-08-25 LAB — LIPID PANEL
Cholesterol: 156 mg/dL (ref 0–200)
HDL: 53 mg/dL (ref >=46)
LDL Cholesterol: 84 mg/dL (ref 0–99)
Total CHOL/HDL Ratio: 2.9 Ratio
Triglycerides: 96 mg/dL (ref <150)
VLDL: 19 mg/dL (ref 0–40)

## 2014-08-25 LAB — POCT GLYCOSYLATED HEMOGLOBIN (HGB A1C): HEMOGLOBIN A1C: 6.6

## 2014-08-25 LAB — TSH: TSH: 1.624 u[IU]/mL (ref 0.350–4.500)

## 2014-08-25 MED ORDER — HYDROCHLOROTHIAZIDE 25 MG PO TABS: ORAL_TABLET | ORAL | Status: DC

## 2014-08-25 MED ORDER — METFORMIN HCL 500 MG PO TABS: 500.0000 mg | ORAL_TABLET | Freq: Two times a day (BID) | ORAL | Status: DC

## 2014-08-25 MED ORDER — AMLODIPINE BESYLATE 5 MG PO TABS: 5.0000 mg | ORAL_TABLET | Freq: Every day | ORAL | Status: DC

## 2014-08-25 NOTE — Progress Notes (Signed)
Chief Complaint  Patient presents with  . Hypertension    fasting med check.   Patient presents for follow up on her blood pressure.  She has been checking BP at home and it has been running up to 135-168/86-100, usually running 140-150/100.  She was out of HCTZ for a couple of months, but found a week's worth in her pantry and has been back on it for just 4 days. She has been taking methydopa regularly. She has been compliant with a low sodium diet.  She had run out, and didn't call us for refills.  She recently changed insurance. She would have occasional headache, but no severe headaches.  She feels a little funny when her BP is high, and her eye will twitch.  Denies any chest pain. She reports that her BP's were <151 systolic when she was taking both HCTZ and the methyldopa. She feels drowsy from the methyldopa and prefers to change medication.  She is no longer trying for pregnancy, using condoms.  Diabetes:  She has been off medication for two years. She notes that her sugars have been running higher, and requests A1c today.  Her sugars have been running 113-175, mostly low 130's, occasional 150-170's after eating.  Has been eating more fresh fruits. She has gained over 10 pounds in the last year.  She previously took Metformin without side effects.  Asthma:  She had ER visit 2/20 with bronchitis, treated with steroids and tessalon. She also was seen in ER 11/22 with dyspnea/asthma flare.  She has nebulizer and inhaler, and hasn't needed any since these last illnesses.  She had to go to ER because she had been out of her nebulizer solution at the time.  She previously took a steroid inhaler, but not in a very long time, and has done well except related to illnesses.  OSA:  She admits to being noncompliant with her CPAP machine; hasn't used it in at least 3 months.  PMH, PSH and SH were reviewed and updated today.  Outpatient Encounter Prescriptions as of 08/25/2014  Medication Sig Note  .  docusate sodium (COLACE) 100 MG capsule Take 100 mg by mouth 2 (two) times daily.   . hydrochlorothiazide (HYDRODIURIL) 25 MG tablet Take 1 tablet every morning   . Multiple Vitamin (MULTIVITAMIN WITH MINERALS) TABS tablet Take 1 tablet by mouth daily. 08/25/2014: .   Marland Kitchen [DISCONTINUED] hydrochlorothiazide (HYDRODIURIL) 25 MG tablet Take 1/2-1 tablet every morning as directed (Patient taking differently: Take 25 mg by mouth daily. Take 1/2-1 tablet every morning as directed) 08/25/2014: Restarted 4 days ago after being off x 2 months  . [DISCONTINUED] methyldopa (ALDOMET) 500 MG tablet Take 1 tablet (500 mg total) by mouth 2 (two) times daily.   Marland Kitchen albuterol (PROVENTIL HFA;VENTOLIN HFA) 108 (90 BASE) MCG/ACT inhaler Inhale 2 puffs into the lungs every 4 (four) hours as needed for wheezing or shortness of breath.   Marland Kitchen albuterol (PROVENTIL) (2.5 MG/3ML) 0.083% nebulizer solution Take 3 mLs (2.5 mg total) by nebulization every 4 (four) hours as needed for wheezing or shortness of breath. (Patient not taking: Reported on 08/25/2014)   . amLODipine (NORVASC) 5 MG tablet Take 1 tablet (5 mg total) by mouth daily.   Marland Kitchen ibuprofen (ADVIL,MOTRIN) 800 MG tablet Take 0.5-1 tablets (400-800 mg total) by mouth every 8 (eight) hours as needed for headache, moderate pain or cramping. (Patient not taking: Reported on 08/25/2014)   . metFORMIN (GLUCOPHAGE) 500 MG tablet Take 1 tablet (500 mg total)  by mouth 2 (two) times daily with a meal.   . [DISCONTINUED] azithromycin (ZITHROMAX) 250 MG tablet Take 1 tablet (250 mg total) by mouth daily. Take first 2 tablets together, then 1 every day until finished. (Patient not taking: Reported on 07/16/2014)   . [DISCONTINUED] benzonatate (TESSALON) 100 MG capsule Take 1 capsule (100 mg total) by mouth every 8 (eight) hours.    (amlodipine and metformin started today, not prior to visit).  Allergies  Allergen Reactions  . Dilaudid [Hydromorphone Hcl] Hives  . Morphine And Related Hives   . Peanut-Containing Drug Products Hives  . Strawberry Swelling    Swelling is of the eye.   ROS:  No fevers, chills, URI symptoms, cough, shortness of breath, nausea, vomiting, diarrhea, edema, bleeding, bruising, rash, chest pain, palpitations, urinary complaints or other concerns except as noted in HPI  PHYSICAL EXAM: BP 158/108 mmHg  Pulse 72  Ht 5\' 4"  (1.626 m)  Wt 297 lb 12.8 oz (135.081 kg)  BMI 51.09 kg/m2  LMP 08/08/2014  Breastfeeding? No  174/110 on repeat by MD with large cuff, right arm (in a room with her screaming children) Well developed, pleasant, obese female in no distress.  A bit rattled/stressed by having her children in the room. HEENT: PERRL ,EOMI, conjunctiva clear.  OP clear Neck: No lymphadenopathy or mass Heart: regular rate and rhythm Lungs: clear bilaterally without wheeze, rales, ronchi Abdomen: obese, soft, nontender, no mass Extremities: no edema Skin: no rash Psych: normal mood, affect, hygiene and grooming Neuro: alert and oriented. Normal strength, gait.  PFT's: normal--see report.  ASSESSMENT/PLAN:  Type 2 diabetes mellitus without complication - rising sugars; +weight gain. start metformin. encouraged healthy diet, regular exercise, weight loss - Plan: Comprehensive metabolic panel, TSH, Microalbumin / creatinine urine ratio, metFORMIN (GLUCOPHAGE) 500 MG tablet  Diabetes mellitus without complication - Plan: HgB A1c  Asthma, mild intermittent, uncomplicated - normal PFT's.  consider preventative medication if any increase in albuterol use. - Plan: Spirometry with Graph, Spirometry with Graph  Essential hypertension, benign - uncontrolled, partly related to noncompliance. some side effects.  Change to amlodipine, continue HCTZ, stop methyldopa. f/u 1 month. raise dose if BP remains v - Plan: Comprehensive metabolic panel, amLODipine (NORVASC) 5 MG tablet, hydrochlorothiazide (HYDRODIURIL) 25 MG tablet  OSA (obstructive sleep apnea) -  encouraged her to use CPAP daily.  risks of untreated OSA were reviewed  Vitamin D deficiency - Plan: Vit D  25 hydroxy (rtn osteoporosis monitoring)  Obesity, morbid, BMI 40.0-49.9  Hyperlipidemia - Plan: Lipid panel  Medication monitoring encounter - Plan: Comprehensive metabolic panel, Vit D  25 hydroxy (rtn osteoporosis monitoring), CBC with Differential/Platelet   Hypertension--uncontrolled, partly due to noncompliance with HCTZ.  Not liking side effects of methyldopa.  Change to amlodipine and continue HCTZ. Return in 1 month for f/u on BP. Start at 5mg . If after 2-3 week 9and being back on the HCTZ it is still >140, then will need to increase the dose.  DM--restart metformin   F/u 1 month

## 2014-08-25 NOTE — Patient Instructions (Signed)
Start metformin twice daily for diabetes.  It is important that you lose weight.  Daily exercise also helps keep sugar down (even if not losing weight from it). Cut back on your carbs, sugared drinks (sweet tea, soda, juices) and portion sizes.  Stop the aldomet, and change to amlodipine as we discussed (use aldomet once daily for 2 days then stop; if your BP drops too low being on both meds, then cut the amlodipine in 1/2 just until you stop the aldomet completely). Check your blood pressure regularly, keep a list and bring the list to your appointment.  Check your blood sugars, and write them down (keeping columns for fasting and 2 hours after eating (or at bedtime); bring this list to your next visit.  It is very important that you use your CPAP, as there are serious consequences of untreated sleep apnea, as we discussed.

## 2014-08-26 LAB — MICROALBUMIN / CREATININE URINE RATIO
CREATININE, URINE: 195.5 mg/dL
MICROALB UR: 5.6 mg/dL — AB (ref <2.0)
Microalb Creat Ratio: 28.6 mg/g (ref 0.0–30.0)

## 2014-08-26 LAB — FERRITIN: Ferritin: 20 ng/mL (ref 10–291)

## 2014-08-26 LAB — VITAMIN D 25 HYDROXY (VIT D DEFICIENCY, FRACTURES): VIT D 25 HYDROXY: 22 ng/mL — AB (ref 30–100)

## 2014-08-26 LAB — IRON: Iron: 55 ug/dL (ref 42–145)

## 2014-08-26 MED ORDER — VITAMIN D (ERGOCALCIFEROL) 1.25 MG (50000 UNIT) PO CAPS: 50000.0000 [IU] | ORAL_CAPSULE | ORAL | Status: DC

## 2014-08-26 NOTE — Addendum Note (Signed)
Addended by: Rita Ohara on: 08/26/2014 07:35 AM   Modules accepted: Orders

## 2014-09-29 ENCOUNTER — Telehealth: Payer: Self-pay | Admitting: *Deleted

## 2014-09-29 ENCOUNTER — Ambulatory Visit: Payer: 59 | Admitting: Family Medicine

## 2014-09-29 NOTE — Telephone Encounter (Signed)
This is her second recent no show, last was in October and she was sent a letter. Follow up IS necessary, as her blood pressure was out of control, and med changes were made. She needs to be assessed a no-show charge. If she no shows again, then she will be dismissed

## 2014-09-29 NOTE — Telephone Encounter (Signed)
This patient no showed for their appointment today.Which of the following is necessary for this patient.   A) No follow-up necessary   B) Follow-up urgent. Locate Patient Immediately.   C) Follow-up necessary. Contact patient and Schedule visit in ____ Days.   D) Follow-up Advised. Contact patient and Schedule visit in ____ Days.  Per Gabriel Cirri, patient called at 1:32pm to state that she could not make it to her 1:30pm appt as she had to go to work.

## 2014-09-29 NOTE — Telephone Encounter (Signed)
Forwarding

## 2014-10-06 ENCOUNTER — Ambulatory Visit: Payer: 59 | Admitting: Family Medicine

## 2014-10-07 ENCOUNTER — Encounter: Payer: Self-pay | Admitting: Family Medicine

## 2014-10-10 ENCOUNTER — Ambulatory Visit: Payer: 59 | Admitting: Family Medicine

## 2014-10-12 ENCOUNTER — Encounter: Payer: Self-pay | Admitting: Family Medicine

## 2014-10-12 ENCOUNTER — Ambulatory Visit (INDEPENDENT_AMBULATORY_CARE_PROVIDER_SITE_OTHER): Payer: 59 | Admitting: Family Medicine

## 2014-10-12 VITALS — BP 138/90 | HR 68 | Ht 64.0 in | Wt 302.2 lb

## 2014-10-12 DIAGNOSIS — Z6841 Body Mass Index (BMI) 40.0 and over, adult: Secondary | ICD-10-CM

## 2014-10-12 DIAGNOSIS — E559 Vitamin D deficiency, unspecified: Secondary | ICD-10-CM | POA: Diagnosis not present

## 2014-10-12 DIAGNOSIS — I10 Essential (primary) hypertension: Secondary | ICD-10-CM | POA: Diagnosis not present

## 2014-10-12 DIAGNOSIS — E119 Type 2 diabetes mellitus without complications: Secondary | ICD-10-CM

## 2014-10-12 DIAGNOSIS — G4733 Obstructive sleep apnea (adult) (pediatric): Secondary | ICD-10-CM | POA: Diagnosis not present

## 2014-10-12 MED ORDER — AMLODIPINE BESYLATE 5 MG PO TABS: 5.0000 mg | ORAL_TABLET | Freq: Every day | ORAL | Status: DC

## 2014-10-12 NOTE — Patient Instructions (Signed)
Continue the same medications. Work on trying to not skip meals, eat small, healthy meals throughout the day/night. We are referring you to dietician. Please pick up the prescription Vitamin D from your pharmacy and take for 12 weeks. Start OTC 1000 IU every day, in addition to your vitamin, once you finish the prescription vitamin D.

## 2014-10-12 NOTE — Progress Notes (Signed)
Chief Complaint  Patient presents with  . Follow-up    4 week follow up on bp and blood sugars. Did make list but lost it-she does remember numbers though. Did not know about rx vit D ( thinks she did not get MyChart message).   Hypertension follow-up:  The aldomet was stopped, changed to amlodipine.  She was continued on the HCTZ.  Denies side effects. No headaches, or dizziness.  BP's are improved, running 126-142/76-92.  OSA: She restarted sleeping with her CPAP regularly.  When she forgot it one day, she was groggier the next day, and had headache.  Diabetes:  Metformin was restarted at her last visit due to increasing blood sugars and A1c of 6.6.  She is taking 500mg  BID without side effects (had some diarrhea just initially).  Fasting sugars are up to 130, usually 115.  2 hours after eating they are in the 150-170's, only once over 200 (after having syrup).  She has never seen dietician, and doesn't feel like she knows what she should be eating. She has h/o GDM with 2 pregnancies, with diabetes resolving and able to get off meds, until recently restarted.  She admits to skipping meals, snacks on chips at work.  She works 3rd shift. She has sweet tea once or twice a week, cut back on sodas, juices.  Drinking more water since last visit.  Vitamin D deficiency--she didn't see the MyChart message advising her of the prescription that was sent, so hasn't started that yet.  PMH, PSH, SH reviewed.  Outpatient Encounter Prescriptions as of 10/12/2014  Medication Sig  . amLODipine (NORVASC) 5 MG tablet Take 1 tablet (5 mg total) by mouth daily.  . hydrochlorothiazide (HYDRODIURIL) 25 MG tablet Take 1 tablet every morning  . metFORMIN (GLUCOPHAGE) 500 MG tablet Take 1 tablet (500 mg total) by mouth 2 (two) times daily with a meal.  . Multiple Vitamin (MULTIVITAMIN WITH MINERALS) TABS tablet Take 1 tablet by mouth daily.  . [DISCONTINUED] amLODipine (NORVASC) 5 MG tablet Take 1 tablet (5 mg total)  by mouth daily.  Marland Kitchen albuterol (PROVENTIL HFA;VENTOLIN HFA) 108 (90 BASE) MCG/ACT inhaler Inhale 2 puffs into the lungs every 4 (four) hours as needed for wheezing or shortness of breath.  Marland Kitchen albuterol (PROVENTIL) (2.5 MG/3ML) 0.083% nebulizer solution Take 3 mLs (2.5 mg total) by nebulization every 4 (four) hours as needed for wheezing or shortness of breath. (Patient not taking: Reported on 08/25/2014)  . docusate sodium (COLACE) 100 MG capsule Take 100 mg by mouth 2 (two) times daily.  Marland Kitchen ibuprofen (ADVIL,MOTRIN) 800 MG tablet Take 0.5-1 tablets (400-800 mg total) by mouth every 8 (eight) hours as needed for headache, moderate pain or cramping. (Patient not taking: Reported on 08/25/2014)  . Vitamin D, Ergocalciferol, (DRISDOL) 50000 UNITS CAPS capsule Take 1 capsule (50,000 Units total) by mouth every 7 (seven) days. (Patient not taking: Reported on 10/12/2014)   No facility-administered encounter medications on file as of 10/12/2014.   Allergies  Allergen Reactions  . Dilaudid [Hydromorphone Hcl] Hives  . Morphine And Related Hives  . Peanut-Containing Drug Products Hives  . Strawberry Swelling    Swelling is of the eye.   ROS: no fevers, chills, headaches, dizziness, chest pain, palpitations, nausea, vomiting, diarrhea (resolved), URI symptoms, cough, shortness of breath.  No urinary complaints, vaginal discharge, bleeding, bruising, numbness, tingling, depression or any other complaints. Weight gain since last visit (4-5#)  PHYSICAL EXAM: BP 138/90 mmHg  Pulse 68  Ht 5\' 4"  (  1.626 m)  Wt 302 lb 3.2 oz (137.077 kg)  BMI 51.85 kg/m2  LMP 09/23/2014  Breastfeeding? No 154/90 on repeat by MD, R arm  Morbidly obese female, in no distress HEENT: PERRL, EOMI, conjunctiva clear, OP cleaer Neck: No lymphadenopathy, thyromegaly or bruit Heart: regular rate and rhythm without murmur Lungs: clear bilaterally Abdomen: soft, nontender, no mass Back: no CVA tenderness Extremities: no edema Skin:  no rashes/lesions Neuro: normal cranial nerves, strength, sensation, gait Psych: normal mood, affect, hygiene and grooming  Lab Results  Component Value Date   HGBA1C 6.6 08/25/2014   Lab Results  Component Value Date   CHOL 156 08/25/2014   HDL 53 08/25/2014   LDLCALC 84 08/25/2014   TRIG 96 08/25/2014   CHOLHDL 2.9 08/25/2014     Chemistry      Component Value Date/Time   NA 138 08/25/2014 0001   K 3.6 08/25/2014 0001   CL 103 08/25/2014 0001   CO2 25 08/25/2014 0001   BUN 10 08/25/2014 0001   CREATININE 0.65 08/25/2014 0001   CREATININE 0.70 04/17/2014 2050      Component Value Date/Time   CALCIUM 9.3 08/25/2014 0001   ALKPHOS 48 08/25/2014 0001   AST 16 08/25/2014 0001   ALT 15 08/25/2014 0001   BILITOT 0.4 08/25/2014 0001     Glucose 113 Vitamin D-OH 22  Lab Results  Component Value Date   TSH 1.624 08/25/2014   Urine microalb/Cr ratio was 28.6  Lab Results  Component Value Date   WBC 6.8 08/25/2014   HGB 11.4* 08/25/2014   HCT 33.9* 08/25/2014   MCV 73.2* 08/25/2014   PLT 314 08/25/2014   Lab Results  Component Value Date   IRON 55 08/25/2014   FERRITIN 20 08/25/2014   ASSESSMENT/PLAN:  Essential hypertension, benign - improved control--elevated at visit, but improved at home.  continue current regimen - Plan: amLODipine (NORVASC) 5 MG tablet, Amb ref to Medical Nutrition Therapy-[facility]  Type 2 diabetes mellitus without complication - continue metformin.  refer to nutritionist.  Educated/counseled re: diet, exercise - Plan: Amb ref to Medical Nutrition Therapy-[facility]  OSA (obstructive sleep apnea) - encouraged continued compliance with CPAP  Morbid obesity with BMI of 50.0-59.9, adult - Plan: Amb ref to Medical Nutrition Therapy-[facility]  Vitamin D deficiency - start weekly rx x 12 weeks; add OTC 1000 IU daily after completion   Fill the prescription vitamin D, take it weekly for 12 weeks.  Once you finish the 3 months of prescription, start taking an  additional 1000 IU of OTC Vitamin D3 every day, in addition to the multivitamin that you take.  F/u 3 months. A1c at visit.  25 min visit, more than 1/2 spent counseling

## 2014-11-21 ENCOUNTER — Other Ambulatory Visit: Payer: Self-pay

## 2014-12-06 ENCOUNTER — Ambulatory Visit: Payer: 59 | Admitting: *Deleted

## 2014-12-07 ENCOUNTER — Telehealth: Payer: Self-pay | Admitting: *Deleted

## 2014-12-07 NOTE — Telephone Encounter (Signed)
Attempted several time to get a hold of patient. No voicemail.

## 2014-12-16 ENCOUNTER — Encounter (HOSPITAL_COMMUNITY): Payer: Self-pay | Admitting: Emergency Medicine

## 2014-12-16 ENCOUNTER — Emergency Department (HOSPITAL_COMMUNITY)
Admission: EM | Admit: 2014-12-16 | Discharge: 2014-12-16 | Disposition: A | Discharge status: Home / Self Care | Payer: 59 | Source: Home / Self Care | Attending: Family Medicine | Admitting: Family Medicine

## 2014-12-16 DIAGNOSIS — K088 Other specified disorders of teeth and supporting structures: Secondary | ICD-10-CM

## 2014-12-16 DIAGNOSIS — J301 Allergic rhinitis due to pollen: Secondary | ICD-10-CM | POA: Diagnosis not present

## 2014-12-16 DIAGNOSIS — K029 Dental caries, unspecified: Secondary | ICD-10-CM | POA: Diagnosis not present

## 2014-12-16 DIAGNOSIS — K0510 Chronic gingivitis, plaque induced: Secondary | ICD-10-CM | POA: Diagnosis not present

## 2014-12-16 DIAGNOSIS — K0889 Other specified disorders of teeth and supporting structures: Secondary | ICD-10-CM

## 2014-12-16 MED ORDER — HYDROCODONE-ACETAMINOPHEN 7.5-325 MG PO TABS: 1.0000 | ORAL_TABLET | ORAL | Status: DC | PRN

## 2014-12-16 MED ORDER — AMOXICILLIN 500 MG PO CAPS: 1000.0000 mg | ORAL_CAPSULE | Freq: Two times a day (BID) | ORAL | Status: DC

## 2014-12-16 NOTE — Discharge Instructions (Signed)
Allergic Rhinitis May take Allegra or Zyrtec or Claritin for allergies and drainage Do not recommend decongestion since her blood pressure is elevated Flonase nasal spray as directed Drink plenty of fluids stay well-hydrated Allergic rhinitis is when the mucous membranes in the nose respond to allergens. Allergens are particles in the air that cause your body to have an allergic reaction. This causes you to release allergic antibodies. Through a chain of events, these eventually cause you to release histamine into the blood stream. Although meant to protect the body, it is this release of histamine that causes your discomfort, such as frequent sneezing, congestion, and an itchy, runny nose.  CAUSES  Seasonal allergic rhinitis (hay fever) is caused by pollen allergens that may come from grasses, trees, and weeds. Year-round allergic rhinitis (perennial allergic rhinitis) is caused by allergens such as house dust mites, pet dander, and mold spores.  SYMPTOMS   Nasal stuffiness (congestion).  Itchy, runny nose with sneezing and tearing of the eyes. DIAGNOSIS  Your health care provider can help you determine the allergen or allergens that trigger your symptoms. If you and your health care provider are unable to determine the allergen, skin or blood testing may be used. TREATMENT  Allergic rhinitis does not have a cure, but it can be controlled by:  Medicines and allergy shots (immunotherapy).  Avoiding the allergen. Hay fever may often be treated with antihistamines in pill or nasal spray forms. Antihistamines block the effects of histamine. There are over-the-counter medicines that may help with nasal congestion and swelling around the eyes. Check with your health care provider before taking or giving this medicine.  If avoiding the allergen or the medicine prescribed do not work, there are many new medicines your health care provider can prescribe. Stronger medicine may be used if initial  measures are ineffective. Desensitizing injections can be used if medicine and avoidance does not work. Desensitization is when a patient is given ongoing shots until the body becomes less sensitive to the allergen. Make sure you follow up with your health care provider if problems continue. HOME CARE INSTRUCTIONS It is not possible to completely avoid allergens, but you can reduce your symptoms by taking steps to limit your exposure to them. It helps to know exactly what you are allergic to so that you can avoid your specific triggers. SEEK MEDICAL CARE IF:   You have a fever.  You develop a cough that does not stop easily (persistent).  You have shortness of breath.  You start wheezing.  Symptoms interfere with normal daily activities. Document Released: 02/05/2001 Document Revised: 05/18/2013 Document Reviewed: 01/18/2013 [facility] Patient Information 2015 [place], [place]. This information is not intended to replace advice given to you by your health care provider. Make sure you discuss any questions you have with your health care provider.  Dental Pain A tooth ache may be caused by cavities (tooth decay). Cavities expose the nerve of the tooth to air and hot or cold temperatures. It may come from an infection or abscess (also called a boil or furuncle) around your tooth. It is also often caused by dental caries (tooth decay). This causes the pain you are having. DIAGNOSIS  Your caregiver can diagnose this problem by exam. TREATMENT   If caused by an infection, it may be treated with medications which kill germs (antibiotics) and pain medications as prescribed by your caregiver. Take medications as directed.  Only take over-the-counter or prescription medicines for pain, discomfort, or fever as directed by your  caregiver.  Whether the tooth ache today is caused by infection or dental disease, you should see your dentist as soon as possible for further care. SEEK MEDICAL CARE IF: The  exam and treatment you received today has been provided on an emergency basis only. This is not a substitute for complete medical or dental care. If your problem worsens or new problems (symptoms) appear, and you are unable to meet with your dentist, call or return to this location. SEEK IMMEDIATE MEDICAL CARE IF:   You have a fever.  You develop redness and swelling of your face, jaw, or neck.  You are unable to open your mouth.  You have severe pain uncontrolled by pain medicine. MAKE SURE YOU:   Understand these instructions.  Will watch your condition.  Will get help right away if you are not doing well or get worse. Document Released: 05/13/2005 Document Revised: 08/05/2011 Document Reviewed: 12/30/2007 [facility] Patient Information 2015 [place], [place]. This information is not intended to replace advice given to you by your health care provider. Make sure you discuss any questions you have with your health care provider.  Dental Caries Dental caries (also called tooth decay) is the most common oral disease. It can occur at any age but is more common in children and young adults.  HOW DENTAL CARIES DEVELOPS  The process of decay begins when bacteria and foods (particularly sugars and starches) combine in your mouth to produce plaque. Plaque is a substance that sticks to the hard, outer surface of a tooth (enamel). The bacteria in plaque produce acids that attack enamel. These acids may also attack the root surface of a tooth (cementum) if it is exposed. Repeated attacks dissolve these surfaces and create holes in the tooth (cavities). If left untreated, the acids destroy the other layers of the tooth.  RISK FACTORS  Frequent sipping of sugary beverages.   Frequent snacking on sugary and starchy foods, especially those that easily get stuck in the teeth.   Poor oral hygiene.   Dry mouth.   Substance abuse such as methamphetamine abuse.   Broken or poor-fitting dental  restorations.   Eating disorders.   Gastroesophageal reflux disease (GERD).   Certain radiation treatments to the head and neck. SYMPTOMS In the early stages of dental caries, symptoms are seldom present. Sometimes white, chalky areas may be seen on the enamel or other tooth layers. In later stages, symptoms may include:  Pits and holes on the enamel.  Toothache after sweet, hot, or cold foods or drinks are consumed.  Pain around the tooth.  Swelling around the tooth. DIAGNOSIS  Most of the time, dental caries is detected during a regular dental checkup. A diagnosis is made after a thorough medical and dental history is taken and the surfaces of your teeth are checked for signs of dental caries. Sometimes special instruments, such as lasers, are used to check for dental caries. Dental X-ray exams may be taken so that areas not visible to the eye (such as between the contact areas of the teeth) can be checked for cavities.  TREATMENT  If dental caries is in its early stages, it may be reversed with a fluoride treatment or an application of a remineralizing agent at the dental office. Thorough brushing and flossing at home is needed to aid these treatments. If it is in its later stages, treatment depends on the location and extent of tooth destruction:   If a small area of the tooth has been destroyed, the  destroyed area will be removed and cavities will be filled with a material such as gold, silver amalgam, or composite resin.   If a large area of the tooth has been destroyed, the destroyed area will be removed and a cap (crown) will be fitted over the remaining tooth structure.   If the center part of the tooth (pulp) is affected, a procedure called a root canal will be needed before a filling or crown can be placed.   If most of the tooth has been destroyed, the tooth may need to be pulled (extracted). HOME CARE INSTRUCTIONS You can prevent, stop, or reverse dental caries at  home by practicing good oral hygiene. Good oral hygiene includes:  Thoroughly cleaning your teeth at least twice a day with a toothbrush and dental floss.   Using a fluoride toothpaste. A fluoride mouth rinse may also be used if recommended by your dentist or health care provider.   Restricting the amount of sugary and starchy foods and sugary liquids you consume.   Avoiding frequent snacking on these foods and sipping of these liquids.   Keeping regular visits with a dentist for checkups and cleanings. PREVENTION   Practice good oral hygiene.  Consider a dental sealant. A dental sealant is a coating material that is applied by your dentist to the pits and grooves of teeth. The sealant prevents food from being trapped in them. It may protect the teeth for several years.  Ask about fluoride supplements if you live in a community without fluorinated water or with water that has a low fluoride content. Use fluoride supplements as directed by your dentist or health care provider.  Allow fluoride varnish applications to teeth if directed by your dentist or health care provider. Document Released: 02/02/2002 Document Revised: 09/27/2013 Document Reviewed: 05/15/2012 [facility] Patient Information 2015 [place], [place]. This information is not intended to replace advice given to you by your health care provider. Make sure you discuss any questions you have with your health care provider.  Dental Care and Dentist Visits Dental care supports good overall health. Regular dental visits can also help you avoid dental pain, bleeding, infection, and other more serious health problems in the future. It is important to keep the mouth healthy because diseases in the teeth, gums, and other oral tissues can spread to other areas of the body. Some problems, such as diabetes, heart disease, and pre-term labor have been associated with poor oral health.  See your dentist every 6 months. If you experience  emergency problems such as a toothache or broken tooth, go to the dentist right away. If you see your dentist regularly, you may catch problems early. It is easier to be treated for problems in the early stages.  WHAT TO EXPECT AT A DENTIST VISIT  Your dentist will look for many common oral health problems and recommend proper treatment. At your regular dental visit, you can expect:  Gentle cleaning of the teeth and gums. This includes scraping and polishing. This helps to remove the sticky substance around the teeth and gums (plaque). Plaque forms in the mouth shortly after eating. Over time, plaque hardens on the teeth as tartar. If tartar is not removed regularly, it can cause problems. Cleaning also helps remove stains.  Periodic X-rays. These pictures of the teeth and supporting bone will help your dentist assess the health of your teeth.  Periodic fluoride treatments. Fluoride is a natural mineral shown to help strengthen teeth. Fluoride treatmentinvolves applying a fluoride gel or  varnish to the teeth. It is most commonly done in children.  Examination of the mouth, tongue, jaws, teeth, and gums to look for any oral health problems, such as:  Cavities (dental caries). This is decay on the tooth caused by plaque, sugar, and acid in the mouth. It is best to catch a cavity when it is small.  Inflammation of the gums caused by plaque buildup (gingivitis).  Problems with the mouth or malformed or misaligned teeth.  Oral cancer or other diseases of the soft tissues or jaws. KEEP YOUR TEETH AND GUMS HEALTHY For healthy teeth and gums, follow these general guidelines as well as your dentist's specific advice:  Have your teeth professionally cleaned at the dentist every 6 months.  Brush twice daily with a fluoride toothpaste.  Floss your teeth daily.  Ask your dentist if you need fluoride supplements, treatments, or fluoride toothpaste.  Eat a healthy diet. Reduce foods and drinks with  added sugar.  Avoid smoking. TREATMENT FOR ORAL HEALTH PROBLEMS If you have oral health problems, treatment varies depending on the conditions present in your teeth and gums.  Your caregiver will most likely recommend good oral hygiene at each visit.  For cavities, gingivitis, or other oral health disease, your caregiver will perform a procedure to treat the problem. This is typically done at a separate appointment. Sometimes your caregiver will refer you to another dental specialist for specific tooth problems or for surgery. SEEK IMMEDIATE DENTAL CARE IF:  You have pain, bleeding, or soreness in the gum, tooth, jaw, or mouth area.  A permanent tooth becomes loose or separated from the gum socket.  You experience a blow or injury to the mouth or jaw area. Document Released: 01/23/2011 Document Revised: 08/05/2011 Document Reviewed: 01/23/2011 [facility] Patient Information 2015 [place], [place]. This information is not intended to replace advice given to you by your health care provider. Make sure you discuss any questions you have with your health care provider.

## 2014-12-16 NOTE — ED Notes (Signed)
Top, left and bottom right teeth are hurting.  Patient is complaining of uri, runny nose.
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
## 2015-01-18 ENCOUNTER — Encounter: Payer: Self-pay | Admitting: Family Medicine

## 2015-01-18 ENCOUNTER — Ambulatory Visit (INDEPENDENT_AMBULATORY_CARE_PROVIDER_SITE_OTHER): Payer: 59 | Admitting: Family Medicine

## 2015-01-18 VITALS — BP 136/82 | HR 76 | Temp 97.7°F | Ht 64.0 in | Wt 298.4 lb

## 2015-01-18 DIAGNOSIS — Z23 Encounter for immunization: Secondary | ICD-10-CM | POA: Diagnosis not present

## 2015-01-18 DIAGNOSIS — L02212 Cutaneous abscess of back [any part, except buttock]: Secondary | ICD-10-CM

## 2015-01-18 MED ORDER — DOXYCYCLINE HYCLATE 100 MG PO TABS: 100.0000 mg | ORAL_TABLET | Freq: Two times a day (BID) | ORAL | Status: DC

## 2015-01-18 NOTE — Progress Notes (Signed)
Chief Complaint  Patient presents with  . Cyst    mid back around bra area and is painful and draining, unsure of how long it has been there.    She noticed discomfort in the middle of her back last week, like she needed to stretch it or pop it.  4-5 days ago when she went to scratch her back she felt a bump.  Her husband squeezed something out "that looked like a stinger"--it hurt worse after that, and then started draining on its own, seems to be clear when she wipes it.  Hasn't been using a bandage.  No fevers or chills.  Pain hasn't gotten any worse, unsure if the size has changed. No known bug bite, tick, travel or other similar lesions.  PMH, PSH, SH reviewed. Contraception--using condoms.   Current Outpatient Prescriptions on File Prior to Visit  Medication Sig Dispense Refill  . amLODipine (NORVASC) 5 MG tablet Take 1 tablet (5 mg total) by mouth daily. 90 tablet 1  . hydrochlorothiazide (HYDRODIURIL) 25 MG tablet Take 1 tablet every morning 30 tablet 5  . metFORMIN (GLUCOPHAGE) 500 MG tablet Take 1 tablet (500 mg total) by mouth 2 (two) times daily with a meal. 180 tablet 0  . Multiple Vitamin (MULTIVITAMIN WITH MINERALS) TABS tablet Take 1 tablet by mouth daily.     No current facility-administered medications on file prior to visit.   Allergies  Allergen Reactions  . Dilaudid [Hydromorphone Hcl] Hives  . Morphine And Related Hives  . Peanut-Containing Drug Products Hives  . Strawberry Swelling    Swelling is of the eye.   ROS: no fever chills, nausea, vomiting, headache, dizziness, chest pain, shortness of breath, cough, URI symptoms or other concerns.  PHYSICAL EXAM: BP 136/82 mmHg  Pulse 76  Temp(Src) 97.7 F (36.5 C) (Tympanic)  Ht 5\' 4"  (1.626 m)  Wt 298 lb 6.4 oz (135.353 kg)  BMI 51.19 kg/m2  LMP 01/09/2015  Breastfeeding? No  Well appearing pleasant female in no distress Back: Just below the lower portion of her sports bra, in mid-thoracic area, midline  there is a very small firm/indurated area that is tender to palpation.  There is no obvious deformity or mass.  Just above this tender area, on the left, there is a very small superficial abrasion/opening that is minimally tender, and likely the source of the clear drainage. No erythema or warmth.    ASSESSMENT/PLAN:  Cutaneous abscess of back excluding buttocks - Plan: doxycycline (VIBRA-TABS) 100 MG tablet  Need for prophylactic vaccination and inoculation against influenza - Plan: Flu Vaccine QUAD 36+ mos PF IM (Fluarix & Fluzone Quad PF)   I suspect there is a small/early sebaceous cyst that is causing the discomfort.  We are going to treat for possible early infection with an antibiotic.  But using warm compresses to the area at least 3 times per day will also help.  It is possible that this area will grow in size, get more painful, and perhaps need drainage.  I think this is very early, and likely the antibiotics should take care of it.  Return if increasing size, pain, fever, redness or other concerns develop.   Flu shot given Plans to schedule med check

## 2015-01-18 NOTE — Patient Instructions (Signed)
Take the antibiotics as directed. Apply warm compresses to the area for at least 15 minutes three times daily, if possible.  I suspect there is a small/early sebaceous cyst that is causing the discomfort.  We are going to treat for possible early infection with an antibiotic.  But using warm compresses to the area at least 3 times per day will also help.  It is possible that this area will grow in size, get more painful, and perhaps need drainage.  I think this is very early, and likely the antibiotics should take care of it.  Return if increasing size, pain, fever, redness or other concerns develop.

## 2015-01-31 ENCOUNTER — Telehealth: Payer: Self-pay | Admitting: Family Medicine

## 2015-01-31 DIAGNOSIS — E119 Type 2 diabetes mellitus without complications: Secondary | ICD-10-CM

## 2015-01-31 MED ORDER — METFORMIN HCL 500 MG PO TABS: 500.0000 mg | ORAL_TABLET | Freq: Two times a day (BID) | ORAL | Status: DC

## 2015-01-31 NOTE — Telephone Encounter (Signed)
She has OV 9/14.  Refill sent

## 2015-01-31 NOTE — Telephone Encounter (Signed)
Pt called requesting refill on metformin. Please send to [facility].

## 2015-02-08 ENCOUNTER — Institutional Professional Consult (permissible substitution): Payer: 59 | Admitting: Family Medicine

## 2015-02-09 ENCOUNTER — Telehealth: Payer: Self-pay | Admitting: *Deleted

## 2015-02-09 ENCOUNTER — Other Ambulatory Visit: Payer: Self-pay | Admitting: *Deleted

## 2015-02-09 ENCOUNTER — Ambulatory Visit (INDEPENDENT_AMBULATORY_CARE_PROVIDER_SITE_OTHER): Payer: 59 | Admitting: Family Medicine

## 2015-02-09 ENCOUNTER — Encounter: Payer: Self-pay | Admitting: Family Medicine

## 2015-02-09 VITALS — BP 120/70 | HR 76 | Ht 64.0 in | Wt 301.0 lb

## 2015-02-09 DIAGNOSIS — I10 Essential (primary) hypertension: Secondary | ICD-10-CM

## 2015-02-09 DIAGNOSIS — E559 Vitamin D deficiency, unspecified: Secondary | ICD-10-CM

## 2015-02-09 DIAGNOSIS — G4733 Obstructive sleep apnea (adult) (pediatric): Secondary | ICD-10-CM | POA: Diagnosis not present

## 2015-02-09 DIAGNOSIS — E119 Type 2 diabetes mellitus without complications: Secondary | ICD-10-CM

## 2015-02-09 DIAGNOSIS — Z6841 Body Mass Index (BMI) 40.0 and over, adult: Secondary | ICD-10-CM

## 2015-02-09 LAB — POCT GLYCOSYLATED HEMOGLOBIN (HGB A1C): Hemoglobin A1C: 6.8

## 2015-02-09 MED ORDER — METFORMIN HCL ER 500 MG PO TB24: 1000.0000 mg | ORAL_TABLET | Freq: Every day | ORAL | Status: DC

## 2015-02-09 NOTE — Telephone Encounter (Signed)
Patient advised and verbalized understanding 

## 2015-02-09 NOTE — Telephone Encounter (Signed)
Patient called and had some sushi (shrimp) from a different place than she usually goes. She has never had any reaction to this before. Ate about 2-3 hrs ago and shortly after her forehead began to break out with little bumps and was itchy, also her eyes are itching her. She had some Claritin, so she took that. She also has benadryl handy if needed. Please advise what to do. Took the Claritin while I was on the phone with her.

## 2015-02-09 NOTE — Telephone Encounter (Signed)
Now that it has been 30 mins--if she has noticed any progression/worsening of allergic reaction, have her go ahead and take the benadryl as well.  If she has any swelling of mouth/tongue/throat or shortness of breath, have someone drive her to the ER.

## 2015-02-09 NOTE — Patient Instructions (Addendum)
Please call [facility] to reschedule your nutrition/dietician visit.  Diabetes education is an important component to understanding the illness and getting good control through diet and execise.  We are changing the metformin to extended release.  Please verify with the pharmacy that you are getting the correct metformin, as we sent in a refill of the regular metformin on 9/6 (that you haven't picked up yet)--it might be sitting on the shelf and they may give this to you in error.  Continue daily metformin.  Try and work hard to get regular, daily exercise, as well as continue efforts at weight loss and healthy diet.  Not sure exactly how much prescription vitamin D you took, but it sounds like it is less than the intended 12 weeks.  See if the pharmacy has a refill, and if so, take it.  If not, just increase the over-the-counter dose to 2000 IU every day, and eventually the level should come up.  We will recheck it at your next visit.

## 2015-02-09 NOTE — Progress Notes (Signed)
Chief Complaint  Patient presents with  . Advice Only    has form for DMV to be filled out. This was done in 2014 (I printed out last one). Also will do A1c today-had flu shot at last visit.    Patient presents to have DMV forms filled out--relevant sections include pulmonary (for sleep apnea) and diabetes.  Diabetes: She started Metformin in May. She was taking it twice daily--had some occasional diarrhea, intermittent and tolerable (mostly after restarting it after she forgets a pill, usually once or twice a week).  Sometimes has trouble remembering the evening dose. Fasting blood sugars had been up to 113, and 2 hours postprandial were 130's-140's.  She ran out of medication 2 weeks ago, had issues getting it from the pharmacy. Refill was sent in on 9/6, but pharmacy didn't have it the last time she went to get it filled (when she got her other medications).  She no showed her nutritionist visit. Didn't call to reschedule because she lost the phone number.  Vitamin D deficiency:  Last check was 3/31 and level was 22.  She took rx starting in May--she doesn't believe there were 12 pills in the bottle, only took 1 bottle (didn't get refills), and has been taking 1000 IU daily since completing the prescription. She feels less tired.  OSA:  She is using her CPAP nightly.  She can tell the difference if she forgets to use it after getting off of work.  She gets a headache if she doesn't use it.  She denies have any daytime somnolence, and she feels refreshed in the mornings.  Need a compliance report to attach to her DMV forms (none received from [facility]).  Hypertension follow-up:  Blood pressures elsewhere are 114/60 up to a max of 139/89 on a day she forgot to take her pill.  Denies dizziness, headaches, chest pain.  Denies side effects of medications.   Anemia-- Lab Results  Component Value Date   WBC 6.8 08/25/2014   HGB 11.4* 08/25/2014   HCT 33.9* 08/25/2014   MCV 73.2* 08/25/2014   PLT  314 08/25/2014   Lab Results  Component Value Date   IRON 55 08/25/2014   FERRITIN 20 08/25/2014   Swollen painful area on the back completely resolved s/p doxycycline treatment (see last visit).  PMH, [place] SH reviewed.  Outpatient Encounter Prescriptions as of 02/09/2015  Medication Sig Note  . amLODipine (NORVASC) 5 MG tablet Take 1 tablet (5 mg total) by mouth daily.   . hydrochlorothiazide (HYDRODIURIL) 25 MG tablet Take 1 tablet every morning   . Multiple Vitamin (MULTIVITAMIN WITH MINERALS) TABS tablet Take 1 tablet by mouth daily. 08/25/2014: .   Marland Kitchen Vitamin D, Cholecalciferol, 1000 UNITS CAPS Take 1 capsule by mouth daily.   . metFORMIN (GLUCOPHAGE-XR) 500 MG 24 hr tablet Take 2 tablets (1,000 mg total) by mouth daily with breakfast.   . [DISCONTINUED] doxycycline (VIBRA-TABS) 100 MG tablet Take 1 tablet (100 mg total) by mouth 2 (two) times daily.   . [DISCONTINUED] metFORMIN (GLUCOPHAGE) 500 MG tablet Take 1 tablet (500 mg total) by mouth 2 (two) times daily with a meal. (Patient not taking: Reported on 02/09/2015) 02/09/2015: Has not taken in last 2 weeks due to pharmacy confusion.    No facility-administered encounter medications on file as of 02/09/2015.  (taking regular metformin BID prior to visit, changed to ER today)  Allergies  Allergen Reactions  . Dilaudid [Hydromorphone Hcl] Hives  . Morphine And Related Hives  .  Peanut-Containing Drug Products Hives  . Strawberry Swelling    Swelling is of the eye.   ROS:  No fever, chills, headaches (unless she doesn't use CPAP), dizziness, chest pain, shortness of breath, URI symptoms.  No hypoglycemia, vision changes, polyuria, polydipsia. No dysuria, bleeding, bruising, rashes, skin lesions.  Intermittent diarrhea as per HPI, no other GI complaints.  Denies depression. Slight weight gain noted. Energy better.  PHYSICAL EXAM: BP 120/70 mmHg  Pulse 76  Ht 5\' 4"  (1.626 m)  Wt 301 lb (136.533 kg)  BMI 51.64 kg/m2  LMP  02/03/2015  Morbidly obese female, in no distress HEENT: PERRL, EOMI, conjunctiva clear Neck: No lymphadenopathy, thyromegaly or bruit Heart: regular rate and rhythm without murmur Lungs: clear bilaterally Abdomen: soft, nontender, no mass Back: no CVA tenderness Extremities: no edema, normal pulses Skin: no rashes/lesions Neuro: normal cranial nerves, strength, sensation, gait Psych: normal mood, affect, hygiene and grooming  Lab Results  Component Value Date   HGBA1C 6.8 02/09/2015   A1c is up from 6.6 in March 2016.  ASSESSMENT/PLAN:  Type 2 diabetes mellitus without complication - O1L higher after starting Metformin; some missed doses.  Change to ER. R/S diabetes education/nutrition visit  Essential hypertension, benign - well controlled  OSA (obstructive sleep apnea) - sounds to be doing well on CPAP.  Need compliance report from [facility].  Vitamin D deficiency - unsure if she had adequate treatment. Will check with pharmacy to see if there is refill; if not, 2000 IU daily. recheck 3 mos  Morbid obesity with BMI of 50.0-59.9, adult - referred to nutritionist. Encouraged daily exercise, portion control  Diabetes mellitus without complication - Plan: HgB A1c, metFORMIN (GLUCOPHAGE-XR) 500 MG 24 hr tablet   Since she hasn't yet gotten the refill of the plain metformin, and she has periodically forgotten the evening dose of medication, will change to 500mg  metformin ER, to take 2 tablets together in the morning.  Will send note to pharmacy to discontinue the regular metformin.  increase vitamin D to 2000 IU daily (but check with pharmacy--if there are refills of the 50K rx, she should fill them and take them. Recheck vitamin D at f/u in 3 mos.   DMV forms filled out.  Need [facility]'s compliance report before returning form.

## 2015-02-10 ENCOUNTER — Encounter: Payer: Self-pay | Admitting: Family Medicine

## 2015-02-20 ENCOUNTER — Encounter (HOSPITAL_COMMUNITY): Payer: Self-pay | Admitting: Cardiology

## 2015-02-20 ENCOUNTER — Emergency Department (HOSPITAL_COMMUNITY)
Admission: EM | Admit: 2015-02-20 | Discharge: 2015-02-20 | Disposition: A | Discharge status: Home / Self Care | Payer: 59 | Attending: Emergency Medicine | Admitting: Emergency Medicine

## 2015-02-20 ENCOUNTER — Emergency Department (HOSPITAL_COMMUNITY): Payer: 59

## 2015-02-20 DIAGNOSIS — R0602 Shortness of breath: Secondary | ICD-10-CM | POA: Diagnosis present

## 2015-02-20 DIAGNOSIS — E669 Obesity, unspecified: Secondary | ICD-10-CM | POA: Diagnosis not present

## 2015-02-20 DIAGNOSIS — Z87828 Personal history of other (healed) physical injury and trauma: Secondary | ICD-10-CM | POA: Diagnosis not present

## 2015-02-20 DIAGNOSIS — J45901 Unspecified asthma with (acute) exacerbation: Secondary | ICD-10-CM | POA: Insufficient documentation

## 2015-02-20 DIAGNOSIS — R5383 Other fatigue: Secondary | ICD-10-CM | POA: Insufficient documentation

## 2015-02-20 DIAGNOSIS — E119 Type 2 diabetes mellitus without complications: Secondary | ICD-10-CM | POA: Insufficient documentation

## 2015-02-20 DIAGNOSIS — Z79899 Other long term (current) drug therapy: Secondary | ICD-10-CM | POA: Diagnosis not present

## 2015-02-20 DIAGNOSIS — R42 Dizziness and giddiness: Secondary | ICD-10-CM | POA: Diagnosis not present

## 2015-02-20 DIAGNOSIS — J069 Acute upper respiratory infection, unspecified: Secondary | ICD-10-CM | POA: Diagnosis not present

## 2015-02-20 DIAGNOSIS — Z8632 Personal history of gestational diabetes: Secondary | ICD-10-CM | POA: Insufficient documentation

## 2015-02-20 DIAGNOSIS — Z8742 Personal history of other diseases of the female genital tract: Secondary | ICD-10-CM | POA: Insufficient documentation

## 2015-02-20 DIAGNOSIS — Z87440 Personal history of urinary (tract) infections: Secondary | ICD-10-CM | POA: Insufficient documentation

## 2015-02-20 LAB — BASIC METABOLIC PANEL
Anion gap: 10 (ref 5–15)
BUN: 7 mg/dL (ref 6–20)
CALCIUM: 9.5 mg/dL (ref 8.9–10.3)
CO2: 24 mmol/L (ref 22–32)
Chloride: 102 mmol/L (ref 101–111)
Creatinine, Ser: 0.63 mg/dL (ref 0.44–1.00)
GFR calc non Af Amer: >60 mL/min (ref >60)
Glucose, Bld: 228 mg/dL — ABNORMAL HIGH (ref 65–99)
Potassium: 3.4 mmol/L — ABNORMAL LOW (ref 3.5–5.1)
SODIUM: 136 mmol/L (ref 135–145)

## 2015-02-20 LAB — CBC
HCT: 34.7 % — ABNORMAL LOW (ref 36.0–46.0)
Hemoglobin: 11.7 g/dL — ABNORMAL LOW (ref 12.0–15.0)
MCH: 25.5 pg — AB (ref 26.0–34.0)
MCHC: 33.7 g/dL (ref 30.0–36.0)
MCV: 75.8 fL — ABNORMAL LOW (ref 78.0–100.0)
PLATELETS: 268 10*3/uL (ref 150–400)
RBC: 4.58 MIL/uL (ref 3.87–5.11)
RDW: 14.9 % (ref 11.5–15.5)
WBC: 8.1 10*3/uL (ref 4.0–10.5)

## 2015-02-20 LAB — I-STAT TROPONIN, ED: TROPONIN I, POC: 0 ng/mL (ref 0.00–0.08)

## 2015-02-20 MED ORDER — IPRATROPIUM-ALBUTEROL 0.5-2.5 (3) MG/3ML IN SOLN
3.0000 mL | Freq: Once | RESPIRATORY_TRACT | Status: AC
  Administered 2015-02-20: 3 mL via RESPIRATORY_TRACT
  Filled 2015-02-20: qty 3

## 2015-02-20 MED ORDER — PREDNISONE 10 MG PO TABS: 20.0000 mg | ORAL_TABLET | Freq: Two times a day (BID) | ORAL | Status: DC

## 2015-02-20 MED ORDER — IPRATROPIUM-ALBUTEROL 0.5-2.5 (3) MG/3ML IN SOLN: 3.0000 mL | RESPIRATORY_TRACT | Status: DC | PRN

## 2015-02-20 MED ORDER — POTASSIUM CHLORIDE CRYS ER 20 MEQ PO TBCR
40.0000 meq | EXTENDED_RELEASE_TABLET | Freq: Once | ORAL | Status: AC
  Administered 2015-02-20: 40 meq via ORAL
  Filled 2015-02-20: qty 2

## 2015-02-20 NOTE — ED Notes (Signed)
Pt reports chest pain and SOB over the past couple of days. States she has tried otc medication for a cough. Also reports her CBG has been high.

## 2015-02-20 NOTE — Discharge Instructions (Signed)
- call your PCP to schedule a follow up appointment   Asthma Asthma is a recurring condition in which the airways tighten and narrow. Asthma can make it difficult to breathe. It can cause coughing, wheezing, and shortness of breath. Asthma episodes, also called asthma attacks, range from minor to life-threatening. Asthma cannot be cured, but medicines and lifestyle changes can help control it. CAUSES Asthma is believed to be caused by inherited (genetic) and environmental factors, but its exact cause is unknown. Asthma may be triggered by allergens, lung infections, or irritants in the air. Asthma triggers are different for each person. Common triggers include:   Animal dander.  Dust mites.  Cockroaches.  Pollen from trees or grass.  Mold.  Smoke.  Air pollutants such as dust, household cleaners, hair sprays, aerosol sprays, paint fumes, strong chemicals, or strong odors.  Cold air, weather changes, and winds (which increase molds and pollens in the air).  Strong emotional expressions such as crying or laughing hard.  Stress.  Certain medicines (such as aspirin) or types of drugs (such as beta-blockers).  Sulfites in foods and drinks. Foods and drinks that may contain sulfites include dried fruit, potato chips, and sparkling grape juice.  Infections or inflammatory conditions such as the flu, a cold, or an inflammation of the nasal membranes (rhinitis).  Gastroesophageal reflux disease (GERD).  Exercise or strenuous activity. SYMPTOMS Symptoms may occur immediately after asthma is triggered or many hours later. Symptoms include:  Wheezing.  Excessive nighttime or early morning coughing.  Frequent or severe coughing with a common cold.  Chest tightness.  Shortness of breath. DIAGNOSIS  The diagnosis of asthma is made by a review of your medical history and a physical exam. Tests may also be performed. These may include:  Lung function studies. These tests show how  much air you breathe in and out.  Allergy tests.  Imaging tests such as X-rays. TREATMENT  Asthma cannot be cured, but it can usually be controlled. Treatment involves identifying and avoiding your asthma triggers. It also involves medicines. There are 2 classes of medicine used for asthma treatment:   Controller medicines. These prevent asthma symptoms from occurring. They are usually taken every day.  Reliever or rescue medicines. These quickly relieve asthma symptoms. They are used as needed and provide short-term relief. Your health care provider will help you create an asthma action plan. An asthma action plan is a written plan for managing and treating your asthma attacks. It includes a list of your asthma triggers and how they may be avoided. It also includes information on when medicines should be taken and when their dosage should be changed. An action plan may also involve the use of a device called a peak flow meter. A peak flow meter measures how well the lungs are working. It helps you monitor your condition. HOME CARE INSTRUCTIONS   Take medicines only as directed by your health care provider. Speak with your health care provider if you have questions about how or when to take the medicines.  Use a peak flow meter as directed by your health care provider. Record and keep track of readings.  Understand and use the action plan to help minimize or stop an asthma attack without needing to seek medical care.  Control your home environment in the following ways to help prevent asthma attacks:  Do not smoke. Avoid being exposed to secondhand smoke.  Change your heating and air conditioning filter regularly.  Limit your use of fireplaces  and wood stoves.  Get rid of pests (such as roaches and mice) and their droppings.  Throw away plants if you see mold on them.  Clean your floors and dust regularly. Use unscented cleaning products.  Try to have someone else vacuum for you  regularly. Stay out of rooms while they are being vacuumed and for a short while afterward. If you vacuum, use a dust mask from a hardware store, a double-layered or microfilter vacuum cleaner bag, or a vacuum cleaner with a HEPA filter.  Replace carpet with wood, tile, or vinyl flooring. Carpet can trap dander and dust.  Use allergy-proof pillows, mattress covers, and box spring covers.  Wash bed sheets and blankets every week in hot water and dry them in a dryer.  Use blankets that are made of polyester or cotton.  Clean bathrooms and kitchens with bleach. If possible, have someone repaint the walls in these rooms with mold-resistant paint. Keep out of the rooms that are being cleaned and painted.  Wash hands frequently. SEEK MEDICAL CARE IF:   You have wheezing, shortness of breath, or a cough even if taking medicine to prevent attacks.  The colored mucus you cough up (sputum) is thicker than usual.  Your sputum changes from clear or white to yellow, green, gray, or bloody.  You have any problems that may be related to the medicines you are taking (such as a rash, itching, swelling, or trouble breathing).  You are using a reliever medicine more than 2-3 times per week.  Your peak flow is still at 50-79% of your personal best after following your action plan for 1 hour.  You have a fever. SEEK IMMEDIATE MEDICAL CARE IF:   You seem to be getting worse and are unresponsive to treatment during an asthma attack.  You are short of breath even at rest.  You get short of breath when doing very little physical activity.  You have difficulty eating, drinking, or talking due to asthma symptoms.  You develop chest pain.  You develop a fast heartbeat.  You have a bluish color to your lips or fingernails.  You are light-headed, dizzy, or faint.  Your peak flow is less than 50% of your personal best. MAKE SURE YOU:   Understand these instructions.  Will watch your  condition.  Will get help right away if you are not doing well or get worse. Document Released: 05/13/2005 Document Revised: 09/27/2013 Document Reviewed: 12/10/2012 [facility] Patient Information 2015 [place], [place]. This information is not intended to replace advice given to you by your health care provider. Make sure you discuss any questions you have with your health care provider.  Asthma Attack Prevention Although there is no way to prevent asthma from starting, you can take steps to control the disease and reduce its symptoms. Learn about your asthma and how to control it. Take an active role to control your asthma by working with your health care provider to create and follow an asthma action plan. An asthma action plan guides you in:  Taking your medicines properly.  Avoiding things that set off your asthma or make your asthma worse (asthma triggers).  Tracking your level of asthma control.  Responding to worsening asthma.  Seeking emergency care when needed. To track your asthma, keep records of your symptoms, check your peak flow number using a handheld device that shows how well air moves out of your lungs (peak flow meter), and get regular asthma checkups.  WHAT ARE SOME WAYS TO  PREVENT AN ASTHMA ATTACK?  Take medicines as directed by your health care provider.  Keep track of your asthma symptoms and level of control.  With your health care provider, write a detailed plan for taking medicines and managing an asthma attack. Then be sure to follow your action plan. Asthma is an ongoing condition that needs regular monitoring and treatment.  Identify and avoid asthma triggers. Many outdoor allergens and irritants (such as pollen, mold, cold air, and air pollution) can trigger asthma attacks. Find out what your asthma triggers are and take steps to avoid them.  Monitor your breathing. Learn to recognize warning signs of an attack, such as coughing, wheezing, or shortness of  breath. Your lung function may decrease before you notice any signs or symptoms, so regularly measure and record your peak airflow with a home peak flow meter.  Identify and treat attacks early. If you act quickly, you are less likely to have a severe attack. You will also need less medicine to control your symptoms. When your peak flow measurements decrease and alert you to an upcoming attack, take your medicine as instructed and immediately stop any activity that may have triggered the attack. If your symptoms do not improve, get medical help.  Pay attention to increasing quick-relief inhaler use. If you find yourself relying on your quick-relief inhaler, your asthma is not under control. See your health care provider about adjusting your treatment. WHAT CAN MAKE MY SYMPTOMS WORSE? A number of common things can set off or make your asthma symptoms worse and cause temporary increased inflammation of your airways. Keep track of your asthma symptoms for several weeks, detailing all the environmental and emotional factors that are linked with your asthma. When you have an asthma attack, go back to your asthma diary to see which factor, or combination of factors, might have contributed to it. Once you know what these factors are, you can take steps to control many of them. If you have allergies and asthma, it is important to take asthma prevention steps at home. Minimizing contact with the substance to which you are allergic will help prevent an asthma attack. Some triggers and ways to avoid these triggers are: Animal Dander:  Some people are allergic to the flakes of skin or dried saliva from animals with fur or feathers.   There is no such thing as a hypoallergenic dog or cat breed. All dogs or cats can cause allergies, even if they don't shed.  Keep these pets out of your home.  If you are not able to keep a pet outdoors, keep the pet out of your bedroom and other sleeping areas at all times, and keep  the door closed.  Remove carpets and furniture covered with cloth from your home. If that is not possible, keep the pet away from fabric-covered furniture and carpets. Dust Mites: Many people with asthma are allergic to dust mites. Dust mites are tiny bugs that are found in every home in mattresses, pillows, carpets, fabric-covered furniture, bedcovers, clothes, stuffed toys, and other fabric-covered items.   Cover your mattress in a special dust-proof cover.  Cover your pillow in a special dust-proof cover, or wash the pillow each week in hot water. Water must be hotter than 130 F (54.4 C) to kill dust mites. Cold or warm water used with detergent and bleach can also be effective.  Wash the sheets and blankets on your bed each week in hot water.  Try not to sleep or lie on  cloth-covered cushions.  Call ahead when traveling and ask for a smoke-free hotel room. Bring your own bedding and pillows in case the hotel only supplies feather pillows and down comforters, which may contain dust mites and cause asthma symptoms.  Remove carpets from your bedroom and those laid on concrete, if you can.  Keep stuffed toys out of the bed, or wash the toys weekly in hot water or cooler water with detergent and bleach. Cockroaches: Many people with asthma are allergic to the droppings and remains of cockroaches.   Keep food and garbage in closed containers. Never leave food out.  Use poison baits, traps, powders, gels, or paste (for example, boric acid).  If a spray is used to kill cockroaches, stay out of the room until the odor goes away. Indoor Mold:  Fix leaky faucets, pipes, or other sources of water that have mold around them.  Clean floors and moldy surfaces with a fungicide or diluted bleach.  Avoid using humidifiers, vaporizers, or swamp coolers. These can spread molds through the air. Pollen and Outdoor Mold:  When pollen or mold spore counts are high, try to keep your windows  closed.  Stay indoors with windows closed from late morning to afternoon. Pollen and some mold spore counts are highest at that time.  Ask your health care provider whether you need to take anti-inflammatory medicine or increase your dose of the medicine before your allergy season starts. Other Irritants to Avoid:  Tobacco smoke is an irritant. If you smoke, ask your health care provider how you can quit. Ask family members to quit smoking, too. Do not allow smoking in your home or car.  If possible, do not use a wood-burning stove, kerosene heater, or fireplace. Minimize exposure to all sources of smoke, including incense, candles, fires, and fireworks.  Try to stay away from strong odors and sprays, such as perfume, talcum powder, hair spray, and paints.  Decrease humidity in your home and use an indoor air cleaning device. Reduce indoor humidity to below 60%. Dehumidifiers or central air conditioners can do this.  Decrease house dust exposure by changing furnace and air cooler filters frequently.  Try to have someone else vacuum for you once or twice a week. Stay out of rooms while they are being vacuumed and for a short while afterward.  If you vacuum, use a dust mask from a hardware store, a double-layered or microfilter vacuum cleaner bag, or a vacuum cleaner with a HEPA filter.  Sulfites in foods and beverages can be irritants. Do not drink beer or wine or eat dried fruit, processed potatoes, or shrimp if they cause asthma symptoms.  Cold air can trigger an asthma attack. Cover your nose and mouth with a scarf on cold or windy days.  Several health conditions can make asthma more difficult to manage, including a runny nose, sinus infections, reflux disease, psychological stress, and sleep apnea. Work with your health care provider to manage these conditions.  Avoid close contact with people who have a respiratory infection such as a cold or the flu, since your asthma symptoms may  get worse if you catch the infection. Wash your hands thoroughly after touching items that may have been handled by people with a respiratory infection.  Get a flu shot every year to protect against the flu virus, which often makes asthma worse for days or weeks. Also get a pneumonia shot if you have not previously had one. Unlike the flu shot, the pneumonia shot does  not need to be given yearly. Medicines:  Talk to your health care provider about whether it is safe for you to take aspirin or non-steroidal anti-inflammatory medicines (NSAIDs). In a small number of people with asthma, aspirin and NSAIDs can cause asthma attacks. These medicines must be avoided by people who have known aspirin-sensitive asthma. It is important that people with aspirin-sensitive asthma read labels of all over-the-counter medicines used to treat pain, colds, coughs, and fever.  Beta-blockers and ACE inhibitors are other medicines you should discuss with your health care provider. HOW CAN I FIND OUT WHAT I AM ALLERGIC TO? Ask your asthma health care provider about allergy skin testing or blood testing (the RAST test) to identify the allergens to which you are sensitive. If you are found to have allergies, the most important thing to do is to try to avoid exposure to any allergens that you are sensitive to as much as possible. Other treatments for allergies, such as medicines and allergy shots (immunotherapy) are available.  CAN I EXERCISE? Follow your health care provider's advice regarding asthma treatment before exercising. It is important to maintain a regular exercise program, but vigorous exercise or exercise in cold, humid, or dry environments can cause asthma attacks, especially for those people who have exercise-induced asthma. Document Released: 05/01/2009 Document Revised: 05/18/2013 Document Reviewed: 11/18/2012 [facility] Patient Information 2015 [place], [place]. This information is not intended to replace advice  given to you by your health care provider. Make sure you discuss any questions you have with your health care provider.  Upper Respiratory Infection, Adult An upper respiratory infection (URI) is also sometimes known as the common cold. The upper respiratory tract includes the nose, sinuses, throat, trachea, and bronchi. Bronchi are the airways leading to the lungs. Most people improve within 1 week, but symptoms can last up to 2 weeks. A residual cough may last even longer.  CAUSES Many different viruses can infect the tissues lining the upper respiratory tract. The tissues become irritated and inflamed and often become very moist. Mucus production is also common. A cold is contagious. You can easily spread the virus to others by oral contact. This includes kissing, sharing a glass, coughing, or sneezing. Touching your mouth or nose and then touching a surface, which is then touched by another person, can also spread the virus. SYMPTOMS  Symptoms typically develop 1 to 3 days after you come in contact with a cold virus. Symptoms vary from person to person. They may include:  Runny nose.  Sneezing.  Nasal congestion.  Sinus irritation.  Sore throat.  Loss of voice (laryngitis).  Cough.  Fatigue.  Muscle aches.  Loss of appetite.  Headache.  Low-grade fever. DIAGNOSIS  You might diagnose your own cold based on familiar symptoms, since most people get a cold 2 to 3 times a year. Your caregiver can confirm this based on your exam. Most importantly, your caregiver can check that your symptoms are not due to another disease such as strep throat, sinusitis, pneumonia, asthma, or epiglottitis. Blood tests, throat tests, and X-rays are not necessary to diagnose a common cold, but they may sometimes be helpful in excluding other more serious diseases. Your caregiver will decide if any further tests are required. RISKS AND COMPLICATIONS  You may be at risk for a more severe case of the  common cold if you smoke cigarettes, have chronic heart disease (such as heart failure) or lung disease (such as asthma), or if you have a weakened immune system.  The very young and very old are also at risk for more serious infections. Bacterial sinusitis, middle ear infections, and bacterial pneumonia can complicate the common cold. The common cold can worsen asthma and chronic obstructive pulmonary disease (COPD). Sometimes, these complications can require emergency medical care and may be life-threatening. PREVENTION  The best way to protect against getting a cold is to practice good hygiene. Avoid oral or hand contact with people with cold symptoms. Wash your hands often if contact occurs. There is no clear evidence that vitamin C, vitamin E, echinacea, or exercise reduces the chance of developing a cold. However, it is always recommended to get plenty of rest and practice good nutrition. TREATMENT  Treatment is directed at relieving symptoms. There is no cure. Antibiotics are not effective, because the infection is caused by a virus, not by bacteria. Treatment may include:  Increased fluid intake. Sports drinks offer valuable electrolytes, sugars, and fluids.  Breathing heated mist or steam (vaporizer or shower).  Eating chicken soup or other clear broths, and maintaining good nutrition.  Getting plenty of rest.  Using gargles or lozenges for comfort.  Controlling fevers with ibuprofen or acetaminophen as directed by your caregiver.  Increasing usage of your inhaler if you have asthma. Zinc gel and zinc lozenges, taken in the first 24 hours of the common cold, can shorten the duration and lessen the severity of symptoms. Pain medicines may help with fever, muscle aches, and throat pain. A variety of non-prescription medicines are available to treat congestion and runny nose. Your caregiver can make recommendations and may suggest nasal or lung inhalers for other symptoms.  HOME CARE  INSTRUCTIONS   Only take over-the-counter or prescription medicines for pain, discomfort, or fever as directed by your caregiver.  Use a warm mist humidifier or inhale steam from a shower to increase air moisture. This may keep secretions moist and make it easier to breathe.  Drink enough water and fluids to keep your urine clear or pale yellow.  Rest as needed.  Return to work when your temperature has returned to normal or as your caregiver advises. You may need to stay home longer to avoid infecting others. You can also use a face mask and careful hand washing to prevent spread of the virus. SEEK MEDICAL CARE IF:   After the first few days, you feel you are getting worse rather than better.  You need your caregiver's advice about medicines to control symptoms.  You develop chills, worsening shortness of breath, or brown or red sputum. These may be signs of pneumonia.  You develop yellow or brown nasal discharge or pain in the face, especially when you bend forward. These may be signs of sinusitis.  You develop a fever, swollen neck glands, pain with swallowing, or white areas in the back of your throat. These may be signs of strep throat. SEEK IMMEDIATE MEDICAL CARE IF:   You have a fever.  You develop severe or persistent headache, ear pain, sinus pain, or chest pain.  You develop wheezing, a prolonged cough, cough up blood, or have a change in your usual mucus (if you have chronic lung disease).  You develop sore muscles or a stiff neck. Document Released: 11/06/2000 Document Revised: 08/05/2011 Document Reviewed: 08/18/2013 [facility] Patient Information 2015 [place], [place]. This information is not intended to replace advice given to you by your health care provider. Make sure you discuss any questions you have with your health care provider.
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
## 2015-03-11 ENCOUNTER — Inpatient Hospital Stay (HOSPITAL_COMMUNITY)
Admission: AD | Admit: 2015-03-11 | Discharge: 2015-03-12 | Disposition: A | Discharge status: Home / Self Care | Payer: 59 | Source: Ambulatory Visit | Attending: Obstetrics & Gynecology | Admitting: Obstetrics & Gynecology

## 2015-03-11 DIAGNOSIS — E119 Type 2 diabetes mellitus without complications: Secondary | ICD-10-CM | POA: Insufficient documentation

## 2015-03-11 DIAGNOSIS — Z3202 Encounter for pregnancy test, result negative: Secondary | ICD-10-CM | POA: Insufficient documentation

## 2015-03-11 DIAGNOSIS — Z7984 Long term (current) use of oral hypoglycemic drugs: Secondary | ICD-10-CM | POA: Insufficient documentation

## 2015-03-11 DIAGNOSIS — Z79899 Other long term (current) drug therapy: Secondary | ICD-10-CM | POA: Insufficient documentation

## 2015-03-11 DIAGNOSIS — I10 Essential (primary) hypertension: Secondary | ICD-10-CM | POA: Insufficient documentation

## 2015-03-11 DIAGNOSIS — Z885 Allergy status to narcotic agent status: Secondary | ICD-10-CM | POA: Insufficient documentation

## 2015-03-11 DIAGNOSIS — N760 Acute vaginitis: Secondary | ICD-10-CM | POA: Insufficient documentation

## 2015-03-11 HISTORY — DX: Sleep apnea, unspecified: G47.30

## 2015-03-12 ENCOUNTER — Encounter (HOSPITAL_COMMUNITY): Payer: Self-pay

## 2015-03-12 DIAGNOSIS — Z885 Allergy status to narcotic agent status: Secondary | ICD-10-CM | POA: Diagnosis not present

## 2015-03-12 DIAGNOSIS — I10 Essential (primary) hypertension: Secondary | ICD-10-CM | POA: Diagnosis not present

## 2015-03-12 DIAGNOSIS — Z7984 Long term (current) use of oral hypoglycemic drugs: Secondary | ICD-10-CM | POA: Diagnosis not present

## 2015-03-12 DIAGNOSIS — Z79899 Other long term (current) drug therapy: Secondary | ICD-10-CM | POA: Diagnosis not present

## 2015-03-12 DIAGNOSIS — N760 Acute vaginitis: Secondary | ICD-10-CM

## 2015-03-12 DIAGNOSIS — E119 Type 2 diabetes mellitus without complications: Secondary | ICD-10-CM | POA: Diagnosis not present

## 2015-03-12 DIAGNOSIS — Z3202 Encounter for pregnancy test, result negative: Secondary | ICD-10-CM | POA: Diagnosis not present

## 2015-03-12 DIAGNOSIS — N898 Other specified noninflammatory disorders of vagina: Secondary | ICD-10-CM | POA: Diagnosis present

## 2015-03-12 LAB — URINALYSIS, ROUTINE W REFLEX MICROSCOPIC
BILIRUBIN URINE: NEGATIVE
Glucose, UA: 100 mg/dL — AB
KETONES UR: NEGATIVE mg/dL
NITRITE: NEGATIVE
PROTEIN: NEGATIVE mg/dL
Specific Gravity, Urine: 1.015 (ref 1.005–1.030)
UROBILINOGEN UA: 0.2 mg/dL (ref 0.0–1.0)
pH: 6 (ref 5.0–8.0)

## 2015-03-12 LAB — URINE MICROSCOPIC-ADD ON

## 2015-03-12 LAB — WET PREP, GENITAL
Clue Cells Wet Prep HPF POC: NONE SEEN
Trich, Wet Prep: NONE SEEN
Yeast Wet Prep HPF POC: NONE SEEN

## 2015-03-12 LAB — POCT PREGNANCY, URINE: PREG TEST UR: NEGATIVE

## 2015-03-12 MED ORDER — METRONIDAZOLE 500 MG PO TABS: 500.0000 mg | ORAL_TABLET | Freq: Two times a day (BID) | ORAL | Status: DC

## 2015-03-12 MED ORDER — FLUCONAZOLE 150 MG PO TABS: 150.0000 mg | ORAL_TABLET | Freq: Every day | ORAL | Status: DC

## 2015-03-12 NOTE — MAU Note (Signed)
Pt states that she started having vaginal pain that started about 2 weeks with period, but today it has gotten much worse and has noticed that she has started swelling. Clear discharge-no odor. LMP:02/27/2015

## 2015-03-12 NOTE — Discharge Instructions (Signed)
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
## 2015-03-13 LAB — GC/CHLAMYDIA PROBE AMP (~~LOC~~) NOT AT ARMC
Chlamydia: NEGATIVE
Neisseria Gonorrhea: NEGATIVE

## 2015-03-23 ENCOUNTER — Encounter: Payer: Self-pay | Admitting: Family Medicine

## 2015-03-23 MED ORDER — METFORMIN HCL 500 MG PO TABS: 500.0000 mg | ORAL_TABLET | Freq: Two times a day (BID) | ORAL | Status: DC

## 2015-06-05 ENCOUNTER — Ambulatory Visit: Payer: 59 | Admitting: Family Medicine

## 2015-06-11 DIAGNOSIS — R42 Dizziness and giddiness: Secondary | ICD-10-CM | POA: Diagnosis not present

## 2015-06-11 DIAGNOSIS — R404 Transient alteration of awareness: Secondary | ICD-10-CM | POA: Diagnosis not present

## 2015-06-12 ENCOUNTER — Emergency Department (HOSPITAL_COMMUNITY): Payer: 59

## 2015-06-12 ENCOUNTER — Encounter (HOSPITAL_COMMUNITY): Payer: Self-pay | Admitting: Emergency Medicine

## 2015-06-12 ENCOUNTER — Emergency Department (HOSPITAL_COMMUNITY)
Admission: EM | Admit: 2015-06-12 | Discharge: 2015-06-12 | Disposition: A | Discharge status: Home / Self Care | Payer: 59 | Attending: Emergency Medicine | Admitting: Emergency Medicine

## 2015-06-12 DIAGNOSIS — E669 Obesity, unspecified: Secondary | ICD-10-CM | POA: Diagnosis not present

## 2015-06-12 DIAGNOSIS — E119 Type 2 diabetes mellitus without complications: Secondary | ICD-10-CM | POA: Insufficient documentation

## 2015-06-12 DIAGNOSIS — Z8739 Personal history of other diseases of the musculoskeletal system and connective tissue: Secondary | ICD-10-CM | POA: Insufficient documentation

## 2015-06-12 DIAGNOSIS — Z8632 Personal history of gestational diabetes: Secondary | ICD-10-CM | POA: Insufficient documentation

## 2015-06-12 DIAGNOSIS — R197 Diarrhea, unspecified: Secondary | ICD-10-CM | POA: Insufficient documentation

## 2015-06-12 DIAGNOSIS — Z3202 Encounter for pregnancy test, result negative: Secondary | ICD-10-CM | POA: Diagnosis not present

## 2015-06-12 DIAGNOSIS — Z8619 Personal history of other infectious and parasitic diseases: Secondary | ICD-10-CM | POA: Insufficient documentation

## 2015-06-12 DIAGNOSIS — I10 Essential (primary) hypertension: Secondary | ICD-10-CM | POA: Diagnosis not present

## 2015-06-12 DIAGNOSIS — H938X3 Other specified disorders of ear, bilateral: Secondary | ICD-10-CM | POA: Insufficient documentation

## 2015-06-12 DIAGNOSIS — Z79899 Other long term (current) drug therapy: Secondary | ICD-10-CM | POA: Diagnosis not present

## 2015-06-12 DIAGNOSIS — R42 Dizziness and giddiness: Secondary | ICD-10-CM | POA: Diagnosis not present

## 2015-06-12 DIAGNOSIS — Z87828 Personal history of other (healed) physical injury and trauma: Secondary | ICD-10-CM | POA: Insufficient documentation

## 2015-06-12 DIAGNOSIS — Z87440 Personal history of urinary (tract) infections: Secondary | ICD-10-CM | POA: Diagnosis not present

## 2015-06-12 DIAGNOSIS — J45909 Unspecified asthma, uncomplicated: Secondary | ICD-10-CM | POA: Insufficient documentation

## 2015-06-12 DIAGNOSIS — Z8742 Personal history of other diseases of the female genital tract: Secondary | ICD-10-CM | POA: Diagnosis not present

## 2015-06-12 DIAGNOSIS — R110 Nausea: Secondary | ICD-10-CM | POA: Insufficient documentation

## 2015-06-12 LAB — URINALYSIS, ROUTINE W REFLEX MICROSCOPIC
BILIRUBIN URINE: NEGATIVE
Glucose, UA: NEGATIVE mg/dL
HGB URINE DIPSTICK: NEGATIVE
KETONES UR: NEGATIVE mg/dL
Leukocytes, UA: NEGATIVE
NITRITE: NEGATIVE
PROTEIN: NEGATIVE mg/dL
Specific Gravity, Urine: 1.015 (ref 1.005–1.030)
pH: 7 (ref 5.0–8.0)

## 2015-06-12 LAB — COMPREHENSIVE METABOLIC PANEL
ALBUMIN: 3.2 g/dL — AB (ref 3.5–5.0)
ALK PHOS: 55 U/L (ref 38–126)
ALT: 12 U/L — AB (ref 14–54)
ANION GAP: 9 (ref 5–15)
AST: 14 U/L — ABNORMAL LOW (ref 15–41)
BILIRUBIN TOTAL: 0.5 mg/dL (ref 0.3–1.2)
BUN: 9 mg/dL (ref 6–20)
CALCIUM: 9 mg/dL (ref 8.9–10.3)
CO2: 23 mmol/L (ref 22–32)
CREATININE: 0.71 mg/dL (ref 0.44–1.00)
Chloride: 107 mmol/L (ref 101–111)
GFR calc Af Amer: >60 mL/min (ref >60)
GFR calc non Af Amer: >60 mL/min (ref >60)
GLUCOSE: 136 mg/dL — AB (ref 65–99)
Potassium: 3.7 mmol/L (ref 3.5–5.1)
Sodium: 139 mmol/L (ref 135–145)
TOTAL PROTEIN: 6.7 g/dL (ref 6.5–8.1)

## 2015-06-12 LAB — CBC
HCT: 33.3 % — ABNORMAL LOW (ref 36.0–46.0)
HEMOGLOBIN: 10.8 g/dL — AB (ref 12.0–15.0)
MCH: 24.4 pg — AB (ref 26.0–34.0)
MCHC: 32.4 g/dL (ref 30.0–36.0)
MCV: 75.3 fL — ABNORMAL LOW (ref 78.0–100.0)
PLATELETS: 297 10*3/uL (ref 150–400)
RBC: 4.42 MIL/uL (ref 3.87–5.11)
RDW: 14.8 % (ref 11.5–15.5)
WBC: 8.2 10*3/uL (ref 4.0–10.5)

## 2015-06-12 LAB — POC URINE PREG, ED: PREG TEST UR: NEGATIVE

## 2015-06-12 LAB — LIPASE, BLOOD: Lipase: 53 U/L — ABNORMAL HIGH (ref 11–51)

## 2015-06-12 MED ORDER — ONDANSETRON 8 MG PO TBDP: ORAL_TABLET | ORAL | Status: DC

## 2015-06-12 MED ORDER — MECLIZINE HCL 50 MG PO TABS: 25.0000 mg | ORAL_TABLET | Freq: Three times a day (TID) | ORAL | Status: DC | PRN

## 2015-06-12 MED ORDER — FLUTICASONE PROPIONATE 50 MCG/ACT NA SUSP: 2.0000 | Freq: Every day | NASAL | Status: DC

## 2015-06-12 MED ORDER — ONDANSETRON 4 MG PO TBDP
8.0000 mg | ORAL_TABLET | Freq: Once | ORAL | Status: AC
  Administered 2015-06-12: 8 mg via ORAL
  Filled 2015-06-12: qty 2

## 2015-06-12 MED ORDER — MECLIZINE HCL 25 MG PO TABS
25.0000 mg | ORAL_TABLET | Freq: Once | ORAL | Status: AC
  Administered 2015-06-12: 25 mg via ORAL
  Filled 2015-06-12: qty 1

## 2015-06-12 MED FILL — ONDANSETRON ODT 8 MG TABLET: 8 | 2 days supply | Qty: 4 | Fill #0

## 2015-06-12 MED FILL — MECLIZINE 25 MG TABLET: 25 | 20 days supply | Qty: 60 | Fill #0

## 2015-06-12 MED FILL — FLUTICASONE PROP 50 MCG SPR: 50 | 30 days supply | Qty: 16 | Fill #0

## 2015-06-12 NOTE — ED Notes (Signed)
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  Pt reports lower abd/pelvic cramping x 2 days with loose stool.  Went to work at [employer] and c/o nausea.  Nausea got worse once at work and pt started to feel dizzy.  BP 190/110 per EMS.  EMS administered Zofran 4mg  IV with "a little" relief.
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
## 2015-06-12 NOTE — Discharge Instructions (Signed)
Benign Positional Vertigo Vertigo is the feeling that you or your surroundings are moving when they are not. Benign positional vertigo is the most common form of vertigo. The cause of this condition is not serious (is benign). This condition is triggered by certain movements and positions (is positional). This condition can be dangerous if it occurs while you are doing something that could endanger you or others, such as driving.  CAUSES In many cases, the cause of this condition is not known. It may be caused by a disturbance in an area of the inner ear that helps your brain to sense movement and balance. This disturbance can be caused by a viral infection (labyrinthitis), head injury, or repetitive motion. RISK FACTORS This condition is more likely to develop in:  Women.  People who are 50 years of age or older. SYMPTOMS Symptoms of this condition usually happen when you move your head or your eyes in different directions. Symptoms may start suddenly, and they usually last for less than a minute. Symptoms may include:  Loss of balance and falling.  Feeling like you are spinning or moving.  Feeling like your surroundings are spinning or moving.  Nausea and vomiting.  Blurred vision.  Dizziness.  Involuntary eye movement (nystagmus). Symptoms can be mild and cause only slight annoyance, or they can be severe and interfere with daily life. Episodes of benign positional vertigo may return (recur) over time, and they may be triggered by certain movements. Symptoms may improve over time. DIAGNOSIS This condition is usually diagnosed by medical history and a physical exam of the head, neck, and ears. You may be referred to a health care provider who specializes in ear, nose, and throat (ENT) problems (otolaryngologist) or a provider who specializes in disorders of the nervous system (neurologist). You may have additional testing, including:  MRI.  A CT scan.  Eye movement tests. Your  health care provider may ask you to change positions quickly while he or she watches you for symptoms of benign positional vertigo, such as nystagmus. Eye movement may be tested with an electronystagmogram (ENG), caloric stimulation, the Dix-Hallpike test, or the roll test.  An electroencephalogram (EEG). This records electrical activity in your brain.  Hearing tests. TREATMENT Usually, your health care provider will treat this by moving your head in specific positions to adjust your inner ear back to normal. Surgery may be needed in severe cases, but this is rare. In some cases, benign positional vertigo may resolve on its own in 2-4 weeks. HOME CARE INSTRUCTIONS Safety  Move slowly.Avoid sudden body or head movements.  Avoid driving.  Avoid operating heavy machinery.  Avoid doing any tasks that would be dangerous to you or others if a vertigo episode would occur.  If you have trouble walking or keeping your balance, try using a cane for stability. If you feel dizzy or unstable, sit down right away.  Return to your normal activities as told by your health care provider. Ask your health care provider what activities are safe for you. General Instructions  Take over-the-counter and prescription medicines only as told by your health care provider.  Avoid certain positions or movements as told by your health care provider.  Drink enough fluid to keep your urine clear or pale yellow.  Keep all follow-up visits as told by your health care provider. This is important. SEEK MEDICAL CARE IF:  You have a fever.  Your condition gets worse or you develop new symptoms.  Your family or friends   notice any behavioral changes.  Your nausea or vomiting gets worse.  You have numbness or a "pins and needles" sensation. SEEK IMMEDIATE MEDICAL CARE IF:  You have difficulty speaking or moving.  You are always dizzy.  You faint.  You develop severe headaches.  You have weakness in your  legs or arms.  You have changes in your hearing or vision.  You develop a stiff neck.  You develop sensitivity to light.   This information is not intended to replace advice given to you by your health care provider. Make sure you discuss any questions you have with your health care provider.   Document Released: 02/18/2006 Document Revised: 02/01/2015 Document Reviewed: 09/05/2014 Elsevier Interactive Patient Education 2016 Else[employer] Inc[employer]  

## 2015-06-14 ENCOUNTER — Telehealth: Payer: Self-pay | Admitting: *Deleted

## 2015-06-14 NOTE — Telephone Encounter (Signed)
Called patient to see how she was feeling post ER visit, says she is still feeling a bit dizzy. Tried to r/s her med check that was cx 06/05/15(due to snow). SHe states that she has to try to find a ride as she still cannot drive and she will call back.

## 2015-06-15 ENCOUNTER — Encounter: Payer: Self-pay | Admitting: Family Medicine

## 2015-06-15 ENCOUNTER — Ambulatory Visit (INDEPENDENT_AMBULATORY_CARE_PROVIDER_SITE_OTHER): Payer: 59 | Admitting: Family Medicine

## 2015-06-15 VITALS — BP 150/90 | HR 80 | Ht 64.0 in | Wt 302.8 lb

## 2015-06-15 DIAGNOSIS — G4733 Obstructive sleep apnea (adult) (pediatric): Secondary | ICD-10-CM

## 2015-06-15 DIAGNOSIS — E559 Vitamin D deficiency, unspecified: Secondary | ICD-10-CM | POA: Diagnosis not present

## 2015-06-15 DIAGNOSIS — I10 Essential (primary) hypertension: Secondary | ICD-10-CM

## 2015-06-15 DIAGNOSIS — D649 Anemia, unspecified: Secondary | ICD-10-CM

## 2015-06-15 DIAGNOSIS — E119 Type 2 diabetes mellitus without complications: Secondary | ICD-10-CM

## 2015-06-15 DIAGNOSIS — N3000 Acute cystitis without hematuria: Secondary | ICD-10-CM | POA: Diagnosis not present

## 2015-06-15 DIAGNOSIS — R300 Dysuria: Secondary | ICD-10-CM | POA: Diagnosis not present

## 2015-06-15 LAB — POCT GLYCOSYLATED HEMOGLOBIN (HGB A1C): HEMOGLOBIN A1C: 7.5

## 2015-06-15 LAB — POCT URINALYSIS DIPSTICK
Bilirubin, UA: NEGATIVE
Glucose, UA: NEGATIVE
Ketones, UA: NEGATIVE
PH UA: 6
RBC UA: NEGATIVE
Spec Grav, UA: >=1.03
UROBILINOGEN UA: NEGATIVE

## 2015-06-15 LAB — POCT CBG (FASTING - GLUCOSE)-MANUAL ENTRY: GLUCOSE FASTING, POC: 110 mg/dL — AB (ref 70–99)

## 2015-06-15 LAB — CBC WITH DIFFERENTIAL/PLATELET
BASOS ABS: 0 10*3/uL (ref 0.0–0.1)
BASOS PCT: 0 % (ref 0–1)
EOS ABS: 0.1 10*3/uL (ref 0.0–0.7)
Eosinophils Relative: 2 % (ref 0–5)
HCT: 33.5 % — ABNORMAL LOW (ref 36.0–46.0)
Hemoglobin: 11.1 g/dL — ABNORMAL LOW (ref 12.0–15.0)
Lymphocytes Relative: 35 % (ref 12–46)
Lymphs Abs: 2.5 10*3/uL (ref 0.7–4.0)
MCH: 24.8 pg — ABNORMAL LOW (ref 26.0–34.0)
MCHC: 33.1 g/dL (ref 30.0–36.0)
MCV: 74.8 fL — ABNORMAL LOW (ref 78.0–100.0)
MPV: 9 fL (ref 8.6–12.4)
Monocytes Absolute: 0.5 10*3/uL (ref 0.1–1.0)
Monocytes Relative: 7 % (ref 3–12)
NEUTROS ABS: 3.9 10*3/uL (ref 1.7–7.7)
NEUTROS PCT: 56 % (ref 43–77)
PLATELETS: 310 10*3/uL (ref 150–400)
RBC: 4.48 MIL/uL (ref 3.87–5.11)
RDW: 16 % — ABNORMAL HIGH (ref 11.5–15.5)
WBC: 7 10*3/uL (ref 4.0–10.5)

## 2015-06-15 MED ORDER — METFORMIN HCL 500 MG PO TABS: 500.0000 mg | ORAL_TABLET | Freq: Two times a day (BID) | ORAL | Status: DC

## 2015-06-15 MED ORDER — AMLODIPINE BESYLATE 5 MG PO TABS: 5.0000 mg | ORAL_TABLET | Freq: Every day | ORAL | Status: DC

## 2015-06-15 MED ORDER — CIPROFLOXACIN HCL 500 MG PO TABS: 500.0000 mg | ORAL_TABLET | Freq: Two times a day (BID) | ORAL | Status: DC

## 2015-06-15 MED ORDER — HYDROCHLOROTHIAZIDE 25 MG PO TABS: ORAL_TABLET | ORAL | Status: DC

## 2015-06-15 MED FILL — HYDROCHLOROTHIAZIDE 25 MG T: 25 | 90 days supply | Qty: 90 | Fill #0

## 2015-06-15 MED FILL — CIPROFLOXACIN HCL 500 MG TA: 500 | 3 days supply | Qty: 6 | Fill #0

## 2015-06-15 MED FILL — AMLODIPINE BESYLATE 5 MG TA: 5 | 90 days supply | Qty: 90 | Fill #0

## 2015-06-15 MED FILL — metFORMIN HCL 500 MG TABS: 500 | 90 days supply | Qty: 180 | Fill #0

## 2015-06-15 NOTE — Patient Instructions (Addendum)
It appears that you have a bladder infection, which is causing the stomach cramping, urinary burning, and possibly the higher blood sugars. We are sending the urine for culture, and will let you know if we need to change the antibiotics, based on the sensitivity of the culture.  Your A1c is above goal.  Higher blood sugars from the infection might contribute, but my guess it isn't just that.  This is over the last 3 months, so the holidays may contribute to the higher A1c.  Try and get daily exercise, limit your sugars and sweets, and try and lose weight. We will continue the metformin the same for now, and will need to increase the dose if your sugars don't come back down to where they usually are (send [place] a message or call with your numbers in the next few weeks if still high).  If your A1c remains >7 in 3 months, then meds will need to be adjusted.  We don't need to wait until then if we see that your sugars are still running high after the infection has been treated. Be sure to drink plenty of fluids.  Your blood pressure was above goal.  Since your check it regularly at work, and your values are normal there, I'm hesitant to change your medication.  Being off the CPAP can raise the blood pressure.  We will contact [facility] for new CPAP supplies. Continue to monitor your blood pressure regularly.  Let us know if they are consistently >140/90.  Try and limit the sodium/salt in your diet.  If you have recurrent vertigo--use JUST HALF of the meclizine.  It is a high dose and will be sedating.  If still sedating, you can try cutting into 1/4ths vs getting OTC meclizine at the 12.5mg  dose.  Urinary Tract Infection Urinary tract infections (UTIs) can develop anywhere along your urinary tract. Your urinary tract is your body's drainage system for removing wastes and extra water. Your urinary tract includes two kidneys, two ureters, a bladder, and a urethra. Your kidneys are a pair of bean-shaped organs.  Each kidney is about the size of your fist. They are located below your ribs, one on each side of your spine. CAUSES Infections are caused by microbes, which are microscopic organisms, including fungi, viruses, and bacteria. These organisms are so small that they can only be seen through a microscope. Bacteria are the microbes that most commonly cause UTIs. SYMPTOMS  Symptoms of UTIs may vary by age and gender of the patient and by the location of the infection. Symptoms in young women typically include a frequent and intense urge to urinate and a painful, burning feeling in the bladder or urethra during urination. Older women and men are more likely to be tired, shaky, and weak and have muscle aches and abdominal pain. A fever may mean the infection is in your kidneys. Other symptoms of a kidney infection include pain in your back or sides below the ribs, nausea, and vomiting. DIAGNOSIS To diagnose a UTI, your caregiver will ask you about your symptoms. Your caregiver will also ask you to provide a urine sample. The urine sample will be tested for bacteria and white blood cells. White blood cells are made by your body to help fight infection. TREATMENT  Typically, UTIs can be treated with medication. Because most UTIs are caused by a bacterial infection, they usually can be treated with the use of antibiotics. The choice of antibiotic and length of treatment depend on your symptoms and  the type of bacteria causing your infection. HOME CARE INSTRUCTIONS  If you were prescribed antibiotics, take them exactly as your caregiver instructs you. Finish the medication even if you feel better after you have only taken some of the medication.  Drink enough water and fluids to keep your urine clear or pale yellow.  Avoid caffeine, tea, and carbonated beverages. They tend to irritate your bladder.  Empty your bladder often. Avoid holding urine for long periods of time.  Empty your bladder before and after  sexual intercourse.  After a bowel movement, women should cleanse from front to back. Use each tissue only once. SEEK MEDICAL CARE IF:   You have back pain.  You develop a fever.  Your symptoms do not begin to resolve within 3 days. SEEK IMMEDIATE MEDICAL CARE IF:   You have severe back pain or lower abdominal pain.  You develop chills.  You have nausea or vomiting.  You have continued burning or discomfort with urination. MAKE SURE YOU:   Understand these instructions.  Will watch your condition.  Will get help right away if you are not doing well or get worse.   Low-Sodium Eating Plan Sodium raises blood pressure and causes water to be held in the body. Getting less sodium from food will help lower your blood pressure, reduce any swelling, and protect your heart, liver, and kidneys. We get sodium by adding salt (sodium chloride) to food. Most of our sodium comes from canned, boxed, and frozen foods. Restaurant foods, fast foods, and pizza are also very high in sodium. Even if you take medicine to lower your blood pressure or to reduce fluid in your body, getting less sodium from your food is important. WHAT IS MY PLAN? Most people should limit their sodium intake to 2,300 mg a day. Your health care provider recommends that you limit your sodium intake to __________ a day.  WHAT DO I NEED TO KNOW ABOUT THIS EATING PLAN? For the low-sodium eating plan, you will follow these general guidelines:  Choose foods with a % Daily Value for sodium of less than 5% (as listed on the food label).   Use salt-free seasonings or herbs instead of table salt or sea salt.   Check with your health care provider or pharmacist before using salt substitutes.   Eat fresh foods.  Eat more vegetables and fruits.  Limit canned vegetables. If you do use them, rinse them well to decrease the sodium.   Limit cheese to 1 oz (28 g) per day.   Eat lower-sodium products, often labeled as "lower  sodium" or "no salt added."  Avoid foods that contain monosodium glutamate (MSG). MSG is sometimes added to [place] food and some canned foods.  Check food labels (Nutrition Facts labels) on foods to learn how much sodium is in one serving.  Eat more home-cooked food and less restaurant, buffet, and fast food.  When eating at a restaurant, ask that your food be prepared with less salt, or no salt if possible.  HOW DO I READ FOOD LABELS FOR SODIUM INFORMATION? The Nutrition Facts label lists the amount of sodium in one serving of the food. If you eat more than one serving, you must multiply the listed amount of sodium by the number of servings. Food labels may also identify foods as:  Sodium free--Less than 5 mg in a serving.  Very low sodium--35 mg or less in a serving.  Low sodium--140 mg or less in a serving.  Light in  sodium--50% less sodium in a serving. For example, if a food that usually has 300 mg of sodium is changed to become light in sodium, it will have 150 mg of sodium.  Reduced sodium--25% less sodium in a serving. For example, if a food that usually has 400 mg of sodium is changed to reduced sodium, it will have 300 mg of sodium. WHAT FOODS CAN I EAT? Grains Low-sodium cereals, including oats, puffed wheat and rice, and shredded wheat cereals. Low-sodium crackers. Unsalted rice and pasta. Lower-sodium bread.  Vegetables Frozen or fresh vegetables. Low-sodium or reduced-sodium canned vegetables. Low-sodium or reduced-sodium tomato sauce and paste. Low-sodium or reduced-sodium tomato and vegetable juices.  Fruits Fresh, frozen, and canned fruit. Fruit juice.  Meat and Other Protein Products Low-sodium canned tuna and salmon. Fresh or frozen meat, poultry, seafood, and fish. Lamb. Unsalted nuts. Dried beans, peas, and lentils without added salt. Unsalted canned beans. Homemade soups without salt. Eggs.  Dairy Milk. Soy milk. Ricotta cheese. Low-sodium or  reduced-sodium cheeses. Yogurt.  Condiments Fresh and dried herbs and spices. Salt-free seasonings. Onion and garlic powders. Low-sodium varieties of mustard and ketchup. Fresh or refrigerated horseradish. Lemon juice.  Fats and Oils Reduced-sodium salad dressings. Unsalted butter.  Other Unsalted popcorn and pretzels.  The items listed above may not be a complete list of recommended foods or beverages. Contact your dietitian for more options. WHAT FOODS ARE NOT RECOMMENDED? Grains Instant hot cereals. Bread stuffing, pancake, and biscuit mixes. Croutons. Seasoned rice or pasta mixes. Noodle soup cups. Boxed or frozen macaroni and cheese. Self-rising flour. Regular salted crackers. Vegetables Regular canned vegetables. Regular canned tomato sauce and paste. Regular tomato and vegetable juices. Frozen vegetables in sauces. Salted [place] fries. Olives. Angie Fava. Relishes. Sauerkraut. Salsa. Meat and Other Protein Products Salted, canned, smoked, spiced, or pickled meats, seafood, or fish. Bacon, ham, sausage, hot dogs, corned beef, chipped beef, and packaged luncheon meats. Salt pork. Jerky. Pickled herring. Anchovies, regular canned tuna, and sardines. Salted nuts. Dairy Processed cheese and cheese spreads. Cheese curds. Blue cheese and cottage cheese. Buttermilk.  Condiments Onion and garlic salt, seasoned salt, table salt, and sea salt. Canned and packaged gravies. Worcestershire sauce. Tartar sauce. Barbecue sauce. Teriyaki sauce. Soy sauce, including reduced sodium. Steak sauce. Fish sauce. Oyster sauce. Cocktail sauce. Horseradish that you find on the shelf. Regular ketchup and mustard. Meat flavorings and tenderizers. Bouillon cubes. Hot sauce. Tabasco sauce. Marinades. Taco seasonings. Relishes. Fats and Oils Regular salad dressings. Salted butter. Margarine. Ghee. Bacon fat.  Other Potato and tortilla chips. Corn chips and puffs. Salted popcorn and pretzels. Canned or dried  soups. Pizza. Frozen entrees and pot pies.  The items listed above may not be a complete list of foods and beverages to avoid. Contact your dietitian for more information.   This information is not intended to replace advice given to you by your health care provider. Make sure you discuss any questions you have with your health care provider.   Document Released: 11/02/2001 Document Revised: 06/03/2014 Document Reviewed: 03/17/2013 Elsevier Interactive Patient Education [employer].

## 2015-06-15 NOTE — Progress Notes (Signed)
Chief Complaint  Patient presents with  . Diabetes    nonfasting med check.    Diabetes: She started Metformin in May. We changed to the ER after September visit due to some missed evening doses.  She saw her sugars rise, so we changed her back to the regular BID metformin.  Sugars went back down at that time.  Sugars in the mornings have been running 170's-180's over the last few weeks.  Prior to that had been in the 120's-130's.  Denies any change in the medication, diet. The only change is that she hasn't been wearing her CPAP.  See below.  Max blood sugar was 383 1.5 weeks ago--denies missed pill or change in diet.  She took an extra metformin and it eventually came down.  She had felt funny that day. Denies blurred vision. She is feeling very thirsty, +polydipsia and polyuria.  She had a yeast infection a couple of weeks ago, treated with Monistat.  Symptoms resolved. She noticed bloody urine a couple of weeks ago--it resolved on its own, so didn't seek evaluation. She had normal urinalysis in ER on 1/16, but is complaining of some burning with urination today, as well as crampy lower abdominal pain.  She plans to contact [facility] program through [facility] (to help with weight, diabetes, overall health).  Vitamin D deficiency: she has had prescription replacement, and is compliant with taking daily OTC vitamin D.  OSA: She had been using her CPAP nightly.She gets a headache if she doesn't use it. She reports the machine is old, and the equipment isn't working well (straps are too old).  She hasn't been able to use it, waiting on rx from our office to get new supplies/mask. We haven't been contacted regarding a new prescription from [facility].  Hypertension follow-up: Blood pressures elsewhere are running 105/78, up to 123/85 at work. She had 190/112 when she had vertigo at work 1/16. BP's since then have been normal. She has been having mild headaches, across her forehead,  since not using the CPAP.  No chest pain, palpitations, light headedness, syncope.  Vertigo has improved significantly, but can now function with it.  Only one slight episode today.  Didn't read the bottle and the 2 times she took the meclizine, she took the full 50mg  tablet and it made her very sleepy.   Anemia:  Hg was 10.8 on recent ER visit.  She regular menses, which are very heavy. She doesn't take iron supplements. This is down from Hg 11.7 on last check here in September.  PMH, PSH, SH reviewed.  Outpatient Encounter Prescriptions as of 06/15/2015  Medication Sig Note  . amLODipine (NORVASC) 5 MG tablet Take 1 tablet (5 mg total) by mouth daily.   . fluticasone (FLONASE) 50 MCG/ACT nasal spray Place 2 sprays into both nostrils daily.   . hydrochlorothiazide (HYDRODIURIL) 25 MG tablet Take 1 tablet every morning   . metFORMIN (GLUCOPHAGE) 500 MG tablet Take 1 tablet (500 mg total) by mouth 2 (two) times daily with a meal.   . Vitamin D, Cholecalciferol, 1000 UNITS CAPS Take 1 capsule by mouth daily.   Marland Kitchen ipratropium-albuterol (DUONEB) 0.5-2.5 (3) MG/3ML SOLN Take 3 mLs by nebulization every 4 (four) hours as needed. (Patient not taking: Reported on 06/12/2015) 06/15/2015: Uses prn, not since it was prescribed during an illness  . meclizine (ANTIVERT) 50 MG tablet Take 0.5 tablets (25 mg total) by mouth 3 (three) times daily as needed for dizziness. (Patient not taking:  Reported on 06/15/2015) 06/15/2015: Took it twice--full tablet, very sedating  . [DISCONTINUED] fluconazole (DIFLUCAN) 150 MG tablet Take 1 tablet (150 mg total) by mouth daily. Take 1 tablet today and repeat in 72 hours (Patient not taking: Reported on 06/12/2015)   . [DISCONTINUED] metroNIDAZOLE (FLAGYL) 500 MG tablet Take 1 tablet (500 mg total) by mouth 2 (two) times daily. (Patient not taking: Reported on 06/12/2015)   . [DISCONTINUED] ondansetron (ZOFRAN ODT) 8 MG disintegrating tablet 8mg  ODT q8 hours prn nausea    No  facility-administered encounter medications on file as of 06/15/2015.   Allergies  Allergen Reactions  . Dilaudid [Hydromorphone Hcl] Hives  . Morphine And Related Hives  . Peanut-Containing Drug Products Hives  . Strawberry Extract Swelling    Swelling is of the eye.   ROS: no fever, chills.  +headaches and vertigo per HPI.  No vomiting, diarrhea, bleeding, bruising, rash.  +urinary complaints as per HPI.  No URI symptoms, cough, shortness of breath, chest pain or other concerns. See HPI.  PHYSICAL EXAM: BP 150/90 mmHg  Pulse 80  Ht 5\' 4"  (1.626 m)  Wt 302 lb 12.8 oz (137.349 kg)  BMI 51.95 kg/m2  LMP 05/28/2015 154/94 on repeat by MD, RA Well appearing female in no distress Neck: no lymphadenopathy, thyromegaly or mss Heart: regular rate and rhythm Lungs: clear bilaterally Back: no spinal or CVA tenderness Abdomen: soft, normal bowel suonds.  No organomegaly or mass. Mild suprapubic tenderness Extremities: no edema, normal pulses Psych: normal mood, affect, hygiene and grooming Neuro: alert and oriented, cranial nerves intact. Normal strength, gait  Lab Results  Component Value Date   HGBA1C 7.5 06/15/2015   (up from 6.8 in September).  Urine dip: 3+ leuks, +nit, SG 1.030.  Negative glucose/ketones. Glucose 110 (about 3-4 hours after eating)  ASSESSMENT/PLAN:  Diabetes mellitus without complication (HCC) - worse control, A1c >7. recent high sugars may be related to UTI. Cont metformin at BID; increase if sugars don't improve after ABX - Plan: HgB A1c, Microalbumin / creatinine urine ratio, TSH, metFORMIN (GLUCOPHAGE) 500 MG tablet, Glucose (CBG), Fasting  Type 2 diabetes mellitus without complication, without long-term current use of insulin (HCC)  Essential hypertension, benign - elevated today; had been normal when regularly checked at work.  May be related to not using CPAP.  Will get new mask/supplies - Plan: hydrochlorothiazide (HYDRODIURIL) 25 MG tablet, amLODipine  (NORVASC) 5 MG tablet  OSA (obstructive sleep apnea) - we will contact [facility] and get new mask/tubing so she can resume use of CPAP  Vitamin D deficiency - Last check was 07/2014, low at 22.  Compliant with supplements; recheck today - Plan: VITAMIN D 25 Hydroxy (Vit-D Deficiency, Fractures)  Acute cystitis without hematuria - Plan: Urine culture, ciprofloxacin (CIPRO) 500 MG tablet  Anemia, unspecified anemia type - Plan: CBC with Differential/Platelet, Ferritin  Dysuria - Plan: POCT Urinalysis Dipstick   HTN--elevated levels since off CPAP.  Need to get her supplies so she can resume CPAP use. This will help with blood pressure and headaches.  Cbc, ferritin, Vit D, TSH, urine microalb Urine culture   It appears that you have a bladder infection, which is causing the stomach cramping, urinary burning, and possibly the higher blood sugars. We are sending the urine for culture, and will let you know if we need to change the antibiotics, based on the sensitivity of the culture.  Your A1c is above goal.  Higher blood sugars from the infection might contribute, but my guess  it isn't just that.  This is over the last 3 months, so the holidays may contribute to the higher A1c.  Try and get daily exercise, limit your sugars and sweets, and try and lose weight. We will continue the metformin the same for now, and will need to increase the dose if your sugars don't come back down to where they usually are (send [place] a message or call with your numbers in the next few weeks if still high).  If your A1c remains >7 in 3 months, then meds will need to be adjusted.  We don't need to wait until then if we see that your sugars are still running high after the infection has been treated. Be sure to drink plenty of fluids.  Your blood pressure was above goal.  Since your check it regularly at work, and your values are normal there, I'm hesitant to change your medication.  Being off the CPAP can raise the  blood pressure.  We will contact [facility] for new CPAP supplies. Continue to monitor your blood pressure regularly.  Let us know if they are consistently >140/90.  Try and limit the sodium/salt in your diet.  If you have recurrent vertigo--use JUST HALF of the meclizine.  It is a high dose and will be sedating.  If still sedating, you can try cutting into 1/4ths vs getting OTC meclizine at the 12.5mg  dose.

## 2015-06-16 LAB — FERRITIN: FERRITIN: 15 ng/mL (ref 10–291)

## 2015-06-16 LAB — MICROALBUMIN / CREATININE URINE RATIO
Creatinine, Urine: 218 mg/dL (ref 20–320)
Microalb Creat Ratio: 41 mcg/mg creat — ABNORMAL HIGH (ref <30)
Microalb, Ur: 8.9 mg/dL

## 2015-06-16 LAB — VITAMIN D 25 HYDROXY (VIT D DEFICIENCY, FRACTURES): VIT D 25 HYDROXY: 26 ng/mL — AB (ref 30–100)

## 2015-06-16 LAB — TSH: TSH: 1.565 u[IU]/mL (ref 0.350–4.500)

## 2015-06-18 LAB — URINE CULTURE: Colony Count: >=100000

## 2015-06-27 DIAGNOSIS — J45909 Unspecified asthma, uncomplicated: Secondary | ICD-10-CM | POA: Diagnosis not present

## 2015-06-27 DIAGNOSIS — G4733 Obstructive sleep apnea (adult) (pediatric): Secondary | ICD-10-CM | POA: Diagnosis not present

## 2015-07-24 ENCOUNTER — Encounter: Payer: Self-pay | Admitting: Family Medicine

## 2015-07-28 DIAGNOSIS — N911 Secondary amenorrhea: Secondary | ICD-10-CM | POA: Diagnosis not present

## 2015-07-28 DIAGNOSIS — Z3201 Encounter for pregnancy test, result positive: Secondary | ICD-10-CM | POA: Diagnosis not present

## 2015-08-06 ENCOUNTER — Inpatient Hospital Stay (HOSPITAL_COMMUNITY): Payer: 59

## 2015-08-06 ENCOUNTER — Encounter (HOSPITAL_COMMUNITY): Payer: Self-pay | Admitting: *Deleted

## 2015-08-06 ENCOUNTER — Inpatient Hospital Stay (HOSPITAL_COMMUNITY)
Admission: AD | Admit: 2015-08-06 | Discharge: 2015-08-06 | Disposition: A | Discharge status: Home / Self Care | Payer: 59 | Source: Ambulatory Visit | Attending: Obstetrics and Gynecology | Admitting: Obstetrics and Gynecology

## 2015-08-06 DIAGNOSIS — O10911 Unspecified pre-existing hypertension complicating pregnancy, first trimester: Secondary | ICD-10-CM

## 2015-08-06 DIAGNOSIS — O209 Hemorrhage in early pregnancy, unspecified: Secondary | ICD-10-CM | POA: Diagnosis not present

## 2015-08-06 DIAGNOSIS — O26851 Spotting complicating pregnancy, first trimester: Secondary | ICD-10-CM | POA: Diagnosis not present

## 2015-08-06 DIAGNOSIS — E119 Type 2 diabetes mellitus without complications: Secondary | ICD-10-CM | POA: Diagnosis not present

## 2015-08-06 DIAGNOSIS — Z7984 Long term (current) use of oral hypoglycemic drugs: Secondary | ICD-10-CM | POA: Insufficient documentation

## 2015-08-06 DIAGNOSIS — O4691 Antepartum hemorrhage, unspecified, first trimester: Secondary | ICD-10-CM | POA: Diagnosis not present

## 2015-08-06 DIAGNOSIS — O24111 Pre-existing diabetes mellitus, type 2, in pregnancy, first trimester: Secondary | ICD-10-CM | POA: Diagnosis not present

## 2015-08-06 DIAGNOSIS — Z3A01 Less than 8 weeks gestation of pregnancy: Secondary | ICD-10-CM | POA: Insufficient documentation

## 2015-08-06 DIAGNOSIS — Z3491 Encounter for supervision of normal pregnancy, unspecified, first trimester: Secondary | ICD-10-CM

## 2015-08-06 DIAGNOSIS — O10011 Pre-existing essential hypertension complicating pregnancy, first trimester: Secondary | ICD-10-CM | POA: Insufficient documentation

## 2015-08-06 LAB — CBC
HEMATOCRIT: 30.6 % — AB (ref 36.0–46.0)
HEMOGLOBIN: 10 g/dL — AB (ref 12.0–15.0)
MCH: 24.4 pg — AB (ref 26.0–34.0)
MCHC: 32.7 g/dL (ref 30.0–36.0)
MCV: 74.8 fL — AB (ref 78.0–100.0)
Platelets: 281 10*3/uL (ref 150–400)
RBC: 4.09 MIL/uL (ref 3.87–5.11)
RDW: 15.5 % (ref 11.5–15.5)
WBC: 6.6 10*3/uL (ref 4.0–10.5)

## 2015-08-06 LAB — WET PREP, GENITAL
Clue Cells Wet Prep HPF POC: NONE SEEN
SPERM: NONE SEEN
Trich, Wet Prep: NONE SEEN
Yeast Wet Prep HPF POC: NONE SEEN

## 2015-08-06 LAB — POCT PREGNANCY, URINE: Preg Test, Ur: POSITIVE — AB

## 2015-08-06 LAB — HCG, QUANTITATIVE, PREGNANCY: hCG, Beta Chain, Quant, S: 24658 m[IU]/mL — ABNORMAL HIGH (ref <5)
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
## 2015-08-06 NOTE — MAU Note (Signed)
Vaginal bleeding that started around noon while in church. Went to bathroom and noticed light pink blood in underwear with wiping.   Intercourse Friday morning around 4 am

## 2015-08-06 NOTE — Discharge Instructions (Signed)
Vaginal Bleeding During Pregnancy, First Trimester A small amount of bleeding (spotting) from the vagina is relatively common in early pregnancy. It usually stops on its own. Various things may cause bleeding or spotting in early pregnancy. Some bleeding may be related to the pregnancy, and some may not. In most cases, the bleeding is normal and is not a problem. However, bleeding can also be a sign of something serious. Be sure to tell your health care provider about any vaginal bleeding right away. Some possible causes of vaginal bleeding during the first trimester include:  Infection or inflammation of the cervix.  Growths (polyps) on the cervix.  Miscarriage or threatened miscarriage.  Pregnancy tissue has developed outside of the uterus and in a fallopian tube (tubal pregnancy).  Tiny cysts have developed in the uterus instead of pregnancy tissue (molar pregnancy). HOME CARE INSTRUCTIONS  Watch your condition for any changes. The following actions may help to lessen any discomfort you are feeling:  Follow your health care provider's instructions for limiting your activity. If your health care provider orders bed rest, you may need to stay in bed and only get up to use the bathroom. However, your health care provider may allow you to continue light activity.  If needed, make plans for someone to help with your regular activities and responsibilities while you are on bed rest.  Keep track of the number of pads you use each day, how often you change pads, and how soaked (saturated) they are. Write this down.  Do not use tampons. Do not douche.  Do not have sexual intercourse or orgasms until approved by your health care provider.  If you pass any tissue from your vagina, save the tissue so you can show it to your health care provider.  Only take over-the-counter or prescription medicines as directed by your health care provider.  Do not take aspirin because it can make you  bleed.  Keep all follow-up appointments as directed by your health care provider. SEEK MEDICAL CARE IF:  You have any vaginal bleeding during any part of your pregnancy.  You have cramps or labor pains.  You have a fever, not controlled by medicine. SEEK IMMEDIATE MEDICAL CARE IF:   You have severe cramps in your back or belly (abdomen).  You pass large clots or tissue from your vagina.  Your bleeding increases.  You feel light-headed or weak, or you have fainting episodes.  You have chills.  You are leaking fluid or have a gush of fluid from your vagina.  You pass out while having a bowel movement. MAKE SURE YOU:  Understand these instructions.  Will watch your condition.  Will get help right away if you are not doing well or get worse.   This information is not intended to replace advice given to you by your health care provider. Make sure you discuss any questions you have with your health care provider.   Document Released: 02/20/2005 Document Revised: 05/18/2013 Document Reviewed: 01/18/2013 Elsevier Interactive Patient Education 2016 [employer].  Hypertension During Pregnancy Hypertension, or high blood pressure, is when there is extra pressure inside your blood vessels that carry blood from the heart to the rest of your body (arteries). It can happen at any time in life, including pregnancy. Hypertension during pregnancy can cause problems for you and your baby. Your baby might not weigh as much as he or she should at birth or might be born early (premature). Very bad cases of hypertension during pregnancy can  be life-threatening.  Different types of hypertension can occur during pregnancy. These include:  Chronic hypertension. This happens when a woman has hypertension before pregnancy and it continues during pregnancy.  Gestational hypertension. This is when hypertension develops during pregnancy.  Preeclampsia or toxemia of pregnancy. This is a very serious  type of hypertension that develops only during pregnancy. It affects the whole body and can be very dangerous for both mother and baby.  Gestational hypertension and preeclampsia usually go away after your baby is born. Your blood pressure will likely stabilize within 6 weeks. Women who have hypertension during pregnancy have a greater chance of developing hypertension later in life or with future pregnancies. RISK FACTORS There are certain factors that make it more likely for you to develop hypertension during pregnancy. These include:  Having hypertension before pregnancy.  Having hypertension during a previous pregnancy.  Being overweight.  Being older than 40 years.  Being pregnant with more than one baby.  Having diabetes or kidney problems. SIGNS AND SYMPTOMS Chronic and gestational hypertension rarely cause symptoms. Preeclampsia has symptoms, which may include:  Increased protein in your urine. Your health care provider will check for this at every prenatal visit.  Swelling of your hands and face.  Rapid weight gain.  Headaches.  Visual changes.  Being bothered by light.  Abdominal pain, especially in the upper right area.  Chest pain.  Shortness of breath.  Increased reflexes.  Seizures. These occur with a more severe form of preeclampsia, called eclampsia. DIAGNOSIS  You may be diagnosed with hypertension during a regular prenatal exam. At each prenatal visit, you may have:  Your blood pressure checked.  A urine test to check for protein in your urine. The type of hypertension you are diagnosed with depends on when you developed it. It also depends on your specific blood pressure reading.  Developing hypertension before 20 weeks of pregnancy is consistent with chronic hypertension.  Developing hypertension after 20 weeks of pregnancy is consistent with gestational hypertension.  Hypertension with increased urinary protein is diagnosed as  preeclampsia.  Blood pressure measurements that stay above [id number] systolic or [id number] diastolic are a sign of severe preeclampsia. TREATMENT Treatment for hypertension during pregnancy varies. Treatment depends on the type of hypertension and how serious it is.  If you take medicine for chronic hypertension, you may need to switch medicines.  Medicines called ACE inhibitors should not be taken during pregnancy.  Low-dose aspirin may be suggested for women who have risk factors for preeclampsia.  If you have gestational hypertension, you may need to take a blood pressure medicine that is safe during pregnancy. Your health care provider will recommend the correct medicine.  If you have severe preeclampsia, you may need to be in the hospital. Health care providers will watch you and your baby very closely. You also may need to take medicine called magnesium sulfate to prevent seizures and lower blood pressure.  Sometimes, an early delivery is needed. This may be the case if the condition worsens. It would be done to protect you and your baby. The only cure for preeclampsia is delivery.  Your health care provider may recommend that you take one low-dose aspirin (81 mg) each day to help prevent high blood pressure during your pregnancy if you are at risk for preeclampsia. You may be at risk for preeclampsia if:  You had preeclampsia or eclampsia during a previous pregnancy.  Your baby did not grow as expected during a previous pregnancy.  You  experienced preterm birth with a previous pregnancy.  You experienced a separation of the placenta from the uterus (placental abruption) during a previous pregnancy.  You experienced the loss of your baby during a previous pregnancy.  You are pregnant with more than one baby.  You have other medical conditions, such as diabetes or an autoimmune disease. HOME CARE INSTRUCTIONS  Schedule and keep all of your regular prenatal care appointments. This is  important.  Take medicines only as directed by your health care provider. Tell your health care provider about all medicines you take.  Eat as little salt as possible.  Get regular exercise.  Do not drink alcohol.  Do not use tobacco products.  Do not drink products with caffeine.  Lie on your left side when resting. SEEK IMMEDIATE MEDICAL CARE IF:  You have severe abdominal pain.  You have sudden swelling in your hands, ankles, or face.  You gain 4 pounds (1.8 kg) or more in 1 week.  You vomit repeatedly.  You have vaginal bleeding.  You do not feel your baby moving as much.  You have a headache.  You have blurred or double vision.  You have muscle twitching or spasms.  You have shortness of breath.  You have blue fingernails or lips.  You have blood in your urine. MAKE SURE YOU:  Understand these instructions.  Will watch your condition.  Will get help right away if you are not doing well or get worse.   This information is not intended to replace advice given to you by your health care provider. Make sure you discuss any questions you have with your health care provider.   Document Released: 01/29/2011 Document Revised: 06/03/2014 Document Reviewed: 12/10/2012 Elsevier Interactive Patient Education [employer].

## 2015-08-07 LAB — GC/CHLAMYDIA PROBE AMP (~~LOC~~) NOT AT ARMC
CHLAMYDIA, DNA PROBE: NEGATIVE
NEISSERIA GONORRHEA: NEGATIVE

## 2015-08-07 LAB — HIV ANTIBODY (ROUTINE TESTING W REFLEX): HIV SCREEN 4TH GENERATION: NONREACTIVE

## 2015-08-21 DIAGNOSIS — Z1151 Encounter for screening for human papillomavirus (HPV): Secondary | ICD-10-CM | POA: Diagnosis not present

## 2015-08-21 DIAGNOSIS — O10011 Pre-existing essential hypertension complicating pregnancy, first trimester: Secondary | ICD-10-CM | POA: Diagnosis not present

## 2015-08-21 DIAGNOSIS — O99211 Obesity complicating pregnancy, first trimester: Secondary | ICD-10-CM | POA: Diagnosis not present

## 2015-08-21 DIAGNOSIS — Z36 Encounter for antenatal screening of mother: Secondary | ICD-10-CM | POA: Diagnosis not present

## 2015-08-21 DIAGNOSIS — Z124 Encounter for screening for malignant neoplasm of cervix: Secondary | ICD-10-CM | POA: Diagnosis not present

## 2015-08-21 DIAGNOSIS — Z3A09 9 weeks gestation of pregnancy: Secondary | ICD-10-CM | POA: Diagnosis not present

## 2015-08-21 DIAGNOSIS — O34211 Maternal care for low transverse scar from previous cesarean delivery: Secondary | ICD-10-CM | POA: Diagnosis not present

## 2015-08-21 DIAGNOSIS — O24111 Pre-existing diabetes mellitus, type 2, in pregnancy, first trimester: Secondary | ICD-10-CM | POA: Diagnosis not present

## 2015-08-21 DIAGNOSIS — O26891 Other specified pregnancy related conditions, first trimester: Secondary | ICD-10-CM | POA: Diagnosis not present

## 2015-08-21 LAB — OB RESULTS CONSOLE ABO/RH: RH Type: POSITIVE

## 2015-08-21 LAB — OB RESULTS CONSOLE HIV ANTIBODY (ROUTINE TESTING): HIV: NONREACTIVE

## 2015-08-21 LAB — OB RESULTS CONSOLE RUBELLA ANTIBODY, IGM: Rubella: IMMUNE

## 2015-08-21 LAB — OB RESULTS CONSOLE ANTIBODY SCREEN: Antibody Screen: NEGATIVE

## 2015-08-21 LAB — OB RESULTS CONSOLE RPR: RPR: NONREACTIVE

## 2015-08-21 LAB — OB RESULTS CONSOLE HEPATITIS B SURFACE ANTIGEN: HEP B S AG: NEGATIVE

## 2015-08-21 MED FILL — METHYLDOPA 250 MG TABLET: 250 | 30 days supply | Qty: 120 | Fill #0

## 2015-09-06 DIAGNOSIS — Z3A11 11 weeks gestation of pregnancy: Secondary | ICD-10-CM | POA: Diagnosis not present

## 2015-09-06 DIAGNOSIS — O34211 Maternal care for low transverse scar from previous cesarean delivery: Secondary | ICD-10-CM | POA: Diagnosis not present

## 2015-09-06 DIAGNOSIS — O24111 Pre-existing diabetes mellitus, type 2, in pregnancy, first trimester: Secondary | ICD-10-CM | POA: Diagnosis not present

## 2015-09-06 DIAGNOSIS — O10011 Pre-existing essential hypertension complicating pregnancy, first trimester: Secondary | ICD-10-CM | POA: Diagnosis not present

## 2015-09-06 MED FILL — NIFEDIPINE ER 30 MG TABLET: 30 | 30 days supply | Qty: 30 | Fill #0

## 2015-09-20 DIAGNOSIS — O34211 Maternal care for low transverse scar from previous cesarean delivery: Secondary | ICD-10-CM | POA: Diagnosis not present

## 2015-09-20 DIAGNOSIS — O99211 Obesity complicating pregnancy, first trimester: Secondary | ICD-10-CM | POA: Diagnosis not present

## 2015-09-20 DIAGNOSIS — O10011 Pre-existing essential hypertension complicating pregnancy, first trimester: Secondary | ICD-10-CM | POA: Diagnosis not present

## 2015-09-20 DIAGNOSIS — O24111 Pre-existing diabetes mellitus, type 2, in pregnancy, first trimester: Secondary | ICD-10-CM | POA: Diagnosis not present

## 2015-09-20 DIAGNOSIS — Z3A13 13 weeks gestation of pregnancy: Secondary | ICD-10-CM | POA: Diagnosis not present

## 2015-10-03 ENCOUNTER — Inpatient Hospital Stay (HOSPITAL_COMMUNITY)
Admission: AD | Admit: 2015-10-03 | Discharge: 2015-10-04 | Disposition: A | Discharge status: Home / Self Care | Payer: 59 | Source: Ambulatory Visit | Attending: Obstetrics and Gynecology | Admitting: Obstetrics and Gynecology

## 2015-10-03 ENCOUNTER — Encounter (HOSPITAL_COMMUNITY): Payer: Self-pay | Admitting: *Deleted

## 2015-10-03 DIAGNOSIS — O9989 Other specified diseases and conditions complicating pregnancy, childbirth and the puerperium: Secondary | ICD-10-CM | POA: Diagnosis not present

## 2015-10-03 DIAGNOSIS — J45909 Unspecified asthma, uncomplicated: Secondary | ICD-10-CM | POA: Diagnosis not present

## 2015-10-03 DIAGNOSIS — Z7984 Long term (current) use of oral hypoglycemic drugs: Secondary | ICD-10-CM | POA: Insufficient documentation

## 2015-10-03 DIAGNOSIS — Z885 Allergy status to narcotic agent status: Secondary | ICD-10-CM | POA: Diagnosis not present

## 2015-10-03 DIAGNOSIS — O24112 Pre-existing diabetes mellitus, type 2, in pregnancy, second trimester: Secondary | ICD-10-CM | POA: Diagnosis not present

## 2015-10-03 DIAGNOSIS — E119 Type 2 diabetes mellitus without complications: Secondary | ICD-10-CM | POA: Insufficient documentation

## 2015-10-03 DIAGNOSIS — O26892 Other specified pregnancy related conditions, second trimester: Secondary | ICD-10-CM | POA: Diagnosis not present

## 2015-10-03 DIAGNOSIS — G4730 Sleep apnea, unspecified: Secondary | ICD-10-CM | POA: Diagnosis not present

## 2015-10-03 DIAGNOSIS — A084 Viral intestinal infection, unspecified: Secondary | ICD-10-CM | POA: Insufficient documentation

## 2015-10-03 DIAGNOSIS — I10 Essential (primary) hypertension: Secondary | ICD-10-CM | POA: Insufficient documentation

## 2015-10-03 DIAGNOSIS — R112 Nausea with vomiting, unspecified: Secondary | ICD-10-CM | POA: Diagnosis present

## 2015-10-03 DIAGNOSIS — Z3A15 15 weeks gestation of pregnancy: Secondary | ICD-10-CM | POA: Insufficient documentation

## 2015-10-03 DIAGNOSIS — Z91018 Allergy to other foods: Secondary | ICD-10-CM | POA: Insufficient documentation

## 2015-10-03 DIAGNOSIS — Z91010 Allergy to peanuts: Secondary | ICD-10-CM | POA: Insufficient documentation

## 2015-10-03 LAB — URINE MICROSCOPIC-ADD ON
RBC / HPF: NONE SEEN RBC/hpf (ref 0–5)
WBC, UA: NONE SEEN WBC/hpf (ref 0–5)

## 2015-10-03 LAB — URINALYSIS, ROUTINE W REFLEX MICROSCOPIC
BILIRUBIN URINE: NEGATIVE
Glucose, UA: NEGATIVE mg/dL
Hgb urine dipstick: NEGATIVE
Ketones, ur: 15 mg/dL — AB
Leukocytes, UA: NEGATIVE
NITRITE: NEGATIVE
Protein, ur: 100 mg/dL — AB
SPECIFIC GRAVITY, URINE: 1.025 (ref 1.005–1.030)
pH: 6 (ref 5.0–8.0)

## 2015-10-03 LAB — COMPREHENSIVE METABOLIC PANEL
ALBUMIN: 3.3 g/dL — AB (ref 3.5–5.0)
ALT: 12 U/L — ABNORMAL LOW (ref 14–54)
ANION GAP: 11 (ref 5–15)
AST: 14 U/L — ABNORMAL LOW (ref 15–41)
Alkaline Phosphatase: 39 U/L (ref 38–126)
BILIRUBIN TOTAL: 0.6 mg/dL (ref 0.3–1.2)
BUN: 8 mg/dL (ref 6–20)
CALCIUM: 8.8 mg/dL — AB (ref 8.9–10.3)
CO2: 21 mmol/L — AB (ref 22–32)
CREATININE: 0.39 mg/dL — AB (ref 0.44–1.00)
Chloride: 106 mmol/L (ref 101–111)
GLUCOSE: 103 mg/dL — AB (ref 65–99)
Potassium: 3.4 mmol/L — ABNORMAL LOW (ref 3.5–5.1)
Sodium: 138 mmol/L (ref 135–145)
TOTAL PROTEIN: 7.3 g/dL (ref 6.5–8.1)

## 2015-10-03 LAB — CBC WITH DIFFERENTIAL/PLATELET
BAND NEUTROPHILS: 0 %
BASOS ABS: 0 10*3/uL (ref 0.0–0.1)
Basophils Relative: 0 %
Blasts: 0 %
EOS PCT: 1 %
Eosinophils Absolute: 0 10*3/uL (ref 0.0–0.7)
HEMATOCRIT: 33.4 % — AB (ref 36.0–46.0)
Hemoglobin: 11.2 g/dL — ABNORMAL LOW (ref 12.0–15.0)
LYMPHS PCT: 11 %
Lymphs Abs: 0.5 10*3/uL — ABNORMAL LOW (ref 0.7–4.0)
MCH: 25.1 pg — ABNORMAL LOW (ref 26.0–34.0)
MCHC: 33.5 g/dL (ref 30.0–36.0)
MCV: 74.7 fL — ABNORMAL LOW (ref 78.0–100.0)
MYELOCYTES: 0 %
Metamyelocytes Relative: 0 %
Monocytes Absolute: 0.2 10*3/uL (ref 0.1–1.0)
Monocytes Relative: 5 %
NEUTROS PCT: 4 %
Neutro Abs: 3.6 10*3/uL (ref 1.7–7.7)
Platelets: 225 10*3/uL (ref 150–400)
Promyelocytes Absolute: 0 %
RBC: 4.47 MIL/uL (ref 3.87–5.11)
RDW: 15.2 % (ref 11.5–15.5)
WBC: 4.3 10*3/uL (ref 4.0–10.5)
nRBC: 0 /100 WBC

## 2015-10-03 LAB — LIPASE, BLOOD: Lipase: 25 U/L (ref 11–51)

## 2015-10-03 LAB — AMYLASE: Amylase: 66 U/L (ref 28–100)

## 2015-10-03 MED ORDER — PROMETHAZINE HCL 12.5 MG PO TABS: 12.5000 mg | ORAL_TABLET | Freq: Four times a day (QID) | ORAL | Status: DC | PRN

## 2015-10-03 MED ORDER — LACTATED RINGERS IV BOLUS (SEPSIS)
1000.0000 mL | Freq: Once | INTRAVENOUS | Status: AC
  Administered 2015-10-03: 1000 mL via INTRAVENOUS

## 2015-10-03 MED ORDER — ONDANSETRON HCL 4 MG/2ML IJ SOLN
4.0000 mg | Freq: Once | INTRAMUSCULAR | Status: AC
  Administered 2015-10-03: 4 mg via INTRAVENOUS
  Filled 2015-10-03: qty 2

## 2015-10-03 MED ORDER — ONDANSETRON HCL 4 MG PO TABS
4.0000 mg | ORAL_TABLET | Freq: Four times a day (QID) | ORAL | Status: DC
Start: 2015-10-03 — End: 2016-05-13
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
## 2015-10-03 NOTE — MAU Note (Signed)
PT SAYS SHE HAS HAD  SOME MORNING       SICKNESS -  BUT    TODAY HAS VOMITED  ALL DAY.     HAS UPPER ABD  CRAMPS.      HAD WATERY DIARRHEA TODAY   NO FEVER.

## 2015-10-03 NOTE — Discharge Instructions (Signed)
Viral Gastroenteritis Viral gastroenteritis is also called stomach flu. This illness is caused by a certain type of germ (virus). It can cause sudden watery poop (diarrhea) and throwing up (vomiting). This can cause you to lose body fluids (dehydration). This illness usually lasts for 3 to 8 days. It usually goes away on its own. HOME CARE   Drink enough fluids to keep your pee (urine) clear or pale yellow. Drink small amounts of fluids often.  Ask your doctor how to replace body fluid losses (rehydration).  Avoid:  Foods high in sugar.  Alcohol.  Bubbly (carbonated) drinks.  Tobacco.  Juice.  Caffeine drinks.  Very hot or cold fluids.  Fatty, greasy foods.  Eating too much at one time.  Dairy products until 24 to 48 hours after your watery poop stops.  You may eat foods with active cultures (probiotics). They can be found in some yogurts and supplements.  Wash your hands well to avoid spreading the illness.  Only take medicines as told by your doctor. Do not give aspirin to children. Do not take medicines for watery poop (antidiarrheals).  Ask your doctor if you should keep taking your regular medicines.  Keep all doctor visits as told. GET HELP RIGHT AWAY IF:   You cannot keep fluids down.  You do not pee at least once every 6 to 8 hours.  You are short of breath.  You see blood in your poop or throw up. This may look like coffee grounds.  You have belly (abdominal) pain that gets worse or is just in one small spot (localized).  You keep throwing up or having watery poop.  You have a fever.  The patient is a child younger than 3 months, and he or she has a fever.  The patient is a child older than 3 months, and he or she has a fever and problems that do not go away.  The patient is a child older than 3 months, and he or she has a fever and problems that suddenly get worse.  The patient is a baby, and he or she has no tears when crying. MAKE SURE YOU:     Understand these instructions.  Will watch your condition.  Will get help right away if you are not doing well or get worse.   This information is not intended to replace advice given to you by your health care provider. Make sure you discuss any questions you have with your health care provider.   Document Released: 10/30/2007 Document Revised: 08/05/2011 Document Reviewed: 02/27/2011 Elsevier Interactive Patient Education 2016 [place] Choices to Help Relieve Diarrhea, Adult When you have diarrhea, the foods you eat and your eating habits are very important. Choosing the right foods and drinks can help relieve diarrhea. Also, because diarrhea can last up to 7 days, you need to replace lost fluids and electrolytes (such as sodium, potassium, and chloride) in order to help prevent dehydration.  WHAT GENERAL GUIDELINES DO I NEED TO FOLLOW?  Slowly drink 1 cup (8 oz) of fluid for each episode of diarrhea. If you are getting enough fluid, your urine will be clear or pale yellow.  Eat starchy foods. Some good choices include white rice, white toast, pasta, low-fiber cereal, baked potatoes (without the skin), saltine crackers, and bagels.  Avoid large servings of any cooked vegetables.  Limit fruit to two servings per day. A serving is  cup or 1 small piece.  Choose foods with less than 2  g of fiber per serving.  Limit fats to less than 8 tsp (38 g) per day.  Avoid fried foods.  Eat foods that have probiotics in them. Probiotics can be found in certain dairy products.  Avoid foods and beverages that may increase the speed at which food moves through the stomach and intestines (gastrointestinal tract). Things to avoid include:  High-fiber foods, such as dried fruit, raw fruits and vegetables, nuts, seeds, and whole grain foods.  Spicy foods and high-fat foods.  Foods and beverages sweetened with high-fructose corn syrup, honey, or sugar alcohols such as xylitol,  sorbitol, and mannitol. WHAT FOODS ARE RECOMMENDED? Grains White rice. White, [place], or pita breads (fresh or toasted), including plain rolls, buns, or bagels. White pasta. Saltine, soda, or graham crackers. Pretzels. Low-fiber cereal. Cooked cereals made with water (such as cornmeal, farina, or cream cereals). Plain muffins. Matzo. Melba toast. Zwieback.  Vegetables Potatoes (without the skin). Strained tomato and vegetable juices. Most well-cooked and canned vegetables without seeds. Tender lettuce. Fruits Cooked or canned applesauce, apricots, cherries, fruit cocktail, grapefruit, peaches, pears, or plums. Fresh bananas, apples without skin, cherries, grapes, cantaloupe, grapefruit, peaches, oranges, or plums.  Meat and Other Protein Products Baked or boiled chicken. Eggs. Tofu. Fish. Seafood. Smooth peanut butter. Ground or well-cooked tender beef, ham, veal, lamb, pork, or poultry.  Dairy Plain yogurt, kefir, and unsweetened liquid yogurt. Lactose-free milk, buttermilk, or soy milk. Plain hard cheese. Beverages Sport drinks. Clear broths. Diluted fruit juices (except prune). Regular, caffeine-free sodas such as ginger ale. Water. Decaffeinated teas. Oral rehydration solutions. Sugar-free beverages not sweetened with sugar alcohols. Other Bouillon, broth, or soups made from recommended foods.  The items listed above may not be a complete list of recommended foods or beverages. Contact your dietitian for more options. WHAT FOODS ARE NOT RECOMMENDED? Grains Whole grain, whole wheat, bran, or rye breads, rolls, pastas, crackers, and cereals. Wild or brown rice. Cereals that contain more than 2 g of fiber per serving. Corn tortillas or taco shells. Cooked or dry oatmeal. Granola. Popcorn. Vegetables Raw vegetables. Cabbage, broccoli, Brussels sprouts, artichokes, baked beans, beet greens, corn, kale, legumes, peas, sweet potatoes, and yams. Potato skins. Cooked spinach and  cabbage. Fruits Dried fruit, including raisins and dates. Raw fruits. Stewed or dried prunes. Fresh apples with skin, apricots, mangoes, pears, raspberries, and strawberries.  Meat and Other Protein Products Chunky peanut butter. Nuts and seeds. Beans and lentils. Berniece Salines.  Dairy High-fat cheeses. Milk, chocolate milk, and beverages made with milk, such as milk shakes. Cream. Ice cream. Sweets and Desserts Sweet rolls, doughnuts, and sweet breads. Pancakes and waffles. Fats and Oils Butter. Cream sauces. Margarine. Salad oils. Plain salad dressings. Olives. Avocados.  Beverages Caffeinated beverages (such as coffee, tea, soda, or energy drinks). Alcoholic beverages. Fruit juices with pulp. Prune juice. Soft drinks sweetened with high-fructose corn syrup or sugar alcohols. Other Coconut. Hot sauce. Chili powder. Mayonnaise. Gravy. Cream-based or milk-based soups.  The items listed above may not be a complete list of foods and beverages to avoid. Contact your dietitian for more information. WHAT SHOULD I DO IF I BECOME DEHYDRATED? Diarrhea can sometimes lead to dehydration. Signs of dehydration include dark urine and dry mouth and skin. If you think you are dehydrated, you should rehydrate with an oral rehydration solution. These solutions can be purchased at pharmacies, retail stores, or online.  Drink -1 cup (120-240 mL) of oral rehydration solution each time you have an episode of diarrhea. If drinking this  amount makes your diarrhea worse, try drinking smaller amounts more often. For example, drink 1-3 tsp (5-15 mL) every 5-10 minutes.  A general rule for staying hydrated is to drink 1-2 L of fluid per day. Talk to your health care provider about the specific amount you should be drinking each day. Drink enough fluids to keep your urine clear or pale yellow.   This information is not intended to replace advice given to you by your health care provider. Make sure you discuss any questions you  have with your health care provider.   Document Released: 08/03/2003 Document Revised: 06/03/2014 Document Reviewed: 04/05/2013 Elsevier Interactive Patient Education [employer].

## 2015-10-04 DIAGNOSIS — O24112 Pre-existing diabetes mellitus, type 2, in pregnancy, second trimester: Secondary | ICD-10-CM | POA: Diagnosis not present

## 2015-10-04 DIAGNOSIS — Z7984 Long term (current) use of oral hypoglycemic drugs: Secondary | ICD-10-CM | POA: Diagnosis not present

## 2015-10-04 DIAGNOSIS — A084 Viral intestinal infection, unspecified: Secondary | ICD-10-CM | POA: Diagnosis not present

## 2015-10-04 DIAGNOSIS — G4730 Sleep apnea, unspecified: Secondary | ICD-10-CM | POA: Diagnosis not present

## 2015-10-04 DIAGNOSIS — I10 Essential (primary) hypertension: Secondary | ICD-10-CM | POA: Diagnosis not present

## 2015-10-04 DIAGNOSIS — Z3A15 15 weeks gestation of pregnancy: Secondary | ICD-10-CM | POA: Diagnosis not present

## 2015-10-04 DIAGNOSIS — J45909 Unspecified asthma, uncomplicated: Secondary | ICD-10-CM | POA: Diagnosis not present

## 2015-10-04 DIAGNOSIS — E119 Type 2 diabetes mellitus without complications: Secondary | ICD-10-CM | POA: Diagnosis not present

## 2015-10-04 DIAGNOSIS — O26892 Other specified pregnancy related conditions, second trimester: Secondary | ICD-10-CM | POA: Diagnosis not present

## 2015-10-13 MED FILL — metFORMIN HCL 850 MG TABS: 850 | 30 days supply | Qty: 60 | Fill #0

## 2015-10-24 DIAGNOSIS — O99212 Obesity complicating pregnancy, second trimester: Secondary | ICD-10-CM | POA: Diagnosis not present

## 2015-10-24 DIAGNOSIS — Z36 Encounter for antenatal screening of mother: Secondary | ICD-10-CM | POA: Diagnosis not present

## 2015-10-24 DIAGNOSIS — O34211 Maternal care for low transverse scar from previous cesarean delivery: Secondary | ICD-10-CM | POA: Diagnosis not present

## 2015-10-24 DIAGNOSIS — Z3A18 18 weeks gestation of pregnancy: Secondary | ICD-10-CM | POA: Diagnosis not present

## 2015-11-13 MED FILL — NIFEDIPINE ER 30 MG TABLET: 30 | 30 days supply | Qty: 30 | Fill #1

## 2015-11-13 MED FILL — metFORMIN HCL 850 MG TABS: 850 | 30 days supply | Qty: 60 | Fill #1

## 2015-12-08 DIAGNOSIS — O3660X0 Maternal care for excessive fetal growth, unspecified trimester, not applicable or unspecified: Secondary | ICD-10-CM | POA: Diagnosis not present

## 2015-12-08 DIAGNOSIS — O10012 Pre-existing essential hypertension complicating pregnancy, second trimester: Secondary | ICD-10-CM | POA: Diagnosis not present

## 2015-12-08 DIAGNOSIS — Z3A25 25 weeks gestation of pregnancy: Secondary | ICD-10-CM | POA: Diagnosis not present

## 2015-12-08 DIAGNOSIS — O99212 Obesity complicating pregnancy, second trimester: Secondary | ICD-10-CM | POA: Diagnosis not present

## 2015-12-25 MED FILL — NIFEDIPINE ER 30 MG TABLET: 30 | 30 days supply | Qty: 30 | Fill #2

## 2015-12-25 MED FILL — metFORMIN HCL 850 MG TABS: 850 | 30 days supply | Qty: 60 | Fill #2

## 2016-01-05 DIAGNOSIS — O3660X0 Maternal care for excessive fetal growth, unspecified trimester, not applicable or unspecified: Secondary | ICD-10-CM | POA: Diagnosis not present

## 2016-01-05 DIAGNOSIS — O24113 Pre-existing diabetes mellitus, type 2, in pregnancy, third trimester: Secondary | ICD-10-CM | POA: Diagnosis not present

## 2016-01-05 DIAGNOSIS — Z3A29 29 weeks gestation of pregnancy: Secondary | ICD-10-CM | POA: Diagnosis not present

## 2016-01-30 DIAGNOSIS — O10013 Pre-existing essential hypertension complicating pregnancy, third trimester: Secondary | ICD-10-CM | POA: Diagnosis not present

## 2016-01-30 DIAGNOSIS — Z3A32 32 weeks gestation of pregnancy: Secondary | ICD-10-CM | POA: Diagnosis not present

## 2016-01-30 DIAGNOSIS — O24113 Pre-existing diabetes mellitus, type 2, in pregnancy, third trimester: Secondary | ICD-10-CM | POA: Diagnosis not present

## 2016-01-30 MED FILL — NIFEDIPINE ER 30 MG TABLET: 30 | 30 days supply | Qty: 30 | Fill #3

## 2016-01-30 MED FILL — TRUEplus LANCETS 30G MISC: 25 days supply | Qty: 100 | Fill #0

## 2016-01-30 MED FILL — TRUE METRIX GLUCOSE TEST ST: 25 days supply | Qty: 100 | Fill #0

## 2016-01-30 MED FILL — metFORMIN HCL 850 MG TABS: 850 | 30 days supply | Qty: 60 | Fill #3

## 2016-02-02 DIAGNOSIS — O10013 Pre-existing essential hypertension complicating pregnancy, third trimester: Secondary | ICD-10-CM | POA: Diagnosis not present

## 2016-02-02 DIAGNOSIS — Z3A33 33 weeks gestation of pregnancy: Secondary | ICD-10-CM | POA: Diagnosis not present

## 2016-02-02 DIAGNOSIS — O99213 Obesity complicating pregnancy, third trimester: Secondary | ICD-10-CM | POA: Diagnosis not present

## 2016-02-02 DIAGNOSIS — O24113 Pre-existing diabetes mellitus, type 2, in pregnancy, third trimester: Secondary | ICD-10-CM | POA: Diagnosis not present

## 2016-02-08 DIAGNOSIS — Z3A33 33 weeks gestation of pregnancy: Secondary | ICD-10-CM | POA: Diagnosis not present

## 2016-02-08 DIAGNOSIS — O10013 Pre-existing essential hypertension complicating pregnancy, third trimester: Secondary | ICD-10-CM | POA: Diagnosis not present

## 2016-02-08 DIAGNOSIS — O24113 Pre-existing diabetes mellitus, type 2, in pregnancy, third trimester: Secondary | ICD-10-CM | POA: Diagnosis not present

## 2016-02-12 DIAGNOSIS — O10913 Unspecified pre-existing hypertension complicating pregnancy, third trimester: Secondary | ICD-10-CM | POA: Diagnosis not present

## 2016-02-12 DIAGNOSIS — Z23 Encounter for immunization: Secondary | ICD-10-CM | POA: Diagnosis not present

## 2016-02-12 DIAGNOSIS — O34211 Maternal care for low transverse scar from previous cesarean delivery: Secondary | ICD-10-CM | POA: Diagnosis not present

## 2016-02-12 DIAGNOSIS — O24113 Pre-existing diabetes mellitus, type 2, in pregnancy, third trimester: Secondary | ICD-10-CM | POA: Diagnosis not present

## 2016-02-12 DIAGNOSIS — Z3A34 34 weeks gestation of pregnancy: Secondary | ICD-10-CM | POA: Diagnosis not present

## 2016-02-12 DIAGNOSIS — O10013 Pre-existing essential hypertension complicating pregnancy, third trimester: Secondary | ICD-10-CM | POA: Diagnosis not present

## 2016-02-16 DIAGNOSIS — O24113 Pre-existing diabetes mellitus, type 2, in pregnancy, third trimester: Secondary | ICD-10-CM | POA: Diagnosis not present

## 2016-02-16 DIAGNOSIS — Z3A35 35 weeks gestation of pregnancy: Secondary | ICD-10-CM | POA: Diagnosis not present

## 2016-02-17 ENCOUNTER — Inpatient Hospital Stay (HOSPITAL_COMMUNITY)
Admission: AD | Admit: 2016-02-17 | Discharge: 2016-02-17 | Disposition: A | Discharge status: Home / Self Care | Payer: 59 | Source: Ambulatory Visit | Attending: Obstetrics and Gynecology | Admitting: Obstetrics and Gynecology

## 2016-02-17 ENCOUNTER — Encounter (HOSPITAL_COMMUNITY): Payer: Self-pay | Admitting: *Deleted

## 2016-02-17 DIAGNOSIS — O24113 Pre-existing diabetes mellitus, type 2, in pregnancy, third trimester: Secondary | ICD-10-CM | POA: Diagnosis not present

## 2016-02-17 DIAGNOSIS — E119 Type 2 diabetes mellitus without complications: Secondary | ICD-10-CM | POA: Diagnosis not present

## 2016-02-17 DIAGNOSIS — Z3A35 35 weeks gestation of pregnancy: Secondary | ICD-10-CM | POA: Diagnosis not present

## 2016-02-17 DIAGNOSIS — O4703 False labor before 37 completed weeks of gestation, third trimester: Secondary | ICD-10-CM

## 2016-02-17 DIAGNOSIS — O34219 Maternal care for unspecified type scar from previous cesarean delivery: Secondary | ICD-10-CM | POA: Insufficient documentation

## 2016-02-17 DIAGNOSIS — Z7984 Long term (current) use of oral hypoglycemic drugs: Secondary | ICD-10-CM | POA: Diagnosis not present

## 2016-02-17 DIAGNOSIS — Z3689 Encounter for other specified antenatal screening: Secondary | ICD-10-CM

## 2016-02-17 LAB — URINE MICROSCOPIC-ADD ON
RBC / HPF: NONE SEEN RBC/hpf (ref 0–5)
WBC UA: NONE SEEN WBC/hpf (ref 0–5)

## 2016-02-17 LAB — URINALYSIS, ROUTINE W REFLEX MICROSCOPIC
Bilirubin Urine: NEGATIVE
Glucose, UA: NEGATIVE mg/dL
Hgb urine dipstick: NEGATIVE
Ketones, ur: NEGATIVE mg/dL
NITRITE: NEGATIVE
PROTEIN: NEGATIVE mg/dL
Specific Gravity, Urine: 1.02 (ref 1.005–1.030)
pH: 6.5 (ref 5.0–8.0)

## 2016-02-17 MED ORDER — LACTATED RINGERS IV BOLUS (SEPSIS)
1000.0000 mL | Freq: Once | INTRAVENOUS | Status: AC
  Administered 2016-02-17: 1000 mL via INTRAVENOUS

## 2016-02-17 NOTE — Discharge Instructions (Signed)
Braxton Hicks Contractions °Contractions of the uterus can occur throughout pregnancy. Contractions are not always a sign that you are in labor.  °WHAT ARE BRAXTON HICKS CONTRACTIONS?  °Contractions that occur before labor are called Braxton Hicks contractions, or false labor. Toward the end of pregnancy (32-34 weeks), these contractions can develop more often and may become more forceful. This is not true labor because these contractions do not result in opening (dilatation) and thinning of the cervix. They are sometimes difficult to tell apart from true labor because these contractions can be forceful and people have different pain tolerances. You should not feel embarrassed if you go to the hospital with false labor. Sometimes, the only way to tell if you are in true labor is for your health care provider to look for changes in the cervix. °If there are no prenatal problems or other health problems associated with the pregnancy, it is completely safe to be sent home with false labor and await the onset of true labor. °HOW CAN YOU TELL THE DIFFERENCE BETWEEN TRUE AND FALSE LABOR? °False Labor °· The contractions of false labor are usually shorter and not as hard as those of true labor.   °· The contractions are usually irregular.   °· The contractions are often felt in the front of the lower abdomen and in the groin.   °· The contractions may go away when you walk around or change positions while lying down.   °· The contractions get weaker and are shorter lasting as time goes on.   °· The contractions do not usually become progressively stronger, regular, and closer together as with true labor.   °True Labor °· Contractions in true labor last 30-70 seconds, become very regular, usually become more intense, and increase in frequency.   °· The contractions do not go away with walking.   °· The discomfort is usually felt in the top of the uterus and spreads to the lower abdomen and low back.   °· True labor can be  determined by your health care provider with an exam. This will show that the cervix is dilating and getting thinner.   °WHAT TO REMEMBER °· Keep up with your usual exercises and follow other instructions given by your health care provider.   °· Take medicines as directed by your health care provider.   °· Keep your regular prenatal appointments.   °· Eat and drink lightly if you think you are going into labor.   °· If Braxton Hicks contractions are making you uncomfortable:   °¨ Change your position from lying down or resting to walking, or from walking to resting.   °¨ Sit and rest in a tub of warm water.   °¨ Drink 2-3 glasses of water. Dehydration may cause these contractions.   °¨ Do slow and deep breathing several times an hour.   °WHEN SHOULD I SEEK IMMEDIATE MEDICAL CARE? °Seek immediate medical care if: °· Your contractions become stronger, more regular, and closer together.   °· You have fluid leaking or gushing from your vagina.   °· You have a fever.   °· You pass blood-tinged mucus.   °· You have vaginal bleeding.   °· You have continuous abdominal pain.   °· You have low back pain that you never had before.   °· You feel your baby's head pushing down and causing pelvic pressure.   °· Your baby is not moving as much as it used to.   °  °This information is not intended to replace advice given to you by your health care provider. Make sure you discuss any questions you have with your health care   provider. °  °Document Released: 05/13/2005 Document Revised: 05/18/2013 Document Reviewed: 02/22/2013 °Elsevier Interactive Patient Education ©2016 Elsevier Inc. ° °
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
## 2016-02-17 NOTE — MAU Note (Signed)
contractions since morning

## 2016-02-22 DIAGNOSIS — Z36 Encounter for antenatal screening of mother: Secondary | ICD-10-CM | POA: Diagnosis not present

## 2016-02-22 DIAGNOSIS — O24113 Pre-existing diabetes mellitus, type 2, in pregnancy, third trimester: Secondary | ICD-10-CM | POA: Diagnosis not present

## 2016-02-22 DIAGNOSIS — O10013 Pre-existing essential hypertension complicating pregnancy, third trimester: Secondary | ICD-10-CM | POA: Diagnosis not present

## 2016-02-22 DIAGNOSIS — Z3A35 35 weeks gestation of pregnancy: Secondary | ICD-10-CM | POA: Diagnosis not present

## 2016-02-22 LAB — OB RESULTS CONSOLE GBS: GBS: NEGATIVE

## 2016-02-27 DIAGNOSIS — O10013 Pre-existing essential hypertension complicating pregnancy, third trimester: Secondary | ICD-10-CM | POA: Diagnosis not present

## 2016-02-27 DIAGNOSIS — Z3A36 36 weeks gestation of pregnancy: Secondary | ICD-10-CM | POA: Diagnosis not present

## 2016-02-27 DIAGNOSIS — O24113 Pre-existing diabetes mellitus, type 2, in pregnancy, third trimester: Secondary | ICD-10-CM | POA: Diagnosis not present

## 2016-02-27 DIAGNOSIS — R8299 Other abnormal findings in urine: Secondary | ICD-10-CM | POA: Diagnosis not present

## 2016-02-29 ENCOUNTER — Encounter (HOSPITAL_COMMUNITY): Payer: Self-pay

## 2016-02-29 ENCOUNTER — Telehealth (HOSPITAL_COMMUNITY): Payer: Self-pay | Admitting: *Deleted

## 2016-02-29 DIAGNOSIS — Z3A36 36 weeks gestation of pregnancy: Secondary | ICD-10-CM | POA: Diagnosis not present

## 2016-02-29 DIAGNOSIS — O10013 Pre-existing essential hypertension complicating pregnancy, third trimester: Secondary | ICD-10-CM | POA: Diagnosis not present

## 2016-02-29 DIAGNOSIS — O24113 Pre-existing diabetes mellitus, type 2, in pregnancy, third trimester: Secondary | ICD-10-CM | POA: Diagnosis not present

## 2016-02-29 NOTE — Telephone Encounter (Signed)
Preadmission screen  

## 2016-03-01 ENCOUNTER — Encounter (HOSPITAL_COMMUNITY): Payer: Self-pay

## 2016-03-01 ENCOUNTER — Inpatient Hospital Stay (HOSPITAL_COMMUNITY)
Admission: AD | Admit: 2016-03-01 | Discharge: 2016-03-01 | Disposition: A | Discharge status: Home / Self Care | Payer: 59 | Source: Ambulatory Visit | Attending: Obstetrics and Gynecology | Admitting: Obstetrics and Gynecology

## 2016-03-01 DIAGNOSIS — Z3A37 37 weeks gestation of pregnancy: Secondary | ICD-10-CM | POA: Insufficient documentation

## 2016-03-01 DIAGNOSIS — Z3493 Encounter for supervision of normal pregnancy, unspecified, third trimester: Secondary | ICD-10-CM | POA: Insufficient documentation

## 2016-03-01 LAB — URINALYSIS, ROUTINE W REFLEX MICROSCOPIC
Bilirubin Urine: NEGATIVE
Glucose, UA: NEGATIVE mg/dL
Hgb urine dipstick: NEGATIVE
LEUKOCYTES UA: NEGATIVE
NITRITE: NEGATIVE
PROTEIN: NEGATIVE mg/dL
Specific Gravity, Urine: 1.02 (ref 1.005–1.030)
pH: 6.5 (ref 5.0–8.0)

## 2016-03-01 LAB — CBC WITH DIFFERENTIAL/PLATELET
Basophils Absolute: 0 10*3/uL (ref 0.0–0.1)
Basophils Relative: 0 %
EOS ABS: 0.1 10*3/uL (ref 0.0–0.7)
EOS PCT: 1 %
HCT: 32.6 % — ABNORMAL LOW (ref 36.0–46.0)
Hemoglobin: 11.3 g/dL — ABNORMAL LOW (ref 12.0–15.0)
LYMPHS ABS: 2.2 10*3/uL (ref 0.7–4.0)
Lymphocytes Relative: 30 %
MCH: 26.6 pg (ref 26.0–34.0)
MCHC: 34.7 g/dL (ref 30.0–36.0)
MCV: 76.7 fL — ABNORMAL LOW (ref 78.0–100.0)
Monocytes Absolute: 0.4 10*3/uL (ref 0.1–1.0)
Monocytes Relative: 5 %
Neutro Abs: 4.6 10*3/uL (ref 1.7–7.7)
Neutrophils Relative %: 64 %
PLATELETS: 249 10*3/uL (ref 150–400)
RBC: 4.25 MIL/uL (ref 3.87–5.11)
RDW: 15.2 % (ref 11.5–15.5)
WBC: 7.2 10*3/uL (ref 4.0–10.5)

## 2016-03-01 LAB — COMPREHENSIVE METABOLIC PANEL
ALT: 16 U/L (ref 14–54)
ANION GAP: 10 (ref 5–15)
AST: 14 U/L — ABNORMAL LOW (ref 15–41)
Albumin: 3 g/dL — ABNORMAL LOW (ref 3.5–5.0)
Alkaline Phosphatase: 97 U/L (ref 38–126)
BUN: 8 mg/dL (ref 6–20)
CHLORIDE: 106 mmol/L (ref 101–111)
CO2: 18 mmol/L — AB (ref 22–32)
Calcium: 9.3 mg/dL (ref 8.9–10.3)
Creatinine, Ser: 0.49 mg/dL (ref 0.44–1.00)
GFR calc non Af Amer: >60 mL/min (ref >60)
Glucose, Bld: 88 mg/dL (ref 65–99)
POTASSIUM: 3.6 mmol/L (ref 3.5–5.1)
SODIUM: 134 mmol/L — AB (ref 135–145)
Total Bilirubin: 0.5 mg/dL (ref 0.3–1.2)
Total Protein: 6.5 g/dL (ref 6.5–8.1)

## 2016-03-01 LAB — PROTEIN / CREATININE RATIO, URINE
Creatinine, Urine: 107 mg/dL
Protein Creatinine Ratio: 0.3 mg/mg{Cre} — ABNORMAL HIGH (ref 0.00–0.15)
TOTAL PROTEIN, URINE: 32 mg/dL

## 2016-03-01 LAB — GLUCOSE, CAPILLARY: GLUCOSE-CAPILLARY: 84 mg/dL (ref 65–99)

## 2016-03-01 MED ORDER — LACTATED RINGERS IV BOLUS (SEPSIS)
1000.0000 mL | Freq: Once | INTRAVENOUS | Status: AC
  Administered 2016-03-01: 1000 mL via INTRAVENOUS

## 2016-03-01 MED FILL — metFORMIN HCL 850 MG TABS: 850 | 30 days supply | Qty: 60 | Fill #4

## 2016-03-01 MED FILL — NIFEDIPINE ER 30 MG TABLET: 30 | 30 days supply | Qty: 30 | Fill #4

## 2016-03-01 NOTE — MAU Note (Signed)
Pt having ctx since this morning. Sharp vaginal pain when she has a contraction.

## 2016-03-01 NOTE — MAU Note (Signed)
2nd bag of LR started

## 2016-03-04 ENCOUNTER — Telehealth (HOSPITAL_COMMUNITY): Payer: Self-pay | Admitting: *Deleted

## 2016-03-04 ENCOUNTER — Encounter (HOSPITAL_COMMUNITY): Payer: Self-pay

## 2016-03-04 DIAGNOSIS — Z3A37 37 weeks gestation of pregnancy: Secondary | ICD-10-CM | POA: Diagnosis not present

## 2016-03-04 DIAGNOSIS — O10013 Pre-existing essential hypertension complicating pregnancy, third trimester: Secondary | ICD-10-CM | POA: Diagnosis not present

## 2016-03-04 DIAGNOSIS — O24113 Pre-existing diabetes mellitus, type 2, in pregnancy, third trimester: Secondary | ICD-10-CM | POA: Diagnosis not present

## 2016-03-04 NOTE — Telephone Encounter (Signed)
Preadmission screen  

## 2016-03-06 DIAGNOSIS — O10013 Pre-existing essential hypertension complicating pregnancy, third trimester: Secondary | ICD-10-CM | POA: Diagnosis not present

## 2016-03-06 DIAGNOSIS — O24113 Pre-existing diabetes mellitus, type 2, in pregnancy, third trimester: Secondary | ICD-10-CM | POA: Diagnosis not present

## 2016-03-06 DIAGNOSIS — Z3A37 37 weeks gestation of pregnancy: Secondary | ICD-10-CM | POA: Diagnosis not present

## 2016-03-07 ENCOUNTER — Encounter (HOSPITAL_COMMUNITY)
Admission: RE | Admit: 2016-03-07 | Discharge: 2016-03-07 | Disposition: A | Discharge status: Home / Self Care | Payer: 59 | Source: Ambulatory Visit | Attending: Obstetrics and Gynecology | Admitting: Obstetrics and Gynecology

## 2016-03-07 DIAGNOSIS — O2412 Pre-existing diabetes mellitus, type 2, in childbirth: Secondary | ICD-10-CM | POA: Diagnosis not present

## 2016-03-07 DIAGNOSIS — O99214 Obesity complicating childbirth: Secondary | ICD-10-CM | POA: Diagnosis not present

## 2016-03-07 DIAGNOSIS — E119 Type 2 diabetes mellitus without complications: Secondary | ICD-10-CM | POA: Diagnosis not present

## 2016-03-07 DIAGNOSIS — Z3A38 38 weeks gestation of pregnancy: Secondary | ICD-10-CM | POA: Diagnosis not present

## 2016-03-07 DIAGNOSIS — O99824 Streptococcus B carrier state complicating childbirth: Secondary | ICD-10-CM | POA: Diagnosis not present

## 2016-03-07 DIAGNOSIS — O1002 Pre-existing essential hypertension complicating childbirth: Secondary | ICD-10-CM | POA: Diagnosis not present

## 2016-03-07 DIAGNOSIS — Z7984 Long term (current) use of oral hypoglycemic drugs: Secondary | ICD-10-CM | POA: Diagnosis not present

## 2016-03-07 DIAGNOSIS — R21 Rash and other nonspecific skin eruption: Secondary | ICD-10-CM | POA: Diagnosis not present

## 2016-03-07 DIAGNOSIS — O34211 Maternal care for low transverse scar from previous cesarean delivery: Secondary | ICD-10-CM | POA: Diagnosis not present

## 2016-03-07 DIAGNOSIS — Z6841 Body Mass Index (BMI) 40.0 and over, adult: Secondary | ICD-10-CM | POA: Diagnosis not present

## 2016-03-07 HISTORY — DX: Adverse effect of unspecified anesthetic, initial encounter: T41.45XA

## 2016-03-07 HISTORY — DX: Other complications of anesthesia, initial encounter: T88.59XA

## 2016-03-07 LAB — BASIC METABOLIC PANEL
Anion gap: 7 (ref 5–15)
BUN: 8 mg/dL (ref 6–20)
CHLORIDE: 104 mmol/L (ref 101–111)
CO2: 21 mmol/L — AB (ref 22–32)
CREATININE: 0.53 mg/dL (ref 0.44–1.00)
Calcium: 8.8 mg/dL — ABNORMAL LOW (ref 8.9–10.3)
GFR calc Af Amer: >60 mL/min (ref >60)
GFR calc non Af Amer: >60 mL/min (ref >60)
GLUCOSE: 151 mg/dL — AB (ref 65–99)
Potassium: 3.5 mmol/L (ref 3.5–5.1)
SODIUM: 132 mmol/L — AB (ref 135–145)

## 2016-03-07 LAB — CBC
HEMATOCRIT: 31.6 % — AB (ref 36.0–46.0)
Hemoglobin: 11 g/dL — ABNORMAL LOW (ref 12.0–15.0)
MCH: 26.7 pg (ref 26.0–34.0)
MCHC: 34.8 g/dL (ref 30.0–36.0)
MCV: 76.7 fL — AB (ref 78.0–100.0)
PLATELETS: 225 10*3/uL (ref 150–400)
RBC: 4.12 MIL/uL (ref 3.87–5.11)
RDW: 15.2 % (ref 11.5–15.5)
WBC: 5.4 10*3/uL (ref 4.0–10.5)

## 2016-03-07 MED ORDER — DEXTROSE 5 % IV SOLN
3.0000 g | INTRAVENOUS | Status: AC
  Administered 2016-03-08: 3 g via INTRAVENOUS
  Filled 2016-03-07: qty 3000

## 2016-03-07 NOTE — Patient Instructions (Signed)
20 LAVEDA HUPP  03/07/2016   Your procedure is scheduled on:  03/08/2016  Enter through the Main Entrance of [facility] at [place] up the phone at the desk and dial [phone].   Call this number if you have problems the morning of surgery: [phone]   Remember:   Do not eat food:After Midnight.  Do not drink clear liquids: After Midnight.  Take these medicines the morning of surgery with A SIP OF WATER: procardia and albuterol,  Bring inhaler with you.  DO NOT TAKE METFORMIN TONIGHT OR IN THE MORNING   Do not wear jewelry, make-up or nail polish.  Do not wear lotions, powders, or perfumes. Do not wear deodorant.  Do not shave 48 hours prior to surgery.  Do not bring valuables to the hospital.  [facility] is not   responsible for any belongings or valuables brought to the hospital.  Contacts, dentures or bridgework may not be worn into surgery.  Leave suitcase in the car. After surgery it may be brought to your room.  For patients admitted to the hospital, checkout time is 11:00 AM the day of              discharge.   Patients discharged the day of surgery will not be allowed to drive             home.  Name and phone number of your driver: NA  Special Instructions:   N/A   Please read over the following fact sheets that you were given:   Surgical Site Infection Prevention
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
## 2016-03-08 ENCOUNTER — Ambulatory Visit (HOSPITAL_COMMUNITY)
Admission: RE | Admit: 2016-03-08 | Discharge: 2016-03-11 | Disposition: A | Discharge status: Home / Self Care | Payer: 59 | Source: Ambulatory Visit | Attending: Obstetrics and Gynecology | Admitting: Obstetrics and Gynecology

## 2016-03-08 ENCOUNTER — Inpatient Hospital Stay (HOSPITAL_COMMUNITY): Payer: 59 | Admitting: Anesthesiology

## 2016-03-08 ENCOUNTER — Encounter (HOSPITAL_COMMUNITY)
Admission: RE | Disposition: A | Discharge status: Home / Self Care | Payer: Self-pay | Source: Ambulatory Visit | Attending: Obstetrics and Gynecology

## 2016-03-08 ENCOUNTER — Encounter (HOSPITAL_COMMUNITY): Payer: Self-pay

## 2016-03-08 DIAGNOSIS — O2412 Pre-existing diabetes mellitus, type 2, in childbirth: Secondary | ICD-10-CM | POA: Insufficient documentation

## 2016-03-08 DIAGNOSIS — O34211 Maternal care for low transverse scar from previous cesarean delivery: Secondary | ICD-10-CM | POA: Diagnosis not present

## 2016-03-08 DIAGNOSIS — O24313 Unspecified pre-existing diabetes mellitus in pregnancy, third trimester: Secondary | ICD-10-CM | POA: Diagnosis not present

## 2016-03-08 DIAGNOSIS — Z3A38 38 weeks gestation of pregnancy: Secondary | ICD-10-CM | POA: Insufficient documentation

## 2016-03-08 DIAGNOSIS — O99214 Obesity complicating childbirth: Secondary | ICD-10-CM | POA: Insufficient documentation

## 2016-03-08 DIAGNOSIS — O1002 Pre-existing essential hypertension complicating childbirth: Secondary | ICD-10-CM | POA: Insufficient documentation

## 2016-03-08 DIAGNOSIS — R21 Rash and other nonspecific skin eruption: Secondary | ICD-10-CM | POA: Diagnosis not present

## 2016-03-08 DIAGNOSIS — E119 Type 2 diabetes mellitus without complications: Secondary | ICD-10-CM | POA: Insufficient documentation

## 2016-03-08 DIAGNOSIS — Z98891 History of uterine scar from previous surgery: Secondary | ICD-10-CM

## 2016-03-08 DIAGNOSIS — Z7984 Long term (current) use of oral hypoglycemic drugs: Secondary | ICD-10-CM | POA: Diagnosis not present

## 2016-03-08 DIAGNOSIS — O99824 Streptococcus B carrier state complicating childbirth: Secondary | ICD-10-CM | POA: Insufficient documentation

## 2016-03-08 DIAGNOSIS — O10913 Unspecified pre-existing hypertension complicating pregnancy, third trimester: Secondary | ICD-10-CM | POA: Diagnosis not present

## 2016-03-08 DIAGNOSIS — Z6841 Body Mass Index (BMI) 40.0 and over, adult: Secondary | ICD-10-CM | POA: Insufficient documentation

## 2016-03-08 LAB — GLUCOSE, CAPILLARY
GLUCOSE-CAPILLARY: 140 mg/dL — AB (ref 65–99)
GLUCOSE-CAPILLARY: 87 mg/dL (ref 65–99)
GLUCOSE-CAPILLARY: 87 mg/dL (ref 65–99)
Glucose-Capillary: 112 mg/dL — ABNORMAL HIGH (ref 65–99)
Glucose-Capillary: 94 mg/dL (ref 65–99)

## 2016-03-08 LAB — PREPARE RBC (CROSSMATCH)

## 2016-03-08 LAB — RPR: RPR: NONREACTIVE

## 2016-03-08 SURGERY — Surgical Case
Anesthesia: Spinal

## 2016-03-08 MED ORDER — SCOPOLAMINE 1 MG/3DAYS TD PT72: 1.0000 | MEDICATED_PATCH | Freq: Once | TRANSDERMAL | Status: DC

## 2016-03-08 MED ORDER — MEPERIDINE HCL 25 MG/ML IJ SOLN: 6.2500 mg | INTRAMUSCULAR | Status: DC | PRN

## 2016-03-08 MED ORDER — ONDANSETRON HCL 4 MG/2ML IJ SOLN
INTRAMUSCULAR | Status: DC | PRN
  Administered 2016-03-08: 4 mg via INTRAVENOUS

## 2016-03-08 MED ORDER — MORPHINE SULFATE-NACL 0.5-0.9 MG/ML-% IV SOSY
PREFILLED_SYRINGE | INTRAVENOUS | Status: AC
  Filled 2016-03-08: qty 1

## 2016-03-08 MED ORDER — ALUM & MAG HYDROXIDE-SIMETH 200-200-20 MG/5ML PO SUSP: 30.0000 mL | ORAL | Status: DC | PRN

## 2016-03-08 MED ORDER — OXYCODONE-ACETAMINOPHEN 5-325 MG PO TABS
1.0000 | ORAL_TABLET | ORAL | Status: DC | PRN
  Administered 2016-03-09 – 2016-03-11 (×7): 1 via ORAL
  Filled 2016-03-08 (×7): qty 1

## 2016-03-08 MED ORDER — KETOROLAC TROMETHAMINE 30 MG/ML IJ SOLN
30.0000 mg | Freq: Four times a day (QID) | INTRAMUSCULAR | Status: DC | PRN
  Administered 2016-03-08: 30 mg via INTRAMUSCULAR

## 2016-03-08 MED ORDER — ALBUTEROL SULFATE (2.5 MG/3ML) 0.083% IN NEBU: 3.0000 mL | INHALATION_SOLUTION | Freq: Four times a day (QID) | RESPIRATORY_TRACT | Status: DC | PRN

## 2016-03-08 MED ORDER — ACETAMINOPHEN 500 MG PO TABS: 1000.0000 mg | ORAL_TABLET | Freq: Four times a day (QID) | ORAL | Status: DC

## 2016-03-08 MED ORDER — LACTATED RINGERS IV SOLN
INTRAVENOUS | Status: DC | PRN
  Administered 2016-03-08: 08:00:00 via INTRAVENOUS

## 2016-03-08 MED ORDER — OXYTOCIN 10 UNIT/ML IJ SOLN
INTRAMUSCULAR | Status: AC
  Filled 2016-03-08: qty 4

## 2016-03-08 MED ORDER — ONDANSETRON HCL 4 MG/2ML IJ SOLN: 4.0000 mg | Freq: Four times a day (QID) | INTRAMUSCULAR | Status: DC | PRN

## 2016-03-08 MED ORDER — SCOPOLAMINE 1 MG/3DAYS TD PT72
1.0000 | MEDICATED_PATCH | Freq: Once | TRANSDERMAL | Status: DC
  Administered 2016-03-08: 1.5 mg via TRANSDERMAL

## 2016-03-08 MED ORDER — KETOROLAC TROMETHAMINE 30 MG/ML IJ SOLN: 30.0000 mg | Freq: Once | INTRAMUSCULAR | Status: DC

## 2016-03-08 MED ORDER — DEXTROSE-NACL 5-0.45 % IV SOLN
INTRAVENOUS | Status: DC
Start: 2016-03-08 — End: 2016-03-11
  Administered 2016-03-08 (×2): via INTRAVENOUS

## 2016-03-08 MED ORDER — MORPHINE SULFATE (PF) 0.5 MG/ML IJ SOLN
INTRAMUSCULAR | Status: DC | PRN
  Administered 2016-03-08: .2 mg via INTRATHECAL

## 2016-03-08 MED ORDER — ONDANSETRON HCL 4 MG/2ML IJ SOLN
INTRAMUSCULAR | Status: AC
  Filled 2016-03-08: qty 2

## 2016-03-08 MED ORDER — SODIUM CHLORIDE 0.9% FLUSH: 3.0000 mL | INTRAVENOUS | Status: DC | PRN

## 2016-03-08 MED ORDER — ONDANSETRON HCL 4 MG PO TABS: 4.0000 mg | ORAL_TABLET | Freq: Four times a day (QID) | ORAL | Status: DC | PRN

## 2016-03-08 MED ORDER — FENTANYL CITRATE (PF) 100 MCG/2ML IJ SOLN
INTRAMUSCULAR | Status: DC | PRN
  Administered 2016-03-08: 20 ug via INTRATHECAL

## 2016-03-08 MED ORDER — KETOROLAC TROMETHAMINE 30 MG/ML IJ SOLN: 30.0000 mg | Freq: Four times a day (QID) | INTRAMUSCULAR | Status: DC | PRN

## 2016-03-08 MED ORDER — BUPIVACAINE IN DEXTROSE 0.75-8.25 % IT SOLN
INTRATHECAL | Status: AC
  Filled 2016-03-08: qty 2

## 2016-03-08 MED ORDER — NALOXONE HCL 0.4 MG/ML IJ SOLN: 0.4000 mg | INTRAMUSCULAR | Status: DC | PRN

## 2016-03-08 MED ORDER — KETOROLAC TROMETHAMINE 30 MG/ML IJ SOLN
INTRAMUSCULAR | Status: AC
  Filled 2016-03-08: qty 1

## 2016-03-08 MED ORDER — PHENYLEPHRINE 8 MG IN D5W 100 ML (0.08MG/ML) PREMIX OPTIME
INJECTION | INTRAVENOUS | Status: DC | PRN
  Administered 2016-03-08: 30 ug/min via INTRAVENOUS

## 2016-03-08 MED ORDER — METFORMIN HCL 500 MG PO TABS
500.0000 mg | ORAL_TABLET | Freq: Two times a day (BID) | ORAL | Status: DC
  Administered 2016-03-08 – 2016-03-11 (×6): 500 mg via ORAL
  Filled 2016-03-08 (×8): qty 1

## 2016-03-08 MED ORDER — LACTATED RINGERS IV SOLN
Freq: Once | INTRAVENOUS | Status: AC
  Administered 2016-03-08: 07:00:00 via INTRAVENOUS

## 2016-03-08 MED ORDER — BUPIVACAINE IN DEXTROSE 0.75-8.25 % IT SOLN
INTRATHECAL | Status: DC | PRN
  Administered 2016-03-08: 1.2 mL via INTRATHECAL

## 2016-03-08 MED ORDER — PROMETHAZINE HCL 25 MG/ML IJ SOLN: 6.2500 mg | INTRAMUSCULAR | Status: DC | PRN

## 2016-03-08 MED ORDER — PHENYLEPHRINE 8 MG IN D5W 100 ML (0.08MG/ML) PREMIX OPTIME
INJECTION | INTRAVENOUS | Status: AC
  Filled 2016-03-08: qty 100

## 2016-03-08 MED ORDER — KETOROLAC TROMETHAMINE 30 MG/ML IJ SOLN: 30.0000 mg | Freq: Four times a day (QID) | INTRAMUSCULAR | Status: DC

## 2016-03-08 MED ORDER — KETOROLAC TROMETHAMINE 30 MG/ML IJ SOLN
30.0000 mg | Freq: Four times a day (QID) | INTRAMUSCULAR | Status: DC
  Administered 2016-03-08 – 2016-03-09 (×3): 30 mg via INTRAVENOUS
  Filled 2016-03-08 (×3): qty 1

## 2016-03-08 MED ORDER — IBUPROFEN 600 MG PO TABS
600.0000 mg | ORAL_TABLET | Freq: Four times a day (QID) | ORAL | Status: DC | PRN
  Administered 2016-03-09 – 2016-03-11 (×8): 600 mg via ORAL
  Filled 2016-03-08 (×9): qty 1

## 2016-03-08 MED ORDER — DIPHENHYDRAMINE HCL 50 MG/ML IJ SOLN: 12.5000 mg | INTRAMUSCULAR | Status: DC | PRN

## 2016-03-08 MED ORDER — SCOPOLAMINE 1 MG/3DAYS TD PT72
MEDICATED_PATCH | TRANSDERMAL | Status: AC
  Administered 2016-03-08: 1.5 mg via TRANSDERMAL
  Filled 2016-03-08: qty 1

## 2016-03-08 MED ORDER — NIFEDIPINE ER OSMOTIC RELEASE 30 MG PO TB24
30.0000 mg | ORAL_TABLET | Freq: Every day | ORAL | Status: DC
  Administered 2016-03-09 – 2016-03-11 (×3): 30 mg via ORAL
  Filled 2016-03-08 (×3): qty 1

## 2016-03-08 MED ORDER — MENTHOL 3 MG MT LOZG: 1.0000 | LOZENGE | OROMUCOSAL | Status: DC | PRN

## 2016-03-08 MED ORDER — NALOXONE HCL 2 MG/2ML IJ SOSY
1.0000 ug/kg/h | PREFILLED_SYRINGE | INTRAVENOUS | Status: DC | PRN
  Filled 2016-03-08: qty 2

## 2016-03-08 MED ORDER — OXYTOCIN 10 UNIT/ML IJ SOLN
INTRAVENOUS | Status: DC | PRN
  Administered 2016-03-08: 40 [IU] via INTRAVENOUS

## 2016-03-08 MED ORDER — FENTANYL CITRATE (PF) 100 MCG/2ML IJ SOLN
INTRAMUSCULAR | Status: AC
  Filled 2016-03-08: qty 2

## 2016-03-08 MED ORDER — IBUPROFEN 600 MG PO TABS: 600.0000 mg | ORAL_TABLET | Freq: Four times a day (QID) | ORAL | Status: DC | PRN

## 2016-03-08 MED ORDER — ERYTHROMYCIN 5 MG/GM OP OINT
TOPICAL_OINTMENT | OPHTHALMIC | Status: AC
  Filled 2016-03-08: qty 1

## 2016-03-08 MED ORDER — LACTATED RINGERS IV SOLN
INTRAVENOUS | Status: DC | PRN
  Administered 2016-03-08 (×2): via INTRAVENOUS

## 2016-03-08 MED ORDER — ONDANSETRON HCL 4 MG/2ML IJ SOLN: 4.0000 mg | Freq: Three times a day (TID) | INTRAMUSCULAR | Status: DC | PRN

## 2016-03-08 MED ORDER — SIMETHICONE 80 MG PO CHEW
80.0000 mg | CHEWABLE_TABLET | Freq: Four times a day (QID) | ORAL | Status: DC | PRN
  Administered 2016-03-09: 80 mg via ORAL
  Filled 2016-03-08 (×2): qty 1

## 2016-03-08 MED ORDER — LIDOCAINE-EPINEPHRINE 2 %-1:100000 IJ SOLN
INTRAMUSCULAR | Status: DC | PRN
  Administered 2016-03-08: 2 mL via INTRADERMAL

## 2016-03-08 MED ORDER — DIPHENHYDRAMINE HCL 25 MG PO CAPS
25.0000 mg | ORAL_CAPSULE | ORAL | Status: DC | PRN
  Filled 2016-03-08: qty 1

## 2016-03-08 SURGICAL SUPPLY — 38 items
APL SKNCLS STERI-STRIP NONHPOA (GAUZE/BANDAGES/DRESSINGS) ×1
BENZOIN TINCTURE PRP APPL 2/3 (GAUZE/BANDAGES/DRESSINGS) ×1 IMPLANT
CHLORAPREP W/TINT 26ML (MISCELLANEOUS) ×2 IMPLANT
CLAMP CORD UMBIL (MISCELLANEOUS) IMPLANT
CLOTH BEACON ORANGE TIMEOUT ST (SAFETY) ×2 IMPLANT
CONTAINER PREFILL 10% NBF 15ML (MISCELLANEOUS) IMPLANT
DRSG OPSITE POSTOP 4X10 (GAUZE/BANDAGES/DRESSINGS) ×2 IMPLANT
DRSG OPSITE POSTOP 4X14 (GAUZE/BANDAGES/DRESSINGS) ×1 IMPLANT
ELECT REM PT RETURN 9FT ADLT (ELECTROSURGICAL) ×2
ELECTRODE REM PT RTRN 9FT ADLT (ELECTROSURGICAL) ×1 IMPLANT
EXTRACTOR VACUUM KIWI (MISCELLANEOUS) ×1 IMPLANT
EXTRACTOR VACUUM M CUP 4 TUBE (SUCTIONS) IMPLANT
GLOVE BIOGEL PI IND STRL 7.0 (GLOVE) ×1 IMPLANT
GLOVE BIOGEL PI INDICATOR 7.0 (GLOVE) ×1
GLOVE ORTHO TXT STRL SZ7.5 (GLOVE) ×2 IMPLANT
GOWN STRL REUS W/TWL LRG LVL3 (GOWN DISPOSABLE) ×4 IMPLANT
KIT ABG SYR 3ML LUER SLIP (SYRINGE) IMPLANT
NDL HYPO 25X5/8 SAFETYGLIDE (NEEDLE) ×1 IMPLANT
NEEDLE HYPO 25X5/8 SAFETYGLIDE (NEEDLE) ×2 IMPLANT
NS IRRIG 1000ML POUR BTL (IV SOLUTION) ×2 IMPLANT
PACK C SECTION WH (CUSTOM PROCEDURE TRAY) ×2 IMPLANT
PAD OB MATERNITY 4.3X12.25 (PERSONAL CARE ITEMS) ×2 IMPLANT
PENCIL SMOKE EVAC W/HOLSTER (ELECTROSURGICAL) ×2 IMPLANT
RTRCTR C-SECT PINK 25CM LRG (MISCELLANEOUS) ×2 IMPLANT
STRIP CLOSURE SKIN 1/2X4 (GAUZE/BANDAGES/DRESSINGS) ×1 IMPLANT
SUT CHROMIC 1 CTX 36 (SUTURE) ×4 IMPLANT
SUT PLAIN 0 NONE (SUTURE) IMPLANT
SUT PLAIN 2 0 XLH (SUTURE) ×1 IMPLANT
SUT PROLENE 4 0 KS NDL (SUTURE) IMPLANT
SUT PROLENE 4 0 KS NEEDLE (SUTURE) ×2 IMPLANT
SUT VIC AB 0 CT1 27 (SUTURE) ×4
SUT VIC AB 0 CT1 27XBRD ANBCTR (SUTURE) ×2 IMPLANT
SUT VIC AB 2-0 CT1 (SUTURE) ×2 IMPLANT
SUT VIC AB 2-0 CT1 27 (SUTURE) ×2
SUT VIC AB 2-0 CT1 TAPERPNT 27 (SUTURE) ×1 IMPLANT
SUT VIC AB 4-0 KS 27 (SUTURE) ×1 IMPLANT
TOWEL OR 17X24 6PK STRL BLUE (TOWEL DISPOSABLE) ×2 IMPLANT
TRAY FOLEY CATH SILVER 14FR (SET/KITS/TRAYS/PACK) ×2 IMPLANT

## 2016-03-08 NOTE — Transfer of Care (Signed)
Immediate Anesthesia Transfer of Care Note  Patient: Victoria [name]  Procedure(s) Performed: Procedure(s): CESAREAN SECTION (N/A)  Patient Location: PACU  Anesthesia Type:Spinal  Level of Consciousness: awake, alert  and oriented  Airway & Oxygen Therapy: Patient Spontanous Breathing  Post-op Assessment: Report given to RN  Post vital signs: Reviewed and stable  Last Vitals:  Vitals:   03/08/16 0605  BP: (!) 144/95  Pulse: 92  Resp: 18  Temp: 37 C    Last Pain:  Vitals:   03/08/16 0605  TempSrc: Oral      Patients Stated Pain Goal: 3 ([id number])  Complications: No apparent anesthesia complications

## 2016-03-08 NOTE — Addendum Note (Signed)
Addendum  created 03/08/16 1527 by Hewitt Blade, CRNA   Sign clinical note

## 2016-03-08 NOTE — Anesthesia Postprocedure Evaluation (Signed)
Anesthesia Post Note  Patient: Victoria [name]  Procedure(s) Performed: Procedure(s) (LRB): CESAREAN SECTION (N/A)  Patient location during evaluation: PACU Anesthesia Type: Combined Spinal/Epidural Level of consciousness: awake Pain management: pain level controlled Vital Signs Assessment: post-procedure vital signs reviewed and stable Respiratory status: spontaneous breathing Cardiovascular status: stable Postop Assessment: no headache, no backache, spinal receding, patient able to bend at knees and no signs of nausea or vomiting Anesthetic complications: no     Last Vitals:  Vitals:   03/08/16 0905 03/08/16 0910  BP: (!) 187/130 (!) 130/94  Pulse: 80 73  Resp: 15 12  Temp:      Last Pain:  Vitals:   03/08/16 0910  TempSrc:   PainSc: 0-No pain   Pain Goal: Patients Stated Pain Goal: 3 (03/08/16 0605)               [name],[name]

## 2016-03-08 NOTE — Anesthesia Procedure Notes (Signed)
Spinal  Patient location during procedure: OR Start time: 03/08/2016 7:33 AM End time: 03/08/2016 7:37 AM Reason for block: procedure for pain Staffing Anesthesiologist: Lyn Hollingshead Performed: anesthesiologist  Preanesthetic Checklist Completed: patient identified, surgical consent, pre-op evaluation, timeout performed, IV checked, risks and benefits discussed and monitors and equipment checked Spinal Block Patient position: sitting Prep: site prepped and draped and DuraPrep Patient monitoring: cardiac monitor, continuous pulse ox, blood pressure and heart rate Approach: midline Location: L3-4 Injection technique: catheter Needle Needle type: Tuohy and Sprotte  Needle gauge: 24 G Needle length: 12.7 cm Needle insertion depth: 9 cm Catheter type: closed end flexible Catheter size: 19 g Catheter at skin depth: 15 cm Assessment Sensory level: T6

## 2016-03-08 NOTE — Lactation Note (Signed)
This note was copied from a baby's chart. Lactation Consultation Note   With this mom and term baby, now 59 hours old. I assisted mom with latching baby, in football hold. I t took several tries to get him to latch, but once latched he suckled ell for about 10 minutes. I was able to express a glistening of colostrum, although mom said her breasts were leaking in the past couple of months. Breastfeeding teaching done from the baby and me book, and lactation services also reviewed. Mom is a [employer] employee,and will get a DEP this visit. Mom knows to call for questions/cocnerns.   Patient Name: Victoria [name] [id number] Date: 03/08/2016     Maternal Data Formula Feeding for Exclusion: No Has patient been taught Hand Expression?: Yes Does the patient have breastfeeding experience prior to this delivery?:  (mom breast fed her first 2 children)  Feeding Feeding Type: Breast Fed Length of feed: 10 min  LATCH Score/Interventions Latch: Repeated attempts needed to sustain latch, nipple held in mouth throughout feeding, stimulation needed to elicit sucking reflex. Intervention(s): Skin to skin;Teach feeding cues;Waking techniques Intervention(s): Adjust position;Assist with latch;Breast compression  Audible Swallowing: None Intervention(s): Skin to skin;Hand expression  Type of Nipple: Inverted (left nipple partially everted with partial invert, and flatens with compression, may need 24 nipple shiled)  Comfort (Breast/Nipple): Soft / non-tender     Hold (Positioning): Assistance needed to correctly position infant at breast and maintain latch. Intervention(s): Breastfeeding basics reviewed;Support Pillows;Position options;Skin to skin  LATCH Score: 4  Lactation Tools Discussed/Used     Consult Status Consult Status: Follow-up Date: 03/09/16 Follow-up type: In-patient    Tonna Corner 03/08/2016, 4:43 PM

## 2016-03-08 NOTE — Anesthesia Preprocedure Evaluation (Signed)
Anesthesia Evaluation  Patient identified by MRN, date of birth, ID band Patient awake    Reviewed: Allergy & Precautions, H&P , NPO status , Patient's Chart, lab work & pertinent test results  History of Anesthesia Complications (+) DIFFICULT AIRWAY and history of anesthetic complications  Airway Mallampati: IV  TM Distance: >3 FB Neck ROM: full    Dental no notable dental hx.    Pulmonary asthma , sleep apnea ,    Pulmonary exam normal        Cardiovascular Exercise Tolerance: Good hypertension, Normal cardiovascular exam     Neuro/Psych    GI/Hepatic negative GI ROS, Neg liver ROS,   Endo/Other  diabetes, GestationalMorbid obesity  Renal/GU negative Renal ROS     Musculoskeletal negative musculoskeletal ROS (+)   Abdominal (+) + obese,   Peds  Hematology negative hematology ROS (+)   Anesthesia Other Findings Difficult airway by exam/ not Hx Diabetes mellitus  type 2 Asthma        Obesity         Hypertension    Reproductive/Obstetrics (+) Pregnancy                             Anesthesia Physical  Anesthesia Plan  ASA: III and emergent  Anesthesia Plan: Combined Spinal and Epidural   Post-op Pain Management:    Induction:   Airway Management Planned:   Additional Equipment:   Intra-op Plan:   Post-operative Plan:   Informed Consent: I have reviewed the patients History and Physical, chart, labs and discussed the procedure including the risks, benefits and alternatives for the proposed anesthesia with the patient or authorized representative who has indicated his/her understanding and acceptance.   Dental Advisory Given  Plan Discussed with: CRNA and Surgeon  Anesthesia Plan Comments: (Lab work confirmed with CRNA in room. Platelets okay. Discussed spinal anesthetic, and patient consents to the procedure:  included risk of possible headache,backache, failed  block, allergic reaction, and nerve injury. This patient was asked if she had any questions or concerns before the procedure started. )        Anesthesia Quick Evaluation

## 2016-03-08 NOTE — Anesthesia Postprocedure Evaluation (Signed)
Anesthesia Post Note  Patient: Victoria [name]  Procedure(s) Performed: Procedure(s) (LRB): CESAREAN SECTION (N/A)  Patient location during evaluation: [facility] Anesthesia Type: Combined Spinal/Epidural Level of consciousness: awake and alert Pain management: pain level controlled Vital Signs Assessment: post-procedure vital signs reviewed and stable Respiratory status: spontaneous breathing and nonlabored ventilation Cardiovascular status: stable Postop Assessment: no headache, no backache, no signs of nausea or vomiting, epidural receding, adequate PO intake, spinal receding and patient able to bend at knees Anesthetic complications: no     Last Vitals:  Vitals:   03/08/16 1400 03/08/16 1500  BP: 122/71 119/82  Pulse: 72 72  Resp: 20 20  Temp: 36.8 C 36.6 C    Last Pain:  Vitals:   03/08/16 1510  TempSrc:   PainSc: 2    Pain Goal: Patients Stated Pain Goal: 3 (03/08/16 1510)               France Ravens Hristova

## 2016-03-08 NOTE — Interval H&P Note (Signed)
History and Physical Interval Note:  03/08/2016 7:05 AM  Victoria [name]  has presented today for surgery, with the diagnosis of CHTN, Type II DM, Repeat C-Section  The various methods of treatment have been discussed with the patient and family. After consideration of risks, benefits and other options for treatment, the patient has consented to  Procedure(s): CESAREAN SECTION (N/A) as a surgical intervention .  The patient's history has been reviewed, patient examined, no change in status, stable for surgery.  I have reviewed the patient's chart and labs.  Questions were answered to the patient's satisfaction.     Joretta Eads D

## 2016-03-08 NOTE — Op Note (Addendum)
Preoperative diagnosis: Intrauterine pregnancy at 60 weeks, CHTN, Type II DM, previous c-section x 2 Postoperative diagnosis: Same Procedure: Repeat low transverse cesarean section without extensions Surgeon: Cheri Fowler M.D. Assistants:  Janyth Contes, MD, Adair Patter, RNFA Anesthesia: CSE Findings: Patient had normal gravid anatomy and delivered a viable female infant with Apgars of 10 and 10 weight pending Estimated blood loss: 800 cc Specimens: Placenta for pathology Complications: None  Procedure in detail: The patient was taken to the operating room and placed in the sitting position.  Dr. Jillyn Hidden instilled combined spinal-epidural anesthesia.  She was then placed in the dorsosupine position with left tilt. Abdomen was then prepped and draped in the usual sterile fashion, panniculus taped up with a Traxi device, and a foley catheter was inserted. The level of her anesthesia was found to be adequate. Abdomen was entered via a standard Pfannenstiel incision through her previous scar. Once the peritoneal cavity was entered and omental adhesions were freed up, the Alexis disposable self-retaining retractor was placed and good visualization was achieved. A 4 cm transverse incision was then made in the lower uterine segment pushing the bladder inferior. Once the uterine cavity was entered the incision was extended digitally. The fetal vertex was delivered with vacuum assistance. Mouth and nares were suctioned. The remainder of the infant then delivered atraumatically. Cord was doubly clamped and cut and the infant handed to the awaiting pediatric team. The placenta delivered spontaneously. Uterus was wiped dry with clean lap pad and all clots and debris were removed. Uterine incision was inspected and found to be free of extensions. Uterine incision was closed in 1 layer with running locking #1 Chromic. Tubes and ovaries were inspected and found to be normal. Uterine incision was  inspected and found to be hemostatic. Bleeding from serosal edges was controlled with electrocautery. The Alexis retractor was removed. Subfascial space was irrigated and made hemostatic with electrocautery. Peritoneum was closed with 2-0 Vicryl.  Fascia was closed in running fashion starting at both ends and meeting in the middle with 0 Vicryl. Subcutaneous tissue was then irrigated and made hemostatic with electrocautery, then closed with running 2-0 plain gut. Skin was closed with running 4-0 Vicryl subcuticular suture followed by steri-strips and a sterile dressing. Patient tolerated the procedure well and was taken to the recovery in stable condition. Counts were correct x2, she received Ancef 3 g IV at the beginning of the procedure and she had PAS hose on throughout the procedure.

## 2016-03-09 DIAGNOSIS — O99214 Obesity complicating childbirth: Secondary | ICD-10-CM | POA: Diagnosis not present

## 2016-03-09 DIAGNOSIS — O2412 Pre-existing diabetes mellitus, type 2, in childbirth: Secondary | ICD-10-CM | POA: Diagnosis not present

## 2016-03-09 DIAGNOSIS — R21 Rash and other nonspecific skin eruption: Secondary | ICD-10-CM | POA: Diagnosis not present

## 2016-03-09 DIAGNOSIS — O34211 Maternal care for low transverse scar from previous cesarean delivery: Secondary | ICD-10-CM | POA: Diagnosis not present

## 2016-03-09 DIAGNOSIS — O1002 Pre-existing essential hypertension complicating childbirth: Secondary | ICD-10-CM | POA: Diagnosis not present

## 2016-03-09 DIAGNOSIS — Z7984 Long term (current) use of oral hypoglycemic drugs: Secondary | ICD-10-CM | POA: Diagnosis not present

## 2016-03-09 DIAGNOSIS — E119 Type 2 diabetes mellitus without complications: Secondary | ICD-10-CM | POA: Diagnosis not present

## 2016-03-09 DIAGNOSIS — O99824 Streptococcus B carrier state complicating childbirth: Secondary | ICD-10-CM | POA: Diagnosis not present

## 2016-03-09 LAB — CBC
HEMATOCRIT: 29.2 % — AB (ref 36.0–46.0)
HEMOGLOBIN: 10.1 g/dL — AB (ref 12.0–15.0)
MCH: 26.6 pg (ref 26.0–34.0)
MCHC: 34.6 g/dL (ref 30.0–36.0)
MCV: 77 fL — AB (ref 78.0–100.0)
Platelets: 232 10*3/uL (ref 150–400)
RBC: 3.79 MIL/uL — AB (ref 3.87–5.11)
RDW: 15.2 % (ref 11.5–15.5)
WBC: 7.1 10*3/uL (ref 4.0–10.5)

## 2016-03-09 LAB — CCBB MATERNAL DONOR DRAW

## 2016-03-09 LAB — GLUCOSE, CAPILLARY
GLUCOSE-CAPILLARY: 103 mg/dL — AB (ref 65–99)
GLUCOSE-CAPILLARY: 85 mg/dL (ref 65–99)
Glucose-Capillary: 94 mg/dL (ref 65–99)

## 2016-03-09 NOTE — Progress Notes (Signed)
Subjective: Postpartum Day #1: Cesarean Delivery Patient reports incisional pain, tolerating PO and no problems voiding.    Objective: Vital signs in last 24 hours: Temp:  [97.7 F (36.5 C)-99.1 F (37.3 C)] 98.7 F (37.1 C) (10/14 0517) Pulse Rate:  [66-87] 85 (10/14 0517) Resp:  [12-29] 16 (10/14 0517) BP: (117-156)/(66-111) 119/67 (10/14 0517) SpO2:  [95 %-100 %] 100 % (10/14 0517) Weight:  [144.2 kg (318 lb)] 144.2 kg (318 lb) (10/14 0800)  Physical Exam:  General: alert Lochia: appropriate Uterine Fundus: firm Incision: dressing with some bloody discharge    Recent Labs  03/07/16 1005 03/09/16 0546  HGB 11.0* 10.1*  HCT 31.6* 29.2*  all sugars normal  Assessment/Plan: Status post Cesarean section. Doing well postoperatively.  Continue current care, ambulate, continue Procardia for BP and has restarted metformin.  Cozette Braggs D 03/09/2016, 9:19 AM

## 2016-03-09 NOTE — Plan of Care (Signed)
Problem: Physical Regulation: Goal: Ability to maintain clinical measurements within normal limits will improve Outcome: Completed/Met Date Met: 03/09/16 CBG checks within normal, Vital signs within normal  Problem: Activity: Goal: Risk for activity intolerance will decrease Outcome: Completed/Met Date Met: 03/09/16 Up ad lib.  Showered without assistance.

## 2016-03-09 NOTE — Lactation Note (Signed)
This note was copied from a baby's chart. Lactation Consultation Note  Patient Name: Victoria [name] [id number] Date: 03/09/2016 Reason for consult: Follow-up assessment Infant is 27 hours & seen by [facility] for follow-up assessment. Baby was with visitor when [facility] entered but mom stated that he was showing some feeding cues so wanted to BF with LC in room. Mom stated she has supplemented a few times because she was worried he wasn't getting much breastmilk & reports that she has struggled with BF through the night. Mom has h/o GDM during this pregnancy but baby's blood sugars have been wnl:  41 mg/dL @ 0952 03/08/16 & 58 mg/dL @ 1149 03/08/16. Mom stated she has pumped but didn't get much milk but was able to get drops when she hand expressed. Mom did a little hand expression before latching baby & drops were noted. Baby latched right away in football hold on mom's right breast; no help was needed from [facility]. Encouraged mom to flip out baby's top lip but bottom lip looked good & mom reported no pain, just a strong tug. Swallows were noted. Encouraged mom to stimulate baby if his sucking stops. Baby continued to BF for ~20 mins and then mom took baby off. Mom's nipples before BF were short shafted & appeared dimpled in the center but baby was able to latch & suckle and after BF mom's nipple was round & protruding. Mom also stated that after pumping her nipples came out & stayed erected. Mom then latched baby on left breast in football hold & again, baby latched right away & suckled for another ~5 mins. Mom encouraged to feed baby 8-12 times/24 hours and with feeding cues. Reviewed breastfeeding basics, engorgement care, proper latch, position options including laid back BF. Mom reports no further questions at this time but knows to ask her nurse or [facility] if she has any later.  Dad came with LC to get UMR Medela DEBP- backpack style.   Maternal Data    Feeding Feeding Type: Breast Fed Length of feed: 25 min  LATCH  Score/Interventions Latch: Grasps breast easily, tongue down, lips flanged, rhythmical sucking. Intervention(s): Breast compression  Audible Swallowing: A few with stimulation Intervention(s): Skin to skin Intervention(s): Alternate breast massage  Type of Nipple: Flat (baby able to pull nipple out; short shaft) Intervention(s): No intervention needed  Comfort (Breast/Nipple): Soft / non-tender     Hold (Positioning): No assistance needed to correctly position infant at breast. Intervention(s): Breastfeeding basics reviewed  LATCH Score: 8  Lactation Tools Discussed/Used     Consult Status Consult Status: Follow-up Date: 03/10/16 Follow-up type: In-patient    [name] 03/09/2016, 12:41 PM

## 2016-03-10 DIAGNOSIS — Z7984 Long term (current) use of oral hypoglycemic drugs: Secondary | ICD-10-CM | POA: Diagnosis not present

## 2016-03-10 DIAGNOSIS — E119 Type 2 diabetes mellitus without complications: Secondary | ICD-10-CM | POA: Diagnosis not present

## 2016-03-10 DIAGNOSIS — O34211 Maternal care for low transverse scar from previous cesarean delivery: Secondary | ICD-10-CM | POA: Diagnosis not present

## 2016-03-10 DIAGNOSIS — O1002 Pre-existing essential hypertension complicating childbirth: Secondary | ICD-10-CM | POA: Diagnosis not present

## 2016-03-10 DIAGNOSIS — O2412 Pre-existing diabetes mellitus, type 2, in childbirth: Secondary | ICD-10-CM | POA: Diagnosis not present

## 2016-03-10 DIAGNOSIS — O99214 Obesity complicating childbirth: Secondary | ICD-10-CM | POA: Diagnosis not present

## 2016-03-10 DIAGNOSIS — R21 Rash and other nonspecific skin eruption: Secondary | ICD-10-CM | POA: Diagnosis not present

## 2016-03-10 DIAGNOSIS — O99824 Streptococcus B carrier state complicating childbirth: Secondary | ICD-10-CM | POA: Diagnosis not present

## 2016-03-10 LAB — GLUCOSE, CAPILLARY: GLUCOSE-CAPILLARY: 87 mg/dL (ref 65–99)

## 2016-03-10 MED ORDER — COMPLETENATE 29-1 MG PO CHEW
1.0000 | CHEWABLE_TABLET | Freq: Every day | ORAL | Status: DC
  Administered 2016-03-10 – 2016-03-11 (×2): 1 via ORAL
  Filled 2016-03-10 (×3): qty 1

## 2016-03-10 MED ORDER — COCONUT OIL OIL
1.0000 "application " | TOPICAL_OIL | Status: DC | PRN
  Administered 2016-03-10: 1 via TOPICAL
  Filled 2016-03-10: qty 120

## 2016-03-10 NOTE — Progress Notes (Signed)
POD #2 Doing well, no problems Afeb, VSS, BP nl All CBGs normal Abd- soft, fundus firm, new dressing intact Continue routine care, continue procardia for BP, continue Metformin but will d/c CBGs

## 2016-03-10 NOTE — Lactation Note (Signed)
This note was copied from a baby's chart. Lactation Consultation Note  Patient Name: Victoria [name] [id number] Date: 03/10/2016 Reason for consult: Follow-up assessment;Infant weight loss   Follow up with mom of 66 hour old infant. Infant with 8 BF for 10-40 minutes, 1 formula feeding of 10cc, 1 bottle EBM of 15 cc, 3 voids and 3 stools in 24 hours preceding this assessment. Infant weight 8 lb 4.1 oz with weight loss of 7%. LATCH Scores 10. Infant asleep in GM arms.    Mom reports her milk has come in and she feels infant is feeding well. She reports some nipple tenderness that has improved with improved latch, she requested coconut oil, Mom's RN to get for mom.   Mom reports she is worried about his weight so she is pumping after BF and supplementing infant with EBM with bottle. Mom declined needing assistance today. Enc mom to call with questions/concerns prn.    Maternal Data    Feeding Feeding Type: Breast Milk Nipple Type: Slow - flow Length of feed: 15 min  LATCH Score/Interventions                      Lactation Tools Discussed/Used WIC Program: No   Consult Status Consult Status: Follow-up Date: 03/11/16 Follow-up type: In-patient    Debby Freiberg Dashonna Chagnon 03/10/2016, 3:24 PM

## 2016-03-11 DIAGNOSIS — E119 Type 2 diabetes mellitus without complications: Secondary | ICD-10-CM | POA: Diagnosis not present

## 2016-03-11 DIAGNOSIS — O99214 Obesity complicating childbirth: Secondary | ICD-10-CM | POA: Diagnosis not present

## 2016-03-11 DIAGNOSIS — O1002 Pre-existing essential hypertension complicating childbirth: Secondary | ICD-10-CM | POA: Diagnosis not present

## 2016-03-11 DIAGNOSIS — O2412 Pre-existing diabetes mellitus, type 2, in childbirth: Secondary | ICD-10-CM | POA: Diagnosis not present

## 2016-03-11 DIAGNOSIS — R21 Rash and other nonspecific skin eruption: Secondary | ICD-10-CM | POA: Diagnosis not present

## 2016-03-11 DIAGNOSIS — O34211 Maternal care for low transverse scar from previous cesarean delivery: Secondary | ICD-10-CM | POA: Diagnosis not present

## 2016-03-11 DIAGNOSIS — Z7984 Long term (current) use of oral hypoglycemic drugs: Secondary | ICD-10-CM | POA: Diagnosis not present

## 2016-03-11 DIAGNOSIS — O99824 Streptococcus B carrier state complicating childbirth: Secondary | ICD-10-CM | POA: Diagnosis not present

## 2016-03-11 LAB — TYPE AND SCREEN
ABO/RH(D): B POS
Antibody Screen: NEGATIVE
UNIT DIVISION: 0
Unit division: 0

## 2016-03-11 MED ORDER — OXYCODONE-ACETAMINOPHEN 5-325 MG PO TABS: 1.0000 | ORAL_TABLET | Freq: Four times a day (QID) | ORAL | 0 refills | Status: DC | PRN

## 2016-03-11 MED ORDER — HYDROCORTISONE 0.5 % EX CREA: 1.0000 "application " | TOPICAL_CREAM | Freq: Two times a day (BID) | CUTANEOUS | 1 refills | Status: DC

## 2016-03-11 MED ORDER — IBUPROFEN 600 MG PO TABS: 600.0000 mg | ORAL_TABLET | Freq: Four times a day (QID) | ORAL | 1 refills | Status: DC | PRN

## 2016-03-11 MED ORDER — METFORMIN HCL 500 MG PO TABS: 500.0000 mg | ORAL_TABLET | Freq: Two times a day (BID) | ORAL | 2 refills | Status: DC

## 2016-03-11 MED FILL — metFORMIN HCL 500 MG TABS: 500 | 30 days supply | Qty: 60 | Fill #0

## 2016-03-11 MED FILL — IBUPROFEN 600 MG TABLET: 600 | 15 days supply | Qty: 60 | Fill #0

## 2016-03-11 MED FILL — OXYCODONE/APAP 5-325: 5-325 | 4 days supply | Qty: 30 | Fill #0

## 2016-03-11 NOTE — Discharge Instructions (Signed)
Nothing in vagina for 6 weeks.  No sex, tampons, and douching.  Other instructions as in [facility] Discharge Booklet. °

## 2016-03-11 NOTE — Discharge Summary (Signed)
OB Discharge Summary     Patient Name: Victoria [name] DOB: [birth year] MRN: [id number]  Date of admission: 03/08/2016 Delivering MD: Cheri Fowler   Date of discharge: 03/11/2016  Admitting diagnosis: CHTN, Type II DM, Repeat C-Section Intrauterine pregnancy: [redacted]w[redacted]d     Secondary diagnosis:  Active Problems:   S/P cesarean section  Additional problems: none     Discharge diagnosis: Term Pregnancy Delivered                                                                                                Post partum procedures:none  Augmentation: none  Complications: None  Hospital course:  Sceduled C/S   35 y.o. yo [id number] at [redacted]w[redacted]d was admitted to the hospital 03/08/2016 for scheduled cesarean section with the following indication:Elective Repeat.  Membrane Rupture Time/Date: 8:06 AM ,03/08/2016   Patient delivered a Viable infant.03/08/2016  Details of operation can be found in separate operative note.  Pateint had an uncomplicated postpartum course.  She is ambulating, tolerating a regular diet, passing flatus, and urinating well. Patient is discharged home in stable condition on  03/11/16          Physical exam Vitals:   03/10/16 1149 03/10/16 1820 03/10/16 2135 03/11/16 0556  BP: (!) 152/95 (!) 142/91 117/73 129/84  Pulse: 85 87 86 90  Resp: 18 18 18 20   Temp:  98.6 F (37 C) 98.6 F (37 C) 98.9 F (37.2 C)  TempSrc:  Oral Oral Oral  SpO2: 100% 99% 99% 97%  Weight:      Height:       General: alert, cooperative and no distress Lochia: appropriate Uterine Fundus: difficult to palpate due to habitus Incision: dressing off; steristrips in place; mild rash DVT Evaluation: No evidence of DVT seen on physical exam. Negative Homan's sign. No significant calf/ankle edema. Labs: Lab Results  Component Value Date   WBC 7.1 03/09/2016   HGB 10.1 (L) 03/09/2016   HCT 29.2 (L) 03/09/2016   MCV 77.0 (L) 03/09/2016   PLT 232 03/09/2016   CMP Latest Ref Rng &  Units 03/07/2016  Glucose 65 - 99 mg/dL 151(H)  BUN 6 - 20 mg/dL 8  Creatinine 0.44 - 1.00 mg/dL 0.53  Sodium 135 - 145 mmol/L 132(L)  Potassium 3.5 - 5.1 mmol/L 3.5  Chloride 101 - 111 mmol/L 104  CO2 22 - 32 mmol/L 21(L)  Calcium 8.9 - 10.3 mg/dL 8.8(L)  Total Protein 6.5 - 8.1 g/dL -  Total Bilirubin 0.3 - 1.2 mg/dL -  Alkaline Phos 38 - 126 U/L -  AST 15 - 41 U/L -  ALT 14 - 54 U/L -    Discharge instruction: per After Visit Summary and "Baby and Me Booklet".  After visit meds:    Medication List    TAKE these medications   albuterol 108 (90 Base) MCG/ACT inhaler Commonly known as:  PROVENTIL HFA;VENTOLIN HFA Inhale into the lungs every 6 (six) hours as needed for wheezing or shortness of breath.   albuterol (2.5 MG/3ML) 0.083% nebulizer solution Commonly known as:  PROVENTIL Take 2.5 mg by  nebulization every 6 (six) hours as needed for wheezing or shortness of breath.   hydrocortisone cream 0.5 % Apply 1 application topically 2 (two) times daily.   ibuprofen 600 MG tablet Commonly known as:  ADVIL,MOTRIN Take 1 tablet (600 mg total) by mouth every 6 (six) hours as needed for headache, mild pain, moderate pain or cramping (mild pain).   metFORMIN 500 MG tablet Commonly known as:  GLUCOPHAGE Take 1 tablet (500 mg total) by mouth 2 (two) times daily with a meal. What changed:  Another medication with the same name was removed. Continue taking this medication, and follow the directions you see here.   NIFEdipine 30 MG 24 hr tablet Commonly known as:  PROCARDIA-XL/ADALAT-CC/NIFEDICAL-XL Take 30 mg by mouth daily.   ondansetron 4 MG tablet Commonly known as:  ZOFRAN Take 1 tablet (4 mg total) by mouth every 6 (six) hours.   oxyCODONE-acetaminophen 5-325 MG tablet Commonly known as:  PERCOCET/ROXICET Take 1-2 tablets by mouth every 6 (six) hours as needed for severe pain (moderate to severe pain (when tolerating fluids)).   prenatal multivitamin Tabs tablet Take  1 tablet by mouth daily at 12 noon.       Diet: low salt diet  Activity: Advance as tolerated. Pelvic rest for 6 weeks.   Outpatient follow up:2 weeks Follow up Appt:No future appointments. Follow up Visit:No Follow-up on file.  Postpartum contraception: IUD Mirena  Newborn Data: Live born female  Birth Weight: 8 lb 14.7 oz (4045 g) APGAR: 10, 10  Baby Feeding: Breast Disposition:home with mother   03/11/2016 Sherlyn Hay, DO

## 2016-03-11 NOTE — Lactation Note (Signed)
This note was copied from a baby's chart. Lactation Consultation Note  Patient Name: Boy Bevelyn Alber [id number] Date: 03/11/2016 Reason for consult: Follow-up assessment;Infant weight loss Observed baby at the breast.  Mom has large nipples and baby noted to have a short posterior frenulum.  Baby is able to extend tongue well but limited mobility elevating tongue.  Baby latches easily and alternates between nutritive and non nutritive.  Mom using waking techniques and breast massage.  C/o uterine cramping during feeding.  Instructed to continue feeding with cues, post pump after feeds and supplement with any breastmilk pumped.  Reviewed outpatient services and support and encouraged to call prn.  Maternal Data    Feeding Feeding Type: Breast Fed Length of feed: 20 min  LATCH Score/Interventions Latch: Grasps breast easily, tongue down, lips flanged, rhythmical sucking. Intervention(s): Waking techniques Intervention(s): Adjust position;Assist with latch;Breast massage;Breast compression  Audible Swallowing: A few with stimulation Intervention(s): Hand expression;Alternate breast massage  Type of Nipple: Everted at rest and after stimulation  Comfort (Breast/Nipple): Soft / non-tender     Hold (Positioning): Assistance needed to correctly position infant at breast and maintain latch. Intervention(s): Breastfeeding basics reviewed;Support Pillows;Position options  LATCH Score: 8  Lactation Tools Discussed/Used     Consult Status      Ave Filter 03/11/2016, 11:37 AM

## 2016-03-11 NOTE — Lactation Note (Signed)
This note was copied from a baby's chart. Lactation Consultation Note  Patient Name: Victoria [name] [id number] Date: 03/11/2016  Mom states baby is latching but at times slips down to nipple.  She is using breast massage and compression to keep baby more effective.  She is post pumping and obtaining 30+ mls from each breast.  Baby is at 9 % weight loss so I recommended offering baby what she obtains pumping after breastfeeding.  Explained there is no reason to limit amount of breast milk given.  I will assess next feeding at breast.  Mom has a Medela breast pump to use after discharge.   Maternal Data    Feeding Feeding Type: Bottle Fed - Breast Milk  LATCH Score/Interventions                      Lactation Tools Discussed/Used     Consult Status      Ave Filter 03/11/2016, 9:41 AM

## 2016-03-11 NOTE — Progress Notes (Signed)
Patient discharged home with husband and infant. Discharge paperwork and packet reviewed. Prescriptions given to patient. No questions at this time.

## 2016-03-11 NOTE — Progress Notes (Signed)
Patient ID: Victoria [name], female   DOB: 28-Apr-1981, 35 y.o.   MRN: [id number] Pt doing well. No fever, chills or CP. Reports a rash on right side of abdomen-itchy. No headaches or blurry vision. Ambulating well .Lochia mild. Ready for discharge to home today VSS- 129/84 ABD- mild urticarial rash on right side of abdomen; no drainage or bleeding from incision; steristrips in place EXT- no Homans; no edema  A/P: POD#3 s/p repeat c/s - stable         Bonding well with baby - breastfeeding/pumping         Desires mirena for bc         Discharge instructions reviewed - steroid cream for rash

## 2016-04-15 ENCOUNTER — Telehealth (HOSPITAL_COMMUNITY): Payer: Self-pay | Admitting: Lactation Services

## 2016-04-15 DIAGNOSIS — Z30430 Encounter for insertion of intrauterine contraceptive device: Secondary | ICD-10-CM | POA: Diagnosis not present

## 2016-04-15 MED FILL — metFORMIN HCL 500 MG TABS: 500 | 30 days supply | Qty: 60 | Fill #1

## 2016-04-15 MED FILL — NIFEDIPINE ER 30 MG TABLET: 30 | 30 days supply | Qty: 30 | Fill #5

## 2016-04-15 NOTE — Telephone Encounter (Signed)
Mom wanting OP appointment for help with latch. Baby is difficult to latch and shallow when on the breast per Mom's report. Mom is pumping every 2-3 hours and receiving on average 8-10 oz each pumping. This am she reports receiving 20 oz with pumping. Baby now 32 month old. LC advised Mom she is probably overproducing milk. Mom reports breast are soft between pumping or BF. Advised Mom to pre-pump thru 1st milk ejection to be sure nipple/aerola are soft and compressible to see if this helps with latch. Post post as needed to comfort. If baby not latching then pump every 3 hours but try not to pump more frequent than this unless breast becoming very hard. Pump to soften breasts but do not pump to completely empty breast. Hopefully this will help Mom's body regulate milk flow. OP appointment scheduled for Wednesday, 04/17/16.

## 2016-04-17 ENCOUNTER — Ambulatory Visit (HOSPITAL_COMMUNITY)
Admission: RE | Admit: 2016-04-17 | Discharge: 2016-04-17 | Disposition: A | Discharge status: Home / Self Care | Payer: 59 | Source: Ambulatory Visit | Attending: Obstetrics & Gynecology | Admitting: Obstetrics & Gynecology

## 2016-04-17 ENCOUNTER — Other Ambulatory Visit (HOSPITAL_COMMUNITY): Payer: Self-pay

## 2016-04-17 NOTE — Lactation Note (Signed)
Lactation Consult  Mother's reason for visit: Infant is 59 weeks old. Mother states that Peds referred her to see a LC to assist with latching. Mother states that she breastfeeds infant and then supplements infant 3 ounces. Infant lost weight at birth and supplementing was started. Mother is now offering one breast and then she supplements infant with 2-3 ounces.  Visit Type:feeding assessment Appointment Notes:   Consult:  Initial Lactation Consultant:  Darla Lesches  ________________________________________________________________________    ________________________________________________________________________  Mother's Name: Victoria [name] Type of delivery:   Breastfeeding Experience:  3-10 months with 2 other children Maternal Medical Conditions:  Diabetes, Pregnancy induced hypertension and HTN Maternal Medications:  Metformin, procardia, prenatal vit  ________________________________________________________________________  Breastfeeding History (Post Discharge)  Frequency of breastfeeding: every 2-3 hours  Duration of feeding: 30 mins    Pumping  Type of pump:  Medela pump in style Frequency:  Every 2-3 hours Volume: 10 ounces   Infant Intake and Output Assessment  Voids: 10-12 in 24 hrs.  Color:  Clear yellow Stools:  3-4 in 24 hrs.  Color:  Yellow  ________________________________________________________________________  Maternal Breast Assessment  Breast:  Full Nipple:  Erect Pain level:  0 Pain interventions:  Bra  _______________________________________________________________________ Feeding Assessment/Evaluation     Mother latched infant on the left breast for 15-20 mins. Infant was not very aggressive , lots of stimulation while on the breast to get infant to suckle and swallow.  Infant offered the alternate breast and was most aggressive and awake for feeding.   Infant's oral assessment:  Variance posterior tongue tie and upper lip  tie  Positioning:  Cross cradle Left breast  LATCH documentation:  Latch:  2 = Grasps breast easily, tongue down, lips flanged, rhythmical sucking.  Audible swallowing:  2 = Spontaneous and intermittent  Type of nipple:  2 = Everted at rest and after stimulation  Comfort (Breast/Nipple):  1 = Filling, red/small blisters or bruises, mild/mod discomfort  Hold (Positioning):  1 = Assistance needed to correctly position infant at breast and maintain latch  LATCH score: 8  Attached assessment:  Deep  Lips flanged:  No.  Lips untucked:  No.  Suck assessment:  Displays both    Pre-feed weight: 11-15.2 ,5420 g   Post feed weight: 12-0.2, 5450 g Amount transferred:  30 ml  Infant's oral assessment:  Variance  Positioning:  Cross cradle Left breast  LATCH documentation:  Latch:  2 = Grasps breast easily, tongue down, lips flanged, rhythmical sucking.  Audible swallowing:  2 = Spontaneous and intermittent  Type of nipple:  2 = Everted at rest and after stimulation  Comfort (Breast/Nipple):  1 = Filling, red/small blisters or bruises, mild/mod discomfort  Hold (Positioning):  2 = No assistance needed to correctly position infant at breast  LATCH score:  9  Attached assessment:  Deep  Lips flanged:  Yes.    Lips untucked:  Yes.    Suck assessment:  Displays both  Pre-feed weight:  5450  Post-feed weight:5488 Amount transferred:  38 ml  Total amount transferred: 68 ml Advised mother to begin to  offer both breast when breast feeding. Suggested that she begin to cue base feed infant. And supplement only when she thinks he didn't get enough from her.  Advised mother to continue to offer supplement as infant needs. Give 2-3 ounces of ebm Mother has large volume of milk , infant has a  good latch with good depth.  Mother to follow up with  LC or [facility] for weight checks weekly.

## 2016-04-22 ENCOUNTER — Ambulatory Visit: Payer: Self-pay

## 2016-05-12 NOTE — Progress Notes (Signed)
Chief Complaint  Patient presents with  . Diabetes    fasting med check.    Noticed some bumps at the right anterior lower shin. First noticed when she got pregnant, a little larger since delivering the baby, but no further increase in size.  No h/o known trauma or injury preceding this.  Has never been tender.  She had repeat C-section in October, having a healthy baby boy.  She had an uncomplicated pregnancy. She is currently breastfeeding. Her metformin dose was increased to 856m BID during her pregnancy, and she was discharged on metformin 502mBID (same dose as prior to pregnancy) and nifedipine 3074mwhich she took throughout her pregnancy).  Diabetes: She started Metformin in May, 2016.  Sugars in the mornings have been running 88-109 in the mornings. Max of 175 after Thanksgiving dinner. Denies hypoglycemia (only once had 68 when she went a long time without eating, a few weeks ago), polydipsia, polyuria. Last diabetic eye exam was over a year ago. No numbness, tingling, skin lesions, vision changes or other concerns.  H/o Vitamin D deficiency: she has had prescription replacement in the past.  Last check was 05/2015, level of 26. She currently is just taking prenatal vitamin daily, not an additional Vitamin D. Due for recheck.  OSA: She has been using her CPAP nightly.She gets a headache if she doesn't use it.She feels as refreshed as she can in the morning, with having an infant who is nursing.  Hypertension follow-up: Blood pressures haven't been checked very often recently. It went up to 160's/102 last week when she had forgotten to take her medication.  No headaches (unless she doesn't take her BP med or use CPAP).  No chest pain,  light headedness, syncope.  She hasn't been exercising, and admits her diet hasn't been great (+fast foods, higher sodium intake than normal). She has not been getting regular exercise.  Denies edema.  Some palpitations once or twice a week.  (sometimes after pumping breastmilk; has been improving overall). Prior to pregnancy she was taking amlodipine 5mg62md HCTZ 25mg74mreviously recalls taking losartan in the past as well.  H/o anemia:  Last CBC (below) was post-partum.  She isn't having regular cycles, but has been having daily spotting. She had a hormonal IUD placed last month. She takes an iron supplement twice a week, plus takes her PNV daily. Lab Results  Component Value Date   WBC 7.1 03/09/2016   HGB 10.1 (L) 03/09/2016   HCT 29.2 (L) 03/09/2016   MCV 77.0 (L) 03/09/2016   PLT 232 03/09/2016    PMH, PSH, SH reviewed and updated  Outpatient Encounter Prescriptions as of 05/13/2016  Medication Sig Note  . Ferrous Sulfate (IRON) 325 (65 Fe) MG TABS Take 1 tablet by mouth daily. 05/13/2016: Takes 2x/week  . ibuprofen (ADVIL,MOTRIN) 600 MG tablet Take 1 tablet (600 mg total) by mouth every 6 (six) hours as needed for headache, mild pain, moderate pain or cramping (mild pain). 05/13/2016: Uses prn headaches  . levonorgestrel (LILETTA) 18.6 MCG/DAY IUD IUD 1 each by Intrauterine route once. 05/13/2016: Inserted 03/2016; due to be changed 03/2021  . metFORMIN (GLUCOPHAGE) 500 MG tablet Take 1 tablet (500 mg total) by mouth 2 (two) times daily with a meal.   . NIFEdipine (PROCARDIA-XL/ADALAT-CC/NIFEDICAL-XL) 30 MG 24 hr tablet Take 30 mg by mouth daily.    . [DISCONTINUED] metFORMIN (GLUCOPHAGE) 500 MG tablet Take 1 tablet (500 mg total) by mouth 2 (two) times daily with a meal.   .  albuterol (PROVENTIL HFA;VENTOLIN HFA) 108 (90 Base) MCG/ACT inhaler Inhale into the lungs every 6 (six) hours as needed for wheezing or shortness of breath.   Marland Kitchen albuterol (PROVENTIL) (2.5 MG/3ML) 0.083% nebulizer solution Take 2.5 mg by nebulization every 6 (six) hours as needed for wheezing or shortness of breath.   . Prenatal Vit-Fe Fumarate-FA (PRENATAL MULTIVITAMIN) TABS tablet Take 1 tablet by mouth daily at 12 noon.    . [DISCONTINUED]  hydrocortisone cream 0.5 % Apply 1 application topically 2 (two) times daily.   . [DISCONTINUED] ondansetron (ZOFRAN) 4 MG tablet Take 1 tablet (4 mg total) by mouth every 6 (six) hours. (Patient not taking: Reported on 02/20/2016)   . [DISCONTINUED] oxyCODONE-acetaminophen (PERCOCET/ROXICET) 5-325 MG tablet Take 1-2 tablets by mouth every 6 (six) hours as needed for severe pain (moderate to severe pain (when tolerating fluids)).    No facility-administered encounter medications on file as of 05/13/2016.    Allergies  Allergen Reactions  . Dilaudid [Hydromorphone Hcl] Hives  . Morphine And Related Hives  . Peanut-Containing Drug Products Hives  . Strawberry Extract Swelling    Swelling is of the eye.   ROS: no fever, chills, URI symptoms, numbness, tingling, dizziness, chest pain, nausea, vomiting, bowel changes, edema, depression, bleeding, bruising.  +bumps on right lower leg/ankle.  See HPI    PHYSICAL EXAM:   BP (!) 130/98 (BP Location: Left Arm, Patient Position: Sitting, Cuff Size: Normal)   Pulse 72   Ht 5' 4"  (1.626 m)   Wt 288 lb 9.6 oz (130.9 kg)   Breastfeeding? Yes   BMI 49.54 kg/m   138/90 on repeat by MD  Well developed, pleasant, obese female in good spirits HEENT: PERRL, conjunctiva and sclera are clear Neck: no lymphadenopathy, thyromegaly or carotid bruit Heart: regular rate and rhythm Lungs: clear bilaterally Back: no CVA or spinal tenderness Abdomen: obese, soft, nontender, no organomegaly or mass Extremities: 2+ pulses, no edema.  Normal monofilament sensation.  Diabetic foot exam normal Right anterior lower shin (above the level of the ankle) there is some soft tissue nodular area--one is very small, firm and mobile (smaller than a bb), nontender, overlying an area that is a little more diffusely enlarged.  ? Atypical ganglion cyst vs other subcutaneous nodule.  Lab Results  Component Value Date   HGBA1C 6.5 05/13/2016    ASSESSMENT/PLAN:  Type 2  diabetes mellitus without complication, without long-term current use of insulin ([place]) - controlled - Plan: HgB A1c, Comprehensive metabolic panel, TSH, Microalbumin / creatinine urine ratio  Essential hypertension, benign - elevated today; low sodium diet, regular exercise, monitor regularly and contact us if remains elevated  OSA (obstructive sleep apnea) - continue CPAP  Vitamin D deficiency - due for recheck; taking PNV - Plan: VITAMIN D 25 Hydroxy (Vit-D Deficiency, Fractures)  Hyperlipidemia, unspecified hyperlipidemia type - Plan: Lipid panel  Medication monitoring encounter - Plan: CBC with Differential/Platelet, Comprehensive metabolic panel, VITAMIN D 25 Hydroxy (Vit-D Deficiency, Fractures)  Iron deficiency anemia, unspecified iron deficiency anemia type - Plan: CBC with Differential/Platelet   Subcutaneous lumps on RLE-- Given that it is nontender and of recent onset, will continue to monitor this area for any change. ?if could have been related to prior trauma (?) in which case may gradually resolve (ie hematoma, small area of calcification?)  HTN--elevated today. Admits to dietary noncompliance re: sodium, and no regular exercise.  Encouraged low sodium diet, regular exercise, continued weight loss, and more regular monitoring.  Will need med adjustment  if BP's remain elevated. At some point we can add back ARB since she now has IUD, but prefer to wait until she is no longer nursing. Avoid diuretics as these significantly decreased milk production for her in the past when used with prior child.   A1c CBC, c-met, lipid, TSH, urine microalb, Vit D   Continue your current dose of metformin. Schedule your yearly diabetic eye exam. Start monitoring your blood pressure a little more regularly (check daily over the next week, and contact us if it is consistently >135-140/85-90).   If you blood pressures are good (ideally 120's/70's), then you only need to check about once a week,  more often if you are feeling bad--headache, dizziness). Please resume a regular exercise regimen (at least 150 minutes each week of aerobic activity, in 10-15 minute intervals. Please cut back on the sodium in your diet, cut back on fast foods, eat lots of fruits and vegetables.  If diet and exercise don't get the blood pressures to goal, we may need to make medication adjustments.  Return in 1-2 months if blood pressures remain elevated (>135/85).  Feel free to email [place] through [facility] or fax [place] a list of your blood pressures.

## 2016-05-13 ENCOUNTER — Encounter: Payer: Self-pay | Admitting: Family Medicine

## 2016-05-13 ENCOUNTER — Ambulatory Visit (INDEPENDENT_AMBULATORY_CARE_PROVIDER_SITE_OTHER): Payer: 59 | Admitting: Family Medicine

## 2016-05-13 VITALS — BP 130/98 | HR 72 | Ht 64.0 in | Wt 288.6 lb

## 2016-05-13 DIAGNOSIS — G4733 Obstructive sleep apnea (adult) (pediatric): Secondary | ICD-10-CM | POA: Diagnosis not present

## 2016-05-13 DIAGNOSIS — E785 Hyperlipidemia, unspecified: Secondary | ICD-10-CM | POA: Diagnosis not present

## 2016-05-13 DIAGNOSIS — E559 Vitamin D deficiency, unspecified: Secondary | ICD-10-CM | POA: Diagnosis not present

## 2016-05-13 DIAGNOSIS — I10 Essential (primary) hypertension: Secondary | ICD-10-CM | POA: Diagnosis not present

## 2016-05-13 DIAGNOSIS — Z5181 Encounter for therapeutic drug level monitoring: Secondary | ICD-10-CM

## 2016-05-13 DIAGNOSIS — E119 Type 2 diabetes mellitus without complications: Secondary | ICD-10-CM

## 2016-05-13 DIAGNOSIS — D509 Iron deficiency anemia, unspecified: Secondary | ICD-10-CM | POA: Diagnosis not present

## 2016-05-13 LAB — CBC WITH DIFFERENTIAL/PLATELET
BASOS ABS: 0 {cells}/uL (ref 0–200)
Basophils Relative: 0 %
EOS ABS: 216 {cells}/uL (ref 15–500)
EOS PCT: 3 %
HCT: 38.3 % (ref 35.0–45.0)
Hemoglobin: 12.7 g/dL (ref 11.7–15.5)
LYMPHS PCT: 37 %
Lymphs Abs: 2664 cells/uL (ref 850–3900)
MCH: 26 pg — AB (ref 27.0–33.0)
MCHC: 33.2 g/dL (ref 32.0–36.0)
MCV: 78.5 fL — AB (ref 80.0–100.0)
MONOS PCT: 7 %
MPV: 9.3 fL (ref 7.5–12.5)
Monocytes Absolute: 504 cells/uL (ref 200–950)
NEUTROS ABS: 3816 {cells}/uL (ref 1500–7800)
Neutrophils Relative %: 53 %
PLATELETS: 338 10*3/uL (ref 140–400)
RBC: 4.88 MIL/uL (ref 3.80–5.10)
RDW: 15.8 % — AB (ref 11.0–15.0)
WBC: 7.2 10*3/uL (ref 4.0–10.5)

## 2016-05-13 LAB — COMPREHENSIVE METABOLIC PANEL
ALT: 14 U/L (ref 6–29)
AST: 15 U/L (ref 10–30)
Albumin: 4.2 g/dL (ref 3.6–5.1)
Alkaline Phosphatase: 77 U/L (ref 33–115)
BUN: 10 mg/dL (ref 7–25)
CO2: 27 mmol/L (ref 20–31)
CREATININE: 0.76 mg/dL (ref 0.50–1.10)
Calcium: 9.4 mg/dL (ref 8.6–10.2)
Chloride: 105 mmol/L (ref 98–110)
GLUCOSE: 77 mg/dL (ref 65–99)
POTASSIUM: 3.4 mmol/L — AB (ref 3.5–5.3)
SODIUM: 142 mmol/L (ref 135–146)
TOTAL PROTEIN: 7.2 g/dL (ref 6.1–8.1)
Total Bilirubin: 0.3 mg/dL (ref 0.2–1.2)

## 2016-05-13 LAB — LIPID PANEL
CHOL/HDL RATIO: 3.1 ratio (ref <5.0)
CHOLESTEROL: 196 mg/dL (ref <200)
HDL: 63 mg/dL (ref >50)
LDL CALC: 101 mg/dL — AB (ref <100)
Triglycerides: 158 mg/dL — ABNORMAL HIGH (ref <150)
VLDL: 32 mg/dL — AB (ref <30)

## 2016-05-13 LAB — POCT GLYCOSYLATED HEMOGLOBIN (HGB A1C): HEMOGLOBIN A1C: 6.5

## 2016-05-13 NOTE — Patient Instructions (Signed)
Continue your current dose of metformin. Schedule your yearly diabetic eye exam. Start monitoring your blood pressure a little more regularly (check daily over the next week, and contact us if it is consistently >135-140/85-90).   If you blood pressures are good (ideally 120's/70's), then you only need to check about once a week, more often if you are feeling bad--headache, dizziness). Please resume a regular exercise regimen (at least 150 minutes each week of aerobic activity, in 10-15 minute intervals. Please cut back on the sodium in your diet, cut back on fast foods, eat lots of fruits and vegetables.  If diet and exercise don't get the blood pressures to goal, we may need to make medication adjustments.  Return in 1-2 months if blood pressures remain elevated (>135/85).  Feel free to email [place] through [place] or fax [place] a list of your blood pressures.   Heart-Healthy Eating Plan Introduction Heart-healthy meal planning includes:  Limiting unhealthy fats.  Increasing healthy fats.  Making other small dietary changes. You may need to talk with your doctor or a diet specialist (dietitian) to create an eating plan that is right for you. What types of fat should I choose?  Choose healthy fats. These include olive oil and canola oil, flaxseeds, walnuts, almonds, and seeds.  Eat more omega-3 fats. These include salmon, mackerel, sardines, tuna, flaxseed oil, and ground flaxseeds. Try to eat fish at least twice each week.  Limit saturated fats.  Saturated fats are often found in animal products, such as meats, butter, and cream.  Plant sources of saturated fats include palm oil, palm kernel oil, and coconut oil.  Avoid foods with partially hydrogenated oils in them. These include stick margarine, some tub margarines, cookies, crackers, and other baked goods. These contain trans fats. What general guidelines do I need to follow?  Check food labels carefully. Identify foods with trans  fats or high amounts of saturated fat.  Fill one half of your plate with vegetables and green salads. Eat 4-5 servings of vegetables per day. A serving of vegetables is:  1 cup of raw leafy vegetables.   cup of raw or cooked cut-up vegetables.   cup of vegetable juice.  Fill one fourth of your plate with whole grains. Look for the word "whole" as the first word in the ingredient list.  Fill one fourth of your plate with lean protein foods.  Eat 4-5 servings of fruit per day. A serving of fruit is:  One medium whole fruit.   cup of dried fruit.   cup of fresh, frozen, or canned fruit.   cup of 100% fruit juice.  Eat more foods that contain soluble fiber. These include apples, broccoli, carrots, beans, peas, and barley. Try to get 20-30 g of fiber per day.  Eat more home-cooked food. Eat less restaurant, buffet, and fast food.  Limit or avoid alcohol.  Limit foods high in starch and sugar.  Avoid fried foods.  Avoid frying your food. Try baking, boiling, grilling, or broiling it instead. You can also reduce fat by:  Removing the skin from poultry.  Removing all visible fats from meats.  Skimming the fat off of stews, soups, and gravies before serving them.  Steaming vegetables in water or broth.  Lose weight if you are overweight.  Eat 4-5 servings of nuts, legumes, and seeds per week:  One serving of dried beans or legumes equals  cup after being cooked.  One serving of nuts equals 1 ounces.  One serving of  seeds equals  ounce or one tablespoon.  You may need to keep track of how much salt or sodium you eat. This is especially true if you have high blood pressure. Talk with your doctor or dietitian to get more information. What foods can I eat? Grains  Breads, including [place], white, pita, wheat, raisin, rye, oatmeal, and [place]. Tortillas that are neither fried nor made with lard or trans fat. Low-fat rolls, including hotdog and hamburger buns and  English muffins. Biscuits. Muffins. Waffles. Pancakes. Light popcorn. Whole-grain cereals. Flatbread. Melba toast. Pretzels. Breadsticks. Rusks. Low-fat snacks. Low-fat crackers, including oyster, saltine, matzo, graham, animal, and rye. Rice and pasta, including brown rice and pastas that are made with whole wheat. Vegetables  All vegetables. Fruits  All fruits, but limit coconut. Meats and Other Protein Sources  Lean, well-trimmed beef, veal, pork, and lamb. Chicken and [place] without skin. All fish and shellfish. Wild duck, rabbit, pheasant, and venison. Egg whites or low-cholesterol egg substitutes. Dried beans, peas, lentils, and tofu. Seeds and most nuts. Dairy  Low-fat or nonfat cheeses, including ricotta, string, and mozzarella. Skim or 1% milk that is liquid, powdered, or evaporated. Buttermilk that is made with low-fat milk. Nonfat or low-fat yogurt. Beverages  Mineral water. Diet carbonated beverages. Sweets and Desserts  Sherbets and fruit ices. Honey, jam, marmalade, jelly, and syrups. Meringues and gelatins. Pure sugar candy, such as hard candy, jelly beans, gumdrops, mints, marshmallows, and small amounts of dark chocolate. [employer] Eat all sweets and desserts in moderation. Fats and Oils  Nonhydrogenated (trans-free) margarines. Vegetable oils, including soybean, sesame, sunflower, olive, peanut, safflower, corn, canola, and cottonseed. Salad dressings or mayonnaise made with a vegetable oil. Limit added fats and oils that you use for cooking, baking, salads, and as spreads. Other  Cocoa powder. Coffee and tea. All seasonings and condiments. The items listed above may not be a complete list of recommended foods or beverages. Contact your dietitian for more options.  What foods are not recommended? Grains  Breads that are made with saturated or trans fats, oils, or whole milk. Croissants. Butter rolls. Cheese breads. Sweet rolls. Donuts. Buttered popcorn. Chow mein  noodles. High-fat crackers, such as cheese or butter crackers. Meats and Other Protein Sources  Fatty meats, such as hotdogs, short ribs, sausage, spareribs, bacon, rib eye roast or steak, and mutton. High-fat deli meats, such as salami and bologna. Caviar. Domestic duck and goose. Organ meats, such as kidney, liver, sweetbreads, and heart. Dairy  Cream, sour cream, cream cheese, and creamed cottage cheese. Whole-milk cheeses, including blue (bleu), Monte[place] Jack, [place], [place], American, [place], Swiss, cheddar, [place], and [place]. Whole or 2% milk that is liquid, evaporated, or condensed. Whole buttermilk. Cream sauce or high-fat cheese sauce. Yogurt that is made from whole milk. Beverages  Regular sodas and juice drinks with added sugar. Sweets and Desserts  Frosting. Pudding. Cookies. Cakes other than angel food cake. Candy that has milk chocolate or white chocolate, hydrogenated fat, butter, coconut, or unknown ingredients. Buttered syrups. Full-fat ice cream or ice cream drinks. Fats and Oils  Gravy that has suet, meat fat, or shortening. Cocoa butter, hydrogenated oils, palm oil, coconut oil, palm kernel oil. These can often be found in baked products, candy, fried foods, nondairy creamers, and whipped toppings. Solid fats and shortenings, including bacon fat, salt pork, lard, and butter. Nondairy cream substitutes, such as coffee creamers and sour cream substitutes. Salad dressings that are made of unknown oils, cheese, or sour cream. The  items listed above may not be a complete list of foods and beverages to avoid. Contact your dietitian for more information.  This information is not intended to replace advice given to you by your health care provider. Make sure you discuss any questions you have with your health care provider. Document Released: 11/12/2011 Document Revised: 10/19/2015 Document Reviewed: 11/04/2013  2017 Elsevier

## 2016-05-14 LAB — MICROALBUMIN / CREATININE URINE RATIO
CREATININE, URINE: 134 mg/dL (ref 20–320)
Microalb Creat Ratio: 151 mcg/mg creat — ABNORMAL HIGH (ref <30)
Microalb, Ur: 20.2 mg/dL

## 2016-05-14 LAB — TSH: TSH: 1.16 m[IU]/L

## 2016-05-14 LAB — VITAMIN D 25 HYDROXY (VIT D DEFICIENCY, FRACTURES): VIT D 25 HYDROXY: 28 ng/mL — AB (ref 30–100)

## 2016-05-27 ENCOUNTER — Emergency Department (HOSPITAL_COMMUNITY)
Admission: EM | Admit: 2016-05-27 | Discharge: 2016-05-28 | Disposition: A | Discharge status: Home / Self Care | Payer: 59 | Attending: Emergency Medicine | Admitting: Emergency Medicine

## 2016-05-27 ENCOUNTER — Encounter (HOSPITAL_COMMUNITY): Payer: Self-pay

## 2016-05-27 DIAGNOSIS — E119 Type 2 diabetes mellitus without complications: Secondary | ICD-10-CM | POA: Diagnosis not present

## 2016-05-27 DIAGNOSIS — R002 Palpitations: Secondary | ICD-10-CM | POA: Diagnosis not present

## 2016-05-27 DIAGNOSIS — Z91010 Allergy to peanuts: Secondary | ICD-10-CM | POA: Insufficient documentation

## 2016-05-27 DIAGNOSIS — Z79899 Other long term (current) drug therapy: Secondary | ICD-10-CM | POA: Diagnosis not present

## 2016-05-27 DIAGNOSIS — R079 Chest pain, unspecified: Secondary | ICD-10-CM

## 2016-05-27 DIAGNOSIS — R0789 Other chest pain: Secondary | ICD-10-CM | POA: Diagnosis not present

## 2016-05-27 DIAGNOSIS — J45909 Unspecified asthma, uncomplicated: Secondary | ICD-10-CM | POA: Diagnosis not present

## 2016-05-27 DIAGNOSIS — I10 Essential (primary) hypertension: Secondary | ICD-10-CM | POA: Insufficient documentation

## 2016-05-27 DIAGNOSIS — Z7984 Long term (current) use of oral hypoglycemic drugs: Secondary | ICD-10-CM | POA: Insufficient documentation

## 2016-05-27 LAB — I-STAT TROPONIN, ED: TROPONIN I, POC: 0 ng/mL (ref 0.00–0.08)

## 2016-05-27 LAB — BASIC METABOLIC PANEL
ANION GAP: 10 (ref 5–15)
BUN: 11 mg/dL (ref 6–20)
CALCIUM: 9.1 mg/dL (ref 8.9–10.3)
CO2: 26 mmol/L (ref 22–32)
CREATININE: 0.96 mg/dL (ref 0.44–1.00)
Chloride: 105 mmol/L (ref 101–111)
GFR calc non Af Amer: >60 mL/min (ref >60)
Glucose, Bld: 117 mg/dL — ABNORMAL HIGH (ref 65–99)
Potassium: 3.3 mmol/L — ABNORMAL LOW (ref 3.5–5.1)
SODIUM: 141 mmol/L (ref 135–145)

## 2016-05-27 LAB — CBC
HCT: 32.8 % — ABNORMAL LOW (ref 36.0–46.0)
HEMOGLOBIN: 11.2 g/dL — AB (ref 12.0–15.0)
MCH: 26.2 pg (ref 26.0–34.0)
MCHC: 34.1 g/dL (ref 30.0–36.0)
MCV: 76.8 fL — ABNORMAL LOW (ref 78.0–100.0)
PLATELETS: 265 10*3/uL (ref 150–400)
RBC: 4.27 MIL/uL (ref 3.87–5.11)
RDW: 14.5 % (ref 11.5–15.5)
WBC: 6.7 10*3/uL (ref 4.0–10.5)

## 2016-05-27 NOTE — ED Triage Notes (Signed)
Pt states she started having heart palpitations and elevated BP a couple of hours ago; pt states symptoms came on suddenly; home bp was 183/111; pt took barcardia 30mg  prior to arrival; pt denies pain at triage but states discomfort starts in neck and goes down left side; pt a&o x 4 on arrival.

## 2016-05-27 NOTE — ED Notes (Addendum)
Patient transported to X-Ray 

## 2016-05-28 ENCOUNTER — Emergency Department (HOSPITAL_COMMUNITY): Payer: 59

## 2016-05-28 DIAGNOSIS — Z91010 Allergy to peanuts: Secondary | ICD-10-CM | POA: Diagnosis not present

## 2016-05-28 DIAGNOSIS — R079 Chest pain, unspecified: Secondary | ICD-10-CM | POA: Diagnosis not present

## 2016-05-28 DIAGNOSIS — J45909 Unspecified asthma, uncomplicated: Secondary | ICD-10-CM | POA: Diagnosis not present

## 2016-05-28 DIAGNOSIS — I10 Essential (primary) hypertension: Secondary | ICD-10-CM | POA: Diagnosis not present

## 2016-05-28 DIAGNOSIS — Z7984 Long term (current) use of oral hypoglycemic drugs: Secondary | ICD-10-CM | POA: Diagnosis not present

## 2016-05-28 DIAGNOSIS — R0789 Other chest pain: Secondary | ICD-10-CM | POA: Diagnosis not present

## 2016-05-28 DIAGNOSIS — Z79899 Other long term (current) drug therapy: Secondary | ICD-10-CM | POA: Diagnosis not present

## 2016-05-28 DIAGNOSIS — E119 Type 2 diabetes mellitus without complications: Secondary | ICD-10-CM | POA: Diagnosis not present

## 2016-05-28 LAB — I-STAT TROPONIN, ED: TROPONIN I, POC: 0 ng/mL (ref 0.00–0.08)
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
## 2016-05-30 MED FILL — metFORMIN HCL 500 MG TABS: 500 | 30 days supply | Qty: 60 | Fill #2

## 2016-05-30 MED FILL — NIFEDIPINE ER 30 MG TABLET: 30 | 30 days supply | Qty: 30 | Fill #6

## 2016-07-11 ENCOUNTER — Ambulatory Visit (INDEPENDENT_AMBULATORY_CARE_PROVIDER_SITE_OTHER): Payer: 59 | Admitting: Family Medicine

## 2016-07-11 ENCOUNTER — Encounter: Payer: Self-pay | Admitting: Family Medicine

## 2016-07-11 VITALS — BP 140/84 | HR 76 | Ht 64.0 in | Wt 290.0 lb

## 2016-07-11 DIAGNOSIS — J4521 Mild intermittent asthma with (acute) exacerbation: Secondary | ICD-10-CM | POA: Diagnosis not present

## 2016-07-11 DIAGNOSIS — T7840XA Allergy, unspecified, initial encounter: Secondary | ICD-10-CM | POA: Diagnosis not present

## 2016-07-11 MED ORDER — ALBUTEROL SULFATE HFA 108 (90 BASE) MCG/ACT IN AERS: 2.0000 | INHALATION_SPRAY | Freq: Four times a day (QID) | RESPIRATORY_TRACT | 1 refills | Status: DC | PRN

## 2016-07-11 MED ORDER — DIPHENHYDRAMINE HCL 25 MG PO CAPS
25.0000 mg | ORAL_CAPSULE | Freq: Once | ORAL | Status: AC
  Administered 2016-07-11: 25 mg via ORAL

## 2016-07-11 MED ORDER — IPRATROPIUM-ALBUTEROL 0.5-2.5 (3) MG/3ML IN SOLN
3.0000 mL | Freq: Once | RESPIRATORY_TRACT | Status: AC
  Administered 2016-07-11: 3 mL via RESPIRATORY_TRACT

## 2016-07-11 MED ORDER — ALBUTEROL SULFATE (2.5 MG/3ML) 0.083% IN NEBU: 2.5000 mg | INHALATION_SOLUTION | Freq: Four times a day (QID) | RESPIRATORY_TRACT | 0 refills | Status: DC | PRN

## 2016-07-11 MED FILL — ALBUTEROL 0.083% INHAL SOLN: (2.5 MG/3ML | 7 days supply | Qty: 75 | Fill #0

## 2016-07-11 MED FILL — VENTOLIN HFA 90 MCG INHALER: 108 (90 BAS | 25 days supply | Qty: 18 | Fill #0

## 2016-07-11 NOTE — Progress Notes (Signed)
Chief Complaint  Patient presents with  . Shortness of Breath    started about 2 hrs ago-someone was at her house fixing something with mold in it and triggered a reaction. Chest hurts. Chest is tight and she is out of neb meds and rescue inhaler.    Patient presents as walk-in with compaint of shortness of breath related to mold exposure (known allergy) in her home. She has been having an issue with washer--had been leaking/overflowing.  While maintenance man was there, she suddenly got very short of breath.   He was taking down a wall, and she saw a lot of black mold where he took the wall down.  He was spraying it, and plans to return tomorrow for additional treatment/fixing. She has a known allergy to mold.  She had been having some headaches (frontal) whenever she was home, felt better out of the house. No runny nose or sinus congestion, just the pain in the forehead and behind her eyes.   Has a CO detector.  H/o asthma, but hasn't had a problem in 2 years. She doesn't currently have any medication at home.  Has a nebulizer machine but no medication.  She will need a new mask/tubing.  PMH, PSH, SH reviewed  Outpatient Encounter Prescriptions as of 07/11/2016  Medication Sig Note  . levonorgestrel (LILETTA) 18.6 MCG/DAY IUD IUD 1 each by Intrauterine route once. 05/13/2016: Inserted 03/2016; due to be changed 03/2021  . albuterol (PROVENTIL HFA;VENTOLIN HFA) 108 (90 Base) MCG/ACT inhaler Inhale 2 puffs into the lungs every 6 (six) hours as needed for wheezing or shortness of breath.   Marland Kitchen albuterol (PROVENTIL) (2.5 MG/3ML) 0.083% nebulizer solution Take 3 mLs (2.5 mg total) by nebulization every 6 (six) hours as needed for wheezing or shortness of breath.   . Ferrous Sulfate (IRON) 325 (65 Fe) MG TABS Take 1 tablet by mouth 2 (two) times a week.    Marland Kitchen ibuprofen (ADVIL,MOTRIN) 600 MG tablet Take 1 tablet (600 mg total) by mouth every 6 (six) hours as needed for headache, mild pain, moderate pain  or cramping (mild pain). (Patient not taking: Reported on 05/28/2016)   . metFORMIN (GLUCOPHAGE) 500 MG tablet Take 1 tablet (500 mg total) by mouth 2 (two) times daily with a meal. (Patient not taking: Reported on 07/11/2016)   . NIFEdipine (PROCARDIA-XL/ADALAT-CC/NIFEDICAL-XL) 30 MG 24 hr tablet Take 30 mg by mouth daily.    . Prenatal Vit-Fe Fumarate-FA (PRENATAL MULTIVITAMIN) TABS tablet Take 1 tablet by mouth daily at 12 noon.    . [DISCONTINUED] albuterol (PROVENTIL HFA;VENTOLIN HFA) 108 (90 Base) MCG/ACT inhaler Inhale into the lungs every 6 (six) hours as needed for wheezing or shortness of breath.   . [DISCONTINUED] albuterol (PROVENTIL) (2.5 MG/3ML) 0.083% nebulizer solution Take 2.5 mg by nebulization every 6 (six) hours as needed for wheezing or shortness of breath.   . [EXPIRED] diphenhydrAMINE (BENADRYL) capsule 25 mg    . [EXPIRED] diphenhydrAMINE (BENADRYL) capsule 25 mg    . [EXPIRED] ipratropium-albuterol (DUONEB) 0.5-2.5 (3) MG/3ML nebulizer solution 3 mL     No facility-administered encounter medications on file as of 07/11/2016.    Allergies  Allergen Reactions  . Dilaudid [Hydromorphone Hcl] Hives  . Morphine And Related Hives  . Peanut-Containing Drug Products Hives  . Strawberry Extract Swelling    Swelling is of the eye.   ROS: No fever, chlls, no discolored mucus.  Denies cough. No bleeding, bruising, rash, chest pain.  +dyspnea and chest tightness. No nausea, vomiting,  diarrhea  PHYSICAL EXAM: BP (!) 160/80 (BP Location: Right Arm, Patient Position: Sitting, Cuff Size: Normal)   Pulse 76   Ht 5\' 4"  (1.626 m)   Wt 290 lb (131.5 kg)   SpO2 99%   BMI 49.78 kg/m  breastfeeding 140/84 on repeat by MD  Obese female--on presentation she was breathing quickly (RR20), and it was hard for her to speak.  Her lungs at this time actually sounded clear, with fair air movement.  Given the acute onset of symptoms with known mold allergy, she was treated with duoneb, and 50mg   of benadryl (she reported her mother could drive home if drowsy from medication, here with her at visit).  She felt remarkably better after this treatment.  She felt back to baseline.  HEENT: PERRL, EOMI, conjunctiva and sclera were clear. OP clear, without erythema or swelling Neck: no lymphadenopathy or mass Heart: regular rate and rhythm Lungs: clear bilaterally. No wheezes, rales, ronchi.   Extremities: no edema Skin: normal turgor, no urticaria, rash Psych: normal mood, affect, hygiene and grooming  ASSESSMENT/PLAN  Allergic reaction, initial encounter - to mold exposure, with shortness of breath. responded nicely to duoneb and benadryl. avoid additional exposure - Plan: diphenhydrAMINE (BENADRYL) capsule 25 mg, diphenhydrAMINE (BENADRYL) capsule 25 mg  Intermittent asthma with acute exacerbation, unspecified asthma severity - refilled MDI and nebulizer. To sleep at her mom's house tonight. - Plan: ipratropium-albuterol (DUONEB) 0.5-2.5 (3) MG/3ML nebulizer solution 3 mL, albuterol (PROVENTIL HFA;VENTOLIN HFA) 108 (90 Base) MCG/ACT inhaler, albuterol (PROVENTIL) (2.5 MG/3ML) 0.083% nebulizer solution  HTN--high initially, when short of breath and anxious. Improved on recheck (borderline).   Plans to stay at her mom's house tonight. They will be returning to work on remedying the mold situation tomorrow.  Use the albuterol nebulizer and/or inhaler every 6 hours as needed for wheezing. Consider using antihistamine (claritin/zyrtec) for allergic reactions as well (may potentially decrease milk supply some).

## 2016-07-11 NOTE — Patient Instructions (Signed)
  Use the albuterol nebulizer and/or inhaler every 6 hours as needed for wheezing. Consider using antihistamine (claritin/zyrtec) for allergic reactions as well (may potentially decrease milk supply some).  If ongoing problems with asthma flare, you may need to use some prednisone

## 2016-07-18 ENCOUNTER — Other Ambulatory Visit: Payer: Self-pay | Admitting: *Deleted

## 2016-07-18 ENCOUNTER — Telehealth: Payer: Self-pay

## 2016-07-18 ENCOUNTER — Encounter: Payer: Self-pay | Admitting: Family Medicine

## 2016-07-18 ENCOUNTER — Ambulatory Visit (INDEPENDENT_AMBULATORY_CARE_PROVIDER_SITE_OTHER): Payer: 59 | Admitting: Family Medicine

## 2016-07-18 VITALS — BP 170/100 | HR 68 | Ht 64.0 in | Wt 289.2 lb

## 2016-07-18 DIAGNOSIS — I10 Essential (primary) hypertension: Secondary | ICD-10-CM

## 2016-07-18 DIAGNOSIS — R001 Bradycardia, unspecified: Secondary | ICD-10-CM | POA: Diagnosis not present

## 2016-07-18 DIAGNOSIS — R002 Palpitations: Secondary | ICD-10-CM

## 2016-07-18 MED ORDER — METFORMIN HCL 500 MG PO TABS: 500.0000 mg | ORAL_TABLET | Freq: Two times a day (BID) | ORAL | 0 refills | Status: DC

## 2016-07-18 MED ORDER — AMLODIPINE BESYLATE 10 MG PO TABS: ORAL_TABLET | ORAL | 1 refills | Status: DC

## 2016-07-18 MED FILL — AMLODIPINE BESYLATE 10 MG T: 10 | 30 days supply | Qty: 30 | Fill #0

## 2016-07-18 NOTE — Patient Instructions (Signed)
  Start amlodipine 10 mg tablet--start by taking only 1/2 tablet by mouth once daily.  Continue to monitor your blood pressure.  After a week, if your blood pressure is consistently over 140/90, increase to the full pill.  Return in 3-4 weeks with your list of blood pressures.  Keep a chart showing columns for date/morning/evening/comments Use the comments for any symptoms--headache, dizziness, and to document any med changes (doubled dose, forgot pill).  Can also note things like "after exercise" "chinese food yesterday"

## 2016-07-18 NOTE — Progress Notes (Signed)
Chief Complaint  Patient presents with  . Hypertension    having some high blood pressure readings. Having heart flutter issues, she thinks due to her procardia that she is taking (due to breastfeeding).    She is describing "flutters" in her chest.  She went to ER for this on 05/27/16, but everything checked out okay. She states her pulse was "down", from the procardia. Chart reviewed--pulse was 55 with BP 155/99 in ER. K+ noted to be 3.3 in ER.  Over the past few weeks BP has been up to 150/97 (while on the procardia), but lower at 128-135/85-90 much of the time.  She didn't record the pulse.    She ran out of procardia, and she noticed that the tightness and fluttering in chest got better. When she found some more at home, she took it and noticed the fluttering and tightness recurring in her chest.  Previously on amlodipine 5mg  and HCTZ (up until her last pregnancy). This controlled her blood pressure well. Previously recalls that HCTZ dried up  breast milk in the past.    PMH,PSH, SH reviewed   Outpatient Encounter Prescriptions as of 07/18/2016  Medication Sig Note  . Ferrous Sulfate (IRON) 325 (65 Fe) MG TABS Take 1 tablet by mouth 2 (two) times a week.    Marland Kitchen levonorgestrel (LILETTA) 18.6 MCG/DAY IUD IUD 1 each by Intrauterine route once. 05/13/2016: Inserted 03/2016; due to be changed 03/2021  . metFORMIN (GLUCOPHAGE) 500 MG tablet Take 1 tablet (500 mg total) by mouth 2 (two) times daily with a meal.   . Prenatal Vit-Fe Fumarate-FA (PRENATAL MULTIVITAMIN) TABS tablet Take 1 tablet by mouth daily at 12 noon.    Marland Kitchen albuterol (PROVENTIL HFA;VENTOLIN HFA) 108 (90 Base) MCG/ACT inhaler Inhale 2 puffs into the lungs every 6 (six) hours as needed for wheezing or shortness of breath. (Patient not taking: Reported on 07/18/2016)   . albuterol (PROVENTIL) (2.5 MG/3ML) 0.083% nebulizer solution Take 3 mLs (2.5 mg total) by nebulization every 6 (six) hours as needed for wheezing or shortness of breath.  (Patient not taking: Reported on 07/18/2016)   . ibuprofen (ADVIL,MOTRIN) 600 MG tablet Take 1 tablet (600 mg total) by mouth every 6 (six) hours as needed for headache, mild pain, moderate pain or cramping (mild pain). (Patient not taking: Reported on 05/28/2016)   . NIFEdipine (PROCARDIA-XL/ADALAT-CC/NIFEDICAL-XL) 30 MG 24 hr tablet Take 30 mg by mouth daily.     No facility-administered encounter medications on file as of 07/18/2016.    Last dose of procardia was last week  Allergies  Allergen Reactions  . Dilaudid [Hydromorphone Hcl] Hives  . Morphine And Related Hives  . Peanut-Containing Drug Products Hives  . Strawberry Extract Swelling    Swelling is of the eye.    ROS:  No fever, chills. Denies headaches, dizziness, chest pain, shortness of breath, no palpitations since being off the procardia. No URI symptoms, GI symptoms No further breathing problems or need for albuterol. Slight stuffy nose.  PHYSICAL EXAM:  BP (!) 170/100 (BP Location: Left Arm, Patient Position: Sitting, Cuff Size: Normal)   Pulse 68   Ht 5\' 4"  (1.626 m)   Wt 289 lb 3.2 oz (131.2 kg)   BMI 49.64 kg/m    174/104 on repeat by MD, right arm. Pulse 60   Well appearing, pleasant female in no distress Neck: no lymphadenopathy or mass Heart: regular rate and rhythm, no murmur Lungs: clear bilaterally Extremities: no edema Skin: normal turgor, no rash Psych:  normal mood, affect, hygiene and grooming Neuro: alert and oriented, cranial nerves intact, normal gait.   ASSESSMENT/PLAN:  Essential hypertension, benign - uncontrolled--off meds; stop procardia (causing bradycardia, palpitations). start amlodipine 5mg  and increase to 10mg  after 1wk if remains high - Plan: amLODipine (NORVASC) 10 MG tablet  Bradycardia - related to procardia use.  resting heart rate 60 off meds today. avoid meds which will lower pulse  Palpitations - related to procardia use, resolved after stopping the medication   Start  amlodipine 10 mg tablet--start by taking only 1/2 tablet by mouth once daily.  Continue to monitor your blood pressure.  After a week, if your blood pressure is consistently over 140/90, increase to the full pill.  Return in 3-4 weeks with your list of blood pressures.

## 2016-07-18 NOTE — Telephone Encounter (Signed)
Done

## 2016-07-18 NOTE — Telephone Encounter (Signed)
Pt needs refill of Metformin 500 mg tablet to [facility]. [name]

## 2016-07-25 ENCOUNTER — Encounter (HOSPITAL_COMMUNITY): Payer: Self-pay | Admitting: *Deleted

## 2016-07-25 ENCOUNTER — Inpatient Hospital Stay (HOSPITAL_COMMUNITY)
Admission: AD | Admit: 2016-07-25 | Discharge: 2016-07-25 | Disposition: A | Discharge status: Home / Self Care | Payer: 59 | Source: Ambulatory Visit | Attending: Obstetrics and Gynecology | Admitting: Obstetrics and Gynecology

## 2016-07-25 DIAGNOSIS — Z7984 Long term (current) use of oral hypoglycemic drugs: Secondary | ICD-10-CM | POA: Insufficient documentation

## 2016-07-25 DIAGNOSIS — J45909 Unspecified asthma, uncomplicated: Secondary | ICD-10-CM | POA: Insufficient documentation

## 2016-07-25 DIAGNOSIS — R1084 Generalized abdominal pain: Secondary | ICD-10-CM | POA: Diagnosis not present

## 2016-07-25 DIAGNOSIS — E119 Type 2 diabetes mellitus without complications: Secondary | ICD-10-CM | POA: Insufficient documentation

## 2016-07-25 DIAGNOSIS — N939 Abnormal uterine and vaginal bleeding, unspecified: Secondary | ICD-10-CM | POA: Diagnosis not present

## 2016-07-25 DIAGNOSIS — T839XXA Unspecified complication of genitourinary prosthetic device, implant and graft, initial encounter: Secondary | ICD-10-CM | POA: Insufficient documentation

## 2016-07-25 DIAGNOSIS — Z3202 Encounter for pregnancy test, result negative: Secondary | ICD-10-CM | POA: Diagnosis not present

## 2016-07-25 LAB — CBC
HEMATOCRIT: 36.2 % (ref 36.0–46.0)
Hemoglobin: 12.6 g/dL (ref 12.0–15.0)
MCH: 26.6 pg (ref 26.0–34.0)
MCHC: 34.8 g/dL (ref 30.0–36.0)
MCV: 76.5 fL — ABNORMAL LOW (ref 78.0–100.0)
PLATELETS: 289 10*3/uL (ref 150–400)
RBC: 4.73 MIL/uL (ref 3.87–5.11)
RDW: 14.8 % (ref 11.5–15.5)
WBC: 6.8 10*3/uL (ref 4.0–10.5)

## 2016-07-25 LAB — POCT PREGNANCY, URINE: Preg Test, Ur: NEGATIVE

## 2016-07-25 NOTE — MAU Note (Signed)
Patient arrived via ems with c/o vaginal bleeding x2 days with clots. Patient states she felt like IUD was hanging out. Endorses feeling dizzy with blurred vision and like she was going to pass out. Pad noted with dark red blood and quarter size clot upon arrival; iud on pad as well.
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
## 2016-07-30 MED FILL — metFORMIN HCL 500 MG TABS: 500 | 30 days supply | Qty: 60 | Fill #0

## 2016-08-13 MED FILL — MEDROXYPROG 150 MG/ML SYR: 150 | 84 days supply | Qty: 1 | Fill #0

## 2016-08-15 DIAGNOSIS — Z30013 Encounter for initial prescription of injectable contraceptive: Secondary | ICD-10-CM | POA: Diagnosis not present

## 2016-09-26 ENCOUNTER — Telehealth: Payer: Self-pay

## 2016-09-26 ENCOUNTER — Other Ambulatory Visit: Payer: Self-pay | Admitting: Family Medicine

## 2016-09-26 ENCOUNTER — Other Ambulatory Visit: Payer: Self-pay | Admitting: *Deleted

## 2016-09-26 MED ORDER — FREESTYLE LANCETS MISC: 1.0000 | Freq: Two times a day (BID) | 5 refills | Status: DC

## 2016-09-26 MED ORDER — GLUCOSE BLOOD VI STRP: 1.0000 | ORAL_STRIP | Freq: Two times a day (BID) | 5 refills | Status: DC

## 2016-09-26 MED ORDER — FREESTYLE LITE DEVI: 1.0000 | Freq: Two times a day (BID) | 0 refills | Status: DC

## 2016-09-26 MED FILL — AMLODIPINE BESYLATE 10 MG T: 10 | 30 days supply | Qty: 30 | Fill #1

## 2016-09-26 MED FILL — FREESTYLE LITE TEST STRIP: 50 days supply | Qty: 100 | Fill #0

## 2016-09-26 MED FILL — metFORMIN HCL 500 MG TABS: 500 | 30 days supply | Qty: 60 | Fill #0

## 2016-09-26 MED FILL — FREESTYLE LITE METER: 30 days supply | Qty: 1 | Fill #0

## 2016-09-26 MED FILL — FREESTYLE LANCETS: 50 days supply | Qty: 100 | Fill #0

## 2016-09-26 NOTE — Telephone Encounter (Signed)
Done

## 2016-09-26 NOTE — Telephone Encounter (Signed)
Fax request from [facility], [place]  Asking for scripts of Freestyle Lite Meter, Strips, and Lancets. They states this is the only meter covered by [facility] insurance this year. [name]

## 2016-10-08 NOTE — Progress Notes (Signed)
Chief Complaint  Patient presents with  . Diabetes    fasting (light breakfast 10am) med check. No concerns.    Patient presents to follow up on her hypertension.  She was last seen here in February with complaints of palpitations/fluttering and bradycardia.  The procardia was stopped, and she was changed to amlodipine 49m.  Advised to increase to 168mif BP's remained elevated. She did not need to increase the dose, as BP's at home are okay.  She missed last night's dose of medication due to staying at her parent's house.  BP's at home have been 128/79, highest was 140/85, mostly 130's/low 80's.   She previously had blood pressure well controlled with amlodipine [id number] HCTZ (prior to last pregnancy), but recalls HCTZ causing issues with breast-feeding in the past, so this regimen wasn't restarted, just the amlodipine.   She continues to breastfeed. She denies headaches, dizziness, chest pain, shortness of breath, edema.   Diabetes: She started Metformin in May, 2016. She has some hypoglycemic symptoms whenever her sugar is 80 or lower, which has been frequently lately, in the mornings. Lowest sugar has been 73.  Usually morning sugars are ranging 90-110.  They are a little higher in the evenings, up to 150's. She is currently taking the morning pill before breakfast, and evening pill before bedtime. Denies polydipsia, polyuria (she notices peeing a lot more in the evenings). Last diabetic eye exam was over a year ago (her doctor retired, needs to find a new one). No numbness, tingling, skin lesions, vision changes or other concerns. She was noted to have microalbuminuria in 04/2016.  Not a candidate for ACEI/ARB due to nursing.  H/o Vitamin D deficiency: she has had prescription replacement in the past.  Last check was 28 in 04/2016 She currently is still just taking prenatal vitamin daily, not an additional Vitamin D.   OSA: Her CPAP machine isn't working.  She hasn't called [facility]  yet.  Hasn't been working for a couple of months.  She does report feeling more fatigued, not getting headaches like in the past.  +daytime somnolence--not falling asleep though. She previously was very compliant in using her CPAP nightly.   PMH ,PSH, SH reviewed  Outpatient Encounter Prescriptions as of 10/09/2016  Medication Sig Note  . amLODipine (NORVASC) 10 MG tablet Take 5 mg by mouth daily. 10/09/2016: Takes 1/2 tablet  . Blood Glucose Monitoring Suppl (FREESTYLE LITE) DEVI 1 each by Does not apply route 2 (two) times daily after a meal.   . glucose blood (FREESTYLE LITE) test strip 1 each by Other route 2 (two) times daily after a meal. Use as instructed   . Lancets (FREESTYLE) lancets 1 each by Other route 2 (two) times daily after a meal. Use as instructed   . metFORMIN (GLUCOPHAGE) 500 MG tablet TAKE 1 TABLET BY MOUTH 2 TIMES DAILY WITH A MEAL.   . PMarland Kitchenenatal Vit-Fe Fumarate-FA (PRENATAL MULTIVITAMIN) TABS tablet Take 1 tablet by mouth daily at 12 noon.    . [DISCONTINUED] metFORMIN (GLUCOPHAGE) 500 MG tablet TAKE 1 TABLET BY MOUTH 2 TIMES DAILY WITH A MEAL.   . aMarland Kitchenbuterol (PROVENTIL HFA;VENTOLIN HFA) 108 (90 Base) MCG/ACT inhaler Inhale 2 puffs into the lungs every 6 (six) hours as needed for wheezing or shortness of breath. (Patient not taking: Reported on 10/09/2016)   . albuterol (PROVENTIL) (2.5 MG/3ML) 0.083% nebulizer solution Take 3 mLs (2.5 mg total) by nebulization every 6 (six) hours as needed for wheezing or shortness  of breath. (Patient not taking: Reported on 10/09/2016)   . ibuprofen (ADVIL,MOTRIN) 600 MG tablet Take 1 tablet (600 mg total) by mouth every 6 (six) hours as needed for headache, mild pain, moderate pain or cramping (mild pain). (Patient not taking: Reported on 10/09/2016)    No facility-administered encounter medications on file as of 10/09/2016.    Allergies  Allergen Reactions  . Dilaudid [Hydromorphone Hcl] Hives  . Morphine And Related Hives  .  Peanut-Containing Drug Products Hives  . Strawberry Extract Swelling    Swelling is of the eye.   ROS:  No fever, chills.  Some allergy symptoms recently. No cough, shortness of breath, chest pain, GI or GU complaints. No bleeding, bruising, rash, edema.  See HPI for details.   PHYSICAL EXAM:  BP (!) 142/108 (BP Location: Left Arm, Patient Position: Sitting, Cuff Size: Normal)   Pulse 80   Ht 5' 4"  (1.626 m)   Wt 286 lb (129.7 kg)   BMI 49.09 kg/m   138/94 on repeat by MD, laying down  Wt Readings from Last 3 Encounters:  10/09/16 286 lb (129.7 kg)  07/25/16 289 lb (131.1 kg)  07/18/16 289 lb 3.2 oz (131.2 kg)    Well appearing, pleasant female in no distress HEENT: conjunctiva and sclera clear, OP clear Neck: no lymphadenopathy or mass Heart: regular rate and rhythm, no murmur Lungs: clear bilaterally Abdomen: soft, nontender, no mass. Obese. Extremities: no edema, normal pulses Skin: normal turgor, no rash Psych: normal mood, affect, hygiene and grooming Neuro: alert and oriented, cranial nerves intact, normal gait.   Lab Results  Component Value Date   HGBA1C 6.1 10/09/2016    ASSESSMENT/PLAN:  Essential hypertension, benign - elevated today (missed dose of med); okay at home. Continue 46m amlodipine. Exercise, weight loss, get back on CPAP - Plan: Aldosterone  OSA (obstructive sleep apnea) - CPAP broken--encouraged her to call and get replaced. Risks of untreated OSA reviewed  Controlled type 2 diabetes mellitus with microalbuminuria, without long-term current use of insulin (HCC) - change PM dosing of metformin to prior to dinner.  Will need ACEI/ARB when no longer nursing. DM controlled - Plan: HgB A1c, metFORMIN (GLUCOPHAGE) 500 MG tablet  Hypokalemia - a few times in past, even when not ill or on diuretic.   - Plan: Aldosterone, Basic metabolic panel   H/o low K with HTN-- b-met and aldosterone level  Will need ACEI/ARB and statin when not  nursing.  F/u 6 months, fasting    Change your evening metformin dose time to before dinner rather than before bedtime.  Don't skip a morning dose if the sugar is low (as long as it is over 70, and you are eating, it is fine.)  Please start taking an additional Vitamin D3 1000 IU every day, along with your prenatal vitamin.  Please call [facility] and get set up with a new CPAP machine so you can get back to using it regularly and feeling better!  Please call and arrange for your yearly diabetic eye exam (and make sure they [place] of the report).

## 2016-10-09 ENCOUNTER — Ambulatory Visit (INDEPENDENT_AMBULATORY_CARE_PROVIDER_SITE_OTHER): Payer: 59 | Admitting: Family Medicine

## 2016-10-09 ENCOUNTER — Encounter: Payer: Self-pay | Admitting: Family Medicine

## 2016-10-09 VITALS — BP 138/94 | HR 80 | Ht 64.0 in | Wt 286.0 lb

## 2016-10-09 DIAGNOSIS — E1129 Type 2 diabetes mellitus with other diabetic kidney complication: Secondary | ICD-10-CM

## 2016-10-09 DIAGNOSIS — I10 Essential (primary) hypertension: Secondary | ICD-10-CM | POA: Diagnosis not present

## 2016-10-09 DIAGNOSIS — R809 Proteinuria, unspecified: Secondary | ICD-10-CM | POA: Diagnosis not present

## 2016-10-09 DIAGNOSIS — G4733 Obstructive sleep apnea (adult) (pediatric): Secondary | ICD-10-CM

## 2016-10-09 DIAGNOSIS — E876 Hypokalemia: Secondary | ICD-10-CM | POA: Diagnosis not present

## 2016-10-09 LAB — POCT GLYCOSYLATED HEMOGLOBIN (HGB A1C): Hemoglobin A1C: 6.1

## 2016-10-09 LAB — BASIC METABOLIC PANEL
BUN: 9 mg/dL (ref 7–25)
CHLORIDE: 106 mmol/L (ref 98–110)
CO2: 24 mmol/L (ref 20–31)
Calcium: 9.2 mg/dL (ref 8.6–10.2)
Creat: 0.63 mg/dL (ref 0.50–1.10)
GLUCOSE: 72 mg/dL (ref 65–99)
Potassium: 3.1 mmol/L — ABNORMAL LOW (ref 3.5–5.3)
SODIUM: 141 mmol/L (ref 135–146)

## 2016-10-09 MED ORDER — METFORMIN HCL 500 MG PO TABS: ORAL_TABLET | ORAL | 1 refills | Status: DC

## 2016-10-09 NOTE — Patient Instructions (Signed)
  Change your evening metformin dose time to before dinner rather than before bedtime.  Don't skip a morning dose if the sugar is low (as long as it is over 70, and you are eating, it is fine.)  Please start taking an additional Vitamin D3 1000 IU every day, along with your prenatal vitamin.  Please call [facility] and get set up with a new CPAP machine so you can get back to using it regularly and feeling better!  Please call and arrange for your yearly diabetic eye exam (and make sure they send [place] a copy of the report).  Continue your current blood pressure medication and monitoring regularly at home.  Contact us if you see your blood pressure consistently being high (>135/85).

## 2016-10-12 LAB — ALDOSTERONE: Aldosterone, Serum: 8 ng/dL

## 2016-11-03 ENCOUNTER — Encounter (HOSPITAL_COMMUNITY): Payer: Self-pay | Admitting: Emergency Medicine

## 2016-11-03 ENCOUNTER — Emergency Department (HOSPITAL_COMMUNITY): Payer: 59

## 2016-11-03 ENCOUNTER — Emergency Department (HOSPITAL_COMMUNITY)
Admission: EM | Admit: 2016-11-03 | Discharge: 2016-11-03 | Disposition: A | Discharge status: Home / Self Care | Payer: 59 | Attending: Emergency Medicine | Admitting: Emergency Medicine

## 2016-11-03 DIAGNOSIS — I10 Essential (primary) hypertension: Secondary | ICD-10-CM | POA: Diagnosis not present

## 2016-11-03 DIAGNOSIS — Z91010 Allergy to peanuts: Secondary | ICD-10-CM | POA: Diagnosis not present

## 2016-11-03 DIAGNOSIS — E1129 Type 2 diabetes mellitus with other diabetic kidney complication: Secondary | ICD-10-CM | POA: Insufficient documentation

## 2016-11-03 DIAGNOSIS — R079 Chest pain, unspecified: Secondary | ICD-10-CM | POA: Diagnosis not present

## 2016-11-03 DIAGNOSIS — Z79899 Other long term (current) drug therapy: Secondary | ICD-10-CM | POA: Insufficient documentation

## 2016-11-03 DIAGNOSIS — J45909 Unspecified asthma, uncomplicated: Secondary | ICD-10-CM | POA: Insufficient documentation

## 2016-11-03 DIAGNOSIS — Z6841 Body Mass Index (BMI) 40.0 and over, adult: Secondary | ICD-10-CM | POA: Diagnosis not present

## 2016-11-03 DIAGNOSIS — R072 Precordial pain: Secondary | ICD-10-CM | POA: Diagnosis not present

## 2016-11-03 DIAGNOSIS — Z7984 Long term (current) use of oral hypoglycemic drugs: Secondary | ICD-10-CM | POA: Insufficient documentation

## 2016-11-03 DIAGNOSIS — R809 Proteinuria, unspecified: Secondary | ICD-10-CM | POA: Insufficient documentation

## 2016-11-03 DIAGNOSIS — R0602 Shortness of breath: Secondary | ICD-10-CM | POA: Diagnosis not present

## 2016-11-03 LAB — I-STAT TROPONIN, ED
TROPONIN I, POC: 0.01 ng/mL (ref 0.00–0.08)
Troponin i, poc: 0 ng/mL (ref 0.00–0.08)

## 2016-11-03 LAB — BASIC METABOLIC PANEL
Anion gap: 7 (ref 5–15)
BUN: 8 mg/dL (ref 6–20)
CALCIUM: 9.1 mg/dL (ref 8.9–10.3)
CO2: 26 mmol/L (ref 22–32)
CREATININE: 0.61 mg/dL (ref 0.44–1.00)
Chloride: 105 mmol/L (ref 101–111)
Glucose, Bld: 109 mg/dL — ABNORMAL HIGH (ref 65–99)
Potassium: 3.3 mmol/L — ABNORMAL LOW (ref 3.5–5.1)
SODIUM: 138 mmol/L (ref 135–145)

## 2016-11-03 LAB — I-STAT BETA HCG BLOOD, ED (MC, WL, AP ONLY): HCG, QUANTITATIVE: 5.5 m[IU]/mL — AB (ref <5)

## 2016-11-03 LAB — I-STAT CHEM 8, ED
BUN: 9 mg/dL (ref 6–20)
CALCIUM ION: 1.14 mmol/L — AB (ref 1.15–1.40)
Chloride: 103 mmol/L (ref 101–111)
Creatinine, Ser: 0.6 mg/dL (ref 0.44–1.00)
GLUCOSE: 105 mg/dL — AB (ref 65–99)
HCT: 37 % (ref 36.0–46.0)
HEMOGLOBIN: 12.6 g/dL (ref 12.0–15.0)
Potassium: 3.4 mmol/L — ABNORMAL LOW (ref 3.5–5.1)
Sodium: 141 mmol/L (ref 135–145)
TCO2: 27 mmol/L (ref 0–100)

## 2016-11-03 LAB — CBC
HCT: 36.7 % (ref 36.0–46.0)
Hemoglobin: 12.2 g/dL (ref 12.0–15.0)
MCH: 26.2 pg (ref 26.0–34.0)
MCHC: 33.2 g/dL (ref 30.0–36.0)
MCV: 78.8 fL (ref 78.0–100.0)
PLATELETS: 276 10*3/uL (ref 150–400)
RBC: 4.66 MIL/uL (ref 3.87–5.11)
RDW: 14.2 % (ref 11.5–15.5)
WBC: 7.7 10*3/uL (ref 4.0–10.5)

## 2016-11-03 LAB — D-DIMER, QUANTITATIVE: D-Dimer, Quant: 0.27 ug/mL-FEU (ref 0.00–0.50)

## 2016-11-03 LAB — POC URINE PREG, ED: PREG TEST UR: NEGATIVE

## 2016-11-03 MED ORDER — POTASSIUM CHLORIDE CRYS ER 20 MEQ PO TBCR
20.0000 meq | EXTENDED_RELEASE_TABLET | Freq: Once | ORAL | Status: AC
  Administered 2016-11-03: 20 meq via ORAL
  Filled 2016-11-03: qty 1

## 2016-11-03 MED ORDER — IBUPROFEN 200 MG PO TABS
600.0000 mg | ORAL_TABLET | Freq: Once | ORAL | Status: AC
  Administered 2016-11-03: 600 mg via ORAL
  Filled 2016-11-03: qty 1

## 2016-11-03 MED ORDER — SODIUM CHLORIDE 0.9 % IV BOLUS (SEPSIS)
500.0000 mL | Freq: Once | INTRAVENOUS | Status: AC
  Administered 2016-11-03: 500 mL via INTRAVENOUS
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
## 2016-11-03 NOTE — ED Triage Notes (Signed)
Pt sts mid sternal CP and SOB; pt sts started last night with some radiation to left arm

## 2016-11-03 NOTE — Discharge Instructions (Signed)
Please follow up with your primary care provider so you can receive additional testing and cardiac workup (risk stratification) as we discussed. Also please discuss with yiour PCP blood pressure management as your blood pressure was elevated today on the emergency department. You can return to the emergency department for worsening or new concerning symptoms.

## 2016-11-03 NOTE — ED Notes (Signed)
Patient to xray.

## 2016-11-03 NOTE — ED Provider Notes (Signed)
The patient is a 36 year old female, she is very obese, she has a history of having chest pain back in January during which time she was evaluated in the emergency department, it was very similar to what she is expressing today that started approximately 10 hours prior to my exam. It is a pain in the lower sternal area which is sharp accompanied by a dull feeling in the upper chest. It is worse with breathing, worse with movement of her body, as I watch her at just in the bed she appears to be uncomfortable and states that makes the pain worse. She has some reversible tenderness over the chest wall. Her lungs and heart are normal, there is no murmurs, clear lung sounds, she is otherwise well-appearing. Troponin normal, EKG unremarkable except for some right axis deviation. CT scan will only be needed if the d-dimer is elevated.  Medical screening examination/treatment/procedure(s) were conducted as a shared visit with non-physician practitioner(s) and myself.  I personally evaluated the patient during the encounter.  Clinical Impression:   Final diagnoses:  Chest pain at rest         Noemi Chapel, MD 11/05/16 [phone]

## 2016-12-12 ENCOUNTER — Telehealth: Payer: Self-pay

## 2016-12-12 MED ORDER — AMLODIPINE BESYLATE 10 MG PO TABS: 5.0000 mg | ORAL_TABLET | Freq: Every day | ORAL | 1 refills | Status: DC

## 2016-12-12 MED FILL — AMLODIPINE BESYLATE 10 MG T: 10 | 60 days supply | Qty: 30 | Fill #0

## 2016-12-12 MED FILL — metFORMIN HCL 500 MG TABS: 500 | 90 days supply | Qty: 180 | Fill #0

## 2016-12-12 NOTE — Telephone Encounter (Signed)
Pt needs refill of amlodipine called to [facility] pharmacy. Victoria [name]

## 2016-12-12 NOTE — Telephone Encounter (Signed)
Refilled med

## 2017-02-09 ENCOUNTER — Emergency Department (HOSPITAL_COMMUNITY): Payer: PRIVATE HEALTH INSURANCE

## 2017-02-09 ENCOUNTER — Emergency Department (HOSPITAL_COMMUNITY)
Admission: EM | Admit: 2017-02-09 | Discharge: 2017-02-09 | Disposition: A | Discharge status: Home / Self Care | Payer: PRIVATE HEALTH INSURANCE | Attending: Emergency Medicine | Admitting: Emergency Medicine

## 2017-02-09 ENCOUNTER — Encounter (HOSPITAL_COMMUNITY): Payer: Self-pay | Admitting: Emergency Medicine

## 2017-02-09 DIAGNOSIS — Y92238 Other place in hospital as the place of occurrence of the external cause: Secondary | ICD-10-CM | POA: Insufficient documentation

## 2017-02-09 DIAGNOSIS — I10 Essential (primary) hypertension: Secondary | ICD-10-CM | POA: Diagnosis not present

## 2017-02-09 DIAGNOSIS — W010XXA Fall on same level from slipping, tripping and stumbling without subsequent striking against object, initial encounter: Secondary | ICD-10-CM | POA: Diagnosis not present

## 2017-02-09 DIAGNOSIS — Y990 Civilian activity done for income or pay: Secondary | ICD-10-CM | POA: Diagnosis not present

## 2017-02-09 DIAGNOSIS — M25552 Pain in left hip: Secondary | ICD-10-CM | POA: Diagnosis not present

## 2017-02-09 DIAGNOSIS — Y9389 Activity, other specified: Secondary | ICD-10-CM | POA: Diagnosis not present

## 2017-02-09 DIAGNOSIS — Z7984 Long term (current) use of oral hypoglycemic drugs: Secondary | ICD-10-CM | POA: Insufficient documentation

## 2017-02-09 DIAGNOSIS — J45909 Unspecified asthma, uncomplicated: Secondary | ICD-10-CM | POA: Insufficient documentation

## 2017-02-09 DIAGNOSIS — Z91010 Allergy to peanuts: Secondary | ICD-10-CM | POA: Diagnosis not present

## 2017-02-09 DIAGNOSIS — M25562 Pain in left knee: Secondary | ICD-10-CM | POA: Insufficient documentation

## 2017-02-09 DIAGNOSIS — E119 Type 2 diabetes mellitus without complications: Secondary | ICD-10-CM | POA: Insufficient documentation

## 2017-02-09 DIAGNOSIS — Z79899 Other long term (current) drug therapy: Secondary | ICD-10-CM | POA: Insufficient documentation

## 2017-02-09 DIAGNOSIS — M25512 Pain in left shoulder: Secondary | ICD-10-CM | POA: Insufficient documentation

## 2017-02-09 DIAGNOSIS — W19XXXA Unspecified fall, initial encounter: Secondary | ICD-10-CM

## 2017-02-09 LAB — POC URINE PREG, ED: Preg Test, Ur: NEGATIVE

## 2017-02-09 MED ORDER — NAPROXEN 375 MG PO TABS: 375.0000 mg | ORAL_TABLET | Freq: Two times a day (BID) | ORAL | 0 refills | Status: DC

## 2017-02-09 MED ORDER — ACETAMINOPHEN 500 MG PO TABS: 500.0000 mg | ORAL_TABLET | Freq: Four times a day (QID) | ORAL | 0 refills | Status: DC | PRN

## 2017-02-09 MED ORDER — CYCLOBENZAPRINE HCL 10 MG PO TABS: 10.0000 mg | ORAL_TABLET | Freq: Two times a day (BID) | ORAL | 0 refills | Status: DC | PRN

## 2017-02-09 NOTE — Discharge Instructions (Signed)
Medications: Flexeril, naprosyn, Tylenol  Treatment: Take Flexeril 2 times daily as needed for muscle spasms. Do not drive or operate machinery when taking this medication. Take naprosyn twice daily as needed for your pain. You can also take Tylenol every 6 hours as needed. For the first 2-3 days, use ice 3-4 times daily alternating 20 minutes on, 20 minutes off. After the first 2-3 days, use moist heat in the same manner.   Follow-up: Please follow-up with your primary care provider this week for recheck. Please return to emergency department if you develop any new or worsening symptoms.
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
## 2017-02-09 NOTE — ED Triage Notes (Signed)
Patient reports fall at work today. C/o left shoulder, hip, and knee pain. Ambulatory. Denies head injury and LOC.

## 2017-03-09 ENCOUNTER — Encounter (HOSPITAL_COMMUNITY): Payer: Self-pay | Admitting: Family Medicine

## 2017-03-09 ENCOUNTER — Ambulatory Visit (HOSPITAL_COMMUNITY)
Admission: EM | Admit: 2017-03-09 | Discharge: 2017-03-09 | Disposition: A | Discharge status: Home / Self Care | Payer: 59 | Attending: Family Medicine | Admitting: Family Medicine

## 2017-03-09 DIAGNOSIS — J4521 Mild intermittent asthma with (acute) exacerbation: Secondary | ICD-10-CM | POA: Diagnosis not present

## 2017-03-09 DIAGNOSIS — J4531 Mild persistent asthma with (acute) exacerbation: Secondary | ICD-10-CM

## 2017-03-09 DIAGNOSIS — R809 Proteinuria, unspecified: Secondary | ICD-10-CM

## 2017-03-09 DIAGNOSIS — E1129 Type 2 diabetes mellitus with other diabetic kidney complication: Secondary | ICD-10-CM | POA: Diagnosis not present

## 2017-03-09 DIAGNOSIS — I10 Essential (primary) hypertension: Secondary | ICD-10-CM

## 2017-03-09 MED ORDER — CLONIDINE HCL 0.1 MG PO TABS
ORAL_TABLET | ORAL | Status: AC
  Filled 2017-03-09: qty 1

## 2017-03-09 MED ORDER — ALBUTEROL SULFATE HFA 108 (90 BASE) MCG/ACT IN AERS: 2.0000 | INHALATION_SPRAY | Freq: Four times a day (QID) | RESPIRATORY_TRACT | 1 refills | Status: DC | PRN

## 2017-03-09 MED ORDER — CLONIDINE HCL 0.1 MG PO TABS
0.1000 mg | ORAL_TABLET | Freq: Once | ORAL | Status: AC
  Administered 2017-03-09: 0.1 mg via ORAL

## 2017-03-09 MED ORDER — AEROCHAMBER PLUS FLO-VU MEDIUM MISC
1.0000 | Freq: Once | Status: AC
  Administered 2017-03-09: 1

## 2017-03-09 MED ORDER — ALBUTEROL SULFATE HFA 108 (90 BASE) MCG/ACT IN AERS
2.0000 | INHALATION_SPRAY | Freq: Four times a day (QID) | RESPIRATORY_TRACT | Status: DC
  Administered 2017-03-09: 2 via RESPIRATORY_TRACT

## 2017-03-09 MED ORDER — METFORMIN HCL 500 MG PO TABS: 500.0000 mg | ORAL_TABLET | Freq: Two times a day (BID) | ORAL | 3 refills | Status: DC

## 2017-03-09 MED ORDER — IPRATROPIUM-ALBUTEROL 0.5-2.5 (3) MG/3ML IN SOLN
RESPIRATORY_TRACT | Status: AC
  Filled 2017-03-09: qty 3

## 2017-03-09 MED ORDER — IPRATROPIUM-ALBUTEROL 0.5-2.5 (3) MG/3ML IN SOLN
3.0000 mL | Freq: Once | RESPIRATORY_TRACT | Status: AC
Start: 2017-03-09 — End: 2017-03-09
  Administered 2017-03-09: 3 mL via RESPIRATORY_TRACT

## 2017-03-09 MED ORDER — AMLODIPINE BESYLATE 10 MG PO TABS
5.0000 mg | ORAL_TABLET | Freq: Every day | ORAL | 5 refills | Status: DC
Start: 2017-03-09 — End: 2017-06-02

## 2017-03-09 MED ORDER — ALBUTEROL SULFATE HFA 108 (90 BASE) MCG/ACT IN AERS
INHALATION_SPRAY | RESPIRATORY_TRACT | Status: AC
  Filled 2017-03-09: qty 6.7
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
## 2017-03-09 NOTE — ED Triage Notes (Signed)
Pt here for asthma flare up onset 1800 associated w/SOB, CP, ST  Denies fevers  BP today = 168/128 ... Has not had BP meds x1 week  A&O x4... NAD... Ambulatory

## 2017-03-10 ENCOUNTER — Encounter: Payer: Self-pay | Admitting: Family Medicine

## 2017-03-10 ENCOUNTER — Ambulatory Visit (INDEPENDENT_AMBULATORY_CARE_PROVIDER_SITE_OTHER): Payer: 59 | Admitting: Family Medicine

## 2017-03-10 VITALS — BP 170/110 | HR 80 | Ht 64.0 in | Wt 297.8 lb

## 2017-03-10 DIAGNOSIS — R0602 Shortness of breath: Secondary | ICD-10-CM

## 2017-03-10 DIAGNOSIS — I10 Essential (primary) hypertension: Secondary | ICD-10-CM

## 2017-03-10 DIAGNOSIS — R519 Headache, unspecified: Secondary | ICD-10-CM

## 2017-03-10 DIAGNOSIS — Z23 Encounter for immunization: Secondary | ICD-10-CM | POA: Diagnosis not present

## 2017-03-10 DIAGNOSIS — R079 Chest pain, unspecified: Secondary | ICD-10-CM | POA: Diagnosis not present

## 2017-03-10 DIAGNOSIS — R51 Headache: Secondary | ICD-10-CM

## 2017-03-10 DIAGNOSIS — J4521 Mild intermittent asthma with (acute) exacerbation: Secondary | ICD-10-CM | POA: Diagnosis not present

## 2017-03-10 DIAGNOSIS — E876 Hypokalemia: Secondary | ICD-10-CM

## 2017-03-10 DIAGNOSIS — G4733 Obstructive sleep apnea (adult) (pediatric): Secondary | ICD-10-CM | POA: Diagnosis not present

## 2017-03-10 LAB — CBC WITH DIFFERENTIAL/PLATELET
BASOS ABS: 20 {cells}/uL (ref 0–200)
Basophils Relative: 0.3 %
Eosinophils Absolute: 170 cells/uL (ref 15–500)
Eosinophils Relative: 2.5 %
HCT: 33 % — ABNORMAL LOW (ref 35.0–45.0)
HEMOGLOBIN: 11.3 g/dL — AB (ref 11.7–15.5)
Lymphs Abs: 2448 cells/uL (ref 850–3900)
MCH: 26.4 pg — ABNORMAL LOW (ref 27.0–33.0)
MCHC: 34.2 g/dL (ref 32.0–36.0)
MCV: 77.1 fL — ABNORMAL LOW (ref 80.0–100.0)
MPV: 10 fL (ref 7.5–12.5)
Monocytes Relative: 6.4 %
NEUTROS ABS: 3726 {cells}/uL (ref 1500–7800)
Neutrophils Relative %: 54.8 %
PLATELETS: 321 10*3/uL (ref 140–400)
RBC: 4.28 10*6/uL (ref 3.80–5.10)
RDW: 14.5 % (ref 11.0–15.0)
Total Lymphocyte: 36 %
WBC mixed population: 435 cells/uL (ref 200–950)
WBC: 6.8 10*3/uL (ref 3.8–10.8)

## 2017-03-10 LAB — BASIC METABOLIC PANEL
BUN: 11 mg/dL (ref 7–25)
CALCIUM: 8.7 mg/dL (ref 8.6–10.2)
CO2: 26 mmol/L (ref 20–32)
Chloride: 105 mmol/L (ref 98–110)
Creat: 0.6 mg/dL (ref 0.50–1.10)
GLUCOSE: 146 mg/dL — AB (ref 65–99)
Potassium: 3.5 mmol/L (ref 3.5–5.3)
SODIUM: 140 mmol/L (ref 135–146)

## 2017-03-10 LAB — D-DIMER, QUANTITATIVE (NOT AT ARMC)

## 2017-03-10 LAB — TROPONIN I: TROPONIN I: 0.01 ng/mL (ref <=0.0)
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
## 2017-03-17 ENCOUNTER — Telehealth: Payer: Self-pay | Admitting: *Deleted

## 2017-03-17 NOTE — Telephone Encounter (Signed)
Spoke with patient and went over her lab results, she is feeling better. HA is gone and her bp reading today was 134/92. Just and FYI.

## 2017-03-31 DIAGNOSIS — G4733 Obstructive sleep apnea (adult) (pediatric): Secondary | ICD-10-CM | POA: Diagnosis not present

## 2017-03-31 DIAGNOSIS — J45909 Unspecified asthma, uncomplicated: Secondary | ICD-10-CM | POA: Diagnosis not present

## 2017-04-09 NOTE — Progress Notes (Deleted)
   Patient presents to follow up on her hypertension.  She was last seen a month ago, when she was having shortness of breath and elevated blood pressures. She had missed taking her amlodipine for just a couple of days.  She had been only taking 1/2 tablet of amlodipine, only recently increased up to the full tablet due to higher BP's at home.  BP's at home have been 128/79, highest was 140/85, mostly 130's/low 80's.   Still nursing??  If not, ARB/ACEI?? UPDATE She previously had blood pressure well controlled with amlodipine 5mg  and HCTZ (prior to last pregnancy), but recalls HCTZ causing issues with breast-feeding in the past, so this regimen wasn't restarted, just the amlodipine.   She continues to breastfeed. She denies headaches, dizziness, chest pain, shortness of breath, edema.  H/o hypokalemia.  Potassium was normal on recent check   Chemistry      Component Value Date/Time   NA 140 03/10/2017 1025   K 3.5 03/10/2017 1025   CL 105 03/10/2017 1025   CO2 26 03/10/2017 1025   BUN 11 03/10/2017 1025   CREATININE 0.60 03/10/2017 1025      Component Value Date/Time   CALCIUM 8.7 03/10/2017 1025   ALKPHOS 77 05/13/2016 1538   AST 15 05/13/2016 1538   ALT 14 05/13/2016 1538   BILITOT 0.3 05/13/2016 1538        Diabetes: She started Metformin in May, 2016. She has some hypoglycemic symptoms whenever her sugar is 80 or lower, which has been frequently lately, in the mornings. Usually morning sugars are ranging 90-110.  They are a little higher in the evenings, up to 150's.  Last diabetic eye exam was over a year ago (her doctor retired, needs to find a new one). No numbness, tingling, skin lesions, vision changes or other concerns. She was noted to have microalbuminuria in 04/2016.  Not a candidate for ACEI/ARB due to nursing. Lab Results  Component Value Date   HGBA1C 6.1 10/09/2016    H/o Vitamin D deficiency: she has had prescription replacement in the past. Last check was  28 in 04/2016 She currently is still just taking prenatal vitamin daily, not an additional Vitamin D.   OSA: Her CPAP machine isn't working.  She hasn't called [facility] yet.  Hasn't been working for a couple of months.  She does report feeling more fatigued, not getting headaches like in the past.  +daytime somnolence--not falling asleep though. She previously was very compliant in using her CPAP nightly.     Urine microalb A1c Lipids Vit D

## 2017-04-10 ENCOUNTER — Ambulatory Visit: Payer: 59 | Admitting: Family Medicine

## 2017-04-10 ENCOUNTER — Telehealth: Payer: Self-pay | Admitting: *Deleted

## 2017-04-10 NOTE — Telephone Encounter (Signed)

## 2017-04-11 MED FILL — AMLODIPINE BESYLATE 10 MG T: 10 | 60 days supply | Qty: 30 | Fill #1

## 2017-04-11 NOTE — Telephone Encounter (Signed)
Needs no show letter.  And needs med check r/s'd

## 2017-04-25 ENCOUNTER — Encounter: Payer: Self-pay | Admitting: Family Medicine

## 2017-04-28 ENCOUNTER — Encounter (HOSPITAL_COMMUNITY): Payer: Self-pay | Admitting: Family Medicine

## 2017-04-28 ENCOUNTER — Other Ambulatory Visit: Payer: Self-pay

## 2017-04-28 ENCOUNTER — Ambulatory Visit (HOSPITAL_COMMUNITY)
Admission: EM | Admit: 2017-04-28 | Discharge: 2017-04-28 | Disposition: A | Discharge status: Home / Self Care | Payer: 59 | Attending: Family Medicine | Admitting: Family Medicine

## 2017-04-28 ENCOUNTER — Telehealth: Payer: 59 | Admitting: Nurse Practitioner

## 2017-04-28 DIAGNOSIS — R739 Hyperglycemia, unspecified: Secondary | ICD-10-CM

## 2017-04-28 DIAGNOSIS — R5383 Other fatigue: Secondary | ICD-10-CM | POA: Diagnosis not present

## 2017-04-28 DIAGNOSIS — R51 Headache: Secondary | ICD-10-CM | POA: Diagnosis not present

## 2017-04-28 DIAGNOSIS — N946 Dysmenorrhea, unspecified: Secondary | ICD-10-CM | POA: Diagnosis not present

## 2017-04-28 LAB — GLUCOSE, CAPILLARY: GLUCOSE-CAPILLARY: 138 mg/dL — AB (ref 65–99)

## 2017-04-28 MED ORDER — KETOROLAC TROMETHAMINE 60 MG/2ML IM SOLN
INTRAMUSCULAR | Status: AC
  Filled 2017-04-28: qty 2

## 2017-04-28 MED ORDER — KETOROLAC TROMETHAMINE 60 MG/2ML IM SOLN
60.0000 mg | Freq: Once | INTRAMUSCULAR | Status: AC
  Administered 2017-04-28: 60 mg via INTRAMUSCULAR

## 2017-04-28 NOTE — ED Triage Notes (Signed)
Started yesterday due to high blood sugar, headaches, fatigue, elevated blood pressure, started menstrual cycle yesterday and per pt it's draining her energy.

## 2017-04-28 NOTE — Progress Notes (Signed)
Based on what you shared with me it looks like you have a serious condition that should be evaluated in a face to face office visit.  NOTE: Even if you have entered your credit card information for this eVisit, you will not be charged.   If you are having a true medical emergency please call 911.  If you need an urgent face to face visit, [facility] has four urgent care centers for your convenience.  If you need care fast and have a high deductible or no insurance consider:   [email or web address]  [phone]  [address], [address] [place], [place] [address] 8 am to 8 pm Monday-Friday 10 am to 4 pm Saturday-Sunday   The following sites will take your  insurance:    . [facility]  [phone] Get Driving Directions Find a Provider at this Location  [address] [place], [place] [address] . 10 am to 8 pm Monday-Friday . 12 pm to 8 pm Saturday-Sunday   . [facility]  [phone] Get Driving Directions Find a Provider at this Location  [address], [address] [place], [place] [address] . 8 am to 8 pm Monday-Friday . 9 am to 6 pm Saturday . 11 am to 6 pm Sunday   .  [facility] [place]  [phone] Get Driving Directions  [address] [place],  [address] . 8 am to 8 pm Monday-Friday . 8 am to 4 pm Saturday-Sunday   Your e-visit answers were reviewed by a board certified advanced clinical practitioner to complete your personal care plan.  Thank you for using e-Visits.  

## 2017-04-29 NOTE — Progress Notes (Signed)
Chief Complaint  Patient presents with  . Dizziness    started Monday while at work, thought she was having low blood sugars but they were actually high. Feeling extremely fatigued. HA since Monday, toradol that was given Monday helped some.    Patient presents with complaint of feeling dizzy, heavy menstrual bleeding and headache x 2 days. Blood sugar was noted to be 138. BP was 152/94 at [facility] yesterday. She had a headache. She was treated with toradol, which helped some with the headache. BP at work was 164/124. No notes are available in epic from [facility] at time of her office visit.  Headache is frontal and posterior, currently 5/10.  Menses monthly, usually heavy.  She reports it started out heavier than usual, but is now actually a little lighter.  Usually is heavy all 5 days of cycle.  It was heavier at first, needing tampons and pads, but has slowed down, now manageable.  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 She denies pregnancy. She is accompanied by her husband and two young sons (didn't want to speak about this in front of them, said they "took precautions").  She denies h/o  headaches or fatigue with her cycles like this.  She missed her med check scheduled in November, and never rescheduled one. (missed appt 11/15, letter didn't go out until 11/30).  She reports she has been compliant with taking Amlodipine 36m daily. Denies any missed pills. She has been monitoring her BP at home, and they had improvest.  She reports the highest she saw since her last visit was 135/90 until Monday.  She is no longer nursing. She previously had blood pressure well controlled with amlodipine 5100mand HCTZ (prior to last pregnancy), but recalls HCTZ causing issues with breast-feeding in the past, so this regimen wasn't restarted, just the amlodipine. She has h/o microalbuminuria, but wasn't put on ACEI or ARB due to pregnancy/nursing   Diabetes: She started Metformin in May,  2016. She hasn't been checking her sugars.  Has strips and monitor at home. Drinking more sodas for the past few months.  Due to "stress"--makes her feel better, gives her energy when at work.   She used to stress-eat a lot more, when she was in school. Not currently in school.    She admits that she only takes Metformin once daily (and was also only taking it once daily when her A1c was normal, on last check). Taking it twice daily gives her diarrhea. She did take it twice daily after seeing high value this week, had loose stool.  Lab Results  Component Value Date   HGBA1C 6.1 10/09/2016   OSA:She is now back on CPAP, doing a whole lot better. She is compliant inusing her CPAP nightly. Feels like she sleeps well at night.  But feeling very tired for the last 2 days, unsure why.  PMH, PSH, SH reviewed  Outpatient Encounter Medications as of 04/30/2017  Medication Sig  . amLODipine (NORVASC) 10 MG tablet Take 0.5 tablets (5 mg total) by mouth at bedtime. (Patient taking differently: Take 10 mg by mouth at bedtime. )  . metFORMIN (GLUCOPHAGE) 500 MG tablet Take 1 tablet (500 mg total) by mouth 2 (two) times daily with a meal.  . acetaminophen (TYLENOL) 500 MG tablet Take 1 tablet (500 mg total) by mouth every 6 (six) hours as needed. (Patient not taking: Reported on 03/10/2017)  . albuterol (PROVENTIL HFA;VENTOLIN HFA) 108 (90 Base) MCG/ACT inhaler Inhale 2 puffs  into the lungs every 6 (six) hours as needed for wheezing or shortness of breath. (Patient not taking: Reported on 04/30/2017)  . hydrochlorothiazide (HYDRODIURIL) 25 MG tablet Take 1 tablet (25 mg total) by mouth daily.   No facility-administered encounter medications on file as of 04/30/2017.    (only taking metformin once daily, per HPI)  Allergies  Allergen Reactions  . Dilaudid [Hydromorphone Hcl] Hives  . Morphine And Related Hives  . Peanut-Containing Drug Products Hives  . Strawberry Extract Swelling    Swelling is of  the eye.   ROS:  No fever, chills.  +headache. No URI symptoms.  +fatigue. Slight nausea with standing, also feels dizzy with standing, feels better laying down. No vomiting. No diarrhea (some yesterday after taking metformin, took twice yesterday). No bleeding other than menstrual. No rash See HPI.  PHYSICAL EXAM:  BP (!) 180/118 (BP Location: Left Arm, Cuff Size: Large)   Pulse 84   Ht 5' 4"  (1.626 m)   Wt (!) 303 lb 3.2 oz (137.5 kg)   LMP 04/27/2017 (Exact Date)   BMI 52.04 kg/m   Wt Readings from Last 3 Encounters:  04/30/17 (!) 303 lb 3.2 oz (137.5 kg)  03/10/17 297 lb 12.8 oz (135.1 kg)  02/09/17 (!) 301 lb (136.5 kg)   174/108 on repeat by MD Pain scale 5/10 (headache) Accompanied by her husband and 2 young sons--the youngest was crying loudly in the room for much of the visit. Appears tired, somewhat weak when standing, appears fine with sitting, laying down.  HEENT: conjunctiva and sclera are clear, EOMI, OP clear Neck: no lymphadenopathy, mass, bruit Heart: regular rate and rhythm Lungs: clear bilaterally Back: no CVA tenderness Abdomen: soft, obese, nontender, no mass Extremities: no edema Neuro: alert and oriented, cranial nerves intact. Slow, cautious gait. Skin: normal turgor no rash Psych: somewhat anxious, not depressed. Normal eye contact, speech, hygiene and grooming.   Lab Results  Component Value Date   HGBA1C 8.1 04/30/2017   Hg 11.1   ASSESSMENT/PLAN:  Type 2 diabetes mellitus without complication, without long-term current use of insulin (HCC) - uncontrolled--hasn't been monitoring sugars, not exercising, drinking more soda, not tolerating higher dose of metformin. Agrees to injectable - Plan: HgB A1c, Comprehensive metabolic panel  Essential hypertension, benign - acutely elevated in the last 2 days, improved/borderline at home. no longer nursing. restart HCTZ (cautiously--may be slightly dehydrated today) - Plan: Comprehensive metabolic  panel, hydrochlorothiazide (HYDRODIURIL) 25 MG tablet  Menorrhagia with regular cycle - Hgb ok. not orthostatic. Encouraged fluids/hydration. fu with GYN--encouraged contraception (then start ARB/ACEI) - Plan: Hemoglobin, CBC with Differential/Platelet, Ferritin  Abnormal vaginal bleeding - Plan: POCT urine pregnancy  Recommend diabetes program through [facility] Kelli Churn).  Discussed meds such as Farxiga/Jardiance vs injectable (Trulicity, Bydureon B-cise). Discussed that I believe if she goes through the program, she can get medication for no copay (I believe it is trulicity after 1/1, not sure about now, but we could start with sample rather than having to change meds).  We can instruct on proper use here if given sample, vs have Kelli Churn do teaching. She doesn't tolerate higher dose of metformin due to GI side effects. Reviewed diet--need to come up with better coping skills for stress, other caffeine sources (without sugar) to help at work. Has significant stress related to her job (fetal demise, etc)--encouraged Archivist.  HTN--start HCTZ since no longer nursing. Continue amlodipine 68m.  To start HCTZ cautiously over next few days, work on  hydrating. Really needs to be on ARB or ACEI due to diabetes and microalbuminuria.  Until this visit, she had been nursing.  She doesn't report being on a "reliable" form of contraception. We discussed that regular condom use (with back-up Plan B if condom breaks, falls off) could be utilized until she discusses other options with her GYN, but she declines starting these meds at this time. Will need in future, when on contraception.  Will need repeat microalbumin as well (not now, recheck when BP and sugars are improving). Unclear why she is having such high BP's now.  Doubt pheo, given lack of tachycardia, lasting for longer. She is off work for the next two days--encouraged to hydrate well and get plenty of rest.  To monitor  BP and sugars.   CBC, c-met, iron, ferritin Not fasting today, so lipids not checked  Over 45 min visit today, more than 1/2 spent counseling.   F/u in 2 weeks on BP and sugars. Likely can start trulicity (sample) at visit.    Please look into the employee assistance program offered through [facility] for counseling. Work on Systems analyst (deep breathing, relaxation, exercise). Cut out regular sodas.    Please look into the programs available through [facility] for diabetes.  Since you don't tolerate higher doses of metformin, we need to adjust your medications to get your sugars under control. I highly recommend the once weekly injectable medications (ie Bydureon B-cise, Trulicity). The [facility] program definitely offers benefit, and has no copay for certain of these medications.  The nurse there (I think is Kelli Churn) can also do the teaching to show you how to use it. Contact me for the prescription once you have met with them (and know which is covered, I think it is Trulicity after 1/1, but can't recall).  Drink lots and lots of water.  Avoid caffeinated beverages (if you need caffeine use tea or coffee, without sugar). You are not significantly anemic--your dizziness is more likely from the heavy bleeding, so push oral fluids.  Your blood pressure is very high--multifactorial. You are feeling bad, there is some stress, fatigue. I think we need to add a blood pressure medication to get it down.  I'm hesitant to start HCTZ which worked in the past, when you may be dehydrated (from bleeding) and feeling dizzy.  You should be taking lisinopril or losartan due to having diabetes and microalbumin in the urine, but you need to be using effective contraception before this can be started. If you want to start now, you need to use condoms regularly, with Plan B as back-up, until you discuss other options with Dr. Willis Modena. You elected to hold off, and wait to see Dr. Willis Modena to discuss  these options.  Start the HCTZ after pushing fluids very hard--once you stop bleeding and aren't as dizzy /nauseated with standing, it is okay to start this.  Start 19m once daily.  If your blood pressures drop below 1956-387systolic, you can cut the HCTZ in half.  Return here in 2 weeks to follow up on blood pressure, sooner if worsening.

## 2017-04-30 ENCOUNTER — Ambulatory Visit (INDEPENDENT_AMBULATORY_CARE_PROVIDER_SITE_OTHER): Payer: 59 | Admitting: Family Medicine

## 2017-04-30 ENCOUNTER — Encounter: Payer: Self-pay | Admitting: Family Medicine

## 2017-04-30 VITALS — BP 180/118 | HR 84 | Ht 64.0 in | Wt 303.2 lb

## 2017-04-30 DIAGNOSIS — I10 Essential (primary) hypertension: Secondary | ICD-10-CM

## 2017-04-30 DIAGNOSIS — N939 Abnormal uterine and vaginal bleeding, unspecified: Secondary | ICD-10-CM | POA: Diagnosis not present

## 2017-04-30 DIAGNOSIS — N920 Excessive and frequent menstruation with regular cycle: Secondary | ICD-10-CM

## 2017-04-30 DIAGNOSIS — E119 Type 2 diabetes mellitus without complications: Secondary | ICD-10-CM | POA: Diagnosis not present

## 2017-04-30 LAB — POCT GLYCOSYLATED HEMOGLOBIN (HGB A1C): Hemoglobin A1C: 8.1

## 2017-04-30 LAB — POCT URINE PREGNANCY: Preg Test, Ur: NEGATIVE

## 2017-04-30 LAB — POCT HEMOGLOBIN: Hemoglobin: 11.1 g/dL — AB (ref 12.2–16.2)

## 2017-04-30 MED ORDER — HYDROCHLOROTHIAZIDE 25 MG PO TABS: 25.0000 mg | ORAL_TABLET | Freq: Every day | ORAL | 1 refills | Status: DC
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
## 2017-04-30 NOTE — Patient Instructions (Addendum)
  Please look into the employee assistance program offered through [facility] for counseling. Work on Systems analyst (deep breathing, relaxation, exercise). Cut out regular sodas.    Please look into the programs available through [facility] for diabetes.  Since you don't tolerate higher doses of metformin, we need to adjust your medications to get your sugars under control. I highly recommend the once weekly injectable medications (ie Bydureon B-cise, Trulicity). The [facility] program definitely offers benefit, and has no copay for certain of these medications.  The nurse there (I think is Kelli Churn) can also do the teaching to show you how to use it. Contact me for the prescription once you have met with them (and know which is covered, I think it is Trulicity after 1/1, but can't recall).  Drink lots and lots of water.  Avoid caffeinated beverages (if you need caffeine use tea or coffee, without sugar). You are not significantly anemic--your dizziness is more likely from the heavy bleeding, so push oral fluids.  Your blood pressure is very high--multifactorial. You are feeling bad, there is some stress, fatigue, pain. I think we need to add a blood pressure medication to get it down.  I'm hesitant to start HCTZ which worked in the past, when you may be dehydrated (from bleeding) and feeling dizzy.  You should be taking lisinopril or losartan due to having diabetes and microalbumin in the urine, but you need to be using effective contraception before this can be started. If you want to start now, you need to use condoms regularly, with Plan B as back-up, until you discuss other options with Dr. Willis Modena. You elected to hold off, and wait to see Dr. Willis Modena to discuss these options.  Start the HCTZ after pushing fluids very hard--once you stop bleeding and aren't as dizzy /nauseated with standing, it is okay to start this.  Start '25mg'$  once daily.  If your blood pressures drop below 527-782  systolic, you can cut the HCTZ in half.  Return here in 2 weeks to follow up on blood pressure, sooner if feeling worse. Monitor your sugars and BP at home, and bring list to your follow-up appointment.

## 2017-05-01 LAB — CBC WITH DIFFERENTIAL/PLATELET
BASOS PCT: 0.4 %
Basophils Absolute: 28 cells/uL (ref 0–200)
EOS ABS: 128 {cells}/uL (ref 15–500)
Eosinophils Relative: 1.8 %
HEMATOCRIT: 33.2 % — AB (ref 35.0–45.0)
HEMOGLOBIN: 11.1 g/dL — AB (ref 11.7–15.5)
LYMPHS ABS: 2414 {cells}/uL (ref 850–3900)
MCH: 25.9 pg — AB (ref 27.0–33.0)
MCHC: 33.4 g/dL (ref 32.0–36.0)
MCV: 77.4 fL — ABNORMAL LOW (ref 80.0–100.0)
MONOS PCT: 6.4 %
MPV: 10.4 fL (ref 7.5–12.5)
NEUTROS ABS: 4075 {cells}/uL (ref 1500–7800)
Neutrophils Relative %: 57.4 %
Platelets: 265 10*3/uL (ref 140–400)
RBC: 4.29 10*6/uL (ref 3.80–5.10)
RDW: 14.4 % (ref 11.0–15.0)
Total Lymphocyte: 34 %
WBC: 7.1 10*3/uL (ref 3.8–10.8)
WBCMIX: 454 {cells}/uL (ref 200–950)

## 2017-05-01 LAB — COMPREHENSIVE METABOLIC PANEL
AG RATIO: 1.3 (calc) (ref 1.0–2.5)
ALT: 14 U/L (ref 6–29)
AST: 16 U/L (ref 10–30)
Albumin: 3.7 g/dL (ref 3.6–5.1)
Alkaline phosphatase (APISO): 73 U/L (ref 33–115)
BILIRUBIN TOTAL: 0.3 mg/dL (ref 0.2–1.2)
BUN: 9 mg/dL (ref 7–25)
CALCIUM: 9.4 mg/dL (ref 8.6–10.2)
CHLORIDE: 104 mmol/L (ref 98–110)
CO2: 28 mmol/L (ref 20–32)
Creat: 0.57 mg/dL (ref 0.50–1.10)
GLOBULIN: 2.9 g/dL (ref 1.9–3.7)
GLUCOSE: 131 mg/dL — AB (ref 65–99)
Potassium: 3.7 mmol/L (ref 3.5–5.3)
SODIUM: 139 mmol/L (ref 135–146)
Total Protein: 6.6 g/dL (ref 6.1–8.1)

## 2017-05-01 LAB — FERRITIN: FERRITIN: 29 ng/mL (ref 10–154)

## 2017-05-07 NOTE — Telephone Encounter (Signed)
No show letter sent.

## 2017-05-22 MED FILL — HYDROCHLOROTHIAZIDE 25 MG T: 25 | 30 days supply | Qty: 30 | Fill #0

## 2017-05-22 MED FILL — FREESTYLE LITE TEST STRIP: 50 days supply | Qty: 100 | Fill #1

## 2017-05-22 MED FILL — metFORMIN HCL 500 MG TABS: 500 | 90 days supply | Qty: 180 | Fill #1

## 2017-05-27 NOTE — Progress Notes (Deleted)
Patient presents for follow-up on her hypertension and diabetes.  Hypertension:  HCTZ 46m was added to her 121mamlodipine after her last visit. BP's at home are running Denies muscle cramps or side effects.   Denies chest pain, palpitations. Headaches???  Diabetes:  Her diabetes was not well controlled, per A1c done at her last visit.  She admitted to drinking a lot of regular soda and poor diet, related to stressors at work. Lab Results  Component Value Date   HGBA1C 8.1 04/30/2017  She only takes Metformin once daily (prescribed for BID), admits that taking it twice daily gave her diarrhea. We had encouraged her to enroll in [employer] through [place] We had discussed meds such as Farxiga/Jardiance vs injectable (Trulicity, Bydureon B-cise). She was interested in the injectable (believe it is Trulicity through [employer] [facility] without copay--she was to look into). Presents to be instructed on how to administer injectable medication.    PHYSICAL EXAM:  Wt Readings from Last 3 Encounters:  04/30/17 (!) 303 lb 3.2 oz (137.5 kg)  03/10/17 297 lb 12.8 oz (135.1 kg)  02/09/17 (!) 301 lb (136.5 kg)      ASSESSMENT/PLAN:   b-met Lipids will be needed at some point, when fasting.   Really needs to be on ARB or ACEI due to diabetes and microalbuminuria.  Until this visit, she had been nursing.  She doesn't report being on a "reliable" form of contraception. We discussed that regular condom use (with back-up Plan B if condom breaks, falls off) could be utilized until she discusses other options with her GYN, but she declines starting these meds at this time. Will need in future, when on contraception.  Will need repeat microalbumin as well (not now, recheck when BP and sugars are improving).

## 2017-05-28 ENCOUNTER — Ambulatory Visit: Payer: Self-pay | Admitting: Family Medicine

## 2017-06-01 NOTE — Progress Notes (Signed)
Chief Complaint  Patient presents with  . Follow-up    follow up on blood pressure.     Patient presents for follow-up on her hypertension and diabetes.  Hypertension:  HCTZ 25mg  was added to her 10mg  amlodipine after her last visit. She reports she wasn't able to get refill of her amlodipine last week,so has been taking 1/2 tablet daily since last week. Pharmacy never contacted [place] for refills. She states she called there last week. She got all her other refills last week.   BP's at work were 138/90 a few days ago.  She can't recall any other values, such as when she was taking the full amlodipine tablet. Denies muscle cramps or side effects.   She only gets headaches if she doesn't wear her glasses.  No headaches since her last visit. Denies chest pain, palpitations.  She has appointment with Dr. Willis Modena next week, plans to start OCP's.   Diabetes:  Her diabetes was not well controlled, per A1c done at her last visit.  She admitted to drinking a lot of regular soda and poor diet, related to stressors at work. Lab Results  Component Value Date   HGBA1C 8.1 04/30/2017  She was only takingMetforminonce daily (prescribed for BID), admits that taking it twice daily gave her diarrhea. Since last visit, she sometimes takes it BID, if she isn't working, only takes it once daily when working. . We had encouraged her to enroll in [facility] through [facility].  We had discussed meds such as Farxiga/Jardiance vs injectable (Trulicity, Bydureon B-cise). She was interested in the injectable (believe it is Trulicity through [employer], available without copay--she was to look into).  She contacted someone through her wellness program, and is waiting for them to call back. Thinks it is no longer called [employer], [facility].  She went once for counseling through [facility]--went better than expected, has another visit scheduled  PMH, PSH, SH reviewed  Outpatient Encounter Medications as of  06/02/2017  Medication Sig Note  . Ferrous Sulfate (IRON) 325 (65 Fe) MG TABS Take 1 tablet by mouth every other day.   . hydrochlorothiazide (HYDRODIURIL) 25 MG tablet Take 1 tablet (25 mg total) by mouth daily.   . metFORMIN (GLUCOPHAGE) 500 MG tablet Take 1 tablet (500 mg total) by mouth 2 (two) times daily with a meal. 04/30/2017: Only taking it once daily--BID causes diarrhea  . acetaminophen (TYLENOL) 500 MG tablet Take 1 tablet (500 mg total) by mouth every 6 (six) hours as needed. (Patient not taking: Reported on 03/10/2017)   . albuterol (PROVENTIL HFA;VENTOLIN HFA) 108 (90 Base) MCG/ACT inhaler Inhale 2 puffs into the lungs every 6 (six) hours as needed for wheezing or shortness of breath. (Patient not taking: Reported on 04/30/2017)   . amLODipine (NORVASC) 10 MG tablet Take 1 tablet (10 mg total) by mouth at bedtime.   . Dulaglutide (TRULICITY) 0.62 BJ/6.2GB SOPN Inject under the skin once weekly as directed   . [DISCONTINUED] amLODipine (NORVASC) 10 MG tablet Take 0.5 tablets (5 mg total) by mouth at bedtime. (Patient not taking: Reported on 06/02/2017)    No facility-administered encounter medications on file as of 06/02/2017.    Allergies  Allergen Reactions  . Dilaudid [Hydromorphone Hcl] Hives  . Morphine And Related Hives  . Peanut-Containing Drug Products Hives  . Strawberry Extract Swelling    Swelling is of the eye.    ROS:  No headaches (except when she doesn't wear her glasses), dizziness, URI symptoms, shortness of  breath, chest pain, GI or GU complaints.  No bleeding, bruising, rash.   PHYSICAL EXAM:  BP (!) 150/100   Pulse 80   Ht 5\' 4"  (1.626 m)   Wt (!) 301 lb 12.8 oz (136.9 kg)   LMP 05/25/2017 (Exact Date)   Breastfeeding? No   BMI 51.80 kg/m   Wt Readings from Last 3 Encounters:  06/02/17 (!) 301 lb 12.8 oz (136.9 kg)  04/30/17 (!) 303 lb 3.2 oz (137.5 kg)  03/10/17 297 lb 12.8 oz (135.1 kg)   Well appearing, pleasant, obese female, in good spirits,  accompanied by her youngest son HEENT: conjunctiva and sclera are clear, EOMI Neck: no lymphadenopathy or mass Heart: regular rate and rhythm Lungs: clear bilaterally Abdomen: soft, nontender, obese Extremities: no edema Psych: normal mood, affect, hygiene and grooming Neuro: alert and oriented, cranial nerves intact, normal strength, gait Skin: normal turgor, no rash  ASSESSMENT/PLAN:   Essential hypertension, benign - elevated today, taking only 1/2 tablet of amlodipine; resume 10mg , cont HCTZ; low Na diet, exercise, wt loss. Add ACEI or ARB if not at goal at f/u when on OCP - Plan: amLODipine (NORVASC) 10 MG tablet  Type 2 diabetes mellitus without complication, without long-term current use of insulin ([place]) - shown with demo pen how to give Trulicity. Rx sent, and given voucher for free month. To f/u with wellness program. f/u here 6 weeks with list of glu and BP - Plan: Dulaglutide (TRULICITY) 5.80 DX/8.3JA SOPN   F/u 6 weeks with list of BP's and sugars. Will need chem panel at f/u (on HCTZ) Will need lipids in future as well (not fasting today)  Will need repeat microalbumin also in future (not now, recheck when BP  and sugars are improving).  Really needs to be on ARB or ACEI due to diabetes and microalbuminuria. Until recent visit, she had been nursing. She plans to start OCP's at upcoming GYN visit.   Start the Trulicity (once weekly, as shown by [place]).  You were given a card to get 1 month supply free. Hopefully you will be able to continue to get this free (no copay) through [facility] program. Please monitor your sugars regularly. Be sure to eat small meals in order to decrease the nausea associated with this medication.  Restart the amlodipine at full tablet--refill was sent to your pharmacy.  Make sure they call us next month for the HCTZ--please don't let it run out before we see you again.  I really want to know what your blood pressure runs when you are taking BOTH  medications.  Continue counseling. Continue to try and get regular exercise and follow a low sodium diet.  If your blood pressure remains high, when you are on a good form of contraception (ie birth control pills), then we can add other medications which are recommended for diabetes (ie losartan or lisinopril).   Return in 6 weeks. Please bring list of blood pressures and blood sugars with you to your next appointment. Feel free to contact us sooner with ANY questions or concerns, regarding your medications or treatment.

## 2017-06-02 ENCOUNTER — Ambulatory Visit (INDEPENDENT_AMBULATORY_CARE_PROVIDER_SITE_OTHER): Payer: 59 | Admitting: Family Medicine

## 2017-06-02 ENCOUNTER — Encounter: Payer: Self-pay | Admitting: Family Medicine

## 2017-06-02 VITALS — BP 150/100 | HR 80 | Ht 64.0 in | Wt 301.8 lb

## 2017-06-02 DIAGNOSIS — E119 Type 2 diabetes mellitus without complications: Secondary | ICD-10-CM

## 2017-06-02 DIAGNOSIS — I10 Essential (primary) hypertension: Secondary | ICD-10-CM

## 2017-06-02 MED ORDER — AMLODIPINE BESYLATE 10 MG PO TABS: 10.0000 mg | ORAL_TABLET | Freq: Every day | ORAL | 5 refills | Status: DC

## 2017-06-02 MED ORDER — DULAGLUTIDE 0.75 MG/0.5ML ~~LOC~~ SOAJ: SUBCUTANEOUS | 2 refills | Status: DC

## 2017-06-02 MED FILL — AMLODIPINE BESYLATE 10 MG T: 10 | 30 days supply | Qty: 30 | Fill #0

## 2017-06-02 NOTE — Patient Instructions (Signed)
  Start the Trulicity (once weekly, as shown by [place]).  You were given a card to get 1 month supply free. Hopefully you will be able to continue to get this free (no copay) through [facility] program. Please monitor your sugars regularly. Be sure to eat small meals in order to decrease the nausea associated with this medication.  Restart the amlodipine at full tablet--refill was sent to your pharmacy.  Make sure they call us next month for the HCTZ--please don't let it run out before we see you again.  I really want to know what your blood pressure runs when you are taking BOTH medications.  Continue counseling. Continue to try and get regular exercise and follow a low sodium diet.  If your blood pressure remains high, when you are on a good form of contraception (ie birth control pills), then we can add other medications which are recommended for diabetes (ie losartan or lisinopril).   Return in 6 weeks. Please bring list of blood pressures and blood sugars with you to your next appointment. Feel free to contact us sooner with ANY questions or concerns, regarding your medications or treatment.

## 2017-06-26 MED FILL — TRULICITY 0.75 MG/0.5 ML PE: 0.75 | 28 days supply | Qty: 2 | Fill #0

## 2017-07-04 ENCOUNTER — Encounter: Payer: Self-pay | Admitting: Family Medicine

## 2017-07-07 DIAGNOSIS — Z3009 Encounter for other general counseling and advice on contraception: Secondary | ICD-10-CM | POA: Diagnosis not present

## 2017-07-07 DIAGNOSIS — Z130 Encounter for screening for diseases of the blood and blood-forming organs and certain disorders involving the immune mechanism: Secondary | ICD-10-CM | POA: Diagnosis not present

## 2017-07-07 DIAGNOSIS — Z1389 Encounter for screening for other disorder: Secondary | ICD-10-CM | POA: Diagnosis not present

## 2017-07-07 DIAGNOSIS — Z01419 Encounter for gynecological examination (general) (routine) without abnormal findings: Secondary | ICD-10-CM | POA: Diagnosis not present

## 2017-07-07 MED FILL — NORETHINDRONE 0.35 MG TAB: 0.35 | 28 days supply | Qty: 28 | Fill #0

## 2017-07-23 MED FILL — HYDROCHLOROTHIAZIDE 25 MG T: 25 | 30 days supply | Qty: 30 | Fill #1

## 2017-07-23 MED FILL — TRULICITY 0.75 MG/0.5 ML PE: 0.75 | 28 days supply | Qty: 2 | Fill #1

## 2017-07-23 MED FILL — AMLODIPINE BESYLATE 10 MG T: 10 | 30 days supply | Qty: 30 | Fill #1

## 2017-08-18 MED FILL — NORETHINDRONE 0.35 MG TAB: 0.35 | 84 days supply | Qty: 84 | Fill #1

## 2017-08-19 ENCOUNTER — Encounter (HOSPITAL_COMMUNITY): Payer: Self-pay

## 2017-08-19 ENCOUNTER — Other Ambulatory Visit: Payer: Self-pay

## 2017-08-19 ENCOUNTER — Emergency Department (HOSPITAL_COMMUNITY)
Admission: EM | Admit: 2017-08-19 | Discharge: 2017-08-19 | Disposition: A | Discharge status: Home / Self Care | Payer: 59 | Attending: Emergency Medicine | Admitting: Emergency Medicine

## 2017-08-19 ENCOUNTER — Emergency Department (HOSPITAL_COMMUNITY): Payer: 59

## 2017-08-19 DIAGNOSIS — R1084 Generalized abdominal pain: Secondary | ICD-10-CM

## 2017-08-19 DIAGNOSIS — R1012 Left upper quadrant pain: Secondary | ICD-10-CM | POA: Insufficient documentation

## 2017-08-19 DIAGNOSIS — Z79899 Other long term (current) drug therapy: Secondary | ICD-10-CM | POA: Diagnosis not present

## 2017-08-19 DIAGNOSIS — Z7984 Long term (current) use of oral hypoglycemic drugs: Secondary | ICD-10-CM | POA: Insufficient documentation

## 2017-08-19 DIAGNOSIS — Z91010 Allergy to peanuts: Secondary | ICD-10-CM | POA: Insufficient documentation

## 2017-08-19 DIAGNOSIS — E119 Type 2 diabetes mellitus without complications: Secondary | ICD-10-CM | POA: Insufficient documentation

## 2017-08-19 DIAGNOSIS — R109 Unspecified abdominal pain: Secondary | ICD-10-CM | POA: Diagnosis not present

## 2017-08-19 DIAGNOSIS — I10 Essential (primary) hypertension: Secondary | ICD-10-CM | POA: Insufficient documentation

## 2017-08-19 DIAGNOSIS — J45909 Unspecified asthma, uncomplicated: Secondary | ICD-10-CM | POA: Diagnosis not present

## 2017-08-19 LAB — COMPREHENSIVE METABOLIC PANEL
ALBUMIN: 3.7 g/dL (ref 3.5–5.0)
ALK PHOS: 65 U/L (ref 38–126)
ALT: 17 U/L (ref 14–54)
AST: 19 U/L (ref 15–41)
Anion gap: 10 (ref 5–15)
BILIRUBIN TOTAL: 0.4 mg/dL (ref 0.3–1.2)
BUN: 11 mg/dL (ref 6–20)
CO2: 25 mmol/L (ref 22–32)
CREATININE: 0.55 mg/dL (ref 0.44–1.00)
Calcium: 9.3 mg/dL (ref 8.9–10.3)
Chloride: 108 mmol/L (ref 101–111)
GFR calc Af Amer: >60 mL/min (ref >60)
GLUCOSE: 92 mg/dL (ref 65–99)
Potassium: 2.8 mmol/L — ABNORMAL LOW (ref 3.5–5.1)
Sodium: 143 mmol/L (ref 135–145)
TOTAL PROTEIN: 7.4 g/dL (ref 6.5–8.1)

## 2017-08-19 LAB — CBC
HEMATOCRIT: 35.5 % — AB (ref 36.0–46.0)
Hemoglobin: 11.8 g/dL — ABNORMAL LOW (ref 12.0–15.0)
MCH: 25.9 pg — ABNORMAL LOW (ref 26.0–34.0)
MCHC: 33.2 g/dL (ref 30.0–36.0)
MCV: 77.9 fL — ABNORMAL LOW (ref 78.0–100.0)
PLATELETS: 318 10*3/uL (ref 150–400)
RBC: 4.56 MIL/uL (ref 3.87–5.11)
RDW: 14.4 % (ref 11.5–15.5)
WBC: 7.3 10*3/uL (ref 4.0–10.5)

## 2017-08-19 LAB — LIPASE, BLOOD: Lipase: 25 U/L (ref 11–51)

## 2017-08-19 LAB — I-STAT BETA HCG BLOOD, ED (MC, WL, AP ONLY): I-stat hCG, quantitative: <5 m[IU]/mL (ref <5)

## 2017-08-19 MED ORDER — HYDROCODONE-ACETAMINOPHEN 5-325 MG PO TABS: 1.0000 | ORAL_TABLET | ORAL | 0 refills | Status: DC | PRN

## 2017-08-19 MED ORDER — SODIUM CHLORIDE 0.9 % IV BOLUS
1000.0000 mL | Freq: Once | INTRAVENOUS | Status: AC
  Administered 2017-08-19: 1000 mL via INTRAVENOUS

## 2017-08-19 MED ORDER — POTASSIUM CHLORIDE CRYS ER 20 MEQ PO TBCR: 40.0000 meq | EXTENDED_RELEASE_TABLET | Freq: Once | ORAL | Status: DC

## 2017-08-19 MED ORDER — POTASSIUM CHLORIDE 10 MEQ/100ML IV SOLN
10.0000 meq | INTRAVENOUS | Status: AC
  Administered 2017-08-19 (×2): 10 meq via INTRAVENOUS
  Filled 2017-08-19 (×2): qty 100

## 2017-08-19 MED ORDER — IOPAMIDOL (ISOVUE-300) INJECTION 61%
INTRAVENOUS | Status: AC
  Administered 2017-08-19: 100 mL
  Filled 2017-08-19: qty 100

## 2017-08-19 MED ORDER — FENTANYL CITRATE (PF) 100 MCG/2ML IJ SOLN
100.0000 ug | INTRAMUSCULAR | Status: DC | PRN
  Administered 2017-08-19: 100 ug via INTRAVENOUS
  Filled 2017-08-19: qty 2

## 2017-08-19 MED ORDER — ONDANSETRON HCL 4 MG/2ML IJ SOLN
4.0000 mg | Freq: Once | INTRAMUSCULAR | Status: AC
  Administered 2017-08-19: 4 mg via INTRAVENOUS
  Filled 2017-08-19: qty 2

## 2017-08-19 MED ORDER — ONDANSETRON 4 MG PO TBDP
4.0000 mg | ORAL_TABLET | Freq: Once | ORAL | Status: AC | PRN
  Administered 2017-08-19: 4 mg via ORAL
  Filled 2017-08-19: qty 1

## 2017-08-19 NOTE — ED Triage Notes (Signed)
Pt is diabetic, just checked CBG PTA, it was 89.

## 2017-08-19 NOTE — ED Triage Notes (Signed)
Pt c/o generalized abdominal pain, nausea, bloating, and dizziness with movement starting yesterday. No vomiting. Denies chest pain, shortness of breath. Dizziness resolves when sitting down. A/Ox4.
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
## 2017-08-19 NOTE — Discharge Instructions (Addendum)
The tests today are reassuring that there is no serious problem causing your abdominal pain and swelling.  We are prescribing a short course of pain medicine to use if needed.  You might get relief of pain using Tylenol.  Do not drive when you are taking the narcotic pain reliever.  Start with clear liquids and gradually advance your diet to regular food over the next 2-3 days.  Follow-up with your doctor tomorrow as scheduled for a checkup.  Return here, if needed, for problems.

## 2017-08-19 NOTE — Progress Notes (Signed)
Chief Complaint  Patient presents with  . Hypertension    fasting med check. Hasn't taken any routine meds x 2 days due to abdominal pain-ER visit.     Patient presents for f/u on blood pressure and diabetes. She was last seen here in January, at which time her BP's were elevated due to running out of amlodipine.  She has been taking her amlodipine and HCTZ. BP's have been running up to 135/87, usually 125-130's/low to mid 80's. (forgot to bring list with her today)  No headaches, dizziness, side effects. No muscle cramps.  She didn't end up starting the Trulicity until early February, through [facility] program. She gets a call from a nurse once a month, reviews blood sugars, gives tips.  She has nausea "sour stomach" if she eats too much, but overall is tolerating this fine. Sugars are running 120's-130's.  Had 200's only once when she forgot her shot (took 4 days late). She continues on the metformin, tolerating well. She has been skipping the morning metformin the day she takes her Trulicity. (forgot to bring her list of sugars)  Lab Results  Component Value Date   HGBA1C 8.1 04/30/2017    She went to ER yesterday with abdominal pain, nausea and bloating. She has diffuse abdominal pain anytime she tries to eat or drink.  Had normal CT and labs, except for hypokalemia with K+2.8. This was repleted in the ED.  (originally was supposed to have 6 wk f/u here, mid Feb, and chem was to be checked at that time, but due to delay in starting diabetes injection and BP's okay, visit was posponed). BP's were initially very high, but normal later during visit. She was rx'd #10 hydrocodone, which she has not taken.  +sick contact (supervisor with GI bug). She denies any vomiting or diarrhea--just stool changes related to the metformin.  She continues to feel weak, tired. She is only sipping on soda, but even that hurts her stomach. She was told to be on clear liquids. She had taken some  Pepto Bismol the night before ER visit, which temporarily helped some.  Dr. Willis Modena put her on OCP's--on norethindrone.  She has missed a couple of days, and is now bleeding/cramping some. She is continuing to use condoms as back-up.  PMH, PSH, SH reviewed  Outpatient Encounter Medications as of 08/20/2017  Medication Sig Note  . Dulaglutide (TRULICITY) 1.47 WG/9.5AO SOPN Inject under the skin once weekly as directed   . acetaminophen (TYLENOL) 500 MG tablet Take 1 tablet (500 mg total) by mouth every 6 (six) hours as needed. (Patient not taking: Reported on 03/10/2017)   . albuterol (PROVENTIL HFA;VENTOLIN HFA) 108 (90 Base) MCG/ACT inhaler Inhale 2 puffs into the lungs every 6 (six) hours as needed for wheezing or shortness of breath. (Patient not taking: Reported on 08/20/2017)   . amLODipine (NORVASC) 10 MG tablet Take 1 tablet (10 mg total) by mouth at bedtime. (Patient not taking: Reported on 08/20/2017)   . hydrochlorothiazide (HYDRODIURIL) 25 MG tablet Take 1 tablet (25 mg total) by mouth daily. (Patient not taking: Reported on 08/20/2017)   . HYDROcodone-acetaminophen (NORCO) 5-325 MG tablet Take 1 tablet by mouth every 4 (four) hours as needed. (Patient not taking: Reported on 08/20/2017)   . ibuprofen (ADVIL,MOTRIN) 200 MG tablet Take 800 mg by mouth daily as needed for moderate pain.   . metFORMIN (GLUCOPHAGE) 500 MG tablet Take 1 tablet (500 mg total) by mouth 2 (two) times daily with a  meal. (Patient not taking: Reported on 08/20/2017) 04/30/2017: Only taking it once daily--BID causes diarrhea  . Multiple Vitamins-Minerals (WOMENS DAILY FORMULA PO) Take 1 tablet by mouth daily.   . norethindrone (MICRONOR,CAMILA,ERRIN) 0.35 MG tablet Take 1 tablet by mouth daily.   . [DISCONTINUED] fentaNYL (SUBLIMAZE) injection 100 mcg    . [DISCONTINUED] potassium chloride SA (K-DUR,KLOR-CON) CR tablet 40 mEq     No facility-administered encounter medications on file as of 08/20/2017.    ROS: no  fever, chills, headaches, chest pain, URI symptoms. +feeling weak, tired, bloating, abdominal pain per HPI. No urinary complaints, bleeding, bruising, rash or other complaints.  See HPI.   PHYSICAL EXAM:  BP 140/80   Pulse 76   Ht 5\' 4"  (1.626 m)   Wt (!) 300 lb 12.8 oz (136.4 kg)   LMP 08/17/2017   BMI 51.63 kg/m   Wt Readings from Last 3 Encounters:  08/20/17 (!) 300 lb 12.8 oz (136.4 kg)  08/19/17 (!) 301 lb (136.5 kg)  06/02/17 (!) 301 lb 12.8 oz (136.9 kg)   148/96 on repeat by MD  Well-appearing, though tired-appearing, pleasant female, in no distress HEENT: EOMI, conjunctiva and sclera are clear, OP clear, moist mucus membranes. Heart: regular rate and rhythm Lungs: clear bilaterally Back: no spinal or CVA tenderness Abdomen: Active bowel sounds. +epigastric tenderness. No rebound tenderness or guarding. No organomegaly or mass Extremities: no edema Neuro: alert and oriented, cranial nerves intact, normal gait Psych: normal mood, affect, hygiene and grooming    Lab Results  Component Value Date   HGBA1C 7.2 08/20/2017   Lab Results  Component Value Date   WBC 7.3 08/19/2017   HGB 11.8 (L) 08/19/2017   HCT 35.5 (L) 08/19/2017   MCV 77.9 (L) 08/19/2017   PLT 318 08/19/2017     Chemistry      Component Value Date/Time   NA 143 08/19/2017 1551   K 2.8 (L) 08/19/2017 1551   CL 108 08/19/2017 1551   CO2 25 08/19/2017 1551   BUN 11 08/19/2017 1551   CREATININE 0.55 08/19/2017 1551   CREATININE 0.57 04/30/2017 1340      Component Value Date/Time   CALCIUM 9.3 08/19/2017 1551   ALKPHOS 65 08/19/2017 1551   AST 19 08/19/2017 1551   ALT 17 08/19/2017 1551   BILITOT 0.4 08/19/2017 1551     Lab Results  Component Value Date   LIPASE 25 08/19/2017    ASSESSMENT/PLAN:  Controlled type 2 diabetes mellitus with microalbuminuria, without long-term current use of insulin (HCC) - sugars improved since adding Trulicity to metformin. A1c improved (only on  Trulicity x 6 weeks), continue - Plan: HgB A1c  Hypokalemia - noted in ER; pt on HCTZ.  recheck K+ today, likely will need daily longterm supplementation - Plan: Basic metabolic panel, potassium chloride (K-DUR) 10 MEQ tablet  Medication monitoring encounter - Plan: Basic metabolic panel  Essential hypertension, benign - elevated today (hasn't taken meds); controlled per home #'s. Cont amlodipine and HCTZ  Epigastric pain - Dexilant samples given x10d. clears, advance to bland diet as tolerated. Contact us if not improving over the next few days. - Plan: dexlansoprazole (DEXILANT) 60 MG capsule   Start KCl 10 mEq daily. Check level today--may need more Will need f/u in 3-4 weeks (lab visit) to recheck.   It is fine to take your morning metformin the day that you take your Trulicity injection. Continue clear diet and advance to bland diet as tolerated.  Take the Dexilant (  samples) once daily to see if this helps with your abdominal pain.  There are 10 days worth of medication, take them all.   Avoid anti-inflammatories, use Tylenol if needed for pain. (don't take with the prescription pain medication, which also has acetaminophen in it).  Be sure to take your amlodipine today. If you're able to tolerate fluids without too much pain, then also resume the HCTZ.  Start taking potassium supplement once daily.  When we get your labs back tomorrow, if the potassium is still low, we may have you temporarily increase the dose.  But long-term supplement with the HCTZ is 10 mEq daily.  We likely will be rechecking this at some point in the next 3-4 weeks.

## 2017-08-20 ENCOUNTER — Ambulatory Visit (INDEPENDENT_AMBULATORY_CARE_PROVIDER_SITE_OTHER): Payer: 59 | Admitting: Family Medicine

## 2017-08-20 ENCOUNTER — Encounter: Payer: Self-pay | Admitting: Family Medicine

## 2017-08-20 VITALS — BP 140/80 | HR 76 | Ht 64.0 in | Wt 300.8 lb

## 2017-08-20 DIAGNOSIS — E876 Hypokalemia: Secondary | ICD-10-CM

## 2017-08-20 DIAGNOSIS — E1129 Type 2 diabetes mellitus with other diabetic kidney complication: Secondary | ICD-10-CM | POA: Diagnosis not present

## 2017-08-20 DIAGNOSIS — Z5181 Encounter for therapeutic drug level monitoring: Secondary | ICD-10-CM

## 2017-08-20 DIAGNOSIS — R809 Proteinuria, unspecified: Secondary | ICD-10-CM

## 2017-08-20 DIAGNOSIS — R1013 Epigastric pain: Secondary | ICD-10-CM | POA: Diagnosis not present

## 2017-08-20 DIAGNOSIS — I10 Essential (primary) hypertension: Secondary | ICD-10-CM

## 2017-08-20 LAB — POCT GLYCOSYLATED HEMOGLOBIN (HGB A1C): Hemoglobin A1C: 7.2

## 2017-08-20 MED ORDER — POTASSIUM CHLORIDE ER 10 MEQ PO TBCR: 10.0000 meq | EXTENDED_RELEASE_TABLET | Freq: Every day | ORAL | 5 refills | Status: DC

## 2017-08-20 MED ORDER — DEXLANSOPRAZOLE 60 MG PO CPDR: 60.0000 mg | DELAYED_RELEASE_CAPSULE | Freq: Every day | ORAL | 0 refills | Status: DC

## 2017-08-20 NOTE — Patient Instructions (Signed)
  It is fine to take your morning metformin the day that you take your Trulicity injection. Continue clear diet and advance to bland diet as tolerated.  Take the Dexilant (samples) once daily to see if this helps with your abdominal pain.  There are 10 days worth of medication, take them all.   Avoid anti-inflammatories, use Tylenol if needed for pain. (don't take with the prescription pain medication, which also has acetaminophen in it).  Be sure to take your amlodipine today. If you're able to tolerate fluids without too much pain, then also resume the HCTZ.  Start taking potassium supplement once daily.  When we get your labs back tomorrow, if the potassium is still low, we may have you temporarily increase the dose.  But long-term supplement with the HCTZ is 10 mEq daily.  We likely will be rechecking this at some point in the next 3-4 weeks.

## 2017-08-21 ENCOUNTER — Other Ambulatory Visit: Payer: Self-pay | Admitting: *Deleted

## 2017-08-21 DIAGNOSIS — E876 Hypokalemia: Secondary | ICD-10-CM

## 2017-08-21 DIAGNOSIS — Z5181 Encounter for therapeutic drug level monitoring: Secondary | ICD-10-CM

## 2017-08-21 LAB — BASIC METABOLIC PANEL
BUN / CREAT RATIO: 11 (ref 9–23)
BUN: 7 mg/dL (ref 6–20)
CO2: 24 mmol/L (ref 20–29)
CREATININE: 0.62 mg/dL (ref 0.57–1.00)
Calcium: 8.6 mg/dL — ABNORMAL LOW (ref 8.7–10.2)
Chloride: 106 mmol/L (ref 96–106)
GFR calc Af Amer: 136 mL/min/{1.73_m2} (ref >59)
GFR, EST NON AFRICAN AMERICAN: 118 mL/min/{1.73_m2} (ref >59)
Glucose: 86 mg/dL (ref 65–99)
Potassium: 3.6 mmol/L (ref 3.5–5.2)
SODIUM: 143 mmol/L (ref 134–144)

## 2017-09-10 ENCOUNTER — Other Ambulatory Visit: Payer: Self-pay | Admitting: Family Medicine

## 2017-09-10 DIAGNOSIS — I10 Essential (primary) hypertension: Secondary | ICD-10-CM

## 2017-09-10 MED FILL — AMLODIPINE BESYLATE 10 MG T: 10 | 30 days supply | Qty: 30 | Fill #2

## 2017-09-10 MED FILL — POTASSIUM CL 10 MEQ TAB SA: 10 | 30 days supply | Qty: 30 | Fill #0

## 2017-09-10 MED FILL — TRULICITY 0.75 MG/0.5 ML PE: 0.75 | 28 days supply | Qty: 2 | Fill #2

## 2017-09-11 MED FILL — HYDROCHLOROTHIAZIDE 25 MG T: 25 | 30 days supply | Qty: 30 | Fill #0

## 2017-09-17 ENCOUNTER — Telehealth: Payer: Self-pay | Admitting: Osteopathic Medicine

## 2017-09-17 ENCOUNTER — Ambulatory Visit: Payer: 59 | Admitting: Osteopathic Medicine

## 2017-09-17 DIAGNOSIS — Z0189 Encounter for other specified special examinations: Secondary | ICD-10-CM

## 2017-09-17 NOTE — Telephone Encounter (Signed)
No-show to establish care with Dr. Sheppard Coil, 09/17/17. If late or no-show again, will not accept patient to this clinic. Please call her to reschedule visit and inform her of this policy.

## 2017-09-25 ENCOUNTER — Telehealth: Payer: 59 | Admitting: Physician Assistant

## 2017-09-25 DIAGNOSIS — H1010 Acute atopic conjunctivitis, unspecified eye: Secondary | ICD-10-CM | POA: Diagnosis not present

## 2017-09-25 NOTE — Progress Notes (Signed)
We are sorry that you are not feeling well.  Here is how we plan to help!  Based on what you have shared with me it looks like you have conjunctivitis.  Conjunctivitis is a common inflammatory or infectious condition of the eye that is often referred to as "pink eye".  In most cases it is contagious (viral or bacterial). However, not all conjunctivitis requires antibiotics (ex. Allergic).  We have made appropriate suggestions for you based upon your presentation.  I recommend that you use OpconA, 1-2 drops every 4-6 hours (an over the counter allergy drop available at your local pharmacy).  Your pharmacist may have an alternative suggestion.  Pink eye can be highly contagious.  It is typically spread through direct contact with secretions, or contaminated objects or surfaces that one may have touched.  Strict handwashing is suggested with soap and water is urged.  If not available, use alcohol based had sanitizer.  Avoid unnecessary touching of the eye.  If you wear contact lenses, you will need to refrain from wearing them until you see no white discharge from the eye for at least 24 hours after being on medication.  You should see symptom improvement in 1-2 days after starting the medication regimen.  Call us if symptoms are not improved in 1-2 days.  Home Care:  Wash your hands often!  Do not wear your contacts until you complete your treatment plan.  Avoid sharing towels, bed linen, personal items with a person who has pink eye.  See attention for anyone in your home with similar symptoms.  Get Help Right Away If:  Your symptoms do not improve.  You develop blurred or loss of vision.  Your symptoms worsen (increased discharge, pain or redness)  Your e-visit answers were reviewed by a board certified advanced clinical practitioner to complete your personal care plan.  Depending on the condition, your plan could have included both over the counter or prescription medications.  If there  is a problem please reply  once you have received a response from your provider.  Your safety is important to us.  If you have drug allergies check your prescription carefully.    You can use MyChart to ask questions about today's visit, request a non-urgent call back, or ask for a work or school excuse for 24 hours related to this e-Visit. If it has been greater than 24 hours you will need to follow up with your provider, or enter a new e-Visit to address those concerns.   You will get an e-mail in the next two days asking about your experience.  I hope that your e-visit has been valuable and will speed your recovery. Thank you for using e-visits.     

## 2017-10-30 ENCOUNTER — Other Ambulatory Visit: Payer: 59

## 2017-11-07 ENCOUNTER — Other Ambulatory Visit: Payer: Self-pay | Admitting: Family Medicine

## 2017-11-07 ENCOUNTER — Other Ambulatory Visit: Payer: 59

## 2017-11-07 DIAGNOSIS — E876 Hypokalemia: Secondary | ICD-10-CM

## 2017-11-07 DIAGNOSIS — Z5181 Encounter for therapeutic drug level monitoring: Secondary | ICD-10-CM | POA: Diagnosis not present

## 2017-11-07 DIAGNOSIS — E1129 Type 2 diabetes mellitus with other diabetic kidney complication: Secondary | ICD-10-CM

## 2017-11-07 DIAGNOSIS — R809 Proteinuria, unspecified: Principal | ICD-10-CM

## 2017-11-07 MED FILL — HYDROCHLOROTHIAZIDE 25 MG T: 25 | 30 days supply | Qty: 30 | Fill #1

## 2017-11-07 MED FILL — POTASSIUM CL 10 MEQ TAB SA: 10 | 30 days supply | Qty: 30 | Fill #1

## 2017-11-07 MED FILL — AMLODIPINE BESYLATE 10 MG T: 10 | 30 days supply | Qty: 30 | Fill #3

## 2017-11-07 MED FILL — NORETHINDRONE 0.35 MG TAB: 0.35 | 84 days supply | Qty: 84 | Fill #2

## 2017-11-07 MED FILL — metFORMIN HCL 500 MG TABS: 500 | 90 days supply | Qty: 180 | Fill #0

## 2017-11-08 LAB — BASIC METABOLIC PANEL
BUN / CREAT RATIO: 16 (ref 9–23)
BUN: 11 mg/dL (ref 6–20)
CALCIUM: 8.9 mg/dL (ref 8.7–10.2)
CHLORIDE: 106 mmol/L (ref 96–106)
CO2: 23 mmol/L (ref 20–29)
Creatinine, Ser: 0.67 mg/dL (ref 0.57–1.00)
GFR, EST AFRICAN AMERICAN: 133 mL/min/{1.73_m2} (ref >59)
GFR, EST NON AFRICAN AMERICAN: 115 mL/min/{1.73_m2} (ref >59)
Glucose: 118 mg/dL — ABNORMAL HIGH (ref 65–99)
Potassium: 3.5 mmol/L (ref 3.5–5.2)
Sodium: 142 mmol/L (ref 134–144)

## 2017-11-10 ENCOUNTER — Telehealth: Payer: Self-pay

## 2017-11-10 NOTE — Telephone Encounter (Signed)
Left message on voicemail for patient to call back and schedule appointment for med check/ Diabetes check.

## 2017-11-30 ENCOUNTER — Encounter (HOSPITAL_COMMUNITY): Payer: Self-pay | Admitting: Emergency Medicine

## 2017-11-30 ENCOUNTER — Observation Stay (HOSPITAL_COMMUNITY)
Admission: EM | Admit: 2017-11-30 | Discharge: 2017-12-02 | Disposition: A | Discharge status: Home / Self Care | Payer: 59 | Attending: Internal Medicine | Admitting: Internal Medicine

## 2017-11-30 ENCOUNTER — Emergency Department (HOSPITAL_COMMUNITY): Payer: 59

## 2017-11-30 DIAGNOSIS — E872 Acidosis, unspecified: Secondary | ICD-10-CM

## 2017-11-30 DIAGNOSIS — R0789 Other chest pain: Secondary | ICD-10-CM | POA: Diagnosis not present

## 2017-11-30 DIAGNOSIS — J45901 Unspecified asthma with (acute) exacerbation: Secondary | ICD-10-CM | POA: Diagnosis present

## 2017-11-30 DIAGNOSIS — E876 Hypokalemia: Secondary | ICD-10-CM | POA: Insufficient documentation

## 2017-11-30 DIAGNOSIS — E1129 Type 2 diabetes mellitus with other diabetic kidney complication: Secondary | ICD-10-CM

## 2017-11-30 DIAGNOSIS — Z7984 Long term (current) use of oral hypoglycemic drugs: Secondary | ICD-10-CM | POA: Diagnosis not present

## 2017-11-30 DIAGNOSIS — R0602 Shortness of breath: Secondary | ICD-10-CM | POA: Diagnosis not present

## 2017-11-30 DIAGNOSIS — Z8249 Family history of ischemic heart disease and other diseases of the circulatory system: Secondary | ICD-10-CM | POA: Insufficient documentation

## 2017-11-30 DIAGNOSIS — Z6841 Body Mass Index (BMI) 40.0 and over, adult: Secondary | ICD-10-CM | POA: Diagnosis not present

## 2017-11-30 DIAGNOSIS — Z79899 Other long term (current) drug therapy: Secondary | ICD-10-CM | POA: Diagnosis not present

## 2017-11-30 DIAGNOSIS — J45909 Unspecified asthma, uncomplicated: Secondary | ICD-10-CM | POA: Diagnosis not present

## 2017-11-30 DIAGNOSIS — R079 Chest pain, unspecified: Secondary | ICD-10-CM | POA: Diagnosis not present

## 2017-11-30 DIAGNOSIS — Z793 Long term (current) use of hormonal contraceptives: Secondary | ICD-10-CM | POA: Diagnosis not present

## 2017-11-30 DIAGNOSIS — I10 Essential (primary) hypertension: Secondary | ICD-10-CM | POA: Diagnosis not present

## 2017-11-30 DIAGNOSIS — R0781 Pleurodynia: Secondary | ICD-10-CM | POA: Diagnosis not present

## 2017-11-30 DIAGNOSIS — G4733 Obstructive sleep apnea (adult) (pediatric): Secondary | ICD-10-CM | POA: Diagnosis not present

## 2017-11-30 DIAGNOSIS — I309 Acute pericarditis, unspecified: Secondary | ICD-10-CM | POA: Diagnosis not present

## 2017-11-30 DIAGNOSIS — E119 Type 2 diabetes mellitus without complications: Secondary | ICD-10-CM | POA: Diagnosis not present

## 2017-11-30 DIAGNOSIS — R809 Proteinuria, unspecified: Secondary | ICD-10-CM

## 2017-11-30 LAB — CBC
HEMATOCRIT: 36 % (ref 36.0–46.0)
Hemoglobin: 11.6 g/dL — ABNORMAL LOW (ref 12.0–15.0)
MCH: 25.3 pg — ABNORMAL LOW (ref 26.0–34.0)
MCHC: 32.2 g/dL (ref 30.0–36.0)
MCV: 78.6 fL (ref 78.0–100.0)
Platelets: 330 10*3/uL (ref 150–400)
RBC: 4.58 MIL/uL (ref 3.87–5.11)
RDW: 14.5 % (ref 11.5–15.5)
WBC: 7 10*3/uL (ref 4.0–10.5)

## 2017-11-30 LAB — BASIC METABOLIC PANEL
Anion gap: 16 — ABNORMAL HIGH (ref 5–15)
BUN: 8 mg/dL (ref 6–20)
CHLORIDE: 99 mmol/L (ref 98–111)
CO2: 18 mmol/L — ABNORMAL LOW (ref 22–32)
Calcium: 9.5 mg/dL (ref 8.9–10.3)
Creatinine, Ser: 0.75 mg/dL (ref 0.44–1.00)
GFR calc Af Amer: >60 mL/min (ref >60)
GFR calc non Af Amer: >60 mL/min (ref >60)
Glucose, Bld: 285 mg/dL — ABNORMAL HIGH (ref 70–99)
POTASSIUM: 2.6 mmol/L — AB (ref 3.5–5.1)
Sodium: 133 mmol/L — ABNORMAL LOW (ref 135–145)

## 2017-11-30 LAB — I-STAT BETA HCG BLOOD, ED (MC, WL, AP ONLY)

## 2017-11-30 LAB — I-STAT TROPONIN, ED: Troponin i, poc: 0 ng/mL (ref 0.00–0.08)

## 2017-11-30 MED ORDER — IPRATROPIUM-ALBUTEROL 0.5-2.5 (3) MG/3ML IN SOLN
3.0000 mL | Freq: Once | RESPIRATORY_TRACT | Status: AC
  Administered 2017-11-30: 3 mL via RESPIRATORY_TRACT
  Filled 2017-11-30: qty 3

## 2017-11-30 MED ORDER — ALBUTEROL SULFATE (2.5 MG/3ML) 0.083% IN NEBU
INHALATION_SOLUTION | RESPIRATORY_TRACT | Status: AC
  Filled 2017-11-30: qty 6

## 2017-11-30 MED ORDER — POTASSIUM CHLORIDE 10 MEQ/100ML IV SOLN
10.0000 meq | Freq: Once | INTRAVENOUS | Status: AC
  Administered 2017-11-30: 10 meq via INTRAVENOUS
  Filled 2017-11-30: qty 100

## 2017-11-30 MED ORDER — LORAZEPAM 2 MG/ML IJ SOLN: 1.0000 mg | Freq: Once | INTRAMUSCULAR | Status: DC

## 2017-11-30 MED ORDER — METHYLPREDNISOLONE SODIUM SUCC 125 MG IJ SOLR
125.0000 mg | Freq: Once | INTRAMUSCULAR | Status: AC
  Administered 2017-11-30: 125 mg via INTRAVENOUS
  Filled 2017-11-30: qty 2

## 2017-11-30 MED ORDER — ALBUTEROL SULFATE (2.5 MG/3ML) 0.083% IN NEBU
2.5000 mg | INHALATION_SOLUTION | Freq: Once | RESPIRATORY_TRACT | Status: AC
  Administered 2017-11-30: 2.5 mg via RESPIRATORY_TRACT
  Filled 2017-11-30: qty 3

## 2017-11-30 MED ORDER — LORAZEPAM 2 MG/ML IJ SOLN
0.5000 mg | Freq: Once | INTRAMUSCULAR | Status: AC
  Administered 2017-11-30: 0.5 mg via INTRAVENOUS
  Filled 2017-11-30: qty 1

## 2017-11-30 NOTE — ED Triage Notes (Signed)
Pt reports SOB and chest tightness onset this AM. Pt noted to have labored breathing in triage, diminished lung sounds. States hx of asthma.
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
## 2017-12-01 ENCOUNTER — Emergency Department (HOSPITAL_COMMUNITY): Payer: 59

## 2017-12-01 ENCOUNTER — Observation Stay (HOSPITAL_COMMUNITY): Payer: 59

## 2017-12-01 ENCOUNTER — Encounter (HOSPITAL_COMMUNITY): Payer: Self-pay | Admitting: Family Medicine

## 2017-12-01 ENCOUNTER — Other Ambulatory Visit: Payer: Self-pay

## 2017-12-01 ENCOUNTER — Observation Stay (HOSPITAL_BASED_OUTPATIENT_CLINIC_OR_DEPARTMENT_OTHER): Payer: 59

## 2017-12-01 ENCOUNTER — Ambulatory Visit (HOSPITAL_BASED_OUTPATIENT_CLINIC_OR_DEPARTMENT_OTHER): Payer: 59

## 2017-12-01 DIAGNOSIS — I10 Essential (primary) hypertension: Secondary | ICD-10-CM | POA: Diagnosis not present

## 2017-12-01 DIAGNOSIS — R079 Chest pain, unspecified: Secondary | ICD-10-CM

## 2017-12-01 DIAGNOSIS — E876 Hypokalemia: Secondary | ICD-10-CM | POA: Diagnosis not present

## 2017-12-01 DIAGNOSIS — M79609 Pain in unspecified limb: Secondary | ICD-10-CM | POA: Diagnosis not present

## 2017-12-01 DIAGNOSIS — R809 Proteinuria, unspecified: Secondary | ICD-10-CM | POA: Diagnosis not present

## 2017-12-01 DIAGNOSIS — R0781 Pleurodynia: Secondary | ICD-10-CM | POA: Diagnosis not present

## 2017-12-01 DIAGNOSIS — E1129 Type 2 diabetes mellitus with other diabetic kidney complication: Secondary | ICD-10-CM

## 2017-12-01 DIAGNOSIS — J45909 Unspecified asthma, uncomplicated: Secondary | ICD-10-CM | POA: Diagnosis not present

## 2017-12-01 DIAGNOSIS — J4521 Mild intermittent asthma with (acute) exacerbation: Secondary | ICD-10-CM | POA: Diagnosis not present

## 2017-12-01 DIAGNOSIS — R0602 Shortness of breath: Secondary | ICD-10-CM | POA: Diagnosis not present

## 2017-12-01 DIAGNOSIS — I309 Acute pericarditis, unspecified: Secondary | ICD-10-CM | POA: Diagnosis not present

## 2017-12-01 LAB — URINALYSIS, ROUTINE W REFLEX MICROSCOPIC
Bilirubin Urine: NEGATIVE
GLUCOSE, UA: 50 mg/dL — AB
KETONES UR: NEGATIVE mg/dL
Leukocytes, UA: NEGATIVE
NITRITE: POSITIVE — AB
PH: 7 (ref 5.0–8.0)
Protein, ur: NEGATIVE mg/dL
Specific Gravity, Urine: 1.013 (ref 1.005–1.030)

## 2017-12-01 LAB — I-STAT VENOUS BLOOD GAS, ED
Acid-base deficit: 2 mmol/L (ref 0.0–2.0)
Bicarbonate: 21.1 mmol/L (ref 20.0–28.0)
O2 Saturation: 81 %
PCO2 VEN: 29.8 mmHg — AB (ref 44.0–60.0)
PO2 VEN: 41 mmHg (ref 32.0–45.0)
Patient temperature: 98.1
TCO2: 22 mmol/L (ref 22–32)
pH, Ven: 7.457 — ABNORMAL HIGH (ref 7.250–7.430)

## 2017-12-01 LAB — BASIC METABOLIC PANEL
Anion gap: 12 (ref 5–15)
BUN: 8 mg/dL (ref 6–20)
CO2: 21 mmol/L — ABNORMAL LOW (ref 22–32)
CREATININE: 0.76 mg/dL (ref 0.44–1.00)
Calcium: 9 mg/dL (ref 8.9–10.3)
Chloride: 105 mmol/L (ref 98–111)
Glucose, Bld: 304 mg/dL — ABNORMAL HIGH (ref 70–99)
Potassium: 3.4 mmol/L — ABNORMAL LOW (ref 3.5–5.1)
SODIUM: 138 mmol/L (ref 135–145)

## 2017-12-01 LAB — GLUCOSE, CAPILLARY
GLUCOSE-CAPILLARY: 282 mg/dL — AB (ref 70–99)
GLUCOSE-CAPILLARY: 237 mg/dL — AB (ref 70–99)
Glucose-Capillary: 274 mg/dL — ABNORMAL HIGH (ref 70–99)
Glucose-Capillary: 231 mg/dL — ABNORMAL HIGH (ref 70–99)

## 2017-12-01 LAB — SALICYLATE LEVEL: Salicylate Lvl: <7 mg/dL (ref 2.8–30.0)

## 2017-12-01 LAB — ECHOCARDIOGRAM COMPLETE
Height: 62 in
WEIGHTICAEL: 4740.77 [oz_av]

## 2017-12-01 LAB — I-STAT TROPONIN, ED: Troponin i, poc: 0 ng/mL (ref 0.00–0.08)

## 2017-12-01 LAB — MAGNESIUM: Magnesium: 1.6 mg/dL — ABNORMAL LOW (ref 1.7–2.4)

## 2017-12-01 LAB — HIV ANTIBODY (ROUTINE TESTING W REFLEX): HIV Screen 4th Generation wRfx: NONREACTIVE

## 2017-12-01 LAB — TROPONIN I

## 2017-12-01 LAB — D-DIMER, QUANTITATIVE (NOT AT ARMC)

## 2017-12-01 MED ORDER — ENOXAPARIN SODIUM 80 MG/0.8ML ~~LOC~~ SOLN
70.0000 mg | SUBCUTANEOUS | Status: DC
  Administered 2017-12-01 – 2017-12-02 (×2): 70 mg via SUBCUTANEOUS
  Filled 2017-12-01: qty 0.7
  Filled 2017-12-01 (×2): qty 0.8

## 2017-12-01 MED ORDER — METHYLPREDNISOLONE SODIUM SUCC 125 MG IJ SOLR
60.0000 mg | Freq: Four times a day (QID) | INTRAMUSCULAR | Status: DC
  Administered 2017-12-01 – 2017-12-02 (×6): 60 mg via INTRAVENOUS
  Filled 2017-12-01 (×6): qty 2

## 2017-12-01 MED ORDER — FENTANYL CITRATE (PF) 100 MCG/2ML IJ SOLN
50.0000 ug | INTRAMUSCULAR | Status: DC | PRN
Start: 2017-12-01 — End: 2017-12-02
  Administered 2017-12-01: 50 ug via INTRAVENOUS
  Filled 2017-12-01: qty 2

## 2017-12-01 MED ORDER — INSULIN ASPART 100 UNIT/ML ~~LOC~~ SOLN
0.0000 [IU] | Freq: Three times a day (TID) | SUBCUTANEOUS | Status: DC
  Administered 2017-12-01 (×3): 5 [IU] via SUBCUTANEOUS
  Administered 2017-12-02: 7 [IU] via SUBCUTANEOUS
  Administered 2017-12-02: 5 [IU] via SUBCUTANEOUS

## 2017-12-01 MED ORDER — TECHNETIUM TO 99M ALBUMIN AGGREGATED
4.0000 | Freq: Once | INTRAVENOUS | Status: AC | PRN
  Administered 2017-12-01: 4 via INTRAVENOUS

## 2017-12-01 MED ORDER — LORAZEPAM 0.5 MG PO TABS: 0.5000 mg | ORAL_TABLET | Freq: Four times a day (QID) | ORAL | Status: DC | PRN

## 2017-12-01 MED ORDER — ONDANSETRON HCL 4 MG PO TABS: 4.0000 mg | ORAL_TABLET | Freq: Four times a day (QID) | ORAL | Status: DC | PRN

## 2017-12-01 MED ORDER — SODIUM CHLORIDE 0.9 % IV SOLN
250.0000 mL | INTRAVENOUS | Status: DC | PRN
Start: 2017-12-01 — End: 2017-12-02

## 2017-12-01 MED ORDER — ACETAMINOPHEN 325 MG PO TABS: 650.0000 mg | ORAL_TABLET | Freq: Four times a day (QID) | ORAL | Status: DC | PRN

## 2017-12-01 MED ORDER — KETOROLAC TROMETHAMINE 30 MG/ML IJ SOLN
30.0000 mg | Freq: Once | INTRAMUSCULAR | Status: AC
  Administered 2017-12-01: 30 mg via INTRAVENOUS
  Filled 2017-12-01: qty 1

## 2017-12-01 MED ORDER — HEPARIN (PORCINE) IN NACL 100-0.45 UNIT/ML-% IJ SOLN
1250.0000 [IU]/h | INTRAMUSCULAR | Status: DC
  Filled 2017-12-01: qty 250

## 2017-12-01 MED ORDER — LORAZEPAM 2 MG/ML IJ SOLN: 0.5000 mg | Freq: Once | INTRAMUSCULAR | Status: DC

## 2017-12-01 MED ORDER — POTASSIUM CHLORIDE CRYS ER 20 MEQ PO TBCR
40.0000 meq | EXTENDED_RELEASE_TABLET | Freq: Once | ORAL | Status: AC
  Administered 2017-12-01: 40 meq via ORAL
  Filled 2017-12-01: qty 2

## 2017-12-01 MED ORDER — IOPAMIDOL (ISOVUE-370) INJECTION 76%
100.0000 mL | Freq: Once | INTRAVENOUS | Status: AC | PRN
  Administered 2017-12-01: 52 mL via INTRAVENOUS

## 2017-12-01 MED ORDER — NORETHINDRONE 0.35 MG PO TABS: 1.0000 | ORAL_TABLET | Freq: Every day | ORAL | Status: DC

## 2017-12-01 MED ORDER — ONDANSETRON HCL 4 MG/2ML IJ SOLN: 4.0000 mg | Freq: Four times a day (QID) | INTRAMUSCULAR | Status: DC | PRN

## 2017-12-01 MED ORDER — HYDRALAZINE HCL 20 MG/ML IJ SOLN
10.0000 mg | INTRAMUSCULAR | Status: DC | PRN
  Administered 2017-12-01: 10 mg via INTRAVENOUS
  Filled 2017-12-01: qty 1

## 2017-12-01 MED ORDER — ACETAMINOPHEN 650 MG RE SUPP: 650.0000 mg | Freq: Four times a day (QID) | RECTAL | Status: DC | PRN

## 2017-12-01 MED ORDER — HEPARIN BOLUS VIA INFUSION
4500.0000 [IU] | Freq: Once | INTRAVENOUS | Status: DC
  Filled 2017-12-01: qty 4500

## 2017-12-01 MED ORDER — PANTOPRAZOLE SODIUM 40 MG PO TBEC
40.0000 mg | DELAYED_RELEASE_TABLET | Freq: Two times a day (BID) | ORAL | Status: DC
  Administered 2017-12-01 – 2017-12-02 (×3): 40 mg via ORAL
  Filled 2017-12-01 (×3): qty 1

## 2017-12-01 MED ORDER — INSULIN ASPART 100 UNIT/ML ~~LOC~~ SOLN
0.0000 [IU] | Freq: Every day | SUBCUTANEOUS | Status: DC
  Administered 2017-12-01: 2 [IU] via SUBCUTANEOUS

## 2017-12-01 MED ORDER — TECHNETIUM TC 99M DIETHYLENETRIAME-PENTAACETIC ACID
30.0000 | Freq: Once | INTRAVENOUS | Status: AC | PRN
  Administered 2017-12-01: 30 via RESPIRATORY_TRACT

## 2017-12-01 MED ORDER — IOPAMIDOL (ISOVUE-370) INJECTION 76%: 100.0000 mL | Freq: Once | INTRAVENOUS | Status: DC | PRN

## 2017-12-01 MED ORDER — POTASSIUM CHLORIDE 10 MEQ/100ML IV SOLN
10.0000 meq | INTRAVENOUS | Status: AC
  Administered 2017-12-01 (×3): 10 meq via INTRAVENOUS
  Filled 2017-12-01 (×2): qty 100

## 2017-12-01 MED ORDER — SODIUM CHLORIDE 0.9% FLUSH
3.0000 mL | INTRAVENOUS | Status: DC | PRN
Start: 2017-12-01 — End: 2017-12-02

## 2017-12-01 MED ORDER — SENNOSIDES-DOCUSATE SODIUM 8.6-50 MG PO TABS
1.0000 | ORAL_TABLET | Freq: Every evening | ORAL | Status: DC | PRN
Start: 2017-12-01 — End: 2017-12-02

## 2017-12-01 MED ORDER — FENTANYL CITRATE (PF) 100 MCG/2ML IJ SOLN
50.0000 ug | Freq: Once | INTRAMUSCULAR | Status: DC
  Filled 2017-12-01: qty 2

## 2017-12-01 MED ORDER — COLCHICINE 0.6 MG PO TABS
0.6000 mg | ORAL_TABLET | ORAL | Status: AC
  Administered 2017-12-01 (×2): 0.6 mg via ORAL
  Filled 2017-12-01 (×2): qty 1

## 2017-12-01 MED ORDER — SODIUM CHLORIDE 0.9% FLUSH
3.0000 mL | Freq: Two times a day (BID) | INTRAVENOUS | Status: DC
  Administered 2017-12-01 – 2017-12-02 (×2): 3 mL via INTRAVENOUS

## 2017-12-01 MED ORDER — MAGNESIUM SULFATE 2 GM/50ML IV SOLN
2.0000 g | Freq: Once | INTRAVENOUS | Status: AC
  Administered 2017-12-01: 2 g via INTRAVENOUS
  Filled 2017-12-01: qty 50

## 2017-12-01 MED ORDER — AMLODIPINE BESYLATE 10 MG PO TABS
10.0000 mg | ORAL_TABLET | Freq: Every day | ORAL | Status: DC
  Administered 2017-12-01: 10 mg via ORAL
  Filled 2017-12-01: qty 1

## 2017-12-01 MED ORDER — SODIUM CHLORIDE 0.9 % IV BOLUS
1000.0000 mL | Freq: Once | INTRAVENOUS | Status: AC
  Administered 2017-12-01: 1000 mL via INTRAVENOUS

## 2017-12-01 MED ORDER — ALBUTEROL SULFATE (2.5 MG/3ML) 0.083% IN NEBU
2.5000 mg | INHALATION_SOLUTION | RESPIRATORY_TRACT | Status: DC | PRN
  Administered 2017-12-01: 2.5 mg via RESPIRATORY_TRACT
  Filled 2017-12-01: qty 3

## 2017-12-01 MED ORDER — IBUPROFEN 600 MG PO TABS: 600.0000 mg | ORAL_TABLET | Freq: Four times a day (QID) | ORAL | Status: DC | PRN

## 2017-12-01 MED ORDER — BISACODYL 5 MG PO TBEC: 5.0000 mg | DELAYED_RELEASE_TABLET | Freq: Every day | ORAL | Status: DC | PRN

## 2017-12-01 NOTE — ED Provider Notes (Signed)
Care assumed from [facility], [place].  Please see her full H&P.  In short,  Victoria [name] is a 37 y.o. female with a history of asthma, diabetes and hypertension presents for torus of breath that started this morning and is worsened throughout the day.  She is associated chest tightness.  She is used her albuterol inhalers without improvement.  Tonight at work her symptoms worsen significantly prompting her visit to the emergency department.  She does report that she has been hospitalized for her asthma before but has never been intubated.  Patient reports that her shortness of breath tonight seems different and worse than usual.  Patient denies any recent sick contacts or recent illness.  Physical Exam  BP (!) 151/102   Pulse (!) 102   Temp 98.1 F (36.7 C) (Oral)   Resp (!) 22   Ht 5\' 2"  (1.575 m)   Wt 134.4 kg (296 lb 4.8 oz)   SpO2 98%   BMI 54.19 kg/m   Physical Exam  Constitutional: She appears well-developed and well-nourished. No distress.  HENT:  Head: Normocephalic.  Eyes: Conjunctivae are normal. No scleral icterus.  Neck: Normal range of motion.  Cardiovascular: Intact distal pulses. Tachycardia present.  Pulmonary/Chest: Accessory muscle usage present. Tachypnea noted.  Abdominal: She exhibits no distension.  Musculoskeletal: Normal range of motion.       Right lower leg: She exhibits tenderness.  Neurological: She is alert.  Skin: Skin is warm and dry.  Nursing note and vitals reviewed.   ED Course/Procedures   Clinical Course as of Dec 02 614  Mon Dec 01, 2017  0130 Plan: CT angiogram pending to rule out pulmonary embolism.   [HM]  0300 CT angiogram is a poor study but is without evidence of saddle pulmonary embolism.  Unable to assess for pulmonary embolism in the pulmonary branches.  I personally evaluated these images.   [HM]  0400 Patient remains significantly tachycardic and tachypneic.  She will need admission for further evaluation of what is likely  a pulmonary embolism.    [HM]  0430 Anemia noted  Hemoglobin(!): 11.6 [HM]  0430 Significantly hypokalemic.  Patient given oral and IV replacement.  Potassium(!!): 2.6 [HM]  0430 Low.  Patient given magnesium replacement along with her potassium.  Magnesium(!): 1.6 [HM]  0430 Low with elevated anion gap  CO2(!): 18 [HM]    Clinical Course User Index [HM] Zeenat Jeanbaptiste, Gwenlyn Perking    Results for orders placed or performed during the hospital encounter of 46/56/81  Basic metabolic panel  Result Value Ref Range   Sodium 133 (L) 135 - 145 mmol/L   Potassium 2.6 (LL) 3.5 - 5.1 mmol/L   Chloride 99 98 - 111 mmol/L   CO2 18 (L) 22 - 32 mmol/L   Glucose, Bld 285 (H) 70 - 99 mg/dL   BUN 8 6 - 20 mg/dL   Creatinine, Ser 0.75 0.44 - 1.00 mg/dL   Calcium 9.5 8.9 - 10.3 mg/dL   GFR calc non Af Amer >60 >60 mL/min   GFR calc Af Amer >60 >60 mL/min   Anion gap 16 (H) 5 - 15  CBC  Result Value Ref Range   WBC 7.0 4.0 - 10.5 K/uL   RBC 4.58 3.87 - 5.11 MIL/uL   Hemoglobin 11.6 (L) 12.0 - 15.0 g/dL   HCT 36.0 36.0 - 46.0 %   MCV 78.6 78.0 - 100.0 fL   MCH 25.3 (L) 26.0 - 34.0 pg   MCHC 32.2 30.0 -  36.0 g/dL   RDW 14.5 11.5 - 15.5 %   Platelets 330 150 - 400 K/uL  Urinalysis, Routine w reflex microscopic  Result Value Ref Range   Color, Urine YELLOW YELLOW   APPearance HAZY (A) CLEAR   Specific Gravity, Urine 1.013 1.005 - 1.030   pH 7.0 5.0 - 8.0   Glucose, UA 50 (A) NEGATIVE mg/dL   Hgb urine dipstick LARGE (A) NEGATIVE   Bilirubin Urine NEGATIVE NEGATIVE   Ketones, ur NEGATIVE NEGATIVE mg/dL   Protein, ur NEGATIVE NEGATIVE mg/dL   Nitrite POSITIVE (A) NEGATIVE   Leukocytes, UA NEGATIVE NEGATIVE   RBC / HPF >50 (H) 0 - 5 RBC/hpf   WBC, UA 0-5 0 - 5 WBC/hpf   Bacteria, UA RARE (A) NONE SEEN   Squamous Epithelial / LPF 0-5 0 - 5   Mucus PRESENT   Salicylate level  Result Value Ref Range   Salicylate Lvl <5.2 2.8 - 30.0 mg/dL  Magnesium  Result Value Ref Range   Magnesium  1.6 (L) 1.7 - 2.4 mg/dL  I-stat troponin, ED  Result Value Ref Range   Troponin i, poc 0.00 0.00 - 0.08 ng/mL   Comment 3          I-Stat beta hCG blood, ED  Result Value Ref Range   I-stat hCG, quantitative <5.0 <5 mIU/mL   Comment 3          I-Stat venous blood gas, ED  Result Value Ref Range   pH, Ven 7.457 (H) 7.250 - 7.430   pCO2, Ven 29.8 (L) 44.0 - 60.0 mmHg   pO2, Ven 41.0 32.0 - 45.0 mmHg   Bicarbonate 21.1 20.0 - 28.0 mmol/L   TCO2 22 22 - 32 mmol/L   O2 Saturation 81.0 %   Acid-base deficit 2.0 0.0 - 2.0 mmol/L   Patient temperature 98.1 F    Sample type VENOUS   I-stat troponin, ED  Result Value Ref Range   Troponin i, poc 0.00 0.00 - 0.08 ng/mL   Comment 3           Dg Chest 2 View  Result Date: 11/30/2017 CLINICAL DATA:  Shortness of breath and chest tightness since this morning, labored breathing in triage, history asthma, type II diabetes mellitus EXAM: CHEST - 2 VIEW COMPARISON:  11/03/2016 FINDINGS: Normal heart size, mediastinal contours, and pulmonary vascularity. Lungs clear. No acute infiltrate, pleural effusion or pneumothorax. Bones unremarkable. IMPRESSION: No acute abnormalities. Electronically Signed   By: Lavonia Dana M.D.   On: 11/30/2017 20:38   Ct Angio Chest Pe W And/or Wo Contrast  Result Date: 12/01/2017 CLINICAL DATA:  Shortness of breath and chest pain. EXAM: CT ANGIOGRAPHY CHEST WITH CONTRAST TECHNIQUE: Multidetector CT imaging of the chest was performed using the standard protocol during bolus administration of intravenous contrast. Multiplanar CT image reconstructions and MIPs were obtained to evaluate the vascular anatomy. CONTRAST:  89mL ISOVUE-370 IOPAMIDOL (ISOVUE-370) INJECTION 76% COMPARISON:  06/15/2011 FINDINGS: Cardiovascular: Inadequate opacification of the pulmonary arteries to exclude pulmonary embolism beyond the main pulmonary arteries. This is multifactorial including reduced dose and body habitus. Normal heart size. No pericardial  effusion. Negative thoracic aorta. Mediastinum/Nodes: Negative for adenopathy. Residual thymus is unremarkable morphology, stable from 2013. Lungs/Pleura: Low volumes with interstitial crowding. There is no edema, consolidation, effusion, or pneumothorax. Upper Abdomen: Negative Musculoskeletal: Negative Review of the MIP images confirms the above findings. IMPRESSION: 1. Technically limited PE study, nondiagnostic beyond the main pulmonary arteries. 2. No acute finding.  Electronically Signed   By: Monte Fantasia M.D.   On: 12/01/2017 02:51     Procedures  MDM   Presents with shortness of breath, assistant after albuterol treatments, Solu-Medrol, pain control and Ativan.  At shift change patient is pending CT angiogram to rule out pulmonary embolism.  05:00 Patient continues to have tachycardia and shortness of breath.  I have significant concern for pulmonary embolism and CT scan was unequivocal.  This was a poor study.  She has acidosis, hypokalemia, hypomagnesia.  Patient given fluids, potassium, magnesium.  The patient was discussed with Dr. Randal Buba who agrees with the treatment plan.  Patient discussed with Dr. Myna Hidalgo who will admit.  At this time he does not want to begin heparinizing patient for presumed pulmonary embolism.    SOB (shortness of breath)  Hypokalemia  Acidosis  Hypomagnesemia      Agapito Games 12/01/17 Hopkins, April, MD 12/01/17 [phone]

## 2017-12-01 NOTE — Progress Notes (Addendum)
PROGRESS NOTE                                                                                                                                                                                                             Patient Demographics:    Victoria [name], is a 37 y.o. female, DOB - 1980-07-21, JOA:[id number]  Admit date - 11/30/2017   Admitting Physician Vianne Bulls, MD  Outpatient Primary MD for the patient is Rita Ohara, MD  LOS - 0   Chief Complaint  Patient presents with  . Shortness of Breath       Brief Narrative     This is a no charge noticed patient admitted earlier today  37 y.o. female with medical history significant for asthma, type 2 diabetes mellitus, obesity with BMI 55, and OSA, now presenting to the emergency department with 1 day of shortness of breath.  She reports developing shortness of breath the morning of 11/30/2017, improved slightly with her albuterol inhaler, needed for asthma exacerbation ,troponins negative, EKG nonacute, CTA chest on admission was technically limited PE study, nondiagnostic beyond the main pulmonary arteries.   Subjective:    [name] today complaining of pleuritic chest pain, some dyspnea, denies nausea, vomiting headache or hemoptysis .   Assessment  & Plan :    Principal Problem:   Asthma exacerbation Active Problems:   Controlled type 2 diabetes mellitus with microalbuminuria, without long-term current use of insulin (HCC)   Essential hypertension, benign   OSA (obstructive sleep apnea)   Hypokalemia   Pleuritic chest pain with dyspnea -Stable differentials, possibly related to asthma exacerbation, as she does report some dyspnea and chest tightness, but I do her minimal wheezing, as her differentials include  - cardiac chest pain, but unlikely given normal EKG and troponins.  Obtain 2D echo and repeat troponins. -Mobility board for VTE, but CTA was overall poor quality, and  her chest pain is pleuritic, but no I have obtained D-dimers, does appear to be less likely, but I would like to confirm with VQ scan and venous Doppler. -Pericarditis, she does report some viral illness and her son recently, will obtain 2D echo, will give couple doses of colchicine, continue with IV steroids, will give another dose of IV Toradol.  Asthma exacerbation -Appears to be mild, minimal wheezing, continue with IV  Solu-Medrol, and PRN nebs  Hypokalemia -2.6 on admission, repleted, recheck in a.m.  Type 2 diabetes mellitus -A1c was 7.2 in March, continue to hold metformin during hospital stay as she received IV contrast, continue with insulin sliding scale during hospital stay  Hypertension-continue with Norvasc,  - hold hydrochlorothiazide until hypokalemia is corrected  Hypomagnesemia -Repleted      Code Status : Full  Family Communication  : None at bedside  Disposition Plan  : Home when stable  Consults  :  None  Procedures  : none  DVT Prophylaxis  :  [place] lovenox  Lab Results  Component Value Date   PLT 330 11/30/2017    Antibiotics  :   Anti-infectives (From admission, onward)   None        Objective:   Vitals:   12/01/17 0524 12/01/17 0536 12/01/17 0731 12/01/17 0732  BP: (!) 151/102 (!) 151/102 (!) 174/105 (!) 159/101  Pulse: (!) 102  99 97  Resp:   18   Temp:      TempSrc:      SpO2: 98%  100% 99%  Weight: 134.4 kg (296 lb 4.8 oz)     Height: 5\' 2"  (1.575 m)       Wt Readings from Last 3 Encounters:  12/01/17 134.4 kg (296 lb 4.8 oz)  08/20/17 (!) 136.4 kg (300 lb 12.8 oz)  08/19/17 (!) 136.5 kg (301 lb)     Intake/Output Summary (Last 24 hours) at 12/01/2017 1103 Last data filed at 12/01/2017 2703 Gross per 24 hour  Intake 315.29 ml  Output -  Net 315.29 ml     Physical Exam  Seen and examined in the presence with her nurse Mardene Celeste  Awake Alert, Oriented X 3, in mild discomfort secondary to chest pain and dyspnea  Supple  Neck,No JVD, Symmetrical Chest wall movement, Good air movement bilaterally, CTAB RRR,No Gallops,Rubs or new Murmurs, No Parasternal Heave, reproducable  chest pain on palpation +ve B.Sounds, Abd Soft, No tenderness, No organomegaly appriciated, No rebound - guarding or rigidity. No Cyanosis, Clubbing or edema, No new Rash or bruise      Data Review:    CBC Recent Labs  Lab 11/30/17 2005  WBC 7.0  HGB 11.6*  HCT 36.0  PLT 330  MCV 78.6  MCH 25.3*  MCHC 32.2  RDW 14.5    Chemistries  Recent Labs  Lab 11/30/17 2005 12/01/17 0404 12/01/17 0615  NA 133*  --  138  K 2.6*  --  3.4*  CL 99  --  105  CO2 18*  --  21*  GLUCOSE 285*  --  304*  BUN 8  --  8  CREATININE 0.75  --  0.76  CALCIUM 9.5  --  9.0  MG  --  1.6*  --    ------------------------------------------------------------------------------------------------------------------ No results for input(s): CHOL, HDL, LDLCALC, TRIG, CHOLHDL, LDLDIRECT in the last 72 hours.  Lab Results  Component Value Date   HGBA1C 7.2 08/20/2017   ------------------------------------------------------------------------------------------------------------------ No results for input(s): TSH, T4TOTAL, T3FREE, THYROIDAB in the last 72 hours.  Invalid input(s): FREET3 ------------------------------------------------------------------------------------------------------------------ No results for input(s): VITAMINB12, FOLATE, FERRITIN, TIBC, IRON, RETICCTPCT in the last 72 hours.  Coagulation profile No results for input(s): INR, PROTIME in the last 168 hours.  Recent Labs    12/01/17 0903  DDIMER <0.27    Cardiac Enzymes No results for input(s): CKMB, TROPONINI, MYOGLOBIN in the last 168 hours.  Invalid input(s): CK ------------------------------------------------------------------------------------------------------------------ No results found for:  BNP  Inpatient Medications  Scheduled Meds: . amLODipine  10 mg Oral  QHS  . colchicine  0.6 mg Oral Q3H  . enoxaparin (LOVENOX) injection  70 mg Subcutaneous Q24H  . insulin aspart  0-5 Units Subcutaneous QHS  . insulin aspart  0-9 Units Subcutaneous TID WC  . ketorolac  30 mg Intravenous Once  . methylPREDNISolone (SOLU-MEDROL) injection  60 mg Intravenous Q6H  . norethindrone  1 tablet Oral Daily  . sodium chloride flush  3 mL Intravenous Q12H  . sodium chloride flush  3 mL Intravenous Q12H   Continuous Infusions: . sodium chloride     PRN Meds:.sodium chloride, acetaminophen **OR** acetaminophen, albuterol, bisacodyl, fentaNYL (SUBLIMAZE) injection, hydrALAZINE, ibuprofen, LORazepam, ondansetron **OR** ondansetron (ZOFRAN) IV, senna-docusate, sodium chloride flush  Micro Results No results found for this or any previous visit (from the past 240 hour(s)).  Radiology Reports Dg Chest 2 View  Result Date: 11/30/2017 CLINICAL DATA:  Shortness of breath and chest tightness since this morning, labored breathing in triage, history asthma, type II diabetes mellitus EXAM: CHEST - 2 VIEW COMPARISON:  11/03/2016 FINDINGS: Normal heart size, mediastinal contours, and pulmonary vascularity. Lungs clear. No acute infiltrate, pleural effusion or pneumothorax. Bones unremarkable. IMPRESSION: No acute abnormalities. Electronically Signed   By: Lavonia Dana M.D.   On: 11/30/2017 20:38   Ct Angio Chest Pe W And/or Wo Contrast  Result Date: 12/01/2017 CLINICAL DATA:  Shortness of breath and chest pain. EXAM: CT ANGIOGRAPHY CHEST WITH CONTRAST TECHNIQUE: Multidetector CT imaging of the chest was performed using the standard protocol during bolus administration of intravenous contrast. Multiplanar CT image reconstructions and MIPs were obtained to evaluate the vascular anatomy. CONTRAST:  30mL ISOVUE-370 IOPAMIDOL (ISOVUE-370) INJECTION 76% COMPARISON:  06/15/2011 FINDINGS: Cardiovascular: Inadequate opacification of the pulmonary arteries to exclude pulmonary embolism beyond the  main pulmonary arteries. This is multifactorial including reduced dose and body habitus. Normal heart size. No pericardial effusion. Negative thoracic aorta. Mediastinum/Nodes: Negative for adenopathy. Residual thymus is unremarkable morphology, stable from 2013. Lungs/Pleura: Low volumes with interstitial crowding. There is no edema, consolidation, effusion, or pneumothorax. Upper Abdomen: Negative Musculoskeletal: Negative Review of the MIP images confirms the above findings. IMPRESSION: 1. Technically limited PE study, nondiagnostic beyond the main pulmonary arteries. 2. No acute finding. Electronically Signed   By: Monte Fantasia M.D.   On: 12/01/2017 02:51      Phillips Climes M.D on 12/01/2017 at 11:03 AM  Between 7am to 7pm - Pager - [phone]  After 7pm go to [email or web address] - password [facility]  [facility] -  Office  [phone]

## 2017-12-01 NOTE — ED Notes (Signed)
Patient ambulated in hallway, O2 Sats remained 100% but patient c/o chest pain. She describes the pain as "pressure".

## 2017-12-01 NOTE — Progress Notes (Signed)
  Echocardiogram 2D Echocardiogram has been performed.  Gaylen Venning L Androw 12/01/2017, 12:09 PM

## 2017-12-01 NOTE — Progress Notes (Signed)
Inpatient Diabetes Program Recommendations  AACE/ADA: New Consensus Statement on Inpatient Glycemic Control (2015)  Target Ranges:  Prepandial:   less than 140 mg/dL      Peak postprandial:   less than 180 mg/dL (1-2 hours)      Critically ill patients:  140 - 180 mg/dL   Lab Results  Component Value Date   GLUCAP 282 (H) 12/01/2017   HGBA1C 7.2 08/20/2017    Review of Glycemic Control Results for Victoria [name], Victoria [name] (MRN [id number]) as of 12/01/2017 12:34  Ref. Range 12/01/2017 07:31  Glucose-Capillary Latest Ref Range: 70 - 99 mg/dL 282 (H)   Diabetes history: Type 2 DM Outpatient Diabetes medications: Metformin 159 mg BID, Trulicity 4.58 mg Q/week Current orders for Inpatient glycemic control: Novolog 0-9 units TID, Novolog 0-5 units QHS, Solumedrol 60 mg Q6H  Inpatient Diabetes Program Recommendations:    In the setting of steroids, consider adding Lantus 20 units QD, as FSBS was 282 mg/dL. If post prandials continue to exceed 180 mg/dL, also consider Novolog 4 units TID (assuming patient is consuming >50%).  Thanks, Bronson Curb, MSN, RNC-OB Diabetes Coordinator [phone] (8a-5p)

## 2017-12-01 NOTE — Progress Notes (Signed)
*  PRELIMINARY RESULTS* Vascular Ultrasound Bilateral lower extremity venous duplex has been completed.  Preliminary findings: No evidence of deep vein thrombosis in the visualized veins or baker's cysts bilaterally.  Somewhat difficult exam due to patient body habitus.  Victoria [name] 12/01/2017, 11:35 AM

## 2017-12-01 NOTE — Progress Notes (Signed)
Pt places self on/off cpap when ready.  RT will monitor 
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
## 2017-12-01 NOTE — Progress Notes (Signed)
Pt has order for CPAP as needed. Pt requested CPAP for nap. RT placed pt on CPAP pressure of 10 and pt is tolerating well. RT will continue to monitor.

## 2017-12-02 ENCOUNTER — Emergency Department (HOSPITAL_COMMUNITY): Payer: 59

## 2017-12-02 ENCOUNTER — Observation Stay (HOSPITAL_BASED_OUTPATIENT_CLINIC_OR_DEPARTMENT_OTHER)
Admission: EM | Admit: 2017-12-02 | Discharge: 2017-12-04 | Disposition: A | Discharge status: Home / Self Care | Payer: 59 | Source: Home / Self Care | Attending: Emergency Medicine | Admitting: Emergency Medicine

## 2017-12-02 ENCOUNTER — Encounter (HOSPITAL_COMMUNITY): Payer: Self-pay | Admitting: Emergency Medicine

## 2017-12-02 ENCOUNTER — Other Ambulatory Visit: Payer: Self-pay

## 2017-12-02 DIAGNOSIS — R0781 Pleurodynia: Secondary | ICD-10-CM | POA: Diagnosis not present

## 2017-12-02 DIAGNOSIS — J45909 Unspecified asthma, uncomplicated: Secondary | ICD-10-CM | POA: Diagnosis present

## 2017-12-02 DIAGNOSIS — I300 Acute nonspecific idiopathic pericarditis: Secondary | ICD-10-CM | POA: Diagnosis not present

## 2017-12-02 DIAGNOSIS — J4521 Mild intermittent asthma with (acute) exacerbation: Secondary | ICD-10-CM | POA: Diagnosis not present

## 2017-12-02 DIAGNOSIS — I309 Acute pericarditis, unspecified: Secondary | ICD-10-CM

## 2017-12-02 DIAGNOSIS — R079 Chest pain, unspecified: Secondary | ICD-10-CM | POA: Diagnosis not present

## 2017-12-02 DIAGNOSIS — E872 Acidosis, unspecified: Secondary | ICD-10-CM | POA: Diagnosis present

## 2017-12-02 DIAGNOSIS — I319 Disease of pericardium, unspecified: Secondary | ICD-10-CM | POA: Diagnosis present

## 2017-12-02 DIAGNOSIS — G4733 Obstructive sleep apnea (adult) (pediatric): Secondary | ICD-10-CM | POA: Diagnosis present

## 2017-12-02 DIAGNOSIS — R809 Proteinuria, unspecified: Secondary | ICD-10-CM

## 2017-12-02 DIAGNOSIS — R0789 Other chest pain: Secondary | ICD-10-CM | POA: Diagnosis not present

## 2017-12-02 DIAGNOSIS — E1129 Type 2 diabetes mellitus with other diabetic kidney complication: Secondary | ICD-10-CM | POA: Diagnosis present

## 2017-12-02 DIAGNOSIS — I10 Essential (primary) hypertension: Secondary | ICD-10-CM | POA: Diagnosis present

## 2017-12-02 DIAGNOSIS — R0602 Shortness of breath: Secondary | ICD-10-CM | POA: Diagnosis not present

## 2017-12-02 DIAGNOSIS — Z6841 Body Mass Index (BMI) 40.0 and over, adult: Secondary | ICD-10-CM

## 2017-12-02 LAB — BASIC METABOLIC PANEL
ANION GAP: 8 (ref 5–15)
Anion gap: 12 (ref 5–15)
BUN: 12 mg/dL (ref 6–20)
BUN: 16 mg/dL (ref 6–20)
CHLORIDE: 106 mmol/L (ref 98–111)
CHLORIDE: 104 mmol/L (ref 98–111)
CO2: 23 mmol/L (ref 22–32)
CO2: 18 mmol/L — AB (ref 22–32)
CREATININE: 1.01 mg/dL — AB (ref 0.44–1.00)
Calcium: 8.5 mg/dL — ABNORMAL LOW (ref 8.9–10.3)
Calcium: 8.7 mg/dL — ABNORMAL LOW (ref 8.9–10.3)
Creatinine, Ser: 0.77 mg/dL (ref 0.44–1.00)
GFR calc Af Amer: >60 mL/min (ref >60)
GFR calc non Af Amer: >60 mL/min (ref >60)
GFR calc non Af Amer: >60 mL/min (ref >60)
Glucose, Bld: 308 mg/dL — ABNORMAL HIGH (ref 70–99)
Glucose, Bld: 389 mg/dL — ABNORMAL HIGH (ref 70–99)
POTASSIUM: 4 mmol/L (ref 3.5–5.1)
POTASSIUM: 4.1 mmol/L (ref 3.5–5.1)
SODIUM: 137 mmol/L (ref 135–145)
SODIUM: 134 mmol/L — AB (ref 135–145)

## 2017-12-02 LAB — C-REACTIVE PROTEIN: CRP: 2.3 mg/dL — AB (ref <1.0)

## 2017-12-02 LAB — CBC
HCT: 35.1 % — ABNORMAL LOW (ref 36.0–46.0)
HEMATOCRIT: 38.3 % (ref 36.0–46.0)
HEMOGLOBIN: 11.2 g/dL — AB (ref 12.0–15.0)
Hemoglobin: 12.2 g/dL (ref 12.0–15.0)
MCH: 25 pg — AB (ref 26.0–34.0)
MCH: 25.2 pg — AB (ref 26.0–34.0)
MCHC: 31.9 g/dL (ref 30.0–36.0)
MCHC: 31.9 g/dL (ref 30.0–36.0)
MCV: 78.3 fL (ref 78.0–100.0)
MCV: 79.1 fL (ref 78.0–100.0)
Platelets: 332 10*3/uL (ref 150–400)
Platelets: 395 10*3/uL (ref 150–400)
RBC: 4.48 MIL/uL (ref 3.87–5.11)
RBC: 4.84 MIL/uL (ref 3.87–5.11)
RDW: 14.6 % (ref 11.5–15.5)
RDW: 14.9 % (ref 11.5–15.5)
WBC: 10.4 10*3/uL (ref 4.0–10.5)
WBC: 13.3 10*3/uL — AB (ref 4.0–10.5)

## 2017-12-02 LAB — SEDIMENTATION RATE: SED RATE: 37 mm/h — AB (ref 0–22)

## 2017-12-02 LAB — URINE CULTURE

## 2017-12-02 LAB — I-STAT BETA HCG BLOOD, ED (MC, WL, AP ONLY)

## 2017-12-02 LAB — GLUCOSE, CAPILLARY
GLUCOSE-CAPILLARY: 290 mg/dL — AB (ref 70–99)
GLUCOSE-CAPILLARY: 321 mg/dL — AB (ref 70–99)

## 2017-12-02 LAB — I-STAT TROPONIN, ED: Troponin i, poc: 0.01 ng/mL (ref 0.00–0.08)

## 2017-12-02 MED ORDER — ALBUTEROL SULFATE (2.5 MG/3ML) 0.083% IN NEBU
INHALATION_SOLUTION | RESPIRATORY_TRACT | Status: AC
  Administered 2017-12-02: 5 mg via RESPIRATORY_TRACT
  Filled 2017-12-02: qty 6

## 2017-12-02 MED ORDER — SODIUM CHLORIDE 0.9 % IV BOLUS
500.0000 mL | Freq: Once | INTRAVENOUS | Status: AC
  Administered 2017-12-02: 500 mL via INTRAVENOUS

## 2017-12-02 MED ORDER — IBUPROFEN 600 MG PO TABS
600.0000 mg | ORAL_TABLET | Freq: Three times a day (TID) | ORAL | Status: DC
  Administered 2017-12-02: 600 mg via ORAL
  Filled 2017-12-02: qty 1

## 2017-12-02 MED ORDER — METFORMIN HCL 500 MG PO TABS: 500.0000 mg | ORAL_TABLET | Freq: Two times a day (BID) | ORAL | 3 refills | Status: DC

## 2017-12-02 MED ORDER — ALBUTEROL SULFATE (2.5 MG/3ML) 0.083% IN NEBU
5.0000 mg | INHALATION_SOLUTION | Freq: Once | RESPIRATORY_TRACT | Status: AC
  Administered 2017-12-02: 5 mg via RESPIRATORY_TRACT

## 2017-12-02 MED ORDER — INSULIN GLARGINE 100 UNIT/ML ~~LOC~~ SOLN
15.0000 [IU] | Freq: Every day | SUBCUTANEOUS | Status: DC
  Administered 2017-12-02: 15 [IU] via SUBCUTANEOUS
  Filled 2017-12-02: qty 0.15

## 2017-12-02 MED ORDER — IBUPROFEN 400 MG PO TABS: 400.0000 mg | ORAL_TABLET | Freq: Two times a day (BID) | ORAL | 0 refills | Status: DC

## 2017-12-02 MED ORDER — KETOROLAC TROMETHAMINE 30 MG/ML IJ SOLN
15.0000 mg | Freq: Once | INTRAMUSCULAR | Status: AC
  Administered 2017-12-02: 15 mg via INTRAVENOUS
  Filled 2017-12-02: qty 1

## 2017-12-02 MED ORDER — IBUPROFEN 200 MG PO TABS: 400.0000 mg | ORAL_TABLET | Freq: Two times a day (BID) | ORAL | Status: DC

## 2017-12-02 MED ORDER — COLCHICINE 0.6 MG PO TABS: 0.6000 mg | ORAL_TABLET | Freq: Two times a day (BID) | ORAL | 0 refills | Status: DC

## 2017-12-02 MED ORDER — PANTOPRAZOLE SODIUM 40 MG PO TBEC: 40.0000 mg | DELAYED_RELEASE_TABLET | Freq: Every day | ORAL | 1 refills | Status: DC

## 2017-12-02 MED ORDER — INSULIN ASPART 100 UNIT/ML ~~LOC~~ SOLN
4.0000 [IU] | Freq: Three times a day (TID) | SUBCUTANEOUS | Status: DC
  Administered 2017-12-02: 4 [IU] via SUBCUTANEOUS

## 2017-12-02 MED ORDER — COLCHICINE 0.6 MG PO TABS
0.6000 mg | ORAL_TABLET | Freq: Two times a day (BID) | ORAL | Status: DC
  Administered 2017-12-02: 0.6 mg via ORAL
  Filled 2017-12-02: qty 1

## 2017-12-02 MED FILL — COLCHICINE 0.6 MG TABS: 0.6 | 30 days supply | Qty: 60 | Fill #0

## 2017-12-02 MED FILL — IBUPROFEN 400 MG TABS: 400 | 21 days supply | Qty: 42 | Fill #0

## 2017-12-02 NOTE — Progress Notes (Signed)
Inpatient Diabetes Program Recommendations  AACE/ADA: New Consensus Statement on Inpatient Glycemic Control (2015)  Target Ranges:  Prepandial:   less than 140 mg/dL      Peak postprandial:   less than 180 mg/dL (1-2 hours)      Critically ill patients:  140 - 180 mg/dL   Lab Results  Component Value Date   GLUCAP 321 (H) 12/02/2017   HGBA1C 7.2 08/20/2017    Review of Glycemic Control Results for Victoria [name], Victoria [name] (MRN [id number]) as of 12/02/2017 12:34  Ref. Range 12/01/2017 16:29 12/01/2017 21:28 12/02/2017 07:36 12/02/2017 11:15  Glucose-Capillary Latest Ref Range: 70 - 99 mg/dL 231 (H) 237 (H) 290 (H) 321 (H)   Diabetes history: Type 2 DM Outpatient Diabetes medications: Metformin 861 mg BID, Trulicity 6.83 mg Q/week Current orders for Inpatient glycemic control: Novolog 0-9 units TID, Novolog 0-5 units QHS, Novolog 4 units TID, Lantus 15 units QD, Solumedrol 60 mg Q6H  Inpatient Diabetes Program Recommendations:    Noted changes this AM, in agreement.   Also, last A1C was from March, 2019, consider repeating A1C?  Thanks, Bronson Curb, MSN, RNC-OB Diabetes Coordinator [phone] (8a-5p)

## 2017-12-02 NOTE — ED Provider Notes (Signed)
Patient placed in Quick Look pathway, seen and evaluated   Chief Complaint: cp, sob  HPI:   Report cp/sob for the past 2-3 days.  Admit to hospital and diagnosed with pericarditis.  D/c today but report worsening sob and pleuritic cp  ROS: no fever, no leg swelling (one)  Physical Exam:   Gen: No distress  Neuro: Awake and Alert  Skin: Warm    Focused Exam: tachypnea, no W/R/R.   Initiation of care has begun. The patient has been counseled on the process, plan, and necessity for staying for the completion/evaluation, and the remainder of the medical screening examination    Victoria [name] 12/02/17 2012    Noemi Chapel, MD 12/03/17 2231
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
## 2017-12-02 NOTE — ED Triage Notes (Signed)
Pt reports being d/c earlier today for hospitalization of asthma. Pt reporting 7/10 central cp with no radiation. Pt has sob, nausea and cough with productive green mucous. No wheezing noted in triage, some rhonchi breath sounds. Pt reports trying inhaler at home with no relief.

## 2017-12-02 NOTE — Discharge Instructions (Signed)
Follow with Primary MD Victoria Ohara, MD in 7 days   Get CBC, CMP, checked  by Primary MD next visit.    Activity: As tolerated with Full fall precautions use walker/cane & assistance as needed   Disposition Home    Diet: Heart Healthy  , with feeding assistance and aspiration precautions.  For Heart failure patients - Check your Weight same time everyday, if you gain over 2 pounds, or you develop in leg swelling, experience more shortness of breath or chest pain, call your Primary MD immediately. Follow Cardiac Low Salt Diet and 1.5 lit/day fluid restriction.   On your next visit with your primary care physician please Get Medicines reviewed and adjusted.   Please request your Prim.MD to go over all Hospital Tests and Procedure/Radiological results at the follow up, please get all Hospital records sent to your Prim MD by signing hospital release before you go home.   If you experience worsening of your admission symptoms, develop shortness of breath, life threatening emergency, suicidal or homicidal thoughts you must seek medical attention immediately by calling 911 or calling your MD immediately  if symptoms less severe.  You Must read complete instructions/literature along with all the possible adverse reactions/side effects for all the Medicines you take and that have been prescribed to you. Take any new Medicines after you have completely understood and accpet all the possible adverse reactions/side effects.   Do not drive, operating heavy machinery, perform activities at heights, swimming or participation in water activities or provide baby sitting services if your were admitted for syncope or siezures until you have seen by Primary MD or a Neurologist and advised to do so again.  Do not drive when taking Pain medications.    Do not take more than prescribed Pain, Sleep and Anxiety Medications  Special Instructions: If you have smoked or chewed Tobacco  in the last 2 yrs please  stop smoking, stop any regular Alcohol  and or any Recreational drug use.  Wear Seat belts while driving.   Please note  You were cared for by a hospitalist during your hospital stay. If you have any questions about your discharge medications or the care you received while you were in the hospital after you are discharged, you can call the unit and asked to speak with the hospitalist on call if the hospitalist that took care of you is not available. Once you are discharged, your primary care physician will handle any further medical issues. Please note that NO REFILLS for any discharge medications will be authorized once you are discharged, as it is imperative that you return to your primary care physician (or establish a relationship with a primary care physician if you do not have one) for your aftercare needs so that they can reassess your need for medications and monitor your lab values.
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
## 2017-12-02 NOTE — Progress Notes (Signed)
Patient ambulated 253ft this am. Oxygen saturation remained 100% for the duration of the walk but patient had some SOB throughout and after.

## 2017-12-03 ENCOUNTER — Encounter (HOSPITAL_COMMUNITY): Payer: Self-pay

## 2017-12-03 ENCOUNTER — Other Ambulatory Visit: Payer: Self-pay

## 2017-12-03 DIAGNOSIS — R0602 Shortness of breath: Secondary | ICD-10-CM | POA: Diagnosis present

## 2017-12-03 DIAGNOSIS — Z6841 Body Mass Index (BMI) 40.0 and over, adult: Secondary | ICD-10-CM | POA: Diagnosis not present

## 2017-12-03 DIAGNOSIS — I10 Essential (primary) hypertension: Secondary | ICD-10-CM | POA: Diagnosis not present

## 2017-12-03 DIAGNOSIS — R0789 Other chest pain: Secondary | ICD-10-CM | POA: Diagnosis not present

## 2017-12-03 DIAGNOSIS — R809 Proteinuria, unspecified: Secondary | ICD-10-CM

## 2017-12-03 DIAGNOSIS — E1129 Type 2 diabetes mellitus with other diabetic kidney complication: Secondary | ICD-10-CM | POA: Diagnosis not present

## 2017-12-03 DIAGNOSIS — R0781 Pleurodynia: Secondary | ICD-10-CM | POA: Diagnosis not present

## 2017-12-03 DIAGNOSIS — G4733 Obstructive sleep apnea (adult) (pediatric): Secondary | ICD-10-CM | POA: Diagnosis not present

## 2017-12-03 DIAGNOSIS — I309 Acute pericarditis, unspecified: Secondary | ICD-10-CM | POA: Diagnosis not present

## 2017-12-03 DIAGNOSIS — E872 Acidosis, unspecified: Secondary | ICD-10-CM | POA: Diagnosis present

## 2017-12-03 DIAGNOSIS — I319 Disease of pericardium, unspecified: Secondary | ICD-10-CM | POA: Diagnosis present

## 2017-12-03 DIAGNOSIS — J45909 Unspecified asthma, uncomplicated: Secondary | ICD-10-CM | POA: Diagnosis not present

## 2017-12-03 DIAGNOSIS — J4520 Mild intermittent asthma, uncomplicated: Secondary | ICD-10-CM | POA: Diagnosis not present

## 2017-12-03 LAB — GLUCOSE, CAPILLARY
GLUCOSE-CAPILLARY: 231 mg/dL — AB (ref 70–99)
Glucose-Capillary: 250 mg/dL — ABNORMAL HIGH (ref 70–99)
Glucose-Capillary: 224 mg/dL — ABNORMAL HIGH (ref 70–99)
Glucose-Capillary: 202 mg/dL — ABNORMAL HIGH (ref 70–99)

## 2017-12-03 LAB — BASIC METABOLIC PANEL
ANION GAP: 10 (ref 5–15)
BUN: 15 mg/dL (ref 6–20)
CALCIUM: 7.6 mg/dL — AB (ref 8.9–10.3)
CO2: 22 mmol/L (ref 22–32)
Chloride: 107 mmol/L (ref 98–111)
Creatinine, Ser: 0.86 mg/dL (ref 0.44–1.00)
GFR calc non Af Amer: >60 mL/min (ref >60)
Glucose, Bld: 286 mg/dL — ABNORMAL HIGH (ref 70–99)
Potassium: 3.7 mmol/L (ref 3.5–5.1)
SODIUM: 139 mmol/L (ref 135–145)

## 2017-12-03 LAB — RAPID URINE DRUG SCREEN, HOSP PERFORMED
AMPHETAMINES: NOT DETECTED
Benzodiazepines: NOT DETECTED
Cocaine: NOT DETECTED
OPIATES: NOT DETECTED
Tetrahydrocannabinol: NOT DETECTED

## 2017-12-03 LAB — I-STAT VENOUS BLOOD GAS, ED
Acid-base deficit: 4 mmol/L — ABNORMAL HIGH (ref 0.0–2.0)
Bicarbonate: 20.1 mmol/L (ref 20.0–28.0)
O2 Saturation: 61 %
TCO2: 21 mmol/L — ABNORMAL LOW (ref 22–32)
pCO2, Ven: 32.4 mmHg — ABNORMAL LOW (ref 44.0–60.0)
pH, Ven: 7.4 (ref 7.250–7.430)
pO2, Ven: 31 mmHg — CL (ref 32.0–45.0)

## 2017-12-03 LAB — CBC
HCT: 33.1 % — ABNORMAL LOW (ref 36.0–46.0)
HEMOGLOBIN: 10.6 g/dL — AB (ref 12.0–15.0)
MCH: 25.2 pg — AB (ref 26.0–34.0)
MCHC: 32 g/dL (ref 30.0–36.0)
MCV: 78.8 fL (ref 78.0–100.0)
Platelets: 333 10*3/uL (ref 150–400)
RBC: 4.2 MIL/uL (ref 3.87–5.11)
RDW: 15.1 % (ref 11.5–15.5)
WBC: 11.1 10*3/uL — ABNORMAL HIGH (ref 4.0–10.5)

## 2017-12-03 LAB — I-STAT CG4 LACTIC ACID, ED: Lactic Acid, Venous: 4.73 mmol/L (ref 0.5–1.9)

## 2017-12-03 LAB — BRAIN NATRIURETIC PEPTIDE: B NATRIURETIC PEPTIDE 5: 45.1 pg/mL (ref 0.0–100.0)

## 2017-12-03 LAB — I-STAT TROPONIN, ED: Troponin i, poc: 0 ng/mL (ref 0.00–0.08)

## 2017-12-03 LAB — TROPONIN I
Troponin I: <0.03 ng/mL (ref <0.03)
Troponin I: <0.03 ng/mL (ref <0.03)

## 2017-12-03 LAB — CBG MONITORING, ED: GLUCOSE-CAPILLARY: 345 mg/dL — AB (ref 70–99)

## 2017-12-03 LAB — LACTIC ACID, PLASMA
LACTIC ACID, VENOUS: 3.9 mmol/L — AB (ref 0.5–1.9)
Lactic Acid, Venous: 2.4 mmol/L (ref 0.5–1.9)
Lactic Acid, Venous: 2.4 mmol/L (ref 0.5–1.9)

## 2017-12-03 LAB — PROCALCITONIN

## 2017-12-03 MED ORDER — INSULIN GLARGINE 100 UNIT/ML ~~LOC~~ SOLN
15.0000 [IU] | Freq: Every day | SUBCUTANEOUS | Status: DC
  Administered 2017-12-03 – 2017-12-04 (×2): 15 [IU] via SUBCUTANEOUS
  Filled 2017-12-03 (×2): qty 0.15

## 2017-12-03 MED ORDER — IPRATROPIUM-ALBUTEROL 0.5-2.5 (3) MG/3ML IN SOLN
3.0000 mL | Freq: Four times a day (QID) | RESPIRATORY_TRACT | Status: DC
  Administered 2017-12-03 (×2): 3 mL via RESPIRATORY_TRACT
  Filled 2017-12-03: qty 3

## 2017-12-03 MED ORDER — ACETAMINOPHEN 650 MG RE SUPP: 650.0000 mg | Freq: Four times a day (QID) | RECTAL | Status: DC | PRN

## 2017-12-03 MED ORDER — SODIUM CHLORIDE 0.9 % IV BOLUS
1000.0000 mL | Freq: Once | INTRAVENOUS | Status: AC
  Administered 2017-12-03: 1000 mL via INTRAVENOUS

## 2017-12-03 MED ORDER — INSULIN ASPART 100 UNIT/ML ~~LOC~~ SOLN
4.0000 [IU] | Freq: Three times a day (TID) | SUBCUTANEOUS | Status: DC
  Administered 2017-12-03 – 2017-12-04 (×3): 4 [IU] via SUBCUTANEOUS

## 2017-12-03 MED ORDER — LORAZEPAM 2 MG/ML IJ SOLN
0.5000 mg | Freq: Once | INTRAMUSCULAR | Status: DC
  Filled 2017-12-03: qty 1

## 2017-12-03 MED ORDER — ENOXAPARIN SODIUM 40 MG/0.4ML ~~LOC~~ SOLN
40.0000 mg | SUBCUTANEOUS | Status: DC
  Administered 2017-12-03 – 2017-12-04 (×2): 40 mg via SUBCUTANEOUS
  Filled 2017-12-03 (×3): qty 0.4

## 2017-12-03 MED ORDER — ACETAMINOPHEN 325 MG PO TABS
650.0000 mg | ORAL_TABLET | Freq: Four times a day (QID) | ORAL | Status: DC | PRN
Start: 2017-12-03 — End: 2017-12-04

## 2017-12-03 MED ORDER — DM-GUAIFENESIN ER 30-600 MG PO TB12
1.0000 | ORAL_TABLET | Freq: Two times a day (BID) | ORAL | Status: DC | PRN
Start: 2017-12-03 — End: 2017-12-04

## 2017-12-03 MED ORDER — IBUPROFEN 400 MG PO TABS
400.0000 mg | ORAL_TABLET | Freq: Two times a day (BID) | ORAL | Status: DC
  Administered 2017-12-03 – 2017-12-04 (×5): 400 mg via ORAL
  Filled 2017-12-03 (×3): qty 1
  Filled 2017-12-03: qty 2
  Filled 2017-12-03: qty 1
  Filled 2017-12-03 (×2): qty 2
  Filled 2017-12-03: qty 1

## 2017-12-03 MED ORDER — IPRATROPIUM-ALBUTEROL 0.5-2.5 (3) MG/3ML IN SOLN
3.0000 mL | RESPIRATORY_TRACT | Status: DC
  Administered 2017-12-03: 3 mL via RESPIRATORY_TRACT
  Filled 2017-12-03: qty 3

## 2017-12-03 MED ORDER — COLCHICINE 0.6 MG PO TABS
0.6000 mg | ORAL_TABLET | Freq: Two times a day (BID) | ORAL | Status: DC
  Administered 2017-12-03 – 2017-12-04 (×4): 0.6 mg via ORAL
  Filled 2017-12-03 (×4): qty 1

## 2017-12-03 MED ORDER — ALBUTEROL SULFATE (2.5 MG/3ML) 0.083% IN NEBU
2.5000 mg | INHALATION_SOLUTION | RESPIRATORY_TRACT | Status: DC | PRN
  Administered 2017-12-03: 2.5 mg via RESPIRATORY_TRACT
  Filled 2017-12-03: qty 3

## 2017-12-03 MED ORDER — AZITHROMYCIN 250 MG PO TABS
500.0000 mg | ORAL_TABLET | Freq: Every day | ORAL | Status: AC
  Administered 2017-12-03: 500 mg via ORAL
  Filled 2017-12-03: qty 2

## 2017-12-03 MED ORDER — AZITHROMYCIN 250 MG PO TABS
250.0000 mg | ORAL_TABLET | Freq: Every day | ORAL | Status: DC
  Administered 2017-12-04: 250 mg via ORAL
  Filled 2017-12-03: qty 1

## 2017-12-03 MED ORDER — AMLODIPINE BESYLATE 5 MG PO TABS
10.0000 mg | ORAL_TABLET | Freq: Every day | ORAL | Status: DC
  Administered 2017-12-03: 10 mg via ORAL
  Filled 2017-12-03: qty 2

## 2017-12-03 MED ORDER — ONDANSETRON HCL 4 MG/2ML IJ SOLN: 4.0000 mg | Freq: Four times a day (QID) | INTRAMUSCULAR | Status: DC | PRN

## 2017-12-03 MED ORDER — ZOLPIDEM TARTRATE 5 MG PO TABS: 5.0000 mg | ORAL_TABLET | Freq: Every evening | ORAL | Status: DC | PRN

## 2017-12-03 MED ORDER — PANTOPRAZOLE SODIUM 40 MG PO TBEC
40.0000 mg | DELAYED_RELEASE_TABLET | Freq: Every day | ORAL | Status: DC
  Administered 2017-12-03 – 2017-12-04 (×2): 40 mg via ORAL
  Filled 2017-12-03 (×2): qty 1

## 2017-12-03 MED ORDER — FENTANYL CITRATE (PF) 100 MCG/2ML IJ SOLN
25.0000 ug | INTRAMUSCULAR | Status: DC | PRN
  Administered 2017-12-03: 25 ug via INTRAVENOUS
  Filled 2017-12-03: qty 2

## 2017-12-03 MED ORDER — POLYETHYLENE GLYCOL 3350 17 G PO PACK: 17.0000 g | PACK | Freq: Every day | ORAL | Status: DC | PRN

## 2017-12-03 MED ORDER — NORETHINDRONE 0.35 MG PO TABS: 1.0000 | ORAL_TABLET | Freq: Every day | ORAL | Status: DC

## 2017-12-03 MED ORDER — INSULIN ASPART 100 UNIT/ML ~~LOC~~ SOLN
0.0000 [IU] | Freq: Three times a day (TID) | SUBCUTANEOUS | Status: DC
  Administered 2017-12-03 (×3): 3 [IU] via SUBCUTANEOUS
  Administered 2017-12-04: 2 [IU] via SUBCUTANEOUS
  Administered 2017-12-04: 1 [IU] via SUBCUTANEOUS
  Filled 2017-12-03: qty 1

## 2017-12-03 MED ORDER — INSULIN ASPART 100 UNIT/ML ~~LOC~~ SOLN
0.0000 [IU] | Freq: Every day | SUBCUTANEOUS | Status: DC
  Administered 2017-12-03: 4 [IU] via SUBCUTANEOUS

## 2017-12-03 MED ORDER — ONDANSETRON HCL 4 MG PO TABS: 4.0000 mg | ORAL_TABLET | Freq: Four times a day (QID) | ORAL | Status: DC | PRN

## 2017-12-03 MED ORDER — IPRATROPIUM-ALBUTEROL 0.5-2.5 (3) MG/3ML IN SOLN
3.0000 mL | Freq: Two times a day (BID) | RESPIRATORY_TRACT | Status: DC
  Administered 2017-12-03 – 2017-12-04 (×2): 3 mL via RESPIRATORY_TRACT
  Filled 2017-12-03 (×2): qty 3

## 2017-12-03 MED ORDER — NITROGLYCERIN 0.4 MG SL SUBL: 0.4000 mg | SUBLINGUAL_TABLET | SUBLINGUAL | Status: DC | PRN

## 2017-12-03 MED ORDER — HYDRALAZINE HCL 20 MG/ML IJ SOLN: 5.0000 mg | INTRAMUSCULAR | Status: DC | PRN

## 2017-12-03 MED ORDER — ADULT MULTIVITAMIN W/MINERALS CH
1.0000 | ORAL_TABLET | Freq: Every day | ORAL | Status: DC
  Administered 2017-12-03 – 2017-12-04 (×2): 1 via ORAL
  Filled 2017-12-03 (×2): qty 1
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
## 2017-12-03 NOTE — Progress Notes (Signed)
Inpatient Diabetes Program Recommendations  AACE/ADA: New Consensus Statement on Inpatient Glycemic Control (2015)  Target Ranges:  Prepandial:   less than 140 mg/dL      Peak postprandial:   less than 180 mg/dL (1-2 hours)      Critically ill patients:  140 - 180 mg/dL   Lab Results  Component Value Date   GLUCAP 345 (H) 12/03/2017   HGBA1C 7.2 08/20/2017    Review of Glycemic Control Results for Victoria [name], Victoria [name] (MRN [id number]) as of 12/03/2017 12:32  Ref. Range 12/01/2017 21:28 12/02/2017 07:36 12/02/2017 11:15 12/03/2017 02:16  Glucose-Capillary Latest Ref Range: 70 - 99 mg/dL 237 (H) 290 (H) 321 (H) 345 (H)    Diabetes history:Type 2 DM Outpatient Diabetes medications:Metformin 909 mg BID, Trulicity 3.11 mg Q/week Current orders for Inpatient glycemic control:Novolog 0-9 units TID, Novolog 0-5 units QHS, Novolog 4 units TID, Lantus 15 units QD  Inpatient Diabetes Program Recommendations:  Last A1C was from March, 2019, consider repeating A1C?  Consider previous inpatient regimen from 12/02/17: Novolog 4 units TID, Lantus 15 units QHS.  Thanks, Bronson Curb, MSN, RNC-OB Diabetes Coordinator [phone] (8a-5p)

## 2017-12-03 NOTE — Progress Notes (Addendum)
Patient seen and examined  37 year old female with a history of hypertension, diabetes mellitus, asthma, GERD, OSA, morbid obesity, who presents with shortness breath and chest pain.hospitalized 7/7-7/9 due to shortness of breath and chest pain, ruled out for PE, DVT, thought to have acute pericarditis and started on colchicine and ibuprofen, who presents to the ED because of persistent shortness of breath, cough, sputum production, lactic acidosis, now admitted for observation     SOB (shortness of breath): previously thought to be secondary to obstructive sleep apnea/asthma/pericarditis, no oxygen desaturation.   Pt has no wheezing or rhonchi on auscultation, does not seem to have asthma exacerbation.  Patient had negative work-up for PE in previous admission. She reports sore throat and productive cough with colored sputum production,started on azithromycin continue observation -continueCPAP -Nebulizers: scheduled Duoneb and prn albuterol -Mucinex for cough  -Follow up blood culture x2, sputum culture, respiratory virus panel -Nasal cannula oxygen as neededto maintain O2 saturation 92% or greater -check rapid strep/respiratory panel  Asthma: no wheezing or rhonchi on auscultation, does not seem to have asthma exacerbation. -Bronchodilators as above  OSA: -CPAP  Lactic acid acidosis: Lactic acid of 4.73.  Patient has leukocytosis with WBC 13.3, but no fever. Leukocytosis is likely due to recent steroid use. Possibly due to tachypnea and hard respiratory muscle working. Metformin use may have contributed partially. Will start CPAP due decrease respiratory muscle workload Negative Procalcitonin , doubt sepsis -IVF:3.5 L of NS bolus in ED  Controlled type 2 diabetes mellitus with microalbuminuria, without long-term current use of insulin ([place]): Last A1c 7.2, fairly controled. Patient is taking Trulicity and metformin at home -SSI, resume Lantus 15 units, NovoLog  Essential  hypertension, benign: -IV hydralazine as needed -Continue amlodipine -Hold HCTZ due to lactic acid acidosis in the need of IV fluid.  Chest pain: possible due to pericarditis.  -contine colchicine and a ibuprofen -trop x 3 has been negative -try prn NTN -prn fentanyl (patient is allergic to morphine and Dilaudid)

## 2017-12-03 NOTE — Progress Notes (Signed)
Patient stated that she doesn't need any assistance applying the CPAP.  RT filled water chamber and advised patient if she needed any assistance to give respiratory a call.    12/03/17 2359  BiPAP/CPAP/SIPAP  BiPAP/CPAP/SIPAP Pt Type Adult  Mask Type Full face mask  EPAP 4 cmH2O  BiPAP/CPAP/SIPAP CPAP  Patient Home Equipment No  Auto Titrate No

## 2017-12-04 ENCOUNTER — Encounter: Payer: Self-pay | Admitting: Cardiology

## 2017-12-04 DIAGNOSIS — I309 Acute pericarditis, unspecified: Secondary | ICD-10-CM | POA: Diagnosis not present

## 2017-12-04 DIAGNOSIS — J4520 Mild intermittent asthma, uncomplicated: Secondary | ICD-10-CM

## 2017-12-04 DIAGNOSIS — I10 Essential (primary) hypertension: Secondary | ICD-10-CM | POA: Diagnosis not present

## 2017-12-04 DIAGNOSIS — R0781 Pleurodynia: Secondary | ICD-10-CM | POA: Diagnosis not present

## 2017-12-04 DIAGNOSIS — J45909 Unspecified asthma, uncomplicated: Secondary | ICD-10-CM | POA: Diagnosis not present

## 2017-12-04 DIAGNOSIS — R0602 Shortness of breath: Secondary | ICD-10-CM | POA: Diagnosis not present

## 2017-12-04 LAB — COMPREHENSIVE METABOLIC PANEL
ALBUMIN: 3 g/dL — AB (ref 3.5–5.0)
ALT: 16 U/L (ref 0–44)
AST: 14 U/L — ABNORMAL LOW (ref 15–41)
Alkaline Phosphatase: 47 U/L (ref 38–126)
Anion gap: 9 (ref 5–15)
BUN: 10 mg/dL (ref 6–20)
CO2: 24 mmol/L (ref 22–32)
CREATININE: 0.63 mg/dL (ref 0.44–1.00)
Calcium: 8 mg/dL — ABNORMAL LOW (ref 8.9–10.3)
Chloride: 106 mmol/L (ref 98–111)
GFR calc non Af Amer: >60 mL/min (ref >60)
Glucose, Bld: 159 mg/dL — ABNORMAL HIGH (ref 70–99)
Potassium: 3.1 mmol/L — ABNORMAL LOW (ref 3.5–5.1)
Sodium: 139 mmol/L (ref 135–145)
Total Bilirubin: 0.3 mg/dL (ref 0.3–1.2)
Total Protein: 5.9 g/dL — ABNORMAL LOW (ref 6.5–8.1)

## 2017-12-04 LAB — RESPIRATORY PANEL BY PCR
ADENOVIRUS-RVPPCR: NOT DETECTED
BORDETELLA PERTUSSIS-RVPCR: NOT DETECTED
CHLAMYDOPHILA PNEUMONIAE-RVPPCR: NOT DETECTED
CORONAVIRUS 229E-RVPPCR: NOT DETECTED
CORONAVIRUS HKU1-RVPPCR: NOT DETECTED
Coronavirus NL63: NOT DETECTED
Coronavirus OC43: NOT DETECTED
Influenza A: NOT DETECTED
Influenza B: NOT DETECTED
Metapneumovirus: NOT DETECTED
Mycoplasma pneumoniae: NOT DETECTED
Parainfluenza Virus 1: NOT DETECTED
Parainfluenza Virus 2: NOT DETECTED
Parainfluenza Virus 3: NOT DETECTED
Parainfluenza Virus 4: NOT DETECTED
RHINOVIRUS / ENTEROVIRUS - RVPPCR: NOT DETECTED
Respiratory Syncytial Virus: NOT DETECTED

## 2017-12-04 LAB — LACTIC ACID, PLASMA: Lactic Acid, Venous: 1.7 mmol/L (ref 0.5–1.9)

## 2017-12-04 LAB — CBC
HCT: 32.5 % — ABNORMAL LOW (ref 36.0–46.0)
Hemoglobin: 10.6 g/dL — ABNORMAL LOW (ref 12.0–15.0)
MCH: 25.9 pg — AB (ref 26.0–34.0)
MCHC: 32.6 g/dL (ref 30.0–36.0)
MCV: 79.3 fL (ref 78.0–100.0)
PLATELETS: 254 10*3/uL (ref 150–400)
RBC: 4.1 MIL/uL (ref 3.87–5.11)
RDW: 14.7 % (ref 11.5–15.5)
WBC: 8.8 10*3/uL (ref 4.0–10.5)

## 2017-12-04 LAB — GLUCOSE, CAPILLARY
Glucose-Capillary: 146 mg/dL — ABNORMAL HIGH (ref 70–99)
Glucose-Capillary: 157 mg/dL — ABNORMAL HIGH (ref 70–99)

## 2017-12-04 MED ORDER — SODIUM CHLORIDE 0.9 % IV BOLUS
500.0000 mL | Freq: Once | INTRAVENOUS | Status: AC
  Administered 2017-12-04: 500 mL via INTRAVENOUS

## 2017-12-04 MED ORDER — FUROSEMIDE 40 MG PO TABS: 40.0000 mg | ORAL_TABLET | Freq: Every day | ORAL | 11 refills | Status: DC

## 2017-12-04 MED ORDER — DM-GUAIFENESIN ER 30-600 MG PO TB12: 1.0000 | ORAL_TABLET | Freq: Two times a day (BID) | ORAL | 0 refills | Status: DC | PRN

## 2017-12-04 MED ORDER — POTASSIUM CHLORIDE CRYS ER 20 MEQ PO TBCR
60.0000 meq | EXTENDED_RELEASE_TABLET | Freq: Once | ORAL | Status: AC
  Administered 2017-12-04: 60 meq via ORAL
  Filled 2017-12-04: qty 3

## 2017-12-04 MED ORDER — AZITHROMYCIN 500 MG PO TABS: 500.0000 mg | ORAL_TABLET | Freq: Every day | ORAL | 0 refills | Status: DC

## 2017-12-04 MED FILL — AZITHROMYCIN 500 MG TABLET: 500 | 3 days supply | Qty: 3 | Fill #0

## 2017-12-04 MED FILL — FUROSEMIDE 40 MG TAB: 40 | 30 days supply | Qty: 30 | Fill #0

## 2017-12-04 NOTE — Discharge Summary (Signed)
Physician Discharge Summary  Victoria [name] MRN: [id number] DOB/AGE: 1980-06-21 37 y.o.  PCP: Rita Ohara, MD   Admit date: 12/02/2017 Discharge date: 12/04/2017  Discharge Diagnoses:    Principal Problem:   SOB (shortness of breath) Active Problems:   Controlled type 2 diabetes mellitus with microalbuminuria, without long-term current use of insulin (HCC)   Essential hypertension, benign   OSA (obstructive sleep apnea)   Morbid obesity with BMI of 50.0-59.9, adult (HCC)   Asthma   Lactic acid acidosis   Pericarditis    Follow-up recommendations Follow-up with PCP in 3-5 days , including all  additional recommended appointments as below Follow-up CBC, CMP in 3-5 days       Allergies as of 12/04/2017      Reactions   Dilaudid [hydromorphone Hcl] Hives   Morphine And Related Hives   Peanut-containing Drug Products Hives   Strawberry Extract Swelling   Swelling is of the eye.      Medication List    STOP taking these medications   hydrochlorothiazide 25 MG tablet Commonly known as:  HYDRODIURIL     TAKE these medications   acetaminophen 500 MG tablet Commonly known as:  TYLENOL Take 1 tablet (500 mg total) by mouth every 6 (six) hours as needed.   albuterol 108 (90 Base) MCG/ACT inhaler Commonly known as:  PROVENTIL HFA;VENTOLIN HFA Inhale 2 puffs into the lungs every 6 (six) hours as needed for wheezing or shortness of breath.   amLODipine 10 MG tablet Commonly known as:  NORVASC Take 1 tablet (10 mg total) by mouth at bedtime.   azithromycin 500 MG tablet Commonly known as:  ZITHROMAX Take 1 tablet (500 mg total) by mouth daily.   colchicine 0.6 MG tablet Take 1 tablet (0.6 mg total) by mouth 2 (two) times daily.   dexlansoprazole 60 MG capsule Commonly known as:  DEXILANT Take 1 capsule (60 mg total) by mouth daily for 11 days.   dextromethorphan-guaiFENesin 30-600 MG 12hr tablet Commonly known as:  MUCINEX DM Take 1 tablet by mouth 2 (two)  times daily as needed for cough.   Dulaglutide 0.75 MG/0.5ML Sopn Commonly known as:  TRULICITY Inject under the skin once weekly as directed   furosemide 40 MG tablet Commonly known as:  LASIX Take 1 tablet (40 mg total) by mouth daily.   ibuprofen 400 MG tablet Commonly known as:  ADVIL,MOTRIN Take 1 tablet (400 mg total) by mouth 2 (two) times daily.   metFORMIN 500 MG tablet Commonly known as:  GLUCOPHAGE Take 1 tablet (500 mg total) by mouth 2 (two) times daily with a meal. Hold for next 2 days as you received IV contrast, and resume on 12/05/2017 Start taking on:  12/05/2017   norethindrone 0.35 MG tablet Commonly known as:  MICRONOR,CAMILA,ERRIN Take 1 tablet by mouth daily.   pantoprazole 40 MG tablet Commonly known as:  PROTONIX Take 1 tablet (40 mg total) by mouth daily.   potassium chloride 10 MEQ tablet Commonly known as:  K-DUR Take 1 tablet (10 mEq total) by mouth daily.   WOMENS DAILY FORMULA PO Take 1 tablet by mouth daily.        Discharge Condition: stable   Discharge Instructions Get Medicines reviewed and adjusted: Please take all your medications with you for your next visit with your Primary MD  Please request your Primary MD to go over all hospital tests and procedure/radiological results at the follow up, please ask your Primary MD to get all [facility]  records sent to his/her office.  If you experience worsening of your admission symptoms, develop shortness of breath, life threatening emergency, suicidal or homicidal thoughts you must seek medical attention immediately by calling 911 or calling your MD immediately  if symptoms less severe.  You must read complete instructions/literature along with all the possible adverse reactions/side effects for all the Medicines you take and that have been prescribed to you. Take any new Medicines after you have completely understood and accpet all the possible adverse reactions/side effects.   Do not drive  when taking Pain medications.   Do not take more than prescribed Pain, Sleep and Anxiety Medications  Special Instructions: If you have smoked or chewed Tobacco  in the last 2 yrs please stop smoking, stop any regular Alcohol  and or any Recreational drug use.  Wear Seat belts while driving.  Please note  You were cared for by a hospitalist during your hospital stay. Once you are discharged, your primary care physician will handle any further medical issues. Please note that NO REFILLS for any discharge medications will be authorized once you are discharged, as it is imperative that you return to your primary care physician (or establish a relationship with a primary care physician if you do not have one) for your aftercare needs so that they can reassess your need for medications and monitor your lab values.     Allergies  Allergen Reactions  . Dilaudid [Hydromorphone Hcl] Hives  . Morphine And Related Hives  . Peanut-Containing Drug Products Hives  . Strawberry Extract Swelling    Swelling is of the eye.      Disposition: Discharge disposition: 01-Home or Self Care        Consults:  None     Significant Diagnostic Studies:  Dg Chest 2 View  Result Date: 12/02/2017 CLINICAL DATA:  Shortness of breath, nausea chest pain today. EXAM: CHEST - 2 VIEW COMPARISON:  CT chest 12/01/2017.  PA and lateral chest 11/30/2017. FINDINGS: The lungs are clear. Heart size is normal. No pneumothorax or pleural effusion. No acute or focal bony abnormality. IMPRESSION: Negative chest. Electronically Signed   By: Inge Rise M.D.   On: 12/02/2017 20:48   Dg Chest 2 View  Result Date: 11/30/2017 CLINICAL DATA:  Shortness of breath and chest tightness since this morning, labored breathing in triage, history asthma, type II diabetes mellitus EXAM: CHEST - 2 VIEW COMPARISON:  11/03/2016 FINDINGS: Normal heart size, mediastinal contours, and pulmonary vascularity. Lungs clear. No acute  infiltrate, pleural effusion or pneumothorax. Bones unremarkable. IMPRESSION: No acute abnormalities. Electronically Signed   By: Lavonia Dana M.D.   On: 11/30/2017 20:38   Ct Angio Chest Pe W And/or Wo Contrast  Result Date: 12/01/2017 CLINICAL DATA:  Shortness of breath and chest pain. EXAM: CT ANGIOGRAPHY CHEST WITH CONTRAST TECHNIQUE: Multidetector CT imaging of the chest was performed using the standard protocol during bolus administration of intravenous contrast. Multiplanar CT image reconstructions and MIPs were obtained to evaluate the vascular anatomy. CONTRAST:  44m ISOVUE-370 IOPAMIDOL (ISOVUE-370) INJECTION 76% COMPARISON:  06/15/2011 FINDINGS: Cardiovascular: Inadequate opacification of the pulmonary arteries to exclude pulmonary embolism beyond the main pulmonary arteries. This is multifactorial including reduced dose and body habitus. Normal heart size. No pericardial effusion. Negative thoracic aorta. Mediastinum/Nodes: Negative for adenopathy. Residual thymus is unremarkable morphology, stable from 2013. Lungs/Pleura: Low volumes with interstitial crowding. There is no edema, consolidation, effusion, or pneumothorax. Upper Abdomen: Negative Musculoskeletal: Negative Review of the MIP images confirms  the above findings. IMPRESSION: 1. Technically limited PE study, nondiagnostic beyond the main pulmonary arteries. 2. No acute finding. Electronically Signed   By: Monte Fantasia M.D.   On: 12/01/2017 02:51   Nm Pulmonary Perf And Vent  Result Date: 12/01/2017 CLINICAL DATA:  Shortness of breath and chest pain for 2 days. EXAM: NUCLEAR MEDICINE VENTILATION - PERFUSION LUNG SCAN TECHNIQUE: Ventilation images were obtained in multiple projections using inhaled aerosol Tc-36mDTPA. Perfusion images were obtained in multiple projections after intravenous injection of Tc-911mAA. RADIOPHARMACEUTICALS:  32.0 mCi of Tc-9919mPA aerosol inhalation and 4.1 mCi Tc99m50m IV COMPARISON:  Chest radiographs  11/30/2017.  Chest CTA 12/01/2017. FINDINGS: Ventilation: No focal ventilation defect. Some free pertechnetate noted in the stomach and esophagus. Perfusion: Normal. IMPRESSION: Normal examination.  No evidence of pulmonary embolism. Electronically Signed   By: WillRichardean Sale.   On: 12/01/2017 14:52    echocardiogram       Filed Weights   12/02/17 2012  Weight: 134.3 kg (296 lb)     Microbiology: Recent Results (from the past 240 hour(s))  Urine Culture     Status: Abnormal   Collection Time: 12/01/17  1:05 AM  Result Value Ref Range Status   Specimen Description URINE, RANDOM  Final   Special Requests   Final    NONE Performed at [facility] [address] [id number] MULTIPLE SPECIES PRESENT, SUGGEST RECOLLECTION (A)  Final   Report Status 12/02/2017 FINAL  Final  Culture, blood (x 2)     Status: None (Preliminary result)   Collection Time: 12/03/17  3:28 AM  Result Value Ref Range Status   Specimen Description BLOOD RIGHT HAND  Final   Special Requests   Final    BOTTLES DRAWN AEROBIC AND ANAEROBIC Blood Culture adequate volume   Culture   Final    NO GROWTH 1 DAY Performed at [facility] [address] [id number] Status PENDING  Incomplete  Culture, blood (x 2)     Status: None (Preliminary result)   Collection Time: 12/03/17  3:38 AM  Result Value Ref Range Status   Specimen Description BLOOD LEFT HAND  Final   Special Requests   Final    BOTTLES DRAWN AEROBIC ONLY Blood Culture adequate volume   Culture   Final    NO GROWTH 1 DAY Performed at [facility] [address] [id number] Status PENDING  Incomplete  Respiratory Panel by PCR     Status: None   Collection Time: 12/03/17  6:35 PM  Result Value Ref Range Status   Adenovirus NOT DETECTED NOT DETECTED Final   Coronavirus 229E NOT DETECTED NOT DETECTED Final   Coronavirus HKU1 NOT DETECTED NOT  DETECTED Final   Coronavirus NL63 NOT DETECTED NOT DETECTED Final   Coronavirus OC43 NOT DETECTED NOT DETECTED Final   Metapneumovirus NOT DETECTED NOT DETECTED Final   Rhinovirus / Enterovirus NOT DETECTED NOT DETECTED Final   Influenza A NOT DETECTED NOT DETECTED Final   Influenza B NOT DETECTED NOT DETECTED Final   Parainfluenza Virus 1 NOT DETECTED NOT DETECTED Final   Parainfluenza Virus 2 NOT DETECTED NOT DETECTED Final   Parainfluenza Virus 3 NOT DETECTED NOT DETECTED Final   Parainfluenza Virus 4 NOT DETECTED NOT DETECTED Final   Respiratory Syncytial Virus NOT DETECTED NOT DETECTED Final   Bordetella pertussis NOT  DETECTED NOT DETECTED Final   Chlamydophila pneumoniae NOT DETECTED NOT DETECTED Final   Mycoplasma pneumoniae NOT DETECTED NOT DETECTED Final       Blood Culture    Component Value Date/Time   SDES BLOOD LEFT HAND 12/03/2017 0338   SPECREQUEST  12/03/2017 0338    BOTTLES DRAWN AEROBIC ONLY Blood Culture adequate volume   CULT  12/03/2017 0338    NO GROWTH 1 DAY Performed at [facility], [place] [address] [place], [place] [address]    REPTSTATUS PENDING 12/03/2017 0626      Labs: Results for orders placed or performed during the hospital encounter of 12/02/17 (from the past 48 hour(s))  Basic metabolic panel     Status: Abnormal   Collection Time: 12/02/17  8:33 PM  Result Value Ref Range   Sodium 134 (L) 135 - 145 mmol/L   Potassium 4.1 3.5 - 5.1 mmol/L   Chloride 104 98 - 111 mmol/L    Comment: Please note change in reference range.   CO2 18 (L) 22 - 32 mmol/L   Glucose, Bld 389 (H) 70 - 99 mg/dL    Comment: Please note change in reference range.   BUN 16 6 - 20 mg/dL    Comment: Please note change in reference range.   Creatinine, Ser 1.01 (H) 0.44 - 1.00 mg/dL   Calcium 8.7 (L) 8.9 - 10.3 mg/dL   GFR calc non Af Amer >60 >60 mL/min   GFR calc Af Amer >60 >60 mL/min    Comment: (NOTE) The eGFR has been calculated using the CKD EPI  equation. This calculation has not been validated in all clinical situations. eGFR's persistently <60 mL/min signify possible Chronic Kidney Disease.    Anion gap 12 5 - 15    Comment: Performed at [facility] [address] [place], [place] [address]  CBC     Status: Abnormal   Collection Time: 12/02/17  8:33 PM  Result Value Ref Range   WBC 13.3 (H) 4.0 - 10.5 K/uL   RBC 4.84 3.87 - 5.11 MIL/uL   Hemoglobin 12.2 12.0 - 15.0 g/dL   HCT 38.3 36.0 - 46.0 %   MCV 79.1 78.0 - 100.0 fL   MCH 25.2 (L) 26.0 - 34.0 pg   MCHC 31.9 30.0 - 36.0 g/dL   RDW 14.9 11.5 - 15.5 %   Platelets 395 150 - 400 K/uL    Comment: Performed at [facility], [place] [address] [place], [place] [address]  I-stat troponin, ED     Status: None   Collection Time: 12/02/17  8:39 PM  Result Value Ref Range   Troponin i, poc 0.01 0.00 - 0.08 ng/mL   Comment 3            Comment: Due to the release kinetics of cTnI, a negative result within the first hours of the onset of symptoms does not rule out myocardial infarction with certainty. If myocardial infarction is still suspected, repeat the test at appropriate intervals.   I-Stat beta hCG blood, ED     Status: None   Collection Time: 12/02/17  8:39 PM  Result Value Ref Range   I-stat hCG, quantitative <5.0 <5 mIU/mL   Comment 3            Comment:   GEST. AGE      CONC.  (mIU/mL)   <=1 WEEK        5 - 50  2 WEEKS       50 - 500     3 WEEKS       100 - 10,000     4 WEEKS     1,000 - 30,000        FEMALE AND NON-PREGNANT FEMALE:     LESS THAN 5 mIU/mL   I-stat troponin, ED     Status: None   Collection Time: 12/03/17 12:27 AM  Result Value Ref Range   Troponin i, poc 0.00 0.00 - 0.08 ng/mL   Comment 3            Comment: Due to the release kinetics of cTnI, a negative result within the first hours of the onset of symptoms does not rule out myocardial infarction with certainty. If myocardial infarction is still suspected, repeat the  test at appropriate intervals.   I-Stat Venous Blood Gas, ED (order at [facility] and [facility] only)     Status: Abnormal   Collection Time: 12/03/17 12:29 AM  Result Value Ref Range   pH, Ven 7.400 7.250 - 7.430   pCO2, Ven 32.4 (L) 44.0 - 60.0 mmHg   pO2, Ven 31.0 (LL) 32.0 - 45.0 mmHg   Bicarbonate 20.1 20.0 - 28.0 mmol/L   TCO2 21 (L) 22 - 32 mmol/L   O2 Saturation 61.0 %   Acid-base deficit 4.0 (H) 0.0 - 2.0 mmol/L   Patient temperature HIDE    Sample type VENOUS    Comment NOTIFIED PHYSICIAN   I-Stat CG4 Lactic Acid, ED     Status: Abnormal   Collection Time: 12/03/17 12:30 AM  Result Value Ref Range   Lactic Acid, Venous 4.73 (HH) 0.5 - 1.9 mmol/L   Comment NOTIFIED PHYSICIAN   CBG monitoring, ED     Status: Abnormal   Collection Time: 12/03/17  2:16 AM  Result Value Ref Range   Glucose-Capillary 345 (H) 70 - 99 mg/dL  Culture, blood (x 2)     Status: None (Preliminary result)   Collection Time: 12/03/17  3:28 AM  Result Value Ref Range   Specimen Description BLOOD RIGHT HAND    Special Requests      BOTTLES DRAWN AEROBIC AND ANAEROBIC Blood Culture adequate volume   Culture      NO GROWTH 1 DAY Performed at [facility], [address] [place], [place] [address]    Report Status PENDING   Culture, blood (x 2)     Status: None (Preliminary result)   Collection Time: 12/03/17  3:38 AM  Result Value Ref Range   Specimen Description BLOOD LEFT HAND    Special Requests      BOTTLES DRAWN AEROBIC ONLY Blood Culture adequate volume   Culture      NO GROWTH 1 DAY Performed at [facility], [place] [address], [place], [place] [address]    Report Status PENDING   Troponin I (q 6hr x 3)     Status: None   Collection Time: 12/03/17  3:44 AM  Result Value Ref Range   Troponin I <0.03 <0.03 ng/mL    Comment: Performed at [facility] [address], [place], [place] [address]  Procalcitonin     Status: None   Collection Time: 12/03/17  3:44 AM  Result Value Ref  Range   Procalcitonin <0.10 ng/mL    Comment:        Interpretation: PCT (Procalcitonin) <= 0.5 ng/mL: Systemic infection (sepsis) is not likely. Local bacterial infection  is possible. (NOTE)       Sepsis PCT Algorithm           Lower Respiratory Tract                                      Infection PCT Algorithm    ----------------------------     ----------------------------         PCT < 0.25 ng/mL                PCT < 0.10 ng/mL         Strongly encourage             Strongly discourage   discontinuation of antibiotics    initiation of antibiotics    ----------------------------     -----------------------------       PCT 0.25 - 0.50 ng/mL            PCT 0.10 - 0.25 ng/mL               OR       >80% decrease in PCT            Discourage initiation of                                            antibiotics      Encourage discontinuation           of antibiotics    ----------------------------     -----------------------------         PCT >= 0.50 ng/mL              PCT 0.26 - 0.50 ng/mL               AND        <80% decrease in PCT             Encourage initiation of                                             antibiotics       Encourage continuation           of antibiotics    ----------------------------     -----------------------------        PCT >= 0.50 ng/mL                  PCT > 0.50 ng/mL               AND         increase in PCT                  Strongly encourage                                      initiation of antibiotics    Strongly encourage escalation           of antibiotics                                     -----------------------------  PCT <= 0.25 ng/mL                                                 OR                                        > 80% decrease in PCT                                     Discontinue / Do not initiate                                             antibiotics Performed at [facility], [place] [address], [place], [place] [address]   Lactic acid, plasma     Status: Abnormal   Collection Time: 12/03/17  3:46 AM  Result Value Ref Range   Lactic Acid, Venous 3.9 (HH) 0.5 - 1.9 mmol/L    Comment: CRITICAL RESULT CALLED TO, READ BACK BY AND VERIFIED WITH: Maryruth Bun [id number] [place] Performed at [facility], [place] [address], [place], [place] [address]   Brain natriuretic peptide     Status: None   Collection Time: 12/03/17  3:46 AM  Result Value Ref Range   B Natriuretic Peptide 45.1 0.0 - 100.0 pg/mL    Comment: Performed at [facility] [address], [place], [place] [address]  Troponin I (q 6hr x 3)     Status: None   Collection Time: 12/03/17  6:49 AM  Result Value Ref Range   Troponin I <0.03 <0.03 ng/mL    Comment: Performed at [facility] [address], [place], [place] [address]  Lactic acid, plasma     Status: Abnormal   Collection Time: 12/03/17  6:49 AM  Result Value Ref Range   Lactic Acid, Venous 2.4 (HH) 0.5 - 1.9 mmol/L    Comment: CRITICAL RESULT CALLED TO, READ BACK BY AND VERIFIED WITH: C.HUTCHENS,RN 0829 02/03/18 [place],S CORRECT CALL DATE 12/03/17 Performed at [facility] [address] [place], [place] [address] CORRECTED ON 07/10 AT 1300: PREVIOUSLY REPORTED AS 2.4 CRITICAL RESULT CALLED TO, READ BACK BY AND VERIFIED WITH: C.HUTCHENS,RN 0829 02/03/18 CLARK,S   Basic metabolic panel     Status: Abnormal   Collection Time: 12/03/17  6:49 AM  Result Value Ref Range   Sodium 139 135 - 145 mmol/L   Potassium 3.7 3.5 - 5.1 mmol/L   Chloride 107 98 - 111 mmol/L    Comment: Please note change in reference range.   CO2 22 22 - 32 mmol/L   Glucose, Bld 286 (H) 70 - 99 mg/dL    Comment: Please note change in reference range.   BUN 15 6 - 20 mg/dL    Comment: Please note change in reference range.   Creatinine, Ser 0.86 0.44 - 1.00 mg/dL   Calcium 7.6 (L) 8.9 - 10.3 mg/dL   GFR calc non Af Amer >60 >60 mL/min    GFR calc Af Amer >60 >60 mL/min  Comment: (NOTE) The eGFR has been calculated using the CKD EPI equation. This calculation has not been validated in all clinical situations. eGFR's persistently <60 mL/min signify possible Chronic Kidney Disease.    Anion gap 10 5 - 15    Comment: Performed at [facility] [address], [place], [place] [address]  CBC     Status: Abnormal   Collection Time: 12/03/17  6:49 AM  Result Value Ref Range   WBC 11.1 (H) 4.0 - 10.5 K/uL   RBC 4.20 3.87 - 5.11 MIL/uL   Hemoglobin 10.6 (L) 12.0 - 15.0 g/dL   HCT 33.1 (L) 36.0 - 46.0 %   MCV 78.8 78.0 - 100.0 fL   MCH 25.2 (L) 26.0 - 34.0 pg   MCHC 32.0 30.0 - 36.0 g/dL   RDW 15.1 11.5 - 15.5 %   Platelets 333 150 - 400 K/uL    Comment: Performed at [facility], [place] [address] [place], [place] [address]  Rapid urine drug screen (hospital performed)     Status: Abnormal   Collection Time: 12/03/17  8:16 AM  Result Value Ref Range   Opiates NONE DETECTED NONE DETECTED   Cocaine NONE DETECTED NONE DETECTED   Benzodiazepines NONE DETECTED NONE DETECTED   Amphetamines NONE DETECTED NONE DETECTED   Tetrahydrocannabinol NONE DETECTED NONE DETECTED   Barbiturates (A) NONE DETECTED    Result not available. Reagent lot number recalled by manufacturer.    Comment: Performed at [facility], [place] [address], [place], [place] [address]  Glucose, capillary     Status: Abnormal   Collection Time: 12/03/17  9:04 AM  Result Value Ref Range   Glucose-Capillary 231 (H) 70 - 99 mg/dL  Glucose, capillary     Status: Abnormal   Collection Time: 12/03/17 11:37 AM  Result Value Ref Range   Glucose-Capillary 250 (H) 70 - 99 mg/dL  Troponin I (q 6hr x 3)     Status: None   Collection Time: 12/03/17  1:33 PM  Result Value Ref Range   Troponin I <0.03 <0.03 ng/mL    Comment: Performed at [facility], [place] [address] [place], [place] [address]  Glucose, capillary     Status: Abnormal    Collection Time: 12/03/17  4:36 PM  Result Value Ref Range   Glucose-Capillary 224 (H) 70 - 99 mg/dL  Respiratory Panel by PCR     Status: None   Collection Time: 12/03/17  6:35 PM  Result Value Ref Range   Adenovirus NOT DETECTED NOT DETECTED   Coronavirus 229E NOT DETECTED NOT DETECTED   Coronavirus HKU1 NOT DETECTED NOT DETECTED   Coronavirus NL63 NOT DETECTED NOT DETECTED   Coronavirus OC43 NOT DETECTED NOT DETECTED   Metapneumovirus NOT DETECTED NOT DETECTED   Rhinovirus / Enterovirus NOT DETECTED NOT DETECTED   Influenza A NOT DETECTED NOT DETECTED   Influenza B NOT DETECTED NOT DETECTED   Parainfluenza Virus 1 NOT DETECTED NOT DETECTED   Parainfluenza Virus 2 NOT DETECTED NOT DETECTED   Parainfluenza Virus 3 NOT DETECTED NOT DETECTED   Parainfluenza Virus 4 NOT DETECTED NOT DETECTED   Respiratory Syncytial Virus NOT DETECTED NOT DETECTED   Bordetella pertussis NOT DETECTED NOT DETECTED   Chlamydophila pneumoniae NOT DETECTED NOT DETECTED   Mycoplasma pneumoniae NOT DETECTED NOT DETECTED  Glucose, capillary     Status: Abnormal   Collection Time: 12/03/17  9:18 PM  Result Value Ref Range   Glucose-Capillary 202 (  H) 70 - 99 mg/dL  Lactic acid, plasma     Status: Abnormal   Collection Time: 12/03/17  9:19 PM  Result Value Ref Range   Lactic Acid, Venous 2.4 (HH) 0.5 - 1.9 mmol/L    Comment: CRITICAL RESULT CALLED TO, READ BACK BY AND VERIFIED WITH: SILAS,C RN 12/03/2017 2310 JORDANS Performed at [facility], [place] [address] [place], [place] [address]   CBC     Status: Abnormal   Collection Time: 12/04/17  4:20 AM  Result Value Ref Range   WBC 8.8 4.0 - 10.5 K/uL   RBC 4.10 3.87 - 5.11 MIL/uL   Hemoglobin 10.6 (L) 12.0 - 15.0 g/dL   HCT 32.5 (L) 36.0 - 46.0 %   MCV 79.3 78.0 - 100.0 fL   MCH 25.9 (L) 26.0 - 34.0 pg   MCHC 32.6 30.0 - 36.0 g/dL   RDW 14.7 11.5 - 15.5 %   Platelets 254 150 - 400 K/uL    Comment: Performed at [facility], [place] [address] [place], [place] [address]  Comprehensive metabolic panel     Status: Abnormal   Collection Time: 12/04/17  4:20 AM  Result Value Ref Range   Sodium 139 135 - 145 mmol/L   Potassium 3.1 (L) 3.5 - 5.1 mmol/L   Chloride 106 98 - 111 mmol/L    Comment: Please note change in reference range.   CO2 24 22 - 32 mmol/L   Glucose, Bld 159 (H) 70 - 99 mg/dL    Comment: Please note change in reference range.   BUN 10 6 - 20 mg/dL    Comment: Please note change in reference range.   Creatinine, Ser 0.63 0.44 - 1.00 mg/dL   Calcium 8.0 (L) 8.9 - 10.3 mg/dL   Total Protein 5.9 (L) 6.5 - 8.1 g/dL   Albumin 3.0 (L) 3.5 - 5.0 g/dL   AST 14 (L) 15 - 41 U/L   ALT 16 0 - 44 U/L    Comment: Please note change in reference range.   Alkaline Phosphatase 47 38 - 126 U/L   Total Bilirubin 0.3 0.3 - 1.2 mg/dL   GFR calc non Af Amer >60 >60 mL/min   GFR calc Af Amer >60 >60 mL/min    Comment: (NOTE) The eGFR has been calculated using the CKD EPI equation. This calculation has not been validated in all clinical situations. eGFR's persistently <60 mL/min signify possible Chronic Kidney Disease.    Anion gap 9 5 - 15    Comment: Performed at [facility] [address], [place], [place] [address]  Lactic acid, plasma     Status: None   Collection Time: 12/04/17  4:20 AM  Result Value Ref Range   Lactic Acid, Venous 1.7 0.5 - 1.9 mmol/L    Comment: Performed at [facility] [address], [place], [place] [address]  Glucose, capillary     Status: Abnormal   Collection Time: 12/04/17  7:39 AM  Result Value Ref Range   Glucose-Capillary 146 (H) 70 - 99 mg/dL     Lipid Panel     Component Value Date/Time   CHOL 196 05/13/2016 1538   TRIG 158 (H) 05/13/2016 1538   HDL 63 05/13/2016 1538   CHOLHDL 3.1 05/13/2016 1538   VLDL 32 (H) 05/13/2016 1538   LDLCALC 101 (H) 05/13/2016 1538     Lab Results  Component Value Date   HGBA1C 7.2 08/20/2017  HGBA1C 8.1 04/30/2017   HGBA1C 6.1  10/09/2016     Lab Results  Component Value Date   MICROALBUR 20.2 05/13/2016   LDLCALC 101 (H) 05/13/2016   CREATININE 0.63 12/04/2017     HPI   37 year old female with a history of hypertension, diabetes mellitus, asthma, GERD, OSA, morbid obesity, who presents with shortness breath and chest pain.hospitalized 7/7-7/9 due to shortness of breath and chest pain, ruled out for PE, DVT, thought to have acute pericarditis and started on colchicine and ibuprofen, who presents to the ED because of persistent shortness of breath, cough, sputum production, lactic acidosis, now admitted for observation    HOSPITAL COURSE:     SOB (shortness of breath):previously thought to be secondary to obstructive sleep apnea/asthma/pericarditis,nooxygen desaturation.   Pt has nowheezing or rhonchi on auscultation, does not seem to have asthma exacerbation.Patient had negative work-up for PEinprevious admission. She reportssore throat and productive cough with colored sputum production,started on azithromycin, which she will continue for another 3 days  -continueCPAP, assessed for home oxygen prior to dc  -Nebulizers: scheduled Duoneb and prn albuterol -Mucinex for cough  -Follow up blood culture x2 negative , sputum culture, respiratory virus panel negative  -Nasal cannula oxygen as neededto maintain O2 saturation 92% or greater    Asthma: nowheezing or rhonchi on auscultation, does not seem to have asthma exacerbation. -Bronchodilators as above  OSA: -CPAP  Lactic acid acidosis:Lactic acid of 4.73. Patient has leukocytosis with WBC 13.3, but no fever. Leukocytosis is likely due to recent steroid use. Possibly due totachypneaand hardrespiratory muscleworking.Metformin use may have contributed partially. Will start CPAP due decreaserespiratory muscle workload Negative Procalcitonin , doubt sepsis -IVF:3.5L of NS bolus in ED  Controlled type 2 diabetes mellitus with  microalbuminuria, without long-term current use of insulin (HCC):Last A1c7.2, fairlycontroled. Patient is takingTrulicity and metforminat home Resume trulicvity  Essential hypertension, benign: -Continue amlodipine -Hold HCTZ , changed to lasix 40 mg /day for diastolic HF on echo on 7/8  Chest pain: possible due to pericarditis.  -continecolchicine and a ibuprofen -trop x 3 has been negative      Discharge Exam:   Blood pressure 112/70, pulse 66, temperature 98.2 F (36.8 C), temperature source Oral, resp. rate 17, height _0  (1.575 m), weight 134.3 kg (296 lb), last menstrual period 12/02/2017, SpO2 99 %, not currently breastfeeding.  Respiratory: No rales, wheezing, rhonchi or rubs. GI: Soft, nondistended, nontender, no rebound pain, no organomegaly, BS present. GU: No hematuria Ext: No pitting leg edema bilaterally. 2+DP/PT pulse bilaterally. Musculoskeletal: No joint deformities, No joint redness or warmth, no limitation of ROM in spin. Skin: No rashes.  Neuro: Alert, oriented X3, cranial nerves II-XII grossly intact, moves all extremities normally.  Psych: Patient is not psychotic, no suicidal or hemocidal ideation.         SignedReyne Dumas 12/04/2017, 11:00 AM      Time needed to  prevent discharge, discussed with the patient and family 35 minutes

## 2017-12-04 NOTE — Progress Notes (Signed)
SATURATION QUALIFICATIONS: (This note is used to comply with regulatory documentation for home oxygen)  Patient Saturations on Room Air at Rest = 99%  Patient Saturations on Room Air while Ambulating = 98%  Patient Saturations on 2 Liters of oxygen while Ambulating = 99%  Please briefly explain why patient needs home oxygen:

## 2017-12-07 NOTE — Progress Notes (Addendum)
Chief Complaint  Patient presents with  . Hospitalization Follow-up    hospital follow up and FMLA paperwork.    Patient presents for hospital follow-up. She reports that shortness of breath persists, but has improved a lot.  Some central chest pressure remains as well.  She was hospitalized twice earlier this month with complaint of shortness of breath. She was admitted 7/7-7/9 due to shortness of breath and chest pain, ruled out for PE, DVT, thought to have acute pericarditis and started on colchicine and ibuprofen. Sugar was elevated and she had hypokalemia at that time as well. (she had not been taking Trulicity prior to admission.  Had been taking at last visit here in March, stopped due to abdominal pain on her own). Metformin was held x 2d due to the contrast she received. She returned to the ED later on 7/9 because of persistent shortness of breath, had developed cough with purulent sputum production, also found to have lactic acidosis.  She was treated with z-pak. It was not felt that she was having any asthma exacerbation.  Blood cultures were negative.   Cough persists, but getting up less mucus, lighter in color.  She was told by pharmacist not to take colchicine and azithromycin due to an interaction. She filled both meds (the zithro was 3 pills), but hasn't taken any of the remainder of the azithromycin (just what was given in the hospital). As stated above, cough/phlegm is somewhat improved.  She was found to have lactic acid level of 4.73. WBC was elevated at 13.3, likely due to recent steroid use (during 1st admission), also felt possibly due totachypneaand hardrespiratory muscleworking.Metformin use may have contributed partially. CPAP was used while in hospital to due decreaserespiratory muscle workload. Lactic acid level was down to 1.7 prior to discharge.  Hypertension:  HCTZ was supposed to be stopped, changed to lasix 88FO/Y, for diastolic HF on echo on 7/8.  She continues  on amlodipine 13m daily and potassium.   She didn't bring in her paperwork, but states it wasn't clear about the medications. She wasn't aware that she was to stop the HCTZ and switch to furosemide.  She picked up the prescription for furosemide, but didn't start it (because she was still taking HCTZ, didn't think she should take both). She denies any swelling.  She denies muscle cramps. BP was 151/103 on the day after discharge, checked at her parents, hasn't checked her blood pressure since. Slight headaches, more related to her cold--ear plugging, some sinus pressure.  Pericarditis was the diagnosis given for her pleuritic chest pain from the first hospitalization. Treatment consists of colchicine 0.629mBID x 3 months.  Per cardiology consult note, it also stated "NSAID 60060mID for 2 weeks"; her discharge paperwork said ibuprofen 400m68mD.  She has been taking 400mg50m as prescribed, but as stated above, has some persistent chest pressure.  Also notes said to take Dexilant while on the NSAIDS.  She doesn't have this, and wasn't given a prescription.  Denies any abdominal pain, reflux or heartburn. Discussed using OTC if she develops and GI side effects from the NSAIDS.  Hypokalemia:  Very low at ER visit in March, was started on 10mEq62mher f/u visit with me.  She was very late in getting her f/u b-met, had it in June and K+ was 3.5.  She was advised multiple times to schedule OV here, but had not done so prior to her ER visits/hospitalizations.  (In fact she made appointment elsewhere in  April to establish with another provider, and she no-showed that visit). Looks like her K+ prior to discharge 7/11 was low at 3.1. She thinks she was given 2 potassium pills prior the morning she went home. She has been taking 10 mEq K+ daily.  Asthma--she reports she has been wheezing, and needing albuterol nebulizer every 3-4 hours for the past 2 days. She does note improvement after the treatment.  Last  nebulizer treatment was last night.  Diabetes--took Trulicity until the middle of June.  She found it made her stomach hurt worse, feels better since she stopped it.  She had pain described as cramping, across the middle/lower portion of her stomach.  She would have some nausea and "sour belch", which she could deal with, but couldn't deal with the pain.  She had tried Pepto bismol, didn't take PPI ever. We were not contacted with these side effects or notified of her stopping this medication.  She is back on Metformin since the second discharge. Sugars are running close to 200 or over.  Contraception: She reports that the hospital didn't have her OCP, so she hasn't been taking it. (period finished while in hospital, when she should have started new pack).   PMH, PSH, SH reviewed  Outpatient Encounter Medications as of 12/08/2017  Medication Sig Note  . amLODipine (NORVASC) 10 MG tablet Take 1 tablet (10 mg total) by mouth at bedtime.   . colchicine 0.6 MG tablet Take 1 tablet (0.6 mg total) by mouth 2 (two) times daily.   Marland Kitchen ibuprofen (ADVIL,MOTRIN) 400 MG tablet Take 1 tablet (400 mg total) by mouth 2 (two) times daily.   . metFORMIN (GLUCOPHAGE) 500 MG tablet Take 1 tablet (500 mg total) by mouth 2 (two) times daily with a meal. Hold for next 2 days as you received IV contrast, and resume on 12/05/2017   . Multiple Vitamins-Minerals (WOMENS DAILY FORMULA PO) Take 1 tablet by mouth daily.   . potassium chloride (K-DUR) 10 MEQ tablet Take 1 tablet (10 mEq total) by mouth daily.   . [DISCONTINUED] azithromycin (ZITHROMAX) 500 MG tablet Take 1 tablet (500 mg total) by mouth daily.   Marland Kitchen albuterol (PROVENTIL HFA;VENTOLIN HFA) 108 (90 Base) MCG/ACT inhaler Inhale 2 puffs into the lungs every 6 (six) hours as needed for wheezing or shortness of breath. (Patient not taking: Reported on 12/08/2017) 12/08/2017: Has been using nebulized albuterol every 3-4 hours since discharge  . dexlansoprazole (DEXILANT) 60  MG capsule Take 1 capsule (60 mg total) by mouth daily for 11 days. (Patient not taking: Reported on 11/30/2017)   . Dulaglutide (TRULICITY) 3.66 YQ/0.3KV SOPN Inject under the skin once weekly as directed (Patient not taking: Reported on 11/30/2017) 12/08/2017: Stopped it in the middle of June  . furosemide (LASIX) 40 MG tablet Take 1 tablet (40 mg total) by mouth daily. (Patient not taking: Reported on 12/08/2017)   . norethindrone (MICRONOR,CAMILA,ERRIN) 0.35 MG tablet Take 1 tablet by mouth daily.   . pantoprazole (PROTONIX) 40 MG tablet Take 1 tablet (40 mg total) by mouth daily. (Patient not taking: Reported on 12/08/2017)   . [DISCONTINUED] acetaminophen (TYLENOL) 500 MG tablet Take 1 tablet (500 mg total) by mouth every 6 (six) hours as needed. (Patient taking differently: Take 500 mg by mouth every 6 (six) hours as needed. )   . [DISCONTINUED] dextromethorphan-guaiFENesin (MUCINEX DM) 30-600 MG 12hr tablet Take 1 tablet by mouth 2 (two) times daily as needed for cough. (Patient not taking: Reported on 12/08/2017)  No facility-administered encounter medications on file as of 12/08/2017.    Allergies  Allergen Reactions  . Dilaudid [Hydromorphone Hcl] Hives  . Morphine And Related Hives  . Peanut-Containing Drug Products Hives  . Strawberry Extract Swelling    Swelling is of the eye.    ROS:  No fever, chills. URI symptoms and slight headache as per HPI.  Persistent cough, chest pain and shortness of breath per HPI.  SOB is responding to albuterol nebulizer; overall improved.  Chest pain is a pressure, mid-chest.  No nausea, vomiting, diarrhea, bleeding, bruising, rashes. No urinary complaints or abdominal pain.  No edema, muscle cramps.   PHYSICAL EXAM:  BP 130/84   Pulse 92   Ht 5' 4"  (1.626 m)   Wt 293 lb (132.9 kg)   LMP 11/30/2017 (Exact Date)   Breastfeeding? No   BMI 50.29 kg/m   Wt Readings from Last 3 Encounters:  12/08/17 293 lb (132.9 kg)  12/02/17 296 lb (134.3 kg)   12/01/17 296 lb 4.8 oz (134.4 kg)   BP Readings from Last 3 Encounters:  12/08/17 130/84  12/04/17 112/70  12/02/17 (!) 141/93   Some coughing during visit. Appears comfortable, but mouth-breathing due to nasal congestion. She is speaking easily in full sentences, doesn't appear to be in any distress HEENT: PERRL, EOMI, conjunctiva and sclera are clear. TM's notable for slight effusions bilaterally, no erythema.  Nasal mucosa is mild-mod edematous, no purulence. Tender over frontal sinuses bilaterally. OP is clear Neck: no lymphadenopathy, thyromegaly or mass Heart: regular rate and rhythm Chest: mildly tender in center of sternum.  nontender at costochondral junctions. Lungs clear bilaterally, no wheezes, rales, ronchi Abdomen: obese, nontender, soft, no masses Extremities: no edema, normal pulses Psych: normal mood, affect, hygiene and grooming Neuro: alert and oriented, cranial nerves intact, normal gait.  PF 360/450/400  Echo 12/01/17: Study Conclusions  - Left ventricle: The cavity size was normal. Wall thickness was   normal. Systolic function was normal. The estimated ejection   fraction was in the range of 55% to 60%. Wall motion was normal;   there were no regional wall motion abnormalities. Doppler   parameters are consistent with abnormal left ventricular   relaxation (grade 1 diastolic dysfunction).  Impressions: - Normal LV systolic function; mild diastolic dysfunction.  CXR 12/02/17 without acute changes All labs and studies from hospitalizations reviewed.    Chemistry      Component Value Date/Time   NA 139 12/04/2017 0420   NA 142 11/07/2017 1122   K 3.1 (L) 12/04/2017 0420   CL 106 12/04/2017 0420   CO2 24 12/04/2017 0420   BUN 10 12/04/2017 0420   BUN 11 11/07/2017 1122   CREATININE 0.63 12/04/2017 0420   CREATININE 0.57 04/30/2017 1340      Component Value Date/Time   CALCIUM 8.0 (L) 12/04/2017 0420   ALKPHOS 47 12/04/2017 0420   AST 14 (L)  12/04/2017 0420   ALT 16 12/04/2017 0420   BILITOT 0.3 12/04/2017 0420     Lab Results  Component Value Date   WBC 8.8 12/04/2017   HGB 10.6 (L) 12/04/2017   HCT 32.5 (L) 12/04/2017   MCV 79.3 12/04/2017   PLT 254 12/04/2017   Lactic Acid, Venous    Component Value Date/Time   LATICACIDVEN 1.7 12/04/2017 0420    Lab Results  Component Value Date   HGBA1C 7.9 (A) 12/08/2017    ASSESSMENT/PLAN:  Acute pericarditis, unspecified type - cont colchicine and ibuprofen. Will check  with cardiologist--ikely okay to increase dose to 671m TID since having pain  Hypokalemia - due for recheck. Will check with cards re: change of diuretic. consider K+ sparing - Plan: Comprehensive metabolic panel  Essential hypertension, benign  Controlled type 2 diabetes mellitus with microalbuminuria, without long-term current use of insulin (HCC) - A1c above goal. Cont metformin if normal lactic acid; rec trying Bydureon B-cise (sample to see if causes abdominal pain); if can't tolerated, need add'l med. - Plan: HgB A1c  Lactic acid acidosis - resolved, ?etiology, multifactorial. Has been back on metformin, will recheck. - Plan: Lactic Acid, Plasma  Medication monitoring encounter - Plan: Comprehensive metabolic panel, CBC with Differential/Platelet  Upper respiratory tract infection, unspecified type - treated with azithromycin while in hospital, course not completed due to med interaction; continues to improve. Contact [place] if worsening    CBC, c-met, recheck lactic acid plasma  Supposed to work tomorrow, and then not again until Saturday. Thinks she can work. Wants note to RTW.  Requesting FMLA forms be filled out for her and also for her husband.  Diabetes--not well controlled currently. Discussed trying B-cise, cannot guarantee side effects will be less than Trulicity, but she would be willing to try a sample. If also causes abdominal pain, will need change in therapy.  Starting with this to also assist in weight loss. She has never been on other diabetes meds other than the metformin and trulicity.  Has never taken onglyza/januvia/tradjenta or farxiga/jardiance/invokana.  Pt advised to monitor BP and glu, bring lists to f/u.  F/u 2 weeks.  Contacted cardiologist (Dr. NMeda Coffee re: diuretics and NSAID dosing--okay to increase to 6064mTID, and will change to 2513mpironolactone. (May need dose further titrated, depending on BP and K+--to be rechecked on return).  45 min visit, more than 1/2 spent counseling (plus 2 forms filled out after visit, not included in this time)  Be sure to use condoms this month, and the first month that you restart your birth control pills.  At this point, since your phlegm seems to be improving, you can hold off on taking the last 3 antibiotic pills (as you have already done). If they are needed, we would need to either change the antibiotic, or have you take a lower dose of colchicine. For now, take mucinex and do sinus rinses twice daily to help with sinus pain/pressure. If the mucus becomes darker, more sinus pain or fever develops, let us [place].

## 2017-12-08 ENCOUNTER — Encounter: Payer: Self-pay | Admitting: Family Medicine

## 2017-12-08 ENCOUNTER — Ambulatory Visit: Payer: 59 | Admitting: Family Medicine

## 2017-12-08 ENCOUNTER — Telehealth: Payer: Self-pay | Admitting: Family Medicine

## 2017-12-08 VITALS — BP 130/84 | HR 92 | Ht 64.0 in | Wt 293.0 lb

## 2017-12-08 DIAGNOSIS — E872 Acidosis, unspecified: Secondary | ICD-10-CM

## 2017-12-08 DIAGNOSIS — I10 Essential (primary) hypertension: Secondary | ICD-10-CM | POA: Diagnosis not present

## 2017-12-08 DIAGNOSIS — E1129 Type 2 diabetes mellitus with other diabetic kidney complication: Secondary | ICD-10-CM | POA: Diagnosis not present

## 2017-12-08 DIAGNOSIS — I309 Acute pericarditis, unspecified: Secondary | ICD-10-CM | POA: Diagnosis not present

## 2017-12-08 DIAGNOSIS — R809 Proteinuria, unspecified: Secondary | ICD-10-CM | POA: Diagnosis not present

## 2017-12-08 DIAGNOSIS — J069 Acute upper respiratory infection, unspecified: Secondary | ICD-10-CM | POA: Diagnosis not present

## 2017-12-08 DIAGNOSIS — I5189 Other ill-defined heart diseases: Secondary | ICD-10-CM

## 2017-12-08 DIAGNOSIS — Z5181 Encounter for therapeutic drug level monitoring: Secondary | ICD-10-CM | POA: Diagnosis not present

## 2017-12-08 DIAGNOSIS — E876 Hypokalemia: Secondary | ICD-10-CM

## 2017-12-08 LAB — COMPREHENSIVE METABOLIC PANEL
ALT: 15 IU/L (ref 0–32)
AST: 12 IU/L (ref 0–40)
Albumin/Globulin Ratio: 1.4 (ref 1.2–2.2)
Albumin: 3.8 g/dL (ref 3.5–5.5)
Alkaline Phosphatase: 66 IU/L (ref 39–117)
BUN/Creatinine Ratio: 16 (ref 9–23)
BUN: 10 mg/dL (ref 6–20)
Bilirubin Total: 0.2 mg/dL (ref 0.0–1.2)
CALCIUM: 10.7 mg/dL — AB (ref 8.7–10.2)
CO2: 22 mmol/L (ref 20–29)
CREATININE: 0.63 mg/dL (ref 0.57–1.00)
Chloride: 95 mmol/L — ABNORMAL LOW (ref 96–106)
GFR calc Af Amer: 135 mL/min/{1.73_m2} (ref >59)
GFR, EST NON AFRICAN AMERICAN: 117 mL/min/{1.73_m2} (ref >59)
Globulin, Total: 2.7 g/dL (ref 1.5–4.5)
Glucose: 156 mg/dL — ABNORMAL HIGH (ref 65–99)
Potassium: 3.3 mmol/L — ABNORMAL LOW (ref 3.5–5.2)
Sodium: 135 mmol/L (ref 134–144)
Total Protein: 6.5 g/dL (ref 6.0–8.5)

## 2017-12-08 LAB — CBC WITH DIFFERENTIAL/PLATELET
BASOS: 0 %
Basophils Absolute: 0 10*3/uL (ref 0.0–0.2)
EOS (ABSOLUTE): 0.4 10*3/uL (ref 0.0–0.4)
Eos: 5 %
HEMATOCRIT: 35.7 % (ref 34.0–46.6)
Hemoglobin: 12.3 g/dL (ref 11.1–15.9)
IMMATURE GRANS (ABS): 0 10*3/uL (ref 0.0–0.1)
IMMATURE GRANULOCYTES: 1 %
LYMPHS: 44 %
Lymphocytes Absolute: 3.1 10*3/uL (ref 0.7–3.1)
MCH: 25.9 pg — ABNORMAL LOW (ref 26.6–33.0)
MCHC: 34.5 g/dL (ref 31.5–35.7)
MCV: 75 fL — AB (ref 79–97)
Monocytes Absolute: 0.5 10*3/uL (ref 0.1–0.9)
Monocytes: 7 %
NEUTROS PCT: 43 %
Neutrophils Absolute: 3.1 10*3/uL (ref 1.4–7.0)
PLATELETS: 310 10*3/uL (ref 150–450)
RBC: 4.75 x10E6/uL (ref 3.77–5.28)
RDW: 15.4 % (ref 12.3–15.4)
WBC: 7.1 10*3/uL (ref 3.4–10.8)

## 2017-12-08 LAB — CULTURE, BLOOD (ROUTINE X 2)
Culture: NO GROWTH
Culture: NO GROWTH
Special Requests: ADEQUATE
Special Requests: ADEQUATE

## 2017-12-08 LAB — POCT GLYCOSYLATED HEMOGLOBIN (HGB A1C): Hemoglobin A1C: 7.9 % — AB (ref 4.0–5.6)

## 2017-12-08 LAB — LACTIC ACID, PLASMA: LACTATE: 18.6 mg/dL (ref 4.8–25.7)

## 2017-12-08 NOTE — Telephone Encounter (Signed)
  FMLA forms received and sent back in folder

## 2017-12-08 NOTE — Patient Instructions (Addendum)
  Be sure to use condoms this month, and the first month that you restart your birth control pills.  At this point, since your phlegm seems to be improving, you can hold off on taking the last 3 antibiotic pills (as you have already done). If they are needed, we would need to either change the antibiotic, or have you take a lower dose of colchicine. For now, take mucinex and do sinus rinses twice daily to help with sinus pain/pressure. If the mucus becomes darker, more sinus pain or fever develops, let us know.   I will be in touch in with your lab results and what to do regarding your blood pressure medications/diuretic (continue the amlodipine for sure) and the diabetes medications.

## 2017-12-09 ENCOUNTER — Telehealth: Payer: Self-pay

## 2017-12-09 MED ORDER — SPIRONOLACTONE 25 MG PO TABS: 25.0000 mg | ORAL_TABLET | Freq: Every day | ORAL | 0 refills | Status: DC

## 2017-12-09 NOTE — Telephone Encounter (Signed)
FMLA paperwork has been faxed over for [place] [name]. Paperwork has been placed in Dr. Tomi Bamberger folder.

## 2017-12-10 ENCOUNTER — Other Ambulatory Visit: Payer: Self-pay | Admitting: *Deleted

## 2017-12-10 MED ORDER — EXENATIDE ER 2 MG/0.85ML ~~LOC~~ AUIJ: 2.0000 mg | AUTO-INJECTOR | SUBCUTANEOUS | 0 refills | Status: DC

## 2017-12-23 ENCOUNTER — Ambulatory Visit: Payer: 59 | Admitting: Cardiology

## 2017-12-31 MED FILL — POTASSIUM CL 10 MEQ TAB SA: 10 | 30 days supply | Qty: 30 | Fill #2

## 2017-12-31 MED FILL — AMLODIPINE BESYLATE 10 MG T: 10 | 30 days supply | Qty: 30 | Fill #4

## 2017-12-31 MED FILL — SPIRONOLACTONE 25 MG TABLET: 25 | 30 days supply | Qty: 30 | Fill #0

## 2018-01-10 NOTE — Progress Notes (Deleted)
   Patient presents to f/u on multiple medical issues.  DM--She is taking metformin without side effects.  Stopped Trulicity in the past due to abdominal pain.  Given Bydureon Bcise sample last month as a trial to see if she tolerated it better.  She was supposed to f/u in 2 weeks (it is now a month later).  Sugars are running Lab Results  Component Value Date   HGBA1C 7.9 (A) 12/08/2017   Follow-up on pericarditis, chest pain and dyspnea.  She cancelled her appt with cardiologist, and rescheduled for September. Treatment was supposed to be 2 weeks of NSAIDs and 3 months of colchicine.  Her ibuprofen dose was increased at her f/u visit 1 month ago, due to her still having pain.  Hypertension and hypokalemia:  Her HCTZ was stopped, and changed to spironolactone after her last visit.  She is tolerating this without side effects.  BP's have been running     PHYSICAL EXAM:  Wt Readings from Last 3 Encounters:  12/08/17 293 lb (132.9 kg)  12/02/17 296 lb (134.3 kg)  12/01/17 296 lb 4.8 oz (134.4 kg)     b-met Urine microalb, TSH and lipids also due (last lipids 04/2016)--fasting?

## 2018-01-12 ENCOUNTER — Telehealth: Payer: Self-pay | Admitting: *Deleted

## 2018-01-12 ENCOUNTER — Encounter: Payer: Self-pay | Admitting: Family Medicine

## 2018-01-12 NOTE — Telephone Encounter (Signed)

## 2018-01-12 NOTE — Telephone Encounter (Signed)
She needs a SECOND no-show letter sent. She is due for f/u, and probably should be rescheduled if she isn't seeking care elsewhere.

## 2018-01-19 ENCOUNTER — Encounter: Payer: Self-pay | Admitting: Family Medicine

## 2018-01-19 NOTE — Telephone Encounter (Signed)
No show letter has been sent, along with request to reschedule appointment.

## 2018-01-29 ENCOUNTER — Encounter: Payer: Self-pay | Admitting: Family Medicine

## 2018-01-30 ENCOUNTER — Encounter: Payer: Self-pay | Admitting: Cardiology

## 2018-01-30 ENCOUNTER — Ambulatory Visit (INDEPENDENT_AMBULATORY_CARE_PROVIDER_SITE_OTHER): Payer: 59 | Admitting: Cardiology

## 2018-01-30 VITALS — BP 130/92 | HR 75 | Ht 64.0 in | Wt 297.8 lb

## 2018-01-30 DIAGNOSIS — I300 Acute nonspecific idiopathic pericarditis: Secondary | ICD-10-CM | POA: Diagnosis not present

## 2018-01-30 MED ORDER — COLCHICINE 0.6 MG PO TABS: 0.6000 mg | ORAL_TABLET | Freq: Every day | ORAL | 2 refills | Status: DC
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
## 2018-01-30 NOTE — Patient Instructions (Signed)
Medication Instructions:  Your physician has recommended you make the following change in your medication:  1-START colchicine 0.6 mg by mouth twice daily  Labwork: NONE ordered today  Testing/Procedures: NONE ordered today  Follow-Up: Your physician recommends that you schedule a follow-up appointment in: 3 to 4 months with Dr. Meda Coffee.    If you need a refill on your cardiac medications before your next appointment, please call your pharmacy.

## 2018-02-02 NOTE — Progress Notes (Signed)
Left message asking patient to please call and schedule med check ASAP.

## 2018-02-04 NOTE — Progress Notes (Signed)
Left message asking patient to please call and schedule med. Will try again tomorrow.

## 2018-02-05 NOTE — Progress Notes (Signed)
Patient is going to call me back once she looks at her schedule with some dates and I will get her scheduled.

## 2018-03-12 ENCOUNTER — Other Ambulatory Visit: Payer: Self-pay | Admitting: Family Medicine

## 2018-03-12 ENCOUNTER — Ambulatory Visit: Payer: 59 | Admitting: Family Medicine

## 2018-03-12 ENCOUNTER — Encounter: Payer: Self-pay | Admitting: Family Medicine

## 2018-03-12 ENCOUNTER — Ambulatory Visit (HOSPITAL_COMMUNITY)
Admission: RE | Admit: 2018-03-12 | Discharge: 2018-03-12 | Disposition: A | Discharge status: Home / Self Care | Payer: 59 | Source: Ambulatory Visit | Attending: Family Medicine | Admitting: Family Medicine

## 2018-03-12 VITALS — BP 170/102 | HR 76 | Temp 98.4°F | Ht 64.0 in | Wt 300.6 lb

## 2018-03-12 DIAGNOSIS — E119 Type 2 diabetes mellitus without complications: Secondary | ICD-10-CM | POA: Diagnosis not present

## 2018-03-12 DIAGNOSIS — Z5181 Encounter for therapeutic drug level monitoring: Secondary | ICD-10-CM | POA: Diagnosis not present

## 2018-03-12 DIAGNOSIS — R109 Unspecified abdominal pain: Secondary | ICD-10-CM

## 2018-03-12 DIAGNOSIS — I5189 Other ill-defined heart diseases: Secondary | ICD-10-CM

## 2018-03-12 DIAGNOSIS — I10 Essential (primary) hypertension: Secondary | ICD-10-CM | POA: Diagnosis not present

## 2018-03-12 DIAGNOSIS — R102 Pelvic and perineal pain: Secondary | ICD-10-CM | POA: Diagnosis not present

## 2018-03-12 LAB — BASIC METABOLIC PANEL
BUN / CREAT RATIO: 16 (ref 9–23)
BUN: 10 mg/dL (ref 6–20)
CALCIUM: 9 mg/dL (ref 8.7–10.2)
CHLORIDE: 103 mmol/L (ref 96–106)
CO2: 23 mmol/L (ref 20–29)
CREATININE: 0.61 mg/dL (ref 0.57–1.00)
GFR calc Af Amer: 137 mL/min/{1.73_m2} (ref >59)
GFR calc non Af Amer: 119 mL/min/{1.73_m2} (ref >59)
GLUCOSE: 171 mg/dL — AB (ref 65–99)
Potassium: 3.6 mmol/L (ref 3.5–5.2)
Sodium: 142 mmol/L (ref 134–144)

## 2018-03-12 LAB — CBC WITH DIFFERENTIAL/PLATELET
BASOS ABS: 0 10*3/uL (ref 0.0–0.2)
Basos: 0 %
EOS (ABSOLUTE): 0.2 10*3/uL (ref 0.0–0.4)
Eos: 2 %
HEMOGLOBIN: 11.9 g/dL (ref 11.1–15.9)
Hematocrit: 34.6 % (ref 34.0–46.6)
IMMATURE GRANS (ABS): 0 10*3/uL (ref 0.0–0.1)
IMMATURE GRANULOCYTES: 0 %
LYMPHS: 36 %
Lymphocytes Absolute: 2.5 10*3/uL (ref 0.7–3.1)
MCH: 26.9 pg (ref 26.6–33.0)
MCHC: 34.4 g/dL (ref 31.5–35.7)
MCV: 78 fL — ABNORMAL LOW (ref 79–97)
MONOCYTES: 8 %
Monocytes Absolute: 0.5 10*3/uL (ref 0.1–0.9)
NEUTROS PCT: 54 %
Neutrophils Absolute: 3.8 10*3/uL (ref 1.4–7.0)
PLATELETS: 294 10*3/uL (ref 150–450)
RBC: 4.42 x10E6/uL (ref 3.77–5.28)
RDW: 14.7 % (ref 12.3–15.4)
WBC: 7 10*3/uL (ref 3.4–10.8)

## 2018-03-12 LAB — POCT URINALYSIS DIP (PROADVANTAGE DEVICE)
Bilirubin, UA: NEGATIVE
Blood, UA: NEGATIVE
GLUCOSE UA: NEGATIVE mg/dL
Ketones, POC UA: NEGATIVE mg/dL
LEUKOCYTES UA: NEGATIVE
Nitrite, UA: NEGATIVE
SPECIFIC GRAVITY, URINE: 1.02
UUROB: NEGATIVE
pH, UA: 6 (ref 5.0–8.0)

## 2018-03-12 LAB — POCT URINE PREGNANCY: PREG TEST UR: NEGATIVE

## 2018-03-12 MED ORDER — SPIRONOLACTONE 25 MG PO TABS: 25.0000 mg | ORAL_TABLET | Freq: Every day | ORAL | 0 refills | Status: DC

## 2018-03-12 MED FILL — AMLODIPINE BESYLATE 10 MG T: 10 | 30 days supply | Qty: 30 | Fill #5

## 2018-03-12 MED FILL — COLCHICINE 0.6 MG TABS: 0.6 | 60 days supply | Qty: 60 | Fill #0

## 2018-03-12 NOTE — Patient Instructions (Signed)
Your blood pressure is extremely high.  It is important to monitor elsewhere, as well as not run out of your medications. You were due to have bloodwork and followup 1-2 months ago.  At this point, I'm waiting for the stat labs to check the potassium before renewing the spironolactone.  You need to return here in the next 1-2 weeks to follow up on your blood pressure and diabetes.  We will not be refilling your chronic medications if you aren't coming for these important visits.  Today we discussed your pelvic pain.  We are arranging for an ultrasound to rule out ovarian cyst as cause for your pain.  Take 2 aleve twice daily with food. You can also use tylenol, along with the aleve, if needed for pain control.  Your blood pressure was VERY high today--probably partly due to pain, partly due to being out of medication for 2 days.  We will reassess this at the next visit, which must be within the next 2 weeks.

## 2018-03-12 NOTE — Progress Notes (Signed)
Chief Complaint  Patient presents with  . Abdominal Pain    lower abdominal pain x 3-4 days. Sharp pain on left side comes and goes. Lower abd pain is constant. Couldn't give UA.  . Medication Refill    there is a refill request for spironolactone in requests.    Patient presents with complaint of hurting in her entire lower abdomen/pelvis, more on the left.  Pain started 2 days ago, getting worse. Sunday she had a little pain, was spotting some. Period was 2 weeks prior.  She only saw the bleeding twice.  Pain progressively got worse. Feels worse after eating. Bowels are normal, last bowel movement was yesterday, soft, no blood or mucus.  She has been having some lower back pain bilaterally. No blood in urine. No dysuria, slight "weird feeling" after voiding, like a squeezing.  The pain in the left lower abdomen is described as stabbing, comes and goes.  Has more constant pressure in lower abdomen, feels better laying down in bed. Denies any prolapse. No abnormal vaginal discharge. She only got back on OCP's briefly after July hospitalization, but hasn't been good about remembering--no pills this month.  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 Willis Modena  Last seen here in July for hospital f/u from pericarditis, trying to get her in for med check since then. Was supposed to return 2 weeks later, after med change made (started on spironolactone).  She reports she has been out of this x 2 days (refill request received today).  She reports being very busy with work and school.  Diabetes--on metformin. Was supposed to start Bydureon B-cise but never did. Lab Results  Component Value Date   HGBA1C 7.9 (A) 12/08/2017     Pericarditis--supposed to be taking colchicine through early November, as recommended per last cardiology follow-up. To f/u Dr. Meda Coffee 3-4 mos  PMH, [place], [place] reviewed  Outpatient Encounter Medications as of  03/12/2018  Medication Sig  . amLODipine (NORVASC) 10 MG tablet Take 1 tablet (10 mg total) by mouth at bedtime.  . colchicine 0.6 MG tablet Take 1 tablet (0.6 mg total) by mouth daily.  Marland Kitchen ibuprofen (ADVIL,MOTRIN) 400 MG tablet Take 1 tablet (400 mg total) by mouth 2 (two) times daily.  . metFORMIN (GLUCOPHAGE) 500 MG tablet Take 1 tablet (500 mg total) by mouth 2 (two) times daily with a meal. Hold for next 2 days as you received IV contrast, and resume on 12/05/2017  . Multiple Vitamins-Minerals (WOMENS DAILY FORMULA PO) Take 1 tablet by mouth daily.  . potassium chloride (K-DUR,KLOR-CON) 10 MEQ tablet Take 1 tablet by mouth daily.  Marland Kitchen spironolactone (ALDACTONE) 25 MG tablet Take 1 tablet (25 mg total) by mouth daily.  . [DISCONTINUED] spironolactone (ALDACTONE) 25 MG tablet Take 1 tablet (25 mg total) by mouth daily.  Marland Kitchen albuterol (PROVENTIL HFA;VENTOLIN HFA) 108 (90 Base) MCG/ACT inhaler Inhale 2 puffs into the lungs every 6 (six) hours as needed for wheezing or shortness of breath. (Patient not taking: Reported on 03/12/2018)  . norethindrone (MICRONOR,CAMILA,ERRIN) 0.35 MG tablet Take 1 tablet by mouth daily.   No facility-administered encounter medications on file as of 03/12/2018.    Allergies  Allergen Reactions  . Dilaudid [Hydromorphone Hcl] Hives  . Morphine And Related Hives  . Peanut-Containing Drug Products Hives  . Strawberry Extract Swelling    Swelling is of the eye.    ROS:  No fever, chills, nausea,  vomiting, bowel changes.  +abdominal pain per HPI.  No URI symptoms, chest pain, shortness of breath.   Current pain level 6/10. No headaches, dizziness. No bleeding, bruising, rash See HPI   PHYSICAL EXAM:  BP (!) 170/102   Pulse 76   Temp 98.4 F (36.9 C) (Tympanic)   Ht 5' 4"  (1.626 m)   Wt (!) 300 lb 9.6 oz (136.4 kg)   LMP 02/25/2018 (Approximate)   BMI 51.60 kg/m   Wt Readings from Last 3 Encounters:  03/12/18 (!) 300 lb 9.6 oz (136.4 kg)  01/30/18 297  lb 12.8 oz (135.1 kg)  12/08/17 293 lb (132.9 kg)    174/120 by MD  Obese female, in mild-mod discomfort, worse with movements HEENT: PERRL, EOMI, conjunctiva and sclera are clear, OP clear, moist mucus membranes Neck: No lymphadenopathy or mass Heart: regular rate and rhythm Lungs: clear bilaterally Back: no spinal or CVA tenderness.  Area of discomfort is lower back, paraspinous muscles. Abdomen: obese.  No epigastric tenderness, negative Murphy.  She is diffusely tender along suprapubic area and LLQ.  No mass, rebound tenderness or guarding.  Skin below pannus is normal, no rash Pelvic: normal external GU. Body habitus limits exam. She is tender over L adnexa, no mass appreciable.  No CMT or masses Rectal: normal sphincter tone.  No stool in vault Skin: normal turgor, no rash Neuro: alert and oriented, cranial nerves intact, normal gai  Urine pregnancy negative Urine dip: trace protein, no blood or leuks    ASSESSMENT/PLAN:  Abdominal pain, unspecified abdominal location - suspect possible ovarian cyst. check [place], f/u with GYN.  Seek care if fever, worsening pain. Trial NSAIDs--2 Aleve BID with food - Plan: POCT Urinalysis DIP (Proadvantage Device), POCT urine pregnancy, CBC with Differential/Platelet, US Transvaginal Non-OB, US Pelvis Complete, CANCELED: US Transvaginal Non-OB, CANCELED: US Pelvis Complete  Medication monitoring encounter - Plan: Basic metabolic panel  Essential hypertension - very high today, noncompliant with meds, plus pain contributing. restart meds, f/u 2 wks - Plan: Basic metabolic panel, spironolactone (ALDACTONE) 25 MG tablet  Diastolic dysfunction - Plan: spironolactone (ALDACTONE) 25 MG tablet  Type 2 diabetes mellitus not at goal [facility]) - noncompliant with f/u or med recs. to f/u for med check in 2 wks (A1c at visit)    b-met, CBC STAT Refill spironolactone after labs back

## 2018-03-13 ENCOUNTER — Encounter: Payer: Self-pay | Admitting: Family Medicine

## 2018-03-21 ENCOUNTER — Telehealth: Payer: 59 | Admitting: Nurse Practitioner

## 2018-03-21 DIAGNOSIS — H00012 Hordeolum externum right lower eyelid: Secondary | ICD-10-CM

## 2018-03-21 MED ORDER — POLYMYXIN B-TRIMETHOPRIM 10000-0.1 UNIT/ML-% OP SOLN: 2.0000 [drp] | OPHTHALMIC | 0 refills | Status: DC

## 2018-03-21 NOTE — Progress Notes (Signed)
We are sorry that you are not feeling well. Here is how we plan to help!  Based on what you have shared with me it looks like you have a stye.  A stye is an inflammation of the eyelid.  It is often a red, painful lump near the edge of the eyelid that may look like a boil or a pimple.  A stye develops when an infection occurs at the base of an eyelash.   We have made appropriate suggestions for you based upon your presentation: Your symptoms may indicate an infection of the sclera.  The use of anti-inflammatory and antibiotic eye drops for a week will help resolve this condition.  I have sent in neomycin-polymyxin HC opthalmic suspension, two to three drops in the affected eye every 4 hours.  If your symptoms do not improve over the next two to three days you should be seen in your doctor's office.  HOME CARE:   Wash your hands often!  Let the stye open on its own. Don't squeeze or open it.  Don't rub your eyes. This can irritate your eyes and let in bacteria.  If you need to touch your eyes, wash your hands first.  Don't wear eye makeup or contact lenses until the area has healed.  GET HELP RIGHT AWAY IF:   Your symptoms do not improve.  You develop blurred or loss of vision.  Your symptoms worsen (increased discharge, pain or redness).  Thank you for choosing an e-visit.  Your e-visit answers were reviewed by a board certified advanced clinical practitioner to complete your personal care plan.  Depending upon the condition, your plan could have included both over the counter or prescription medications.  Please review your pharmacy choice.  Make sure the pharmacy is open so you can pick up prescription now.  If there is a problem, you may contact your provider through MyChart messaging and have the prescription routed to another pharmacy.    Your safety is important to us.  If you have drug allergies check your prescription carefully.  For the next 24 hours you can use MyChart to  ask questions about today's visit, request a non-urgent call back, or ask for a work or school excuse.  You will get an email in the next two days asking about your experience.  I hope you that your e-visit has been valuable and will speed your recovery.      

## 2018-03-30 ENCOUNTER — Other Ambulatory Visit: Payer: Self-pay | Admitting: Family Medicine

## 2018-03-30 MED FILL — SPIRONOLACTONE 25 MG TABLET: 25 | 30 days supply | Qty: 30 | Fill #0

## 2018-04-05 ENCOUNTER — Inpatient Hospital Stay (HOSPITAL_COMMUNITY)
Admission: AD | Admit: 2018-04-05 | Discharge: 2018-04-05 | Disposition: A | Discharge status: Home / Self Care | Payer: 59 | Source: Ambulatory Visit | Attending: Obstetrics and Gynecology | Admitting: Obstetrics and Gynecology

## 2018-04-05 ENCOUNTER — Other Ambulatory Visit: Payer: Self-pay

## 2018-04-05 ENCOUNTER — Encounter (HOSPITAL_COMMUNITY): Payer: Self-pay

## 2018-04-05 DIAGNOSIS — R42 Dizziness and giddiness: Secondary | ICD-10-CM

## 2018-04-05 DIAGNOSIS — R102 Pelvic and perineal pain: Secondary | ICD-10-CM

## 2018-04-05 DIAGNOSIS — R55 Syncope and collapse: Secondary | ICD-10-CM | POA: Insufficient documentation

## 2018-04-05 DIAGNOSIS — E119 Type 2 diabetes mellitus without complications: Secondary | ICD-10-CM | POA: Diagnosis not present

## 2018-04-05 DIAGNOSIS — Z3202 Encounter for pregnancy test, result negative: Secondary | ICD-10-CM | POA: Diagnosis not present

## 2018-04-05 DIAGNOSIS — N390 Urinary tract infection, site not specified: Secondary | ICD-10-CM | POA: Insufficient documentation

## 2018-04-05 LAB — URINALYSIS, ROUTINE W REFLEX MICROSCOPIC
BILIRUBIN URINE: NEGATIVE
GLUCOSE, UA: NEGATIVE mg/dL
HGB URINE DIPSTICK: NEGATIVE
Ketones, ur: NEGATIVE mg/dL
LEUKOCYTES UA: NEGATIVE
NITRITE: POSITIVE — AB
Protein, ur: 100 mg/dL — AB
SPECIFIC GRAVITY, URINE: 1.021 (ref 1.005–1.030)
pH: 6 (ref 5.0–8.0)

## 2018-04-05 LAB — POCT PREGNANCY, URINE: Preg Test, Ur: NEGATIVE

## 2018-04-05 LAB — GLUCOSE, CAPILLARY: GLUCOSE-CAPILLARY: 167 mg/dL — AB (ref 70–99)

## 2018-04-05 MED ORDER — PROMETHAZINE HCL 25 MG PO TABS
25.0000 mg | ORAL_TABLET | Freq: Once | ORAL | Status: AC
  Administered 2018-04-05: 25 mg via ORAL
  Filled 2018-04-05: qty 1

## 2018-04-05 MED ORDER — LACTATED RINGERS IV SOLN
INTRAVENOUS | Status: DC
  Administered 2018-04-05: 13:00:00 via INTRAVENOUS

## 2018-04-05 MED ORDER — TRAMADOL HCL 50 MG PO TABS: 50.0000 mg | ORAL_TABLET | Freq: Four times a day (QID) | ORAL | 0 refills | Status: DC | PRN

## 2018-04-05 MED ORDER — SULFAMETHOXAZOLE-TRIMETHOPRIM 800-160 MG PO TABS: 1.0000 | ORAL_TABLET | Freq: Two times a day (BID) | ORAL | 0 refills | Status: DC

## 2018-04-05 MED ORDER — KETOROLAC TROMETHAMINE 60 MG/2ML IM SOLN
60.0000 mg | INTRAMUSCULAR | Status: AC
  Administered 2018-04-05: 60 mg via INTRAMUSCULAR
  Filled 2018-04-05: qty 2

## 2018-04-05 NOTE — MAU Note (Signed)
Pt presents to MAU with c/o dizzness, light pink spotting and nausea that started today dizziness has gotten worse today. She has had lower abdominal pain x 1 month that has increased in intensity today. She has felt a swollen gland on left side of neck x 1 day that is painful to touch.
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
## 2018-04-06 LAB — URINE CULTURE
Culture: NO GROWTH
Special Requests: NORMAL

## 2018-04-07 ENCOUNTER — Emergency Department (HOSPITAL_COMMUNITY): Payer: 59

## 2018-04-07 ENCOUNTER — Emergency Department (HOSPITAL_COMMUNITY)
Admission: EM | Admit: 2018-04-07 | Discharge: 2018-04-07 | Disposition: A | Discharge status: Home / Self Care | Payer: 59 | Attending: Emergency Medicine | Admitting: Emergency Medicine

## 2018-04-07 ENCOUNTER — Encounter (HOSPITAL_COMMUNITY): Payer: Self-pay

## 2018-04-07 ENCOUNTER — Other Ambulatory Visit: Payer: Self-pay

## 2018-04-07 DIAGNOSIS — Z79899 Other long term (current) drug therapy: Secondary | ICD-10-CM | POA: Diagnosis not present

## 2018-04-07 DIAGNOSIS — R51 Headache: Secondary | ICD-10-CM | POA: Diagnosis not present

## 2018-04-07 DIAGNOSIS — R221 Localized swelling, mass and lump, neck: Secondary | ICD-10-CM | POA: Diagnosis not present

## 2018-04-07 DIAGNOSIS — I10 Essential (primary) hypertension: Secondary | ICD-10-CM | POA: Insufficient documentation

## 2018-04-07 DIAGNOSIS — E119 Type 2 diabetes mellitus without complications: Secondary | ICD-10-CM | POA: Diagnosis not present

## 2018-04-07 DIAGNOSIS — J45909 Unspecified asthma, uncomplicated: Secondary | ICD-10-CM | POA: Diagnosis not present

## 2018-04-07 DIAGNOSIS — E1165 Type 2 diabetes mellitus with hyperglycemia: Secondary | ICD-10-CM | POA: Diagnosis not present

## 2018-04-07 DIAGNOSIS — Z7984 Long term (current) use of oral hypoglycemic drugs: Secondary | ICD-10-CM | POA: Insufficient documentation

## 2018-04-07 DIAGNOSIS — R52 Pain, unspecified: Secondary | ICD-10-CM | POA: Diagnosis not present

## 2018-04-07 DIAGNOSIS — R42 Dizziness and giddiness: Secondary | ICD-10-CM

## 2018-04-07 DIAGNOSIS — R590 Localized enlarged lymph nodes: Secondary | ICD-10-CM

## 2018-04-07 DIAGNOSIS — G4489 Other headache syndrome: Secondary | ICD-10-CM | POA: Diagnosis not present

## 2018-04-07 DIAGNOSIS — M542 Cervicalgia: Secondary | ICD-10-CM | POA: Diagnosis not present

## 2018-04-07 LAB — BASIC METABOLIC PANEL
Anion gap: 9 (ref 5–15)
BUN: 6 mg/dL (ref 6–20)
CHLORIDE: 101 mmol/L (ref 98–111)
CO2: 24 mmol/L (ref 22–32)
CREATININE: 0.82 mg/dL (ref 0.44–1.00)
Calcium: 8.8 mg/dL — ABNORMAL LOW (ref 8.9–10.3)
GFR calc Af Amer: >60 mL/min (ref >60)
GFR calc non Af Amer: >60 mL/min (ref >60)
Glucose, Bld: 275 mg/dL — ABNORMAL HIGH (ref 70–99)
Potassium: 2.7 mmol/L — CL (ref 3.5–5.1)
SODIUM: 134 mmol/L — AB (ref 135–145)

## 2018-04-07 LAB — CBC WITH DIFFERENTIAL/PLATELET
Abs Immature Granulocytes: 0.05 10*3/uL (ref 0.00–0.07)
BASOS PCT: 0 %
Basophils Absolute: 0 10*3/uL (ref 0.0–0.1)
EOS ABS: 0 10*3/uL (ref 0.0–0.5)
EOS PCT: 0 %
HCT: 38.5 % (ref 36.0–46.0)
HEMOGLOBIN: 12.3 g/dL (ref 12.0–15.0)
Immature Granulocytes: 1 %
LYMPHS PCT: 18 %
Lymphs Abs: 1 10*3/uL (ref 0.7–4.0)
MCH: 25.8 pg — AB (ref 26.0–34.0)
MCHC: 31.9 g/dL (ref 30.0–36.0)
MCV: 80.7 fL (ref 80.0–100.0)
MONO ABS: 0.6 10*3/uL (ref 0.1–1.0)
Monocytes Relative: 11 %
NEUTROS ABS: 3.8 10*3/uL (ref 1.7–7.7)
NEUTROS PCT: 70 %
Platelets: 225 10*3/uL (ref 150–400)
RBC: 4.77 MIL/uL (ref 3.87–5.11)
RDW: 13.8 % (ref 11.5–15.5)
WBC: 5.5 10*3/uL (ref 4.0–10.5)
nRBC: 0 % (ref 0.0–0.2)

## 2018-04-07 LAB — I-STAT BETA HCG BLOOD, ED (MC, WL, AP ONLY)

## 2018-04-07 LAB — GROUP A STREP BY PCR: GROUP A STREP BY PCR: NOT DETECTED

## 2018-04-07 MED ORDER — MECLIZINE HCL 25 MG PO TABS
25.0000 mg | ORAL_TABLET | Freq: Once | ORAL | Status: AC
  Administered 2018-04-07: 25 mg via ORAL
  Filled 2018-04-07: qty 1

## 2018-04-07 MED ORDER — SODIUM CHLORIDE 0.9 % IV BOLUS
1000.0000 mL | Freq: Once | INTRAVENOUS | Status: AC
  Administered 2018-04-07: 1000 mL via INTRAVENOUS

## 2018-04-07 MED ORDER — DIPHENHYDRAMINE HCL 50 MG/ML IJ SOLN
25.0000 mg | Freq: Once | INTRAMUSCULAR | Status: AC
  Administered 2018-04-07: 25 mg via INTRAVENOUS
  Filled 2018-04-07: qty 1

## 2018-04-07 MED ORDER — POTASSIUM CHLORIDE CRYS ER 20 MEQ PO TBCR
40.0000 meq | EXTENDED_RELEASE_TABLET | Freq: Once | ORAL | Status: AC
  Administered 2018-04-07: 40 meq via ORAL
  Filled 2018-04-07: qty 2

## 2018-04-07 MED ORDER — METOCLOPRAMIDE HCL 5 MG/ML IJ SOLN
10.0000 mg | Freq: Once | INTRAMUSCULAR | Status: AC
  Administered 2018-04-07: 10 mg via INTRAVENOUS
  Filled 2018-04-07: qty 2

## 2018-04-07 MED ORDER — DIAZEPAM 5 MG PO TABS
5.0000 mg | ORAL_TABLET | Freq: Once | ORAL | Status: DC
  Filled 2018-04-07: qty 1

## 2018-04-07 MED ORDER — DEXAMETHASONE SODIUM PHOSPHATE 10 MG/ML IJ SOLN
10.0000 mg | Freq: Once | INTRAMUSCULAR | Status: AC
  Administered 2018-04-07: 10 mg via INTRAVENOUS
  Filled 2018-04-07: qty 1

## 2018-04-07 MED ORDER — IOHEXOL 300 MG/ML  SOLN
75.0000 mL | Freq: Once | INTRAMUSCULAR | Status: AC | PRN
  Administered 2018-04-07: 75 mL via INTRAVENOUS

## 2018-04-07 MED ORDER — LORAZEPAM 2 MG/ML IJ SOLN
1.0000 mg | Freq: Once | INTRAMUSCULAR | Status: AC
  Administered 2018-04-07: 1 mg via INTRAVENOUS
  Filled 2018-04-07: qty 1

## 2018-04-07 MED ORDER — MECLIZINE HCL 25 MG PO TABS: 25.0000 mg | ORAL_TABLET | Freq: Three times a day (TID) | ORAL | 0 refills | Status: DC | PRN

## 2018-04-07 MED ORDER — KETOROLAC TROMETHAMINE 30 MG/ML IJ SOLN
30.0000 mg | Freq: Once | INTRAMUSCULAR | Status: AC
  Administered 2018-04-07: 30 mg via INTRAVENOUS
  Filled 2018-04-07: qty 1

## 2018-04-07 MED FILL — SULFAMETHOXAZOLE-TMP DS TAB: 800-160 | 3 days supply | Qty: 6 | Fill #0

## 2018-04-07 MED FILL — traMADol HCL 50 MG TABS: 50 | 5 days supply | Qty: 20 | Fill #0

## 2018-04-07 NOTE — ED Triage Notes (Signed)
Pt BIB [facility] for eval of dizziness and nausea. Onset 0640 Sunday AM while exiting her car for work. Pt also endorses HA. [facility] was determined to have [facility]TI there. Pt reports they were going to transfer her here, but ultimately decided not to, pt unsure as to why. Pt also reports lump to L side of neck that she palpated on Saturday. Pt has hx of pericarditis.

## 2018-04-07 NOTE — ED Notes (Signed)
Dr. Betsey Holiday ( EDP ) notified on pt.'s hypokalemia.

## 2018-04-07 NOTE — ED Notes (Signed)
Patient transported to MRI 

## 2018-04-07 NOTE — ED Provider Notes (Signed)
Care assumed from Dr. Betsey Holiday at shift change.  Patient presented here with complaints of headache and dizziness that sounds vertiginous in nature.  She did not respond to medications initially given prior to my assuming care of the patient.  Was signed out to me awaiting an MRI of her brain to rule out posterior circulation stroke.  This was obtained and was negative.  Patient was reevaluated and reported continued headache and dizziness.  She was then given normal saline followed by a migraine cocktail and seems to be feeling better now.  She was ambulated in the hall and is now feeling better.  At this point, I feel as though discharge is appropriate.  She will be prescribed meclizine if her dizziness returns.  She is to return if symptoms worsen or change.   Veryl Speak, MD 04/07/18 [phone]

## 2018-04-07 NOTE — ED Notes (Signed)
Pt ambulatory with staff. Reported dizziness, denied nausea. MD aware.

## 2018-04-07 NOTE — Discharge Instructions (Signed)
Meclizine as prescribed as needed for dizziness.  Get plenty of rest and drink plenty of fluids.  Follow-up with your lymph node and current symptoms with your primary doctor in the next 1 to 2 weeks, and return to the emergency department if symptoms significantly worsen or change in the meantime.
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
## 2018-04-07 NOTE — ED Notes (Signed)
Patient transported to CT scan . 

## 2018-04-07 NOTE — ED Notes (Addendum)
Patient refused Valium EDP notified.

## 2018-04-08 ENCOUNTER — Inpatient Hospital Stay: Payer: 59 | Admitting: Family Medicine

## 2018-04-08 NOTE — Progress Notes (Signed)
Chief Complaint  Patient presents with  . Follow-up    ER follow up.    Patient presents for ER follow-up. See detailed information from both recent visits below. Today she is accompanied by her husband and two young children.  She has had persistent dizziness since Sunday.  She is functional, feels "swimmy".  Feels worse sitting up, feels better laying down with the lights off.  Meclizine in the hospital made her sleepy, hasn't taken any of the prescription she was given. She is also taking tramadol--for the headache (pelvic pain is improved), which is also making her sleepy, so didn't also take meclizine. She is still having pain at her left jaw/neck.  Still having sore throat.  Had low grade fever in hospital.  No fevers since home.  Headache is across the forehead, and posteriorly, and the pain at left jaw/neck. No nausea (just related to moving quickly, related to vertigo), no vomiting. No diarrhea.  She went to ER very early on 11/12. Went to ER after having sore throat, swollen glands, when she developed vertigo, nausea and headache. Evaluation included CT and MRI, which was done after not responding to medications (to r/o stroke). They had given meclizine and ativan without benefit  After getting MRI results, given saline and migraine cocktail, and pt improved.  Lab eval included chem panel which was notable for hypokalemia, K+ 2.7.     Chemistry      Component Value Date/Time   NA 134 (L) 04/07/2018 0207   NA 142 03/12/2018 1331   K 2.7 (LL) 04/07/2018 0207   CL 101 04/07/2018 0207   CO2 24 04/07/2018 0207   BUN 6 04/07/2018 0207   BUN 10 03/12/2018 1331   CREATININE 0.82 04/07/2018 0207   CREATININE 0.57 04/30/2017 1340      Component Value Date/Time   CALCIUM 8.8 (L) 04/07/2018 0207   ALKPHOS 66 12/08/2017 1220   AST 12 12/08/2017 1220   ALT 15 12/08/2017 1220   BILITOT 0.2 12/08/2017 1220    Glucose was 275   Lab Results  Component Value Date   WBC 5.5 04/07/2018    HGB 12.3 04/07/2018   HCT 38.5 04/07/2018   MCV 80.7 04/07/2018   PLT 225 04/07/2018   Strep test was negative Pregnancy test was negative Head CT: Negative head CT.  No acute intracranial abnormality identified. CT Neck: IMPRESSION: 1. Enlarged left-sided cervical adenopathy as above, accounting for the palpable abnormality of concern. Findings are nonspecific, and could be reactive in nature due to either and infectious or inflammatory process. Possible nodal metastases or lymphoma could also be considered. Correlation with history suggested. Additionally, correlation with histologic sampling may be helpful for further evaluation as clinically warranted. 2. Slight asymmetry within the palatine tonsils with the left larger than the right. No discrete mass identified by CT. Correlation with direct visualization and physical exam recommended. No other mass lesion along the visualized aero digestive tract. 3. Poor dentition without acute inflammatory changes. Nonemergent outpatient dental referral recommended.  (she dental surgery scheduled for next month, to pull wisdom teeth, and 3 teeth upper left jaw, on on the lower).  MRI of brain: IMPRESSION: 1.  No acute intracranial abnormality. 2. Mildly advanced for age nonspecific left frontal lobe white matter signal changes. Differential considerations include accelerated small vessel ischemia, sequelae of trauma, hypercoagulable state, vasculitis, migraines, prior infection or less likely demyelination. 3. Left cervical lymphadenopathy re-demonstrated.  She had also gone to [facility] ER on 11/10 with complaints  of: dizziness, nausea, pelvic pain (x 1 month - known ovarian cysts), light pink vaginal spotting, and a painful, swollen gland on the side of her neck. that started that morning. She had cath u/a Treated with IV fluids, phenergan, toradol. She was prescribed Bactrim x 3d to treat UTI, as well as tramadol She is to f/u  with Dr. Willis Modena in 2 weeks for pelvic pain  U/a showed protein, +nitrite, neg leuks. Urine culture showed no growth  Last seen by me 10/17 with acute abdominal pain, spotting.  Hadn't been compliant with OCP's.   [place] 10/17 showed: IMPRESSION: 1. The uterus and endometrium are normal. 2. Follicles in both ovaries. 3. The 1.4 cm hyperechoic region in the right ovary may represent a collapsing corpus luteum cyst. There is no fat in the right ovary on the previous CT scan to suggest a dermoid.  She has not yet seen GYN in follow-up for this pain. Pelvic pain comes and goes. Pain last month was 2 weeks after her period. This happened again 11/10--crampy pain on the left side restarted, same as last month.  It had resolved between episodes, and is not present today.  BP had been very high at her last visit here--had been out of spironolactone x 2d. She was supposed to resume med, and f/u in 2 weeks (didn't end up scheduling appt until the end of November, 6 weeks after visit, rather than 2). She continues to take her spironolactone, as well as potassium supplement (from when she was on her HCTZ).  DM--on metformin, never started the Bydureon B-cise that was recommended.  She took Trulicity until the middle of June.  It caused nausea and "sour belch", which she could deal with, as well as some abdominal pain.  She recalls that she "was living on the pink stuff (pepto bismol), sour stomach, wasn't eating much". She has never been on other diabetes meds other than the metformin and trulicity. Has never taken onglyza/januvia/tradjenta or farxiga/jardiance/invokana.  Lab Results  Component Value Date   HGBA1C 7.9 (A) 12/08/2017   PMH, PSH, SH reviewed  Outpatient Encounter Medications as of 04/09/2018  Medication Sig  . amLODipine (NORVASC) 10 MG tablet Take 1 tablet (10 mg total) by mouth at bedtime.  . colchicine 0.6 MG tablet Take 1 tablet (0.6 mg total) by mouth daily.  Marland Kitchen FREESTYLE LITE  test strip USE AS DIRECTED 2 (TWO) TIMES DAILY AFTER A MEAL  . metFORMIN (GLUCOPHAGE) 500 MG tablet Take 1 tablet (500 mg total) by mouth 2 (two) times daily with a meal. Hold for next 2 days as you received IV contrast, and resume on 12/05/2017 (Patient taking differently: Take 500 mg by mouth 2 (two) times daily with a meal. )  . potassium chloride (K-DUR,KLOR-CON) 10 MEQ tablet Take 1 tablet by mouth daily.  Marland Kitchen spironolactone (ALDACTONE) 25 MG tablet Take 1 tablet (25 mg total) by mouth daily.  Marland Kitchen sulfamethoxazole-trimethoprim (BACTRIM DS,SEPTRA DS) 800-160 MG tablet Take 1 tablet by mouth 2 (two) times daily.  . traMADol (ULTRAM) 50 MG tablet Take 1 tablet (50 mg total) by mouth every 6 (six) hours as needed.  Marland Kitchen albuterol (PROVENTIL HFA;VENTOLIN HFA) 108 (90 Base) MCG/ACT inhaler Inhale 2 puffs into the lungs every 6 (six) hours as needed for wheezing or shortness of breath. (Patient not taking: Reported on 04/09/2018)  . ibuprofen (ADVIL,MOTRIN) 400 MG tablet Take 1 tablet (400 mg total) by mouth 2 (two) times daily. (Patient not taking: Reported on 04/09/2018)  .  meclizine (ANTIVERT) 25 MG tablet Take 1 tablet (25 mg total) by mouth 3 (three) times daily as needed for dizziness. (Patient not taking: Reported on 04/09/2018)  . Multiple Vitamins-Minerals (WOMENS DAILY FORMULA PO) Take 1 tablet by mouth daily.  . norethindrone (MICRONOR,CAMILA,ERRIN) 0.35 MG tablet Take 1 tablet by mouth daily.  . [DISCONTINUED] trimethoprim-polymyxin b (POLYTRIM) ophthalmic solution Place 2 drops into both eyes every 4 (four) hours. (Patient not taking: Reported on 04/07/2018)   No facility-administered encounter medications on file as of 04/09/2018.    Allergies  Allergen Reactions  . Dilaudid [Hydromorphone Hcl] Hives  . Morphine And Related Hives  . Peanut-Containing Drug Products Hives  . Strawberry Extract Swelling    Swelling is of the eye.   ROS: no further fever, no chills.  No vomiting, diarrhea.   Pelvic pain resolved.  No urinary complaints, no vaginal discharge. No URI symptoms, but has sore throat, swollen gland per HPI.  Nausea only related to vertigo, which persists, mild.  +headache. No bleeding, bruising, rash. No chest pain, shortness of breath.   PHYSICAL EXAM:  BP 118/70   Pulse 72   Ht 5\' 4"  (1.626 m)   Wt 293 lb (132.9 kg)   LMP 03/23/2018 (Approximate)   BMI 50.29 kg/m   Wt Readings from Last 3 Encounters:  04/07/18 295 lb (133.8 kg)  04/05/18 295 lb (133.8 kg)  03/12/18 (!) 300 lb 9.6 oz (136.4 kg)   Pleasant, well-appearing obese female, in wheelchair (due to feeling woozy).  HEENT: PERRL EOMI, conjunctiva and sclera are clear. TM's and EAC's normal.  Nasal mucosa is mildly edematous bilaterally, no erythema or purulence. Tender L>R frontal and max sinuses OP--posterior is normal Significant decay and erythema on superior left jaw/teeth Neck: -+enlarged, tender left anterior cervical node.  Also asymmetry and swelling and tender at the left neck below the ear, tender along the muscle. No other focal adenopathy On right anterior neck there is a mobile, soft mass, nontender (?LN, very mobile, NT) Heart: regular rate and rhythm Lungs: clear bilaterally Abdomen: soft, obese, nontender, no mass Extremities: no edema Neuro: alert and oriented, cranial nerves intact. No nystagmus.  Gait not assessed. Skin: normal turgor, no rash  Lab Results  Component Value Date   HGBA1C 9.0 (A) 04/09/2018    ASSESSMENT/PLAN:  Vertigo - cont meclizine prn, not as severe as when in ER. Negative w/u--? if related to infection in mouth  Left cervical lymphadenopathy - suspect related to dental infection, treat with PCN and f/u for extractions as planned  Hypokalemia - noted in ER, without any vomiting or diarrhea; surprising since on supplement and K+ sparing diuretic.  Was replaced in ER, recheck today - Plan: Basic metabolic panel  Essential hypertension - Very high in ER,  when had headache. Normal today. Cont current regimen  Type 2 diabetes mellitus not at goal [facility]) - poorly controlled. Increase metformin to 1000mg  BID (over a few days), then start Farxiga. Risks/SE reviewed, risks of poorly controlled DM discussed - Plan: HgB A1c, metFORMIN (GLUCOPHAGE) 1000 MG tablet, dapagliflozin propanediol (FARXIGA) 10 MG TABS tablet  Dental infection - treat with PCN, f/u with dentist for extractions as planned - Plan: penicillin v potassium (VEETID) 500 MG tablet  Pelvic pain - resolved, occuring 2 wks after cycle, ?mittelschmerz.  Needs to get back on OCP's/contraception.  f/u with Dr. Willis Modena as recommended    I think your tooth decay is contributing to your pain, likely also have an infection (affecting the  glands).  We are going to treat this with penicillin. Get your extractions/surgery as planned. I think some of your headache is from sinus vs allergies--okay to take Coricidin HBP.  Use the tramadol sparingly as needed for severe headache. You may use meclizine if needed for vertigo--you can try 1/2 tablet.   Increase your metformin--start by taking 2 in the morning and 1 at night for a day or two. If no GI side effects, then increase to 2 pills twice daily and then fill the new prescription for 1000mg  tablets, taking 1 twice daily. Once you are on the 1000mg  twice daily, and tolerating it (1 week), then plan to start the Farxiga (or Jardiance--either one is fine, we'll see if your insurance has a preference). This medication is once daily. Let us or Dr. Willis Modena know if you develop vaginal itching/discharge that would indicate a yeast infection. Continue monitor blood sugars regularly (fasting, in the morning, and a few times/week check it before bedtime, or at least 2 hours after a meal). Bring your list of sugars to your next visit.

## 2018-04-09 ENCOUNTER — Ambulatory Visit (INDEPENDENT_AMBULATORY_CARE_PROVIDER_SITE_OTHER): Payer: 59 | Admitting: Family Medicine

## 2018-04-09 ENCOUNTER — Encounter: Payer: Self-pay | Admitting: Family Medicine

## 2018-04-09 VITALS — BP 118/70 | HR 72 | Ht 64.0 in | Wt 293.0 lb

## 2018-04-09 DIAGNOSIS — R590 Localized enlarged lymph nodes: Secondary | ICD-10-CM | POA: Diagnosis not present

## 2018-04-09 DIAGNOSIS — R102 Pelvic and perineal pain unspecified side: Secondary | ICD-10-CM

## 2018-04-09 DIAGNOSIS — E876 Hypokalemia: Secondary | ICD-10-CM | POA: Diagnosis not present

## 2018-04-09 DIAGNOSIS — R42 Dizziness and giddiness: Secondary | ICD-10-CM

## 2018-04-09 DIAGNOSIS — I10 Essential (primary) hypertension: Secondary | ICD-10-CM | POA: Diagnosis not present

## 2018-04-09 DIAGNOSIS — K047 Periapical abscess without sinus: Secondary | ICD-10-CM | POA: Diagnosis not present

## 2018-04-09 DIAGNOSIS — E119 Type 2 diabetes mellitus without complications: Secondary | ICD-10-CM

## 2018-04-09 LAB — POCT GLYCOSYLATED HEMOGLOBIN (HGB A1C): Hemoglobin A1C: 9 % — AB (ref 4.0–5.6)

## 2018-04-09 MED ORDER — DAPAGLIFLOZIN PROPANEDIOL 10 MG PO TABS: 10.0000 mg | ORAL_TABLET | Freq: Every day | ORAL | 3 refills | Status: DC

## 2018-04-09 MED ORDER — METFORMIN HCL 1000 MG PO TABS: 1000.0000 mg | ORAL_TABLET | Freq: Two times a day (BID) | ORAL | 1 refills | Status: DC

## 2018-04-09 MED ORDER — PENICILLIN V POTASSIUM 500 MG PO TABS: 500.0000 mg | ORAL_TABLET | Freq: Four times a day (QID) | ORAL | 0 refills | Status: DC

## 2018-04-09 MED FILL — metFORMIN HCL 1000 MG TABS: 1000 | 90 days supply | Qty: 180 | Fill #0

## 2018-04-09 MED FILL — FREESTYLE LITE TEST STRIP: 50 days supply | Qty: 100 | Fill #0

## 2018-04-09 MED FILL — PENICILLIN VK 500 MG TABLET: 500 | 10 days supply | Qty: 40 | Fill #0

## 2018-04-10 ENCOUNTER — Encounter: Payer: Self-pay | Admitting: Family Medicine

## 2018-04-10 LAB — BASIC METABOLIC PANEL
BUN / CREAT RATIO: 17 (ref 9–23)
BUN: 11 mg/dL (ref 6–20)
CO2: 19 mmol/L — AB (ref 20–29)
Calcium: 9 mg/dL (ref 8.7–10.2)
Chloride: 102 mmol/L (ref 96–106)
Creatinine, Ser: 0.65 mg/dL (ref 0.57–1.00)
GFR calc Af Amer: 134 mL/min/{1.73_m2} (ref >59)
GFR, EST NON AFRICAN AMERICAN: 116 mL/min/{1.73_m2} (ref >59)
Glucose: 173 mg/dL — ABNORMAL HIGH (ref 65–99)
POTASSIUM: 3.7 mmol/L (ref 3.5–5.2)
Sodium: 138 mmol/L (ref 134–144)

## 2018-04-21 NOTE — Progress Notes (Deleted)
  Patient presents for f/u on diabetes, blood pressure She was seen 2 weeks ago at which time she was having vertigo, and was also diagnosed with dental infection, treated with Pen VK. She had been having severe headaches as well (related to infection, and perhaps allergies/sinuses as well).  Her diabetes was poorly controlled.  Last A1c was 9%. We had her titrate up her Metformin up to 1000mg  BID, with the plan to then start [name] once tolerating the higher dose of metformin. She is currently taking Sugars are running  BP's at home have been running  She had been advised to f/u with her GYN, of need to get back on OCP's (to prevent ovulation/cysts, as well as pregnancy).   PHYSICAL EXAM:  Wt Readings from Last 3 Encounters:  04/09/18 293 lb (132.9 kg)  04/07/18 295 lb (133.8 kg)  04/05/18 295 lb (133.8 kg)    ASSESSMENT/PLAN:   Past due for lipids, TSH, urine microalbumin

## 2018-04-22 ENCOUNTER — Encounter: Payer: 59 | Admitting: Family Medicine

## 2018-05-14 ENCOUNTER — Other Ambulatory Visit: Payer: Self-pay | Admitting: Family Medicine

## 2018-05-14 DIAGNOSIS — I10 Essential (primary) hypertension: Secondary | ICD-10-CM

## 2018-05-14 DIAGNOSIS — I5189 Other ill-defined heart diseases: Secondary | ICD-10-CM

## 2018-05-14 MED FILL — FARXIGA 10 MG TABLET: 10 | 30 days supply | Qty: 30 | Fill #0

## 2018-05-14 MED FILL — SPIRONOLACTONE 25 MG TABLET: 25 | 30 days supply | Qty: 30 | Fill #0

## 2018-05-14 MED FILL — AMLODIPINE BESYLATE 10 MG T: 10 | 30 days supply | Qty: 30 | Fill #0

## 2018-05-14 NOTE — Telephone Encounter (Signed)
She had an appt on 04/22/18-was she supposed she keep this appt? Does she need to r/s?

## 2018-05-14 NOTE — Telephone Encounter (Signed)
She was supposed to keep that visit, and bring her list of sugars to the visit to review.  Looks like she canceled it the day before because she was scheduled to work, and has NO follow-up scheduled at all.  She should be scheduled for a med check in February (3 month f/u where we can repeat the A1c), but we need to see what her sugars are running now.  If she isn't able to come in, she needs to send message or fax a list of her sugars, because I do NOT want to wait until February to realize that her sugars are uncontrolled when we can potentially recognize that now.Faythe Ghee to refill x 3 mos, but we need to know what her sugars are running, and she needs med check in Feb (and can be seen sooner, ie NOW, if sugars are high) Her BP was good at her last visit, which is why 90d is okay for her two BP meds

## 2018-06-25 ENCOUNTER — Emergency Department (HOSPITAL_COMMUNITY): Payer: 59

## 2018-06-25 ENCOUNTER — Emergency Department (HOSPITAL_COMMUNITY)
Admission: EM | Admit: 2018-06-25 | Discharge: 2018-06-25 | Disposition: A | Discharge status: Home / Self Care | Payer: 59 | Attending: Emergency Medicine | Admitting: Emergency Medicine

## 2018-06-25 DIAGNOSIS — I10 Essential (primary) hypertension: Secondary | ICD-10-CM | POA: Insufficient documentation

## 2018-06-25 DIAGNOSIS — Z7984 Long term (current) use of oral hypoglycemic drugs: Secondary | ICD-10-CM | POA: Diagnosis not present

## 2018-06-25 DIAGNOSIS — J45909 Unspecified asthma, uncomplicated: Secondary | ICD-10-CM | POA: Insufficient documentation

## 2018-06-25 DIAGNOSIS — E119 Type 2 diabetes mellitus without complications: Secondary | ICD-10-CM | POA: Insufficient documentation

## 2018-06-25 DIAGNOSIS — R1084 Generalized abdominal pain: Secondary | ICD-10-CM | POA: Diagnosis present

## 2018-06-25 DIAGNOSIS — R102 Pelvic and perineal pain: Secondary | ICD-10-CM | POA: Diagnosis not present

## 2018-06-25 DIAGNOSIS — E876 Hypokalemia: Secondary | ICD-10-CM | POA: Insufficient documentation

## 2018-06-25 DIAGNOSIS — Z79899 Other long term (current) drug therapy: Secondary | ICD-10-CM | POA: Insufficient documentation

## 2018-06-25 DIAGNOSIS — Z91010 Allergy to peanuts: Secondary | ICD-10-CM | POA: Insufficient documentation

## 2018-06-25 DIAGNOSIS — R1030 Lower abdominal pain, unspecified: Secondary | ICD-10-CM | POA: Diagnosis not present

## 2018-06-25 LAB — COMPREHENSIVE METABOLIC PANEL
ALK PHOS: 58 U/L (ref 38–126)
ALT: 14 U/L (ref 0–44)
ANION GAP: 10 (ref 5–15)
AST: 19 U/L (ref 15–41)
Albumin: 3.4 g/dL — ABNORMAL LOW (ref 3.5–5.0)
BILIRUBIN TOTAL: 0.3 mg/dL (ref 0.3–1.2)
BUN: 7 mg/dL (ref 6–20)
CO2: 21 mmol/L — ABNORMAL LOW (ref 22–32)
CREATININE: 0.72 mg/dL (ref 0.44–1.00)
Calcium: 8.8 mg/dL — ABNORMAL LOW (ref 8.9–10.3)
Chloride: 106 mmol/L (ref 98–111)
GFR calc non Af Amer: >60 mL/min (ref >60)
GLUCOSE: 154 mg/dL — AB (ref 70–99)
Potassium: 3.1 mmol/L — ABNORMAL LOW (ref 3.5–5.1)
Sodium: 137 mmol/L (ref 135–145)
TOTAL PROTEIN: 6.6 g/dL (ref 6.5–8.1)

## 2018-06-25 LAB — CBC
HCT: 35.1 % — ABNORMAL LOW (ref 36.0–46.0)
Hemoglobin: 11.5 g/dL — ABNORMAL LOW (ref 12.0–15.0)
MCH: 25.8 pg — AB (ref 26.0–34.0)
MCHC: 32.8 g/dL (ref 30.0–36.0)
MCV: 78.7 fL — ABNORMAL LOW (ref 80.0–100.0)
NRBC: 0 % (ref 0.0–0.2)
Platelets: 313 10*3/uL (ref 150–400)
RBC: 4.46 MIL/uL (ref 3.87–5.11)
RDW: 13.5 % (ref 11.5–15.5)
WBC: 10.1 10*3/uL (ref 4.0–10.5)

## 2018-06-25 LAB — LIPASE, BLOOD: Lipase: 89 U/L — ABNORMAL HIGH (ref 11–51)

## 2018-06-25 LAB — I-STAT BETA HCG BLOOD, ED (MC, WL, AP ONLY)

## 2018-06-25 LAB — WET PREP, GENITAL
Clue Cells Wet Prep HPF POC: NONE SEEN
SPERM: NONE SEEN
TRICH WET PREP: NONE SEEN
Yeast Wet Prep HPF POC: NONE SEEN

## 2018-06-25 MED ORDER — DOXYCYCLINE HYCLATE 100 MG PO CAPS: 100.0000 mg | ORAL_CAPSULE | Freq: Two times a day (BID) | ORAL | 0 refills | Status: DC

## 2018-06-25 MED ORDER — CEFTRIAXONE SODIUM 250 MG IJ SOLR
250.0000 mg | Freq: Once | INTRAMUSCULAR | Status: AC
  Administered 2018-06-25: 250 mg via INTRAMUSCULAR
  Filled 2018-06-25: qty 250

## 2018-06-25 MED ORDER — FENTANYL CITRATE (PF) 100 MCG/2ML IJ SOLN
50.0000 ug | Freq: Once | INTRAMUSCULAR | Status: AC
  Administered 2018-06-25: 50 ug via INTRAVENOUS
  Filled 2018-06-25: qty 2

## 2018-06-25 MED ORDER — SODIUM CHLORIDE 0.9 % IV BOLUS
1000.0000 mL | Freq: Once | INTRAVENOUS | Status: AC
  Administered 2018-06-25: 1000 mL via INTRAVENOUS

## 2018-06-25 MED ORDER — AZITHROMYCIN 250 MG PO TABS
1000.0000 mg | ORAL_TABLET | Freq: Once | ORAL | Status: AC
  Administered 2018-06-25: 1000 mg via ORAL
  Filled 2018-06-25: qty 4

## 2018-06-25 MED ORDER — LIDOCAINE HCL (PF) 1 % IJ SOLN
INTRAMUSCULAR | Status: AC
  Administered 2018-06-25: 5 mL
  Filled 2018-06-25: qty 5

## 2018-06-25 MED ORDER — ONDANSETRON HCL 4 MG PO TABS: 4.0000 mg | ORAL_TABLET | Freq: Four times a day (QID) | ORAL | 0 refills | Status: DC

## 2018-06-25 MED ORDER — ONDANSETRON HCL 4 MG/2ML IJ SOLN
4.0000 mg | Freq: Once | INTRAMUSCULAR | Status: AC
  Administered 2018-06-25: 4 mg via INTRAVENOUS
  Filled 2018-06-25: qty 2

## 2018-06-25 MED ORDER — IBUPROFEN 600 MG PO TABS: 600.0000 mg | ORAL_TABLET | Freq: Four times a day (QID) | ORAL | 0 refills | Status: DC | PRN

## 2018-06-25 MED ORDER — POTASSIUM CHLORIDE CRYS ER 20 MEQ PO TBCR
40.0000 meq | EXTENDED_RELEASE_TABLET | Freq: Once | ORAL | Status: AC
  Administered 2018-06-25: 40 meq via ORAL
  Filled 2018-06-25: qty 2

## 2018-06-25 NOTE — ED Notes (Signed)
Patient verbalizes understanding of discharge instructions. Opportunity for questioning and answers were provided. Armband removed by staff, pt discharged from ED in wheelchair.  
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
## 2018-06-25 NOTE — ED Notes (Signed)
ED Provider at bedside. 

## 2018-06-25 NOTE — Discharge Instructions (Signed)
Please take doxycycline as prescribed, take ibuprofen as needed for pain, take Zofran as needed for nausea.  Follow-up with your doctor for further care.  Return if your condition worsen or if you have any other concern.

## 2018-06-25 NOTE — ED Triage Notes (Signed)
Pt arrives POV for eval of severe lower abd pain onset about 2 hours PTA w/ vag spotting. Denies N/V

## 2018-06-25 NOTE — ED Notes (Signed)
Patient transported to Ultrasound 

## 2018-06-26 LAB — HIV ANTIBODY (ROUTINE TESTING W REFLEX): HIV Screen 4th Generation wRfx: NONREACTIVE

## 2018-06-26 LAB — RPR: RPR: NONREACTIVE

## 2018-06-26 LAB — GC/CHLAMYDIA PROBE AMP (~~LOC~~) NOT AT ARMC
CHLAMYDIA, DNA PROBE: NEGATIVE
NEISSERIA GONORRHEA: NEGATIVE

## 2018-06-26 MED FILL — IBUPROFEN 600 MG TABLET: 600 | 8 days supply | Qty: 30 | Fill #0

## 2018-06-26 MED FILL — ONDANSETRON HCL 4 MG TABLET: 4 | 3 days supply | Qty: 12 | Fill #0

## 2018-07-06 ENCOUNTER — Emergency Department (HOSPITAL_COMMUNITY): Payer: 59

## 2018-07-06 ENCOUNTER — Other Ambulatory Visit: Payer: Self-pay

## 2018-07-06 ENCOUNTER — Encounter (HOSPITAL_COMMUNITY): Payer: Self-pay | Admitting: Emergency Medicine

## 2018-07-06 ENCOUNTER — Emergency Department (HOSPITAL_COMMUNITY)
Admission: EM | Admit: 2018-07-06 | Discharge: 2018-07-06 | Disposition: A | Discharge status: Home / Self Care | Payer: 59 | Attending: Emergency Medicine | Admitting: Emergency Medicine

## 2018-07-06 DIAGNOSIS — Z7984 Long term (current) use of oral hypoglycemic drugs: Secondary | ICD-10-CM | POA: Diagnosis not present

## 2018-07-06 DIAGNOSIS — R0602 Shortness of breath: Secondary | ICD-10-CM | POA: Insufficient documentation

## 2018-07-06 DIAGNOSIS — I10 Essential (primary) hypertension: Secondary | ICD-10-CM | POA: Insufficient documentation

## 2018-07-06 DIAGNOSIS — Z79899 Other long term (current) drug therapy: Secondary | ICD-10-CM | POA: Diagnosis not present

## 2018-07-06 DIAGNOSIS — R0789 Other chest pain: Secondary | ICD-10-CM | POA: Insufficient documentation

## 2018-07-06 DIAGNOSIS — J45909 Unspecified asthma, uncomplicated: Secondary | ICD-10-CM | POA: Diagnosis not present

## 2018-07-06 DIAGNOSIS — R079 Chest pain, unspecified: Secondary | ICD-10-CM | POA: Diagnosis not present

## 2018-07-06 DIAGNOSIS — Z91010 Allergy to peanuts: Secondary | ICD-10-CM | POA: Diagnosis not present

## 2018-07-06 LAB — BASIC METABOLIC PANEL
Anion gap: 10 (ref 5–15)
BUN: 7 mg/dL (ref 6–20)
CALCIUM: 9.2 mg/dL (ref 8.9–10.3)
CO2: 25 mmol/L (ref 22–32)
CREATININE: 0.74 mg/dL (ref 0.44–1.00)
Chloride: 103 mmol/L (ref 98–111)
GFR calc Af Amer: >60 mL/min (ref >60)
GFR calc non Af Amer: >60 mL/min (ref >60)
Glucose, Bld: 170 mg/dL — ABNORMAL HIGH (ref 70–99)
Potassium: 4.1 mmol/L (ref 3.5–5.1)
Sodium: 138 mmol/L (ref 135–145)

## 2018-07-06 LAB — CBC
HCT: 34.5 % — ABNORMAL LOW (ref 36.0–46.0)
Hemoglobin: 11.3 g/dL — ABNORMAL LOW (ref 12.0–15.0)
MCH: 25.9 pg — AB (ref 26.0–34.0)
MCHC: 32.8 g/dL (ref 30.0–36.0)
MCV: 78.9 fL — ABNORMAL LOW (ref 80.0–100.0)
Platelets: 302 10*3/uL (ref 150–400)
RBC: 4.37 MIL/uL (ref 3.87–5.11)
RDW: 14 % (ref 11.5–15.5)
WBC: 7.4 10*3/uL (ref 4.0–10.5)
nRBC: 0 % (ref 0.0–0.2)

## 2018-07-06 LAB — I-STAT BETA HCG BLOOD, ED (MC, WL, AP ONLY): I-stat hCG, quantitative: <5 m[IU]/mL (ref <5)

## 2018-07-06 LAB — I-STAT TROPONIN, ED
Troponin i, poc: 0.01 ng/mL (ref 0.00–0.08)
Troponin i, poc: 0.01 ng/mL (ref 0.00–0.08)

## 2018-07-06 LAB — BRAIN NATRIURETIC PEPTIDE: B NATRIURETIC PEPTIDE 5: 22.1 pg/mL (ref 0.0–100.0)

## 2018-07-06 LAB — D-DIMER, QUANTITATIVE: D-Dimer, Quant: 0.3 ug/mL-FEU (ref 0.00–0.50)

## 2018-07-06 MED ORDER — ALBUTEROL SULFATE (2.5 MG/3ML) 0.083% IN NEBU
5.0000 mg | INHALATION_SOLUTION | Freq: Once | RESPIRATORY_TRACT | Status: AC
  Administered 2018-07-06: 5 mg via RESPIRATORY_TRACT
  Filled 2018-07-06: qty 6

## 2018-07-06 MED ORDER — SODIUM CHLORIDE 0.9% FLUSH: 3.0000 mL | Freq: Once | INTRAVENOUS | Status: DC

## 2018-07-06 MED ORDER — ALBUTEROL SULFATE HFA 108 (90 BASE) MCG/ACT IN AERS
2.0000 | INHALATION_SPRAY | RESPIRATORY_TRACT | Status: DC | PRN
  Filled 2018-07-06: qty 6.7

## 2018-07-06 MED ORDER — IPRATROPIUM BROMIDE 0.02 % IN SOLN
0.5000 mg | Freq: Once | RESPIRATORY_TRACT | Status: AC
  Administered 2018-07-06: 0.5 mg via RESPIRATORY_TRACT
  Filled 2018-07-06: qty 2.5

## 2018-07-06 MED ORDER — AEROCHAMBER PLUS FLO-VU SMALL MISC
1.0000 | Freq: Once | Status: DC
  Filled 2018-07-06: qty 1

## 2018-07-06 NOTE — Discharge Instructions (Signed)
Please read and follow all provided instructions.  Your diagnoses today include:  1. Shortness of breath   2. Atypical chest pain     Tests performed today include:  An EKG of your heart  A chest x-ray  Cardiac enzymes - a blood test for heart muscle damage did not show signs of heart attack  Blood counts and electrolytes  Screening test for heart failure and blood clot -were normal  Vital signs. See below for your results today.   Medications prescribed:   Albuterol inhaler - medication that opens up your airway  Use inhaler as follows: 1-2 puffs with spacer every 4 hours as needed for wheezing, cough, or shortness of breath.   Take any prescribed medications only as directed.  Follow-up instructions: Please follow-up with your primary care provider as soon as you can for further evaluation of your symptoms.   Return instructions:  SEEK IMMEDIATE MEDICAL ATTENTION IF:  You have severe chest pain, especially if the pain is crushing or pressure-like and spreads to the arms, back, neck, or jaw, or if you have sweating, nausea (feeling sick to your stomach), or shortness of breath. THIS IS AN EMERGENCY. Don't wait to see if the pain will go away. Get medical help at once. Call 911 or 0 (operator). DO NOT drive yourself to the hospital.   Your chest pain gets worse and does not go away with rest.   You have an attack of chest pain lasting longer than usual, despite rest and treatment with the medications your caregiver has prescribed.   You wake from sleep with chest pain or shortness of breath.  You feel dizzy or faint.  You have chest pain not typical of your usual pain for which you originally saw your caregiver.   You have any other emergent concerns regarding your health.  Additional Information: Chest pain comes from many different causes. Your caregiver has diagnosed you as having chest pain that is not specific for one problem, but does not require admission.   You are at low risk for an acute heart condition or other serious illness.   Your vital signs today were: BP (!) 107/59    Pulse (!) 28    Temp 97.8 F (36.6 C) (Oral)    Resp (!) 25    Ht 5\' 4"  (1.626 m)    Wt 132.5 kg    LMP 06/07/2018    SpO2 100%    BMI 50.12 kg/m  If your blood pressure (BP) was elevated above 135/85 this visit, please have this repeated by your doctor within one month. --------------

## 2018-07-06 NOTE — ED Triage Notes (Signed)
C/o SOB and pain to center of chest and under L breast since 5pm.  States she went to her sister's house and did a neb treatment.  Denies nausea and vomiting.

## 2018-07-06 NOTE — ED Notes (Signed)
Pt ambulated in hallway and c/o SOB. O2 sats initially 100% but then dropped to 80's with poor waveform. Notified Josh Nigel Mormon, PA-C.
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
## 2018-07-08 ENCOUNTER — Ambulatory Visit: Payer: 59 | Admitting: Family Medicine

## 2018-07-08 ENCOUNTER — Encounter: Payer: Self-pay | Admitting: Family Medicine

## 2018-07-08 VITALS — BP 170/116 | HR 72 | Ht 64.0 in | Wt 300.0 lb

## 2018-07-08 DIAGNOSIS — J4520 Mild intermittent asthma, uncomplicated: Secondary | ICD-10-CM | POA: Diagnosis not present

## 2018-07-08 DIAGNOSIS — I10 Essential (primary) hypertension: Secondary | ICD-10-CM

## 2018-07-08 DIAGNOSIS — J069 Acute upper respiratory infection, unspecified: Secondary | ICD-10-CM | POA: Diagnosis not present

## 2018-07-08 DIAGNOSIS — J00 Acute nasopharyngitis [common cold]: Secondary | ICD-10-CM

## 2018-07-08 MED ORDER — IPRATROPIUM BROMIDE 0.06 % NA SOLN: 2.0000 | Freq: Four times a day (QID) | NASAL | 1 refills | Status: DC

## 2018-07-08 MED FILL — IBUPROFEN 600 MG TABLET: 600 | 8 days supply | Qty: 30 | Fill #0

## 2018-07-08 MED FILL — FARXIGA 10 MG TABLET: 10 | 30 days supply | Qty: 30 | Fill #1

## 2018-07-08 MED FILL — SPIRONOLACTONE 25 MG TABLET: 25 | 30 days supply | Qty: 30 | Fill #1

## 2018-07-08 MED FILL — AMLODIPINE BESYLATE 10 MG T: 10 | 30 days supply | Qty: 30 | Fill #1

## 2018-07-08 MED FILL — IPRATROPIUM 0.06% SPRAY: 0.06 | 10 days supply | Qty: 15 | Fill #0

## 2018-07-08 NOTE — Progress Notes (Signed)
Chief Complaint  Patient presents with  . ER follow up    seen for SOB, still having some SOB-but is somewhat better.     Pt went to ER 07/06/2018 with CP (under left breast) and SOB x 2 days, along with sore throat and nasal congestion.  She went to the ER because she felt she needed a nebulizer treatment (and was missing something she needed to use hers at home).  Exam was unremarkable.  Labs notable for glu 170, Hg 11.3. Normal K 4.1, WBC 7.4 EKG normal, CXR clear. Troponins normal, D-dimer and BNP neg, 100% oxygen sat. She was treated with albuterol and atrovent nebs in ER and told to continue nebs at home, also given new MDI with spacer.  She has persistent head congestion--ear plugging/popping, throat discomfort is a little better.  +facial pressure, runny nose.  Denies cough.  Nasal drainage is slightly yellowish. Taking a generic version of Coricidin HBP. She does find this helpful, using it regularly. She has been using her inhaler (not the nebulizer), taking it every 4-6 hours and does get relief from it.  Used it 2-3 times yesterday, and doing a little better than the day prior. Used inhaler prior to coming today, currently feels comfortable. +sick contacts at home and work.  BP's at home have been fine. She hasn't been taking amlodipine or spironolactone for the last 3 days --wasn't sure about mixing with OTC meds.  DM:  Taking 1500mg  of metformin (didn't tolerate taking 1000mg  in the morning due to GI side effects.  Compliant with [place].  Has appt next week for her diabetes. Reports her diet has been better, and is hoping for good results (too soon to do A1c now). Lab Results  Component Value Date   HGBA1C 9.0 (A) 04/09/2018   Pt reports she was REQUIRED to fill out FMLA for missed work--her supervisor would not accept the doctor's note from the hospital that she was seen.  Admits that her answers are "no" and that it doesn't seem appropriate but she is following her supervisor's  orders.  PMH, PSH, SH reviewed   Outpatient Encounter Medications as of 07/08/2018  Medication Sig Note  . amLODipine (NORVASC) 10 MG tablet TAKE 1 TABLET (10 MG TOTAL) BY MOUTH AT BEDTIME.   . dapagliflozin propanediol (FARXIGA) 10 MG TABS tablet Take 10 mg by mouth daily.   Marland Kitchen FREESTYLE LITE test strip USE AS DIRECTED 2 (TWO) TIMES DAILY AFTER A MEAL   . metFORMIN (GLUCOPHAGE) 1000 MG tablet Take 1 tablet (1,000 mg total) by mouth 2 (two) times daily with a meal. 07/08/2018: Admits to only taking 1500mg /d (500mg  tablet in morning, 1000mg  at night)  . Multiple Vitamins-Minerals (WOMENS DAILY FORMULA PO) Take 1 tablet by mouth daily.   . potassium chloride (K-DUR,KLOR-CON) 10 MEQ tablet Take 10 mEq by mouth daily.    Marland Kitchen spironolactone (ALDACTONE) 25 MG tablet TAKE 1 TABLET BY MOUTH DAILY. (Patient taking differently: Take 25 mg by mouth daily. )   . ibuprofen (ADVIL,MOTRIN) 600 MG tablet Take 1 tablet (600 mg total) by mouth every 6 (six) hours as needed. (Patient not taking: Reported on 07/08/2018)    No facility-administered encounter medications on file as of 07/08/2018.      Allergies  Allergen Reactions  . Dilaudid [Hydromorphone Hcl] Hives  . Morphine And Related Hives  . Peanut-Containing Drug Products Hives  . Strawberry Extract Swelling    Swelling is of the eye.   ROS: No fever, chills (had  some chills 2 days ago, when in hospital only), no nausea, vomiting, diarrhea. Slight headache and some chest heaviness/congestion. No bleeding, bruising, rash, edema or other concerns except as noted in HPI   PHYSICAL EXAM:  BP (!) 170/116   Pulse 72   Ht 5\' 4"  (1.626 m)   Wt 300 lb (136.1 kg)   LMP 06/07/2018 (Approximate)   SpO2 98%   BMI 51.49 kg/m   180/114 on repeat by MD  Well appearing female with frequent sniffling, in no distress, appears comfortable and in good spirits HEENT: PERRL, EOMI, conjunctiva and sclera are clear, EOMI. Right TM slightly retracted, L was  normal. EAC's normal.  Nasal mucosa mod-severely edematous, R>L, no purulence.  Sinuses are diffusely tender. OP is clear Neck: no lymphadenopathy or mass Heart: regular rate and rhythm without murmur Lungs: clear bilaterally, good air movement, no wheezes, rales, ronchi (reports she used albuterol just prior to her visit). Extremities: no edema Neuro: alert and oriented, cranial nerves intact, normal gait Psych: normal mood, affect, hygiene and grooming   ASSESSMENT/PLAN:  Viral upper respiratory infection - no e/o bacterial infection .Cont chlorpheniramine; add atrovent nasal prn, and mucinex  Mild intermittent reactive airway disease without complication - continue albuterol prn (neb when home, MDI for when away)  Acute rhinitis - Plan: ipratropium (ATROVENT) 0.06 % nasal spray  Essential hypertension - elevated today due to med noncompliance. restart amlodipine and spironolactone today  Expect FMLA forms to be sent (even though not likely appropriate for this acute illness)--asthma Got note form ER to be OOW x 2d, return 06/2010, not scheduled to work again until Friday.  Keep f/u appt next week (scheduled for 2/17)  ER records reviewed in detail; counseled extensively regarding OTC meds, and her rx meds.  Please restart your amlodipine and spironolactone--these do not need to be stopped in the future when sick (only the spironolactone if dehydrated, since it is a diuretic).  Continue albuterol as needed--you may want to use the nebulizer when you're home, so that you won't run out too quickly of the inhaler, that you should keep with you when not home.  Use the atrovent nasal spray to help dry up your runny nose. You can use this every 6 hours, if needed. Consider adding mucinex (plain, just guaifenesin)--expectorant to help loosen up the mucus and phlegm and treat chest congestion.  Return next week as scheduled, and bring your list of sugars and blood pressures

## 2018-07-08 NOTE — Patient Instructions (Signed)
  Please restart your amlodipine and spironolactone--these do not need to be stopped in the future when sick (only the spironolactone if dehydrated, since it is a diuretic).  Continue albuterol as needed--you may want to use the nebulizer when you're home, so that you won't run out too quickly of the inhaler, that you should keep with you when not home.  Use the atrovent nasal spray to help dry up your runny nose. You can use this every 6 hours, if needed. Consider adding mucinex (plain, just guaifenesin)--expectorant to help loosen up the mucus and phlegm and treat chest congestion.  Return next week as scheduled, and bring your list of sugars and blood pressures

## 2018-07-12 NOTE — Progress Notes (Deleted)
  Patient presents for routine follow up on her chronic problems including diabetes and hypertension. She was seen last week with acute illness, hadn't been take some of her blood pressure medications, and BP was very high. Since restarting all meds, BP's have been running  She denies headaches, dizziness, edema, chest pain, palpitations. Shortness of breath from her asthma from recent illness has improved.  Diabetes:  Taking '1500mg'$  of metformin (didn't tolerate taking '1000mg'$  in the morning due to GI side effects.  Compliant with [place], denies side effects. Sugars have been running  Due for A1c today.  Last A1c was prior to starting farxiga  Lab Results  Component Value Date   HGBA1C 9.0 (A) 04/09/2018   She checks her feet regularly and denies numbness, tingling, sores, concerns  She is past due for much of her diabetes care, due to sporadic follow-up for monitoring (comes when she is sick). She is past due for eye exam, urine microalbumin, foot exam and pneumovax  H/o Vitamin D deficiency: she has had prescription replacement in the past.  Last check was 28 in 04/2016. She currently is taking UPDATE  OSA: She has been using her CPAP nightly.She gets a headache if she doesn't use it. UPDATE  PHYSICAL EXAM:  Wt Readings from Last 3 Encounters:  07/08/18 300 lb (136.1 kg)  07/06/18 292 lb (132.5 kg)  04/09/18 293 lb (132.9 kg)     ASSESSMENT/PLAN:  A1c Pneumovax  Foot exam Needs to schedule diabetic eye exam  c-met, lipids, TSH, Urine microalbumin, consider vit D (if not taking supplement)

## 2018-07-13 ENCOUNTER — Encounter: Payer: Self-pay | Admitting: Family Medicine

## 2018-07-29 ENCOUNTER — Encounter: Payer: Self-pay | Admitting: Family Medicine

## 2018-08-19 NOTE — Progress Notes (Signed)
Chief Complaint  Patient presents with  . Diabetes    fasting med check.     Patient presents for routine follow up on her chronic problems including diabetes and hypertension.She has cancelled many med check appointments, mainly coming for acute illness or ER follow-up.  Many gaps in care regarding her diabetes. She is past due for eye exam, urine microalbumin, foot exam and pneumovax She reports her last eye exam was 04/2017 (for contacts--they did NOT do the diabetes exam at that time).  "I feel better than I've felt in years"  Cut back on diet sodas.  She quit stress-eating, reads instead, on her [place].  She was last seen in February for acute illness, hadn't been taking some of her blood pressure medications, and BP was very high. Since restarting all meds, BP's have been running up to 135/90 as the highest at work, mostly 130/80. She isn't able to monitor her BP at home.  Currently has 3 kids at home (due to COVID-19 pandemic), so feels a little more stressed.  Denies any missed amlodipine or spironolactone doses. Is almost out, needs refills.  She denies headaches (just slight, which she thinks is due to not wearing her glasses which she left at work), dizziness, edema, chest pain, palpitations.  Denies shortness of breath. Hasn't been getting much exercise recently.  +higher sodium diet recently--bring in ramen noodles and lunch meat sandwiches for lunches at work.  No further problems with wheezing, no albuterol since February visit.  Diabetes:  Taking 1500mg  of metformin --500mg  in the morning, and 1000mg  with dinner (didn't tolerate taking 1000mg  in the morning due to GI side effects). Compliant with [facility], denies side effects other than some urinary frequency. She hasn't been checking her sugars since the kids have been out of school.  She has monitor, strips, lancets, just forgot.  She recalls that her fastings were still elevated (130's), saw some 220's, but diet/snacking is  less now.    Due for A1c today.  Last A1c was prior to starting farxiga Lab Results  Component Value Date   HGBA1C 9.0 (A) 04/09/2018   She checks her feet regularly and denies numbness, tingling, sores, concerns  Contraception:  This was discussed in detail today, given that she should be on ACEI or ARB and on statin for management of her diabetes.  They are currently using withdrawal method. She reports that she would like another child, but her husband doesn't.    H/oVitamin D deficiency: she has had prescription replacement in the past.Last check was 28 in 04/2016. She currently is taking a MVI daily (?dose of D).  OSA: She hasbeen using her CPAP nightly.She gets a headache if she doesn't use it. Machine is 36 years old, doesn't think it is working the same.  "I notice a difference"--doesn't think it is working as well. Denies feeling unrefreshed or daytime somnolence.   Outpatient Encounter Medications as of 08/20/2018  Medication Sig Note  . amLODipine (NORVASC) 10 MG tablet TAKE 1 TABLET (10 MG TOTAL) BY MOUTH AT BEDTIME.   . dapagliflozin propanediol (FARXIGA) 10 MG TABS tablet Take 10 mg by mouth daily.   Marland Kitchen FREESTYLE LITE test strip USE AS DIRECTED 2 (TWO) TIMES DAILY AFTER A MEAL   . metFORMIN (GLUCOPHAGE) 1000 MG tablet Take 1 tablet (1,000 mg total) by mouth 2 (two) times daily with a meal. 07/08/2018: Admits to only taking 1500mg /d (500mg  tablet in morning, 1000mg  at night)  . Multiple Vitamins-Minerals (WOMENS DAILY FORMULA PO)  Take 1 tablet by mouth daily.   . potassium chloride (K-DUR,KLOR-CON) 10 MEQ tablet Take 10 mEq by mouth daily.    Marland Kitchen spironolactone (ALDACTONE) 25 MG tablet TAKE 1 TABLET BY MOUTH DAILY. (Patient taking differently: Take 25 mg by mouth daily. )   . ibuprofen (ADVIL,MOTRIN) 600 MG tablet Take 1 tablet (600 mg total) by mouth every 6 (six) hours as needed. (Patient not taking: Reported on 07/08/2018)   . ipratropium (ATROVENT) 0.06 % nasal spray  Place 2 sprays into both nostrils 4 (four) times daily. (Patient not taking: Reported on 08/20/2018) 08/20/2018: Uses prn   No facility-administered encounter medications on file as of 08/20/2018.    Allergies  Allergen Reactions  . Dilaudid [Hydromorphone Hcl] Hives  . Morphine And Related Hives  . Peanut-Containing Drug Products Hives  . Strawberry Extract Swelling    Swelling is of the eye.    ROS:  no fever, chills, weight changes, headaches (just the few days from not wearing her glasses).  No URI symptoms, cough, shortness of breath or wheezing.  No nausea, vomiting, diarrhea. No numbness, tingling, bleeding, bruising, rashes, depression or other concerns.   PHYSICAL EXAM:  BP (!) 150/108   Pulse 72   Ht 5\' 4"  (1.626 m)   Wt 296 lb 12.8 oz (134.6 kg)   LMP 08/06/2018 (Approximate)   BMI 50.95 kg/m  168/120 on repeat by MD  Wt Readings from Last 3 Encounters:  08/20/18 296 lb 12.8 oz (134.6 kg)  07/08/18 300 lb (136.1 kg)  07/06/18 292 lb (132.5 kg)   Well-appearing, pleasant, obese female, in good spirits. HEENT: conjunctiva and sclera are clear, EOMI, fundi benign. OP clear Neck: no lymphadenopathy, thyromegaly or carotid bruit Heart: regular rate and rhythm, no murmur Lungs: clear bilaterally Back: no spinal or CVA tenderness Abdomen: soft, obese, nontender, no mass Extremities: 2+ pulses, no edema, normal monofilament exam, no lesions Psych: normal mood, affect, hygiene and grooming Skin: normal turgor, no rashes Neuro: alert and oriented, cranial nerves intact, normal gait.  Lab Results  Component Value Date   HGBA1C 8.0 (A) 08/20/2018    ASSESSMENT/PLAN:  Type 2 diabetes mellitus not at goal [facility]) - try to increase metformin to 1000mg  BID, continue [place]. Check sugars. If remain elevated, may need add'l med Celesta Gentile, onglyza). - Plan: dapagliflozin propanediol (FARXIGA) 10 MG TABS tablet  Essential hypertension - very high today, despite taking meds.   +higher Na diet recently. Monitor regularly, cut back on Na in diet. Contact us if remains high - Plan: Comprehensive metabolic panel, spironolactone (ALDACTONE) 25 MG tablet  Hypokalemia - due for recheck - Plan: potassium chloride (K-DUR,KLOR-CON) 10 MEQ tablet  Vitamin D deficiency - taking MVI only; recheck D, suspect will be low - Plan: VITAMIN D 25 Hydroxy (Vit-D Deficiency, Fractures)  Diabetes mellitus type 2 with complications (HCC) - Plan: HgB A1c, Comprehensive metabolic panel, Lipid panel, TSH, Microalbumin / creatinine urine ratio, glucose blood (FREESTYLE LITE) test strip  Medication monitoring encounter - Plan: VITAMIN D 25 Hydroxy (Vit-D Deficiency, Fractures), Comprehensive metabolic panel  Immunization due - risks of pneumovax and reason for need was discussed - Plan: Pneumococcal polysaccharide vaccine 23-valent greater than or equal to 2yo subcutaneous/IM  Essential hypertension, benign - discussed recommendation for ARB/ACEI, but cannot start while not using reliable form of contraception - Plan: amLODipine (NORVASC) 10 MG tablet  Diastolic dysfunction - Plan: spironolactone (ALDACTONE) 25 MG tablet  Hypertension associated with diabetes ([place])  Encounter for other general counseling or  advice on contraception - Discussed pregnancy would not be a good idea at this time (uncontrolled BP and DM); Statin and ACEI/ARB recommended, needs reliable contraception to take  Length of visit was >45 minutes, significant counseling (more than 1/2).   Please schedule your annual diabetic eye exam.  Please ask the eye doctors to send [place] a copy of the report, indicating whether or not there is retinopathy  You really should be on statins and ACE inhibitors or ARB's (blood pressure medications to help prevent kidney problems) to prevent complications from diabetes.  We cannot put you on either of these medications without using reliable contraception (not safe to get pregnant while on  those medications). Please consider using more reliable contraception (probably not a good idea to get pregnant while diabetes and blood pressure are not at goal anyway), so that we can put you on the appropriate care to prevent complications.  We discussed Plan B for emergency contraception.  Try increasing your metformin to 1000mg  twice daily.  You may titrate up by taking 500mg  before breakfast, 500mg  with lunch, and 1000mg  with dinner. If no problems, then try and take 1000mg  together, before breakfast, instead of splitting it up. If you do have some loose stools, try using metamucil daily.  Please start checking your sugars regularly--fasting, and also 2 hour after a meal or at bedtime.   Keep a list on the log provided. Send [place] your sugars in the next 2-3 weeks.  If they remain above goal, despite working hard on diet and increasing the metformin, then we will need to adjust your regimen. We discussed the class of medications including Onglyza and Januvia as add-on options (safety in pregnancy not known, but not contraindicated--would stop/change if you got pregnant).  Sleep apnea--if you don't feel like you're getting the same benefit, you may need an autotitration vs a new machine (if your insurance allows).  Check with your insurance and DME provider, and let me know how I can help. (You may need to get another sleep study performed, in order to get a new machine).  Your blood pressure was very high today. Please try and monitor regularly. It may be up due to higher sodium content in your food. Please read the labels for sodium and sugar content. Cut back on carbs (pastas, bagels), eating more protein and vegetables.  Contact us within the next 3-4 weeks with your lists of blood pressures and sugars.

## 2018-08-20 ENCOUNTER — Encounter: Payer: Self-pay | Admitting: Family Medicine

## 2018-08-20 ENCOUNTER — Other Ambulatory Visit: Payer: Self-pay

## 2018-08-20 ENCOUNTER — Ambulatory Visit (INDEPENDENT_AMBULATORY_CARE_PROVIDER_SITE_OTHER): Payer: 59 | Admitting: Family Medicine

## 2018-08-20 VITALS — BP 150/108 | HR 72 | Ht 64.0 in | Wt 296.8 lb

## 2018-08-20 DIAGNOSIS — I10 Essential (primary) hypertension: Secondary | ICD-10-CM | POA: Diagnosis not present

## 2018-08-20 DIAGNOSIS — Z23 Encounter for immunization: Secondary | ICD-10-CM

## 2018-08-20 DIAGNOSIS — I152 Hypertension secondary to endocrine disorders: Secondary | ICD-10-CM

## 2018-08-20 DIAGNOSIS — E559 Vitamin D deficiency, unspecified: Secondary | ICD-10-CM | POA: Diagnosis not present

## 2018-08-20 DIAGNOSIS — E1159 Type 2 diabetes mellitus with other circulatory complications: Secondary | ICD-10-CM

## 2018-08-20 DIAGNOSIS — I5189 Other ill-defined heart diseases: Secondary | ICD-10-CM | POA: Diagnosis not present

## 2018-08-20 DIAGNOSIS — E119 Type 2 diabetes mellitus without complications: Secondary | ICD-10-CM

## 2018-08-20 DIAGNOSIS — E118 Type 2 diabetes mellitus with unspecified complications: Secondary | ICD-10-CM

## 2018-08-20 DIAGNOSIS — Z5181 Encounter for therapeutic drug level monitoring: Secondary | ICD-10-CM

## 2018-08-20 DIAGNOSIS — E876 Hypokalemia: Secondary | ICD-10-CM

## 2018-08-20 DIAGNOSIS — Z3009 Encounter for other general counseling and advice on contraception: Secondary | ICD-10-CM

## 2018-08-20 LAB — POCT GLYCOSYLATED HEMOGLOBIN (HGB A1C): Hemoglobin A1C: 8 % — AB (ref 4.0–5.6)

## 2018-08-20 MED ORDER — POTASSIUM CHLORIDE CRYS ER 10 MEQ PO TBCR: 10.0000 meq | EXTENDED_RELEASE_TABLET | Freq: Every day | ORAL | 1 refills | Status: DC

## 2018-08-20 MED ORDER — SPIRONOLACTONE 25 MG PO TABS: 25.0000 mg | ORAL_TABLET | Freq: Every day | ORAL | 1 refills | Status: DC

## 2018-08-20 MED ORDER — DAPAGLIFLOZIN PROPANEDIOL 10 MG PO TABS: 10.0000 mg | ORAL_TABLET | Freq: Every day | ORAL | 1 refills | Status: DC

## 2018-08-20 MED ORDER — GLUCOSE BLOOD VI STRP: ORAL_STRIP | 5 refills | Status: DC

## 2018-08-20 MED ORDER — AMLODIPINE BESYLATE 10 MG PO TABS: 10.0000 mg | ORAL_TABLET | Freq: Every day | ORAL | 1 refills | Status: DC

## 2018-08-20 NOTE — Patient Instructions (Addendum)
  Please schedule your annual diabetic eye exam.  Please ask the eye doctors to send [place] a copy of the report, indicating whether or not there is retinopathy  You really should be on statins and ACE inhibitors or ARB's (blood pressure medications to help prevent kidney problems) to prevent complications from diabetes.  We cannot put you on either of these medications without using reliable contraception (not safe to get pregnant while on those medications). Please consider using more reliable contraception (probably not a good idea to get pregnant while diabetes and blood pressure are not at goal anyway), so that we can put you on the appropriate care to prevent complications.  We discussed Plan B for emergency contraception.  Try increasing your metformin to 1000mg  twice daily.  You may titrate up by taking 500mg  before breakfast, 500mg  with lunch, and 1000mg  with dinner. If no problems, then try and take 1000mg  together, before breakfast, instead of splitting it up. If you do have some loose stools, try using metamucil daily.  Please start checking your sugars regularly--fasting, and also 2 hour after a meal or at bedtime.   Keep a list on the log provided. Send [place] your sugars in the next 2-3 weeks.  If they remain above goal, despite working hard on diet and increasing the metformin, then we will need to adjust your regimen. We discussed the class of medications including Onglyza and Januvia as add-on options (safety in pregnancy not known, but not contraindicated--would stop/change if you got pregnant).  Sleep apnea--if you don't feel like you're getting the same benefit, you may need an autotitration vs a new machine (if your insurance allows).  Check with your insurance and DME provider, and let me know how I can help. (You may need to get another sleep study performed, in order to get a new machine).  Your blood pressure was very high today. Please try and monitor regularly. It may be up  due to higher sodium content in your food. Please read the labels for sodium and sugar content. Cut back on carbs (pastas, bagels), eating more protein and vegetables.  Contact us within the next 3-4 weeks with your lists of blood pressures and sugars.

## 2018-08-23 MED ORDER — VITAMIN D (ERGOCALCIFEROL) 1.25 MG (50000 UNIT) PO CAPS: 50000.0000 [IU] | ORAL_CAPSULE | ORAL | 0 refills | Status: DC

## 2018-08-23 NOTE — Addendum Note (Signed)
Addended by: Rita Ohara on: 08/23/2018 06:41 PM   Modules accepted: Orders

## 2018-08-24 LAB — MICROALBUMIN / CREATININE URINE RATIO
Creatinine, Urine: 111 mg/dL
Microalb/Creat Ratio: 36 mg/g creat — ABNORMAL HIGH (ref 0–29)
Microalbumin, Urine: 40.5 ug/mL

## 2018-08-24 LAB — COMPREHENSIVE METABOLIC PANEL
A/G RATIO: 1.4 (ref 1.2–2.2)
ALT: 12 IU/L (ref 0–32)
AST: 12 IU/L (ref 0–40)
Albumin: 4.2 g/dL (ref 3.8–4.8)
Alkaline Phosphatase: 69 IU/L (ref 39–117)
BUN/Creatinine Ratio: 16 (ref 9–23)
BUN: 10 mg/dL (ref 6–20)
Bilirubin Total: <0.2 mg/dL (ref 0.0–1.2)
CHLORIDE: 102 mmol/L (ref 96–106)
CO2: 21 mmol/L (ref 20–29)
Calcium: 9.6 mg/dL (ref 8.7–10.2)
Creatinine, Ser: 0.64 mg/dL (ref 0.57–1.00)
GFR calc Af Amer: 134 mL/min/{1.73_m2} (ref >59)
GFR calc non Af Amer: 116 mL/min/{1.73_m2} (ref >59)
Globulin, Total: 2.9 g/dL (ref 1.5–4.5)
Glucose: 131 mg/dL — ABNORMAL HIGH (ref 65–99)
POTASSIUM: 4.1 mmol/L (ref 3.5–5.2)
Sodium: 141 mmol/L (ref 134–144)
Total Protein: 7.1 g/dL (ref 6.0–8.5)

## 2018-08-24 LAB — TSH: TSH: 1.41 u[IU]/mL (ref 0.450–4.500)

## 2018-08-24 LAB — LIPID PANEL
Chol/HDL Ratio: 3.4 ratio (ref 0.0–4.4)
Cholesterol, Total: 191 mg/dL (ref 100–199)
HDL: 57 mg/dL (ref >39)
LDL Calculated: 111 mg/dL — ABNORMAL HIGH (ref 0–99)
Triglycerides: 116 mg/dL (ref 0–149)
VLDL CHOLESTEROL CAL: 23 mg/dL (ref 5–40)

## 2018-08-24 LAB — VITAMIN D 25 HYDROXY (VIT D DEFICIENCY, FRACTURES): Vit D, 25-Hydroxy: 13.9 ng/mL — ABNORMAL LOW (ref 30.0–100.0)

## 2018-08-24 MED FILL — VIT D2 1.25 MG (50,000 UNIT: 1.25 MG | 84 days supply | Qty: 12 | Fill #0

## 2018-08-26 MED FILL — AMLODIPINE BESYLATE 10 MG T: 10 | 90 days supply | Qty: 90 | Fill #0

## 2018-08-26 MED FILL — SPIRONOLACTONE 25 MG TABS: 25 | 90 days supply | Qty: 90 | Fill #0

## 2018-08-26 MED FILL — FARXIGA 10 MG TABLET: 10 | 90 days supply | Qty: 90 | Fill #0

## 2018-08-26 MED FILL — FREESTYLE LITE TEST STRIP: 50 days supply | Qty: 100 | Fill #0

## 2018-08-26 MED FILL — POTASSIUM CHL ER M10 TABLET: 10 | 90 days supply | Qty: 90 | Fill #0

## 2018-09-05 ENCOUNTER — Encounter (HOSPITAL_COMMUNITY): Payer: Self-pay | Admitting: *Deleted

## 2018-09-05 ENCOUNTER — Ambulatory Visit (HOSPITAL_COMMUNITY)
Admission: EM | Admit: 2018-09-05 | Discharge: 2018-09-05 | Disposition: A | Discharge status: Home / Self Care | Payer: 59 | Attending: Emergency Medicine | Admitting: Emergency Medicine

## 2018-09-05 ENCOUNTER — Other Ambulatory Visit: Payer: Self-pay

## 2018-09-05 DIAGNOSIS — K047 Periapical abscess without sinus: Secondary | ICD-10-CM

## 2018-09-05 DIAGNOSIS — K0889 Other specified disorders of teeth and supporting structures: Secondary | ICD-10-CM

## 2018-09-05 MED ORDER — NAPROXEN 500 MG PO TABS: 500.0000 mg | ORAL_TABLET | Freq: Two times a day (BID) | ORAL | 0 refills | Status: DC

## 2018-09-05 MED ORDER — AMOXICILLIN-POT CLAVULANATE 875-125 MG PO TABS: 1.0000 | ORAL_TABLET | Freq: Two times a day (BID) | ORAL | 0 refills | Status: AC

## 2018-09-05 NOTE — ED Triage Notes (Signed)
C/O left lower toothache with "knot" and swelling x 2-3 days.  Denies fevers.

## 2018-09-05 NOTE — Discharge Instructions (Signed)
Naproxen prescribed.  Use as directed for pain relief Augmentin prescribed.  Take as directed and to completion Recommend soft diet until evaluated by dentist Maintain oral hygiene care Follow up with dentist as soon as possible for further evaluation and treatment  Return or go to the ED if you have any new or worsening symptoms such as fever, chills, difficulty swallowing, painful swallowing, oral or neck swelling, nausea, vomiting, chest pain, SOB, etc..Marland Kitchen
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
## 2018-09-22 ENCOUNTER — Ambulatory Visit (HOSPITAL_COMMUNITY)
Admission: EM | Admit: 2018-09-22 | Discharge: 2018-09-22 | Disposition: A | Discharge status: Home / Self Care | Payer: 59 | Attending: Family Medicine | Admitting: Family Medicine

## 2018-09-22 ENCOUNTER — Encounter (HOSPITAL_COMMUNITY): Payer: Self-pay | Admitting: Emergency Medicine

## 2018-09-22 ENCOUNTER — Other Ambulatory Visit: Payer: Self-pay

## 2018-09-22 DIAGNOSIS — I10 Essential (primary) hypertension: Secondary | ICD-10-CM

## 2018-09-22 DIAGNOSIS — T7840XA Allergy, unspecified, initial encounter: Secondary | ICD-10-CM

## 2018-09-22 DIAGNOSIS — Z77120 Contact with and (suspected) exposure to mold (toxic): Secondary | ICD-10-CM | POA: Diagnosis not present

## 2018-09-22 DIAGNOSIS — R0602 Shortness of breath: Secondary | ICD-10-CM | POA: Diagnosis not present

## 2018-09-22 MED ORDER — METHYLPREDNISOLONE SODIUM SUCC 125 MG IJ SOLR
80.0000 mg | Freq: Once | INTRAMUSCULAR | Status: AC
  Administered 2018-09-22: 125 mg via INTRAMUSCULAR

## 2018-09-22 MED ORDER — ALBUTEROL SULFATE (2.5 MG/3ML) 0.083% IN NEBU: 2.5000 mg | INHALATION_SOLUTION | Freq: Four times a day (QID) | RESPIRATORY_TRACT | 12 refills | Status: DC | PRN

## 2018-09-22 MED ORDER — PREDNISONE 20 MG PO TABS: 20.0000 mg | ORAL_TABLET | Freq: Two times a day (BID) | ORAL | 0 refills | Status: DC

## 2018-09-22 MED ORDER — METHYLPREDNISOLONE SODIUM SUCC 125 MG IJ SOLR
INTRAMUSCULAR | Status: AC
  Filled 2018-09-22: qty 2

## 2018-09-22 NOTE — ED Triage Notes (Signed)
Pt sts possible allergic reaction to mold; pt sts some SOB and asthma sx since finding mold

## 2018-09-22 NOTE — Discharge Instructions (Signed)
You need to drink plenty of fluids Stay away from the mold Take prednisone twice a day for 5 days Use your nebulizer as indicated Follow-up with asthma and allergy specialist
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
## 2018-11-17 DIAGNOSIS — E119 Type 2 diabetes mellitus without complications: Secondary | ICD-10-CM | POA: Diagnosis not present

## 2018-11-17 LAB — HM DIABETES EYE EXAM

## 2018-12-07 ENCOUNTER — Telehealth: Payer: 59 | Admitting: Physician Assistant

## 2018-12-07 ENCOUNTER — Encounter: Payer: Self-pay | Admitting: Physician Assistant

## 2018-12-07 DIAGNOSIS — N390 Urinary tract infection, site not specified: Secondary | ICD-10-CM

## 2018-12-07 MED ORDER — NITROFURANTOIN MONOHYD MACRO 100 MG PO CAPS: 100.0000 mg | ORAL_CAPSULE | Freq: Two times a day (BID) | ORAL | 0 refills | Status: DC

## 2018-12-07 NOTE — Progress Notes (Signed)
We are sorry that you are not feeling well.  Here is how we plan to help!  Based on what you shared with me it looks like you most likely have a simple urinary tract infection.  A UTI (Urinary Tract Infection) is a bacterial infection of the bladder.  Most cases of urinary tract infections are simple to treat but a key part of your care is to encourage you to drink plenty of fluids and watch your symptoms carefully.  I have prescribed MacroBid 100 mg twice a day for 5 days.  Your symptoms should gradually improve. Call us if the burning in your urine worsens, you develop worsening fever, back pain or pelvic pain or if your symptoms do not resolve after completing the antibiotic.  Urinary tract infections can be prevented by drinking plenty of water to keep your body hydrated.  Also be sure when you wipe, wipe from front to back and don't hold it in!  If possible, empty your bladder every 4 hours.  If your symptoms of vaginal discharge continue please follow up with your doctor or submit a separate Evisit.   Your e-visit answers were reviewed by a board certified advanced clinical practitioner to complete your personal care plan.  Depending on the condition, your plan could have included both over the counter or prescription medications.  If there is a problem please reply  once you have received a response from your provider.  Your safety is important to [place].  If you have drug allergies check your prescription carefully.    You can use MyChart to ask questions about today's visit, request a non-urgent call back, or ask for a work or school excuse for 24 hours related to this e-Visit. If it has been greater than 24 hours you will need to follow up with your provider, or enter a new e-Visit to address those concerns.   You will get an e-mail in the next two days asking about your experience.  I hope that your e-visit has been valuable and will speed your recovery. Thank you for using  e-visits.   I spent 5-10 minutes on review and completion of this note- Lacy Duverney [facility]

## 2019-01-03 ENCOUNTER — Emergency Department (HOSPITAL_COMMUNITY): Payer: 59

## 2019-01-03 ENCOUNTER — Other Ambulatory Visit: Payer: Self-pay

## 2019-01-03 ENCOUNTER — Encounter (HOSPITAL_COMMUNITY): Payer: Self-pay | Admitting: Emergency Medicine

## 2019-01-03 ENCOUNTER — Emergency Department (HOSPITAL_COMMUNITY)
Admission: EM | Admit: 2019-01-03 | Discharge: 2019-01-03 | Disposition: A | Discharge status: Home / Self Care | Payer: 59 | Attending: Emergency Medicine | Admitting: Emergency Medicine

## 2019-01-03 DIAGNOSIS — R51 Headache: Secondary | ICD-10-CM | POA: Diagnosis not present

## 2019-01-03 DIAGNOSIS — R519 Headache, unspecified: Secondary | ICD-10-CM

## 2019-01-03 DIAGNOSIS — Z7984 Long term (current) use of oral hypoglycemic drugs: Secondary | ICD-10-CM | POA: Diagnosis not present

## 2019-01-03 DIAGNOSIS — R072 Precordial pain: Secondary | ICD-10-CM | POA: Insufficient documentation

## 2019-01-03 DIAGNOSIS — J45909 Unspecified asthma, uncomplicated: Secondary | ICD-10-CM | POA: Insufficient documentation

## 2019-01-03 DIAGNOSIS — Z79899 Other long term (current) drug therapy: Secondary | ICD-10-CM | POA: Diagnosis not present

## 2019-01-03 DIAGNOSIS — R079 Chest pain, unspecified: Secondary | ICD-10-CM | POA: Diagnosis not present

## 2019-01-03 DIAGNOSIS — Z91010 Allergy to peanuts: Secondary | ICD-10-CM | POA: Insufficient documentation

## 2019-01-03 DIAGNOSIS — R739 Hyperglycemia, unspecified: Secondary | ICD-10-CM

## 2019-01-03 DIAGNOSIS — E1165 Type 2 diabetes mellitus with hyperglycemia: Secondary | ICD-10-CM | POA: Diagnosis not present

## 2019-01-03 DIAGNOSIS — I10 Essential (primary) hypertension: Secondary | ICD-10-CM | POA: Insufficient documentation

## 2019-01-03 LAB — CBC
HCT: 35.8 % — ABNORMAL LOW (ref 36.0–46.0)
Hemoglobin: 11.7 g/dL — ABNORMAL LOW (ref 12.0–15.0)
MCH: 25.5 pg — ABNORMAL LOW (ref 26.0–34.0)
MCHC: 32.7 g/dL (ref 30.0–36.0)
MCV: 78.2 fL — ABNORMAL LOW (ref 80.0–100.0)
Platelets: 327 10*3/uL (ref 150–400)
RBC: 4.58 MIL/uL (ref 3.87–5.11)
RDW: 14.6 % (ref 11.5–15.5)
WBC: 7.6 10*3/uL (ref 4.0–10.5)
nRBC: 0 % (ref 0.0–0.2)

## 2019-01-03 LAB — BASIC METABOLIC PANEL
Anion gap: 10 (ref 5–15)
BUN: 5 mg/dL — ABNORMAL LOW (ref 6–20)
CO2: 25 mmol/L (ref 22–32)
Calcium: 8.9 mg/dL (ref 8.9–10.3)
Chloride: 103 mmol/L (ref 98–111)
Creatinine, Ser: 0.74 mg/dL (ref 0.44–1.00)
GFR calc Af Amer: >60 mL/min (ref >60)
GFR calc non Af Amer: >60 mL/min (ref >60)
Glucose, Bld: 227 mg/dL — ABNORMAL HIGH (ref 70–99)
Potassium: 3.3 mmol/L — ABNORMAL LOW (ref 3.5–5.1)
Sodium: 138 mmol/L (ref 135–145)

## 2019-01-03 LAB — URINALYSIS, ROUTINE W REFLEX MICROSCOPIC
Bacteria, UA: NONE SEEN
Bilirubin Urine: NEGATIVE
Glucose, UA: >=500 mg/dL — AB
Ketones, ur: 5 mg/dL — AB
Leukocytes,Ua: NEGATIVE
Nitrite: NEGATIVE
Protein, ur: 30 mg/dL — AB
Specific Gravity, Urine: 1.031 — ABNORMAL HIGH (ref 1.005–1.030)
pH: 6 (ref 5.0–8.0)

## 2019-01-03 LAB — I-STAT BETA HCG BLOOD, ED (MC, WL, AP ONLY): I-stat hCG, quantitative: <5 m[IU]/mL (ref <5)

## 2019-01-03 LAB — TROPONIN I (HIGH SENSITIVITY): Troponin I (High Sensitivity): 4 ng/L (ref <18)

## 2019-01-03 LAB — CBG MONITORING, ED: Glucose-Capillary: 247 mg/dL — ABNORMAL HIGH (ref 70–99)

## 2019-01-03 MED ORDER — DIPHENHYDRAMINE HCL 50 MG/ML IJ SOLN
25.0000 mg | Freq: Once | INTRAMUSCULAR | Status: AC
  Administered 2019-01-03: 25 mg via INTRAVENOUS
  Filled 2019-01-03: qty 1

## 2019-01-03 MED ORDER — METFORMIN HCL 500 MG PO TABS
1000.0000 mg | ORAL_TABLET | Freq: Once | ORAL | Status: AC
  Administered 2019-01-03: 1000 mg via ORAL
  Filled 2019-01-03: qty 2

## 2019-01-03 MED ORDER — METOCLOPRAMIDE HCL 5 MG/ML IJ SOLN
10.0000 mg | Freq: Once | INTRAMUSCULAR | Status: AC
  Administered 2019-01-03: 10 mg via INTRAVENOUS
  Filled 2019-01-03: qty 2

## 2019-01-03 MED ORDER — SODIUM CHLORIDE 0.9 % IV BOLUS
1000.0000 mL | Freq: Once | INTRAVENOUS | Status: AC
  Administered 2019-01-03: 1000 mL via INTRAVENOUS

## 2019-01-03 NOTE — ED Triage Notes (Signed)
Pt c/o hyperglycemia, increased thirst and dizziness. Pt reports she ran out of her diabetes medications on Friday.

## 2019-01-03 NOTE — ED Notes (Signed)
Patient verbalizes understanding of discharge instructions. Opportunity for questioning and answers were provided. Armband removed by staff, pt discharged from ED.  
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
## 2019-01-03 NOTE — Discharge Instructions (Signed)
Please read and follow all provided instructions.  Your diagnoses today include:  1. Hyperglycemia   2. Precordial pain   3. Acute nonintractable headache, unspecified headache type     Tests performed today include:  An EKG of your heart  A chest x-ray  Cardiac enzymes - a blood test for heart muscle damage  Blood counts and electrolytes - slightly high blood sugars without complications  Vital signs. See below for your results today.   Medications prescribed:   None  Take any prescribed medications only as directed.  Follow-up instructions: Please follow-up with your primary care provider as soon as you can for further evaluation of your symptoms.   Return instructions:  SEEK IMMEDIATE MEDICAL ATTENTION IF:  You have severe chest pain, especially if the pain is crushing or pressure-like and spreads to the arms, back, neck, or jaw, or if you have sweating, nausea (feeling sick to your stomach), or shortness of breath. THIS IS AN EMERGENCY. Don't wait to see if the pain will go away. Get medical help at once. Call 911 or [phone] (operator). DO NOT drive yourself to the hospital.   Your chest pain gets worse and does not go away with rest.   You have an attack of chest pain lasting longer than usual, despite rest and treatment with the medications your caregiver has prescribed.   You wake from sleep with chest pain or shortness of breath.  You feel dizzy or faint.  You have chest pain not typical of your usual pain for which you originally saw your caregiver.   You have any other emergent concerns regarding your health.  Additional Information: Chest pain comes from many different causes. Your caregiver has diagnosed you as having chest pain that is not specific for one problem, but does not require admission.  You are at low risk for an acute heart condition or other serious illness.   Your vital signs today were: BP (!) 141/94 (BP Location: Right Arm)    Pulse 64    Temp  98.2 F (36.8 C) (Oral)    Resp 18    SpO2 98%  If your blood pressure (BP) was elevated above 135/85 this visit, please have this repeated by your doctor within one month. --------------

## 2019-01-04 MED FILL — FREESTYLE LITE TEST STRIP: 50 days supply | Qty: 100 | Fill #0

## 2019-01-04 MED FILL — AMLODIPINE BESYLATE 10 MG T: 10 | 90 days supply | Qty: 90 | Fill #0

## 2019-01-04 MED FILL — SPIRONOLACTONE 25 MG TABLET: 25 | 90 days supply | Qty: 90 | Fill #0

## 2019-01-04 MED FILL — metFORMIN HCL 1000 MG TABS: 1000 | 90 days supply | Qty: 180 | Fill #1

## 2019-01-05 NOTE — Progress Notes (Signed)
Chief Complaint  Patient presents with  . hospital follow up    hospital follow up, patient doing good    Patient was asked to come in today as follow-up from recent ER visit.  She is past due for med check--last seen in March (and due to f/u in 3 months).  She went to ER on 8/9 for elevated blood sugars as well as "feeling bad". She had dizziness, described as lightheadedness, pain in her left chest and neck and upper arm, as well as a headache and nausea.  She indicated to ER provider that she ran out of her metformin 2 days prior (and indicated that her dose was the 500/1000 dose, rather than the higher dose as recommended at her last visit). BP was elevated at 171/105. She was treated with IV fluids and oral metformin.  Her headache didn't improve, didn't worsen.  CXR was normal. EKG was done, no acute changes.  No e/o recurrent pericarditis. Labs done in ER: Glucose 227   Chemistry      Component Value Date/Time   NA 138 01/03/2019 1601   NA 141 08/20/2018 1300   K 3.3 (L) 01/03/2019 1601   CL 103 01/03/2019 1601   CO2 25 01/03/2019 1601   BUN 5 (L) 01/03/2019 1601   BUN 10 08/20/2018 1300   CREATININE 0.74 01/03/2019 1601   CREATININE 0.57 04/30/2017 1340      Component Value Date/Time   CALCIUM 8.9 01/03/2019 1601   ALKPHOS 69 08/20/2018 1300   AST 12 08/20/2018 1300   ALT 12 08/20/2018 1300   BILITOT <0.2 08/20/2018 1300     Lab Results  Component Value Date   WBC 7.6 01/03/2019   HGB 11.7 (L) 01/03/2019   HCT 35.8 (L) 01/03/2019   MCV 78.2 (L) 01/03/2019   PLT 327 01/03/2019   Since ER she is feeling a little better--still has some bouts of dizziness and occasional sharp shooting pains in her chest and neck.  Still has a headache--just slight, aggravating headache.  She continues to have left sided neck pain, which shoots down the left arm, and has some tingling in the arm (not into fingers).  It has gotten a little better since Sunday. Pain started 2 days after  starting a sit-up challenge with her husband.  Hypertension:  She reports compliance with amlodipine and spironolactone.  BP at her last visit in our office was high (possibly due to higher sodium intake), but had been lower when monitored at home.  She lost her BP cuff, hasn't been able to check at home. Denies any missed pills. She is stressed--husband fell, had injuries, was hospitalized.  She has a lot more on her plate  Diabetes:At her visit in March, A1c was elevated at 8.0 (down from 9% in 03/2018), when taking a total of 1500mg  of metformin daily, along with [place]. She hadn't been checking sugars.  Her dose was increased to 1000mg  BID (and said that if sugars remained high, or if she didn't tolerate metformin, we may need to add med, such as Onglyza or Januvia). She didn't tolerate higher doses of metformin (due to diarrhea). She is back to taking 500/1000. She was still seeing high sugars, so she changed to taking Farxiga in the morning, 500mg  of metformin at lunch, and taking 1000mg  at dinner.  Doing this pattern for the last couple of days, and reports that her sugars are a little lower. Sugars had been running 180's-190's in the morning, fasting--down to 140's the  past few days. She has also cut out potatoes/starches in the last few days, cut back on chips and snacking.    Lab Results  Component Value Date   HGBA1C 8.0 (A) 08/20/2018   She checks her feet regularly and denies numbness, tingling, sores, concerns  Has some tingling in her left arm, shooting down from the neck, as reported above.  Contraception:  This was discussed in detail at her last visit, given that she should be on ACEI or ARB and on statin for management of her diabetes.   They have been using condoms regularly. Agreeable to start medications as recommended. She realizes how much she has on her plate, and is not desiring pregnancy currently. She previously had a miscarriage when on losartan in the past, is  well aware of the risks, and states will continue to be compliant with condom use.  H/oVitamin D deficiency: Last level was low at 13.9 in 07/2018; at that time she was only taking MVI.  She was treated with add'l weekly rx Vitamin D. She is currently taking just the MVI, no additional vitamin D3.  OSA: She hasbeen using her CPAP nightly.  Lab Results  Component Value Date   CHOL 191 08/20/2018   HDL 57 08/20/2018   LDLCALC 111 (H) 08/20/2018   TRIG 116 08/20/2018   CHOLHDL 3.4 08/20/2018   PMH, PSH, SH reviewed  Outpatient Encounter Medications as of 01/07/2019  Medication Sig Note  . albuterol (PROVENTIL) (2.5 MG/3ML) 0.083% nebulizer solution Take 3 mLs (2.5 mg total) by nebulization every 6 (six) hours as needed for wheezing or shortness of breath.   Marland Kitchen amLODipine (NORVASC) 10 MG tablet Take 1 tablet (10 mg total) by mouth at bedtime.   . dapagliflozin propanediol (FARXIGA) 10 MG TABS tablet Take 10 mg by mouth daily.   Marland Kitchen glucose blood (FREESTYLE LITE) test strip USE AS DIRECTED 2 (TWO) TIMES DAILY   . metFORMIN (GLUCOPHAGE) 1000 MG tablet Take 1 tablet (1,000 mg total) by mouth 2 (two) times daily with a meal. (Patient taking differently: Take 500-1,000 mg by mouth 2 (two) times daily with a meal. 500mg  in the morning and 1000mg  at night) 01/03/2019: .  . potassium chloride (K-DUR,KLOR-CON) 10 MEQ tablet Take 1 tablet (10 mEq total) by mouth daily.   Marland Kitchen spironolactone (ALDACTONE) 25 MG tablet Take 1 tablet (25 mg total) by mouth daily.   Marland Kitchen atorvastatin (LIPITOR) 20 MG tablet Take 1 tablet (20 mg total) by mouth daily.   Marland Kitchen ibuprofen (ADVIL) 200 MG tablet Take 800 mg by mouth every 6 (six) hours as needed for headache or moderate pain. 01/07/2019: Taking 800mg  daily prn; hasn't taken yet today  . losartan (COZAAR) 25 MG tablet Take 1 tablet (25 mg total) by mouth daily.   . Multiple Vitamins-Minerals (WOMENS DAILY FORMULA PO) Take 1 tablet by mouth daily.   . sitaGLIPtin (JANUVIA) 50 MG  tablet Take 1 tablet (50 mg total) by mouth daily.   . Vitamin D, Ergocalciferol, (DRISDOL) 1.25 MG (50000 UT) CAPS capsule Take 1 capsule (50,000 Units total) by mouth every 7 (seven) days.   . [DISCONTINUED] ipratropium (ATROVENT) 0.06 % nasal spray Place 2 sprays into both nostrils 4 (four) times daily. (Patient not taking: Reported on 01/03/2019) 08/20/2018: Uses prn   No facility-administered encounter medications on file as of 01/07/2019.    (NOT taking januvia, rx vitamin D, losartan or atorvastatin prior to visit today)  Allergies  Allergen Reactions  . Dilaudid [Hydromorphone Hcl]  Hives  . Morphine And Related Hives  . Peanut-Containing Drug Products Hives  . Strawberry Extract Swelling    Swelling is of the eye.    ROS: no fever, chills, URI symptoms. No nausea, vomiting or diarrhea (not since metformin dose reduced)  Nausea resolved. Headaches--across forehead, and temples. Chest pain--sharp, short-lived, not exertional. No polydipsia, polyuria. +neck pain, shooting down the left arm, with tingling. See HPI.   PHYSICAL EXAM:  BP (!) 150/100   Pulse 88   Temp 97.6 F (36.4 C) (Temporal)   Resp 16   Ht 5\' 4"  (1.626 m)   Wt 295 lb 9.6 oz (134.1 kg)   LMP 12/21/2018 (Exact Date)   SpO2 98%   BMI 50.74 kg/m   Wt Readings from Last 3 Encounters:  08/20/18 296 lb 12.8 oz (134.6 kg)  07/08/18 300 lb (136.1 kg)  07/06/18 292 lb (132.5 kg)   BP Readings from Last 3 Encounters:  01/03/19 (!) 151/102  09/22/18 (!) 143/90  09/05/18 (!) 145/89   144/96 on repeat by MD  Well-appearing, pleasant, obese female, in no distress HEENT: conjunctiva and sclera are clear, EOMI.  Wearing mask due to COVID-19. Neck: spine is nontender.  She has FROM of neck Pain with looking to the right. Tender at L SCM. No paraspinous or trapezius spasm. No lymphadenopathy, thyromegaly or carotid bruit Heart: regular rate and rhythm Lungs: clear bilaterally Back: no spinal or CVA  tenderness Abdomen: obese, soft. She is tender at left inferior ribs and upper ab muscles on the left. No rebound, guarding, masses Extremities: no edema Neuro: she is alert, oriented, EOMI. Normal strength, gait, DTR's. Psych: normal mood, affect, hygiene and grooming Skin: normal turgor, no rash    ASSESSMENT/PLAN:  Essential hypertension - above goal today, has some HA. Add ARB also due to microalbuminuria - Plan: losartan (COZAAR) 25 MG tablet  Diabetes mellitus type 2 with complications ([place]) - poorly controlled; improved since diet improved. A1c came back high (after visit)--add [place]. Cont Metformin (try 500/500/1000 to see if tolerated),farxiga   Type 2 diabetes mellitus not at goal [facility]) - Plan: atorvastatin (LIPITOR) 20 MG tablet, Hemoglobin A1c, sitaGLIPtin (JANUVIA) 50 MG tablet  Hypokalemia - noted in ER. Cont spironolactone; since losartan is being added, will hold the potassium supplement, and recheck in 2 weeks - Plan: Basic metabolic panel  Vitamin D deficiency - suspect will be low, not compliant with D3 supplement - Plan: VITAMIN D 25 Hydroxy (Vit-D Deficiency, Fractures), Vitamin D, Ergocalciferol, (DRISDOL) 1.25 MG (50000 UT) CAPS capsule  Microalbuminuria - start losartan. Pt will be compliant with condoms, and discussed Plan B if forgotten/broken - Plan: losartan (COZAAR) 25 MG tablet  Headache, unspecified headache type - There is at least a component related to neck muscles, likely related to poor form with push-up challenge (also as sore abs)   Counseled extensively re: need for contraception if going to take ARB and statin.  She previously had miscarriage, got pregnant while taking losartan in the past, so is well-aware of the risks.  We discussed plan B, to use if needed.   Since many new meds being started, recommended holding off on the atorvastatin for at least 2 weeks, rather than starting new meds all at once.  Vitamin D rx and Januvia were started  after labs returned, so not discussed directly at visit (suggested that this may be needed, depending on lab results).  Visit length at least 40 mins today, more than 1/2 spent counseling.  Take ibuprofen 800 mg every 8 hours (three times daily, WITH FOOD) until your neck pain resolves, or up to 10 days maximum.  This should decrease the inflammation of the nerve and help with neck and arm pain.  Heat, stretches and massage can help. You need to be sure your neck is not being strained with situps--I'd take a break from them, and use caution with your technique, and support your neck, if you restart.  Your BP may be high related to your neck pain.  Start losartan 25 mg once daily. Stop the potassium pill since it can increase your potassium levels. Return in 2 weeks for blood pressure check and bloodwork to make sure that your potassium is okay.  Monitor your blood pressure elsewhere (ie at work or get new cuff). If your blood pressure is dropping too low, send [place] a message and we can cut back one of the other medications.   Change your metformin to taking the 1/2 tablet with breakfast, and take the [place] with lunch (reverse your current order).  Continue the full tablet with dinner (1000mg ).  If your A1c is over 7, I'm going to ask that you TRY to get the other 1/2 tablet of metformin in during the day--ie take 1/2 with breakfast, 1/2 with lunch, and full with dinner (2 pills daily). If you have any loose stools, try adding metamucil.  In a few weeks, once your medications changes are stable, start taking the atorvastatin (lipitor) once daily.  If you develop any muscle pains from this medication, then add in coenzyme Q10 once daily.  We are rechecking your vitamin D--I suspect it may be low again since we have seen that the multivitamin didn't have enough in the past.  If it is only slightly low, we will have you start taking 1000 IU of a separate vitamin D3 once daily, and continue  this, along with the multivitamin, long-term. If it is VERY low, we will also give you a prescription (and you can wait to start the 1000 IU separately until you finish the prescription.

## 2019-01-07 ENCOUNTER — Encounter: Payer: Self-pay | Admitting: Family Medicine

## 2019-01-07 ENCOUNTER — Other Ambulatory Visit: Payer: Self-pay

## 2019-01-07 ENCOUNTER — Ambulatory Visit (INDEPENDENT_AMBULATORY_CARE_PROVIDER_SITE_OTHER): Payer: 59 | Admitting: Family Medicine

## 2019-01-07 VITALS — BP 144/96 | HR 88 | Temp 97.6°F | Resp 16 | Ht 64.0 in | Wt 295.6 lb

## 2019-01-07 DIAGNOSIS — E119 Type 2 diabetes mellitus without complications: Secondary | ICD-10-CM | POA: Diagnosis not present

## 2019-01-07 DIAGNOSIS — E559 Vitamin D deficiency, unspecified: Secondary | ICD-10-CM | POA: Diagnosis not present

## 2019-01-07 DIAGNOSIS — E876 Hypokalemia: Secondary | ICD-10-CM | POA: Diagnosis not present

## 2019-01-07 DIAGNOSIS — R51 Headache: Secondary | ICD-10-CM | POA: Diagnosis not present

## 2019-01-07 DIAGNOSIS — R519 Headache, unspecified: Secondary | ICD-10-CM

## 2019-01-07 DIAGNOSIS — E118 Type 2 diabetes mellitus with unspecified complications: Secondary | ICD-10-CM

## 2019-01-07 DIAGNOSIS — R809 Proteinuria, unspecified: Secondary | ICD-10-CM | POA: Diagnosis not present

## 2019-01-07 DIAGNOSIS — I10 Essential (primary) hypertension: Secondary | ICD-10-CM | POA: Diagnosis not present

## 2019-01-07 MED ORDER — LOSARTAN POTASSIUM 25 MG PO TABS: 25.0000 mg | ORAL_TABLET | Freq: Every day | ORAL | 0 refills | Status: DC

## 2019-01-07 MED ORDER — ATORVASTATIN CALCIUM 20 MG PO TABS: 20.0000 mg | ORAL_TABLET | Freq: Every day | ORAL | 3 refills | Status: DC

## 2019-01-07 MED FILL — ATORVASTATIN 20 MG TABLET: 20 | 30 days supply | Qty: 30 | Fill #0

## 2019-01-07 MED FILL — LOSARTAN POTASSIUM 25 MG TA: 25 | 30 days supply | Qty: 30 | Fill #0

## 2019-01-07 NOTE — Patient Instructions (Signed)
  Take ibuprofen 800 mg every 8 hours (three times daily, WITH FOOD) until your neck pain resolves, or up to 10 days maximum.  This should decrease the inflammation of the nerve and help with neck and arm pain.  Heat, stretches and massage can help. You need to be sure your neck is not being strained with situps--I'd take a break from them, and use caution with your technique, and support your neck, if you restart.  Your BP may be high related to your neck pain.  Start losartan 25 mg once daily. Stop the potassium pill since it can increase your potassium levels. Return in 2 weeks for blood pressure check and bloodwork to make sure that your potassium is okay.  Monitor your blood pressure elsewhere (ie at work or get new cuff). If your blood pressure is dropping too low, send [place] a message and we can cut back one of the other medications.   Change your metformin to taking the 1/2 tablet with breakfast, and take the [place] with lunch (reverse your current order).  Continue the full tablet with dinner (1000mg ).  If your A1c is over 7, I'm going to ask that you TRY to get the other 1/2 tablet of metformin in during the day--ie take 1/2 with breakfast, 1/2 with lunch, and full with dinner (2 pills daily). If you have any loose stools, try adding metamucil.  In a few weeks, once your medications changes are stable, start taking the atorvastatin (lipitor) once daily.  If you develop any muscle pains from this medication, then add in coenzyme Q10 once daily.  We are rechecking your vitamin D--I suspect it may be low again since we have seen that the multivitamin didn't have enough in the past.  If it is only slightly low, we will have you start taking 1000 IU of a separate vitamin D3 once daily, and continue this, along with the multivitamin, long-term. If it is VERY low, we will also give you a prescription (and you can wait to start the 1000 IU separately until you finish the prescription.

## 2019-01-08 LAB — HEMOGLOBIN A1C
Est. average glucose Bld gHb Est-mCnc: 192 mg/dL
Hgb A1c MFr Bld: 8.3 % — ABNORMAL HIGH (ref 4.8–5.6)

## 2019-01-08 LAB — BASIC METABOLIC PANEL
BUN/Creatinine Ratio: 14 (ref 9–23)
BUN: 9 mg/dL (ref 6–20)
CO2: 17 mmol/L — ABNORMAL LOW (ref 20–29)
Calcium: 9.5 mg/dL (ref 8.7–10.2)
Chloride: 104 mmol/L (ref 96–106)
Creatinine, Ser: 0.65 mg/dL (ref 0.57–1.00)
GFR calc Af Amer: 133 mL/min/{1.73_m2} (ref >59)
GFR calc non Af Amer: 115 mL/min/{1.73_m2} (ref >59)
Glucose: 133 mg/dL — ABNORMAL HIGH (ref 65–99)
Potassium: 4 mmol/L (ref 3.5–5.2)
Sodium: 140 mmol/L (ref 134–144)

## 2019-01-08 LAB — VITAMIN D 25 HYDROXY (VIT D DEFICIENCY, FRACTURES): Vit D, 25-Hydroxy: 18.1 ng/mL — ABNORMAL LOW (ref 30.0–100.0)

## 2019-01-08 MED ORDER — SITAGLIPTIN PHOSPHATE 50 MG PO TABS: 50.0000 mg | ORAL_TABLET | Freq: Every day | ORAL | 2 refills | Status: DC

## 2019-01-08 MED ORDER — VITAMIN D (ERGOCALCIFEROL) 1.25 MG (50000 UNIT) PO CAPS: 50000.0000 [IU] | ORAL_CAPSULE | ORAL | 0 refills | Status: DC

## 2019-01-08 MED FILL — JANUVIA 50 MG TABLET: 50 | 30 days supply | Qty: 30 | Fill #0

## 2019-01-08 MED FILL — VIT D2 1.25 MG (50,000 UNIT: 1.25 MG | 84 days supply | Qty: 12 | Fill #0

## 2019-01-20 ENCOUNTER — Other Ambulatory Visit: Payer: 59

## 2019-01-20 ENCOUNTER — Telehealth: Payer: Self-pay | Admitting: *Deleted

## 2019-01-20 ENCOUNTER — Other Ambulatory Visit: Payer: Self-pay

## 2019-01-20 ENCOUNTER — Other Ambulatory Visit: Payer: Self-pay | Admitting: Family Medicine

## 2019-01-20 DIAGNOSIS — I10 Essential (primary) hypertension: Secondary | ICD-10-CM

## 2019-01-20 DIAGNOSIS — Z5181 Encounter for therapeutic drug level monitoring: Secondary | ICD-10-CM

## 2019-01-20 NOTE — Telephone Encounter (Signed)
Patient was here for labs and bp check. Bp was 142/98.

## 2019-01-20 NOTE — Telephone Encounter (Signed)
Noted. Await lab results--if okay, will increase her losartan dose

## 2019-01-21 LAB — BASIC METABOLIC PANEL
BUN/Creatinine Ratio: 16 (ref 9–23)
BUN: 13 mg/dL (ref 6–20)
CO2: 19 mmol/L — ABNORMAL LOW (ref 20–29)
Calcium: 9.4 mg/dL (ref 8.7–10.2)
Chloride: 104 mmol/L (ref 96–106)
Creatinine, Ser: 0.8 mg/dL (ref 0.57–1.00)
GFR calc Af Amer: 110 mL/min/{1.73_m2} (ref >59)
GFR calc non Af Amer: 96 mL/min/{1.73_m2} (ref >59)
Glucose: 113 mg/dL — ABNORMAL HIGH (ref 65–99)
Potassium: 4.2 mmol/L (ref 3.5–5.2)
Sodium: 139 mmol/L (ref 134–144)

## 2019-01-21 NOTE — Telephone Encounter (Signed)
Spoke with patient and she has not checked any bp readings, ONLY the one she had here. So wasn't sure if you wanted to continue with plan. Please advise.

## 2019-01-21 NOTE — Telephone Encounter (Signed)
Patient advised. She will call me back if she goes up to the 50mg  and I will call in #30 of the 50mg  and set up appt for in person bp follow up in 3-4 weeks.

## 2019-01-21 NOTE — Telephone Encounter (Signed)
See if pt has checked elsewhere, and if they have been in the same (high) range.  If so, advise her to increase her losartan to 50mg  (take 2 of the 25mg  tablets, send in new rx for 50mg  tablet #30), and schedule f/u visit in 3-4 weeks (in person OV)

## 2019-01-21 NOTE — Telephone Encounter (Signed)
If she has the ability to check, and finds they are lower at home, can stay on the 25mg  for longer.  If unable to check elsewhere, then increase to 50mg  (and be sure to get checked if she has any dizziness, and to let us know). Hope that clarifies.

## 2019-02-09 MED FILL — LOSARTAN POTASSIUM 25 MG TA: 25 | 30 days supply | Qty: 30 | Fill #0

## 2019-02-09 MED FILL — ATORVASTATIN 20 MG TABLET: 20 | 30 days supply | Qty: 30 | Fill #0

## 2019-02-09 MED FILL — JANUVIA 50 MG TABLET: 50 | 30 days supply | Qty: 30 | Fill #0

## 2019-02-12 ENCOUNTER — Ambulatory Visit (INDEPENDENT_AMBULATORY_CARE_PROVIDER_SITE_OTHER): Payer: 59

## 2019-02-12 ENCOUNTER — Encounter (HOSPITAL_COMMUNITY): Payer: Self-pay

## 2019-02-12 ENCOUNTER — Ambulatory Visit (HOSPITAL_COMMUNITY)
Admission: EM | Admit: 2019-02-12 | Discharge: 2019-02-12 | Disposition: A | Discharge status: Home / Self Care | Payer: 59 | Attending: Family Medicine | Admitting: Family Medicine

## 2019-02-12 DIAGNOSIS — S9001XA Contusion of right ankle, initial encounter: Secondary | ICD-10-CM | POA: Diagnosis not present

## 2019-02-12 DIAGNOSIS — M25571 Pain in right ankle and joints of right foot: Secondary | ICD-10-CM | POA: Diagnosis not present

## 2019-02-12 MED ORDER — IBUPROFEN 800 MG PO TABS: 800.0000 mg | ORAL_TABLET | Freq: Three times a day (TID) | ORAL | 0 refills | Status: DC | PRN

## 2019-02-12 MED ORDER — IBUPROFEN 800 MG PO TABS
ORAL_TABLET | ORAL | Status: AC
  Filled 2019-02-12: qty 1

## 2019-02-12 MED ORDER — IBUPROFEN 800 MG PO TABS
800.0000 mg | ORAL_TABLET | Freq: Once | ORAL | Status: AC
  Administered 2019-02-12: 800 mg via ORAL

## 2019-02-12 MED FILL — IBUPROFEN 800 MG TABS: 800 | 7 days supply | Qty: 21 | Fill #0

## 2019-02-12 NOTE — ED Triage Notes (Signed)
Patient report she injured her right ankle this morning with a metal piece of her bed, is swollen and painful, she has not take any medication for the pain.
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
## 2019-02-12 NOTE — Discharge Instructions (Signed)
Xray is normal today which is reassuring. Consistent with bruising to the area.  Ice, elevation, ibuprofen to help with pain.  Activity as tolerated.  This should heal up in the next 2 weeks, avoid repeat trauma to the area as able.

## 2019-02-13 MED FILL — FREESTYLE LITE TEST STRIP: 50 days supply | Qty: 100 | Fill #1

## 2019-02-16 MED FILL — OMRON 3 SERIES BP MONITOR D: 30 days supply | Qty: 1 | Fill #0

## 2019-02-21 ENCOUNTER — Emergency Department (HOSPITAL_COMMUNITY): Payer: 59

## 2019-02-21 ENCOUNTER — Telehealth: Payer: 59 | Admitting: Family

## 2019-02-21 ENCOUNTER — Encounter (HOSPITAL_COMMUNITY): Payer: Self-pay | Admitting: Emergency Medicine

## 2019-02-21 ENCOUNTER — Other Ambulatory Visit: Payer: Self-pay

## 2019-02-21 ENCOUNTER — Emergency Department (HOSPITAL_COMMUNITY)
Admission: EM | Admit: 2019-02-21 | Discharge: 2019-02-21 | Disposition: A | Discharge status: Home / Self Care | Payer: 59 | Attending: Emergency Medicine | Admitting: Emergency Medicine

## 2019-02-21 DIAGNOSIS — R300 Dysuria: Secondary | ICD-10-CM | POA: Diagnosis present

## 2019-02-21 DIAGNOSIS — R109 Unspecified abdominal pain: Secondary | ICD-10-CM

## 2019-02-21 DIAGNOSIS — J45909 Unspecified asthma, uncomplicated: Secondary | ICD-10-CM | POA: Insufficient documentation

## 2019-02-21 DIAGNOSIS — E119 Type 2 diabetes mellitus without complications: Secondary | ICD-10-CM | POA: Insufficient documentation

## 2019-02-21 DIAGNOSIS — Z7984 Long term (current) use of oral hypoglycemic drugs: Secondary | ICD-10-CM | POA: Diagnosis not present

## 2019-02-21 DIAGNOSIS — Z79899 Other long term (current) drug therapy: Secondary | ICD-10-CM | POA: Insufficient documentation

## 2019-02-21 DIAGNOSIS — I10 Essential (primary) hypertension: Secondary | ICD-10-CM | POA: Diagnosis not present

## 2019-02-21 DIAGNOSIS — R319 Hematuria, unspecified: Secondary | ICD-10-CM | POA: Diagnosis not present

## 2019-02-21 DIAGNOSIS — Z91010 Allergy to peanuts: Secondary | ICD-10-CM | POA: Insufficient documentation

## 2019-02-21 DIAGNOSIS — R55 Syncope and collapse: Secondary | ICD-10-CM | POA: Insufficient documentation

## 2019-02-21 DIAGNOSIS — N12 Tubulo-interstitial nephritis, not specified as acute or chronic: Secondary | ICD-10-CM | POA: Insufficient documentation

## 2019-02-21 DIAGNOSIS — R399 Unspecified symptoms and signs involving the genitourinary system: Secondary | ICD-10-CM

## 2019-02-21 LAB — URINALYSIS, ROUTINE W REFLEX MICROSCOPIC
Bilirubin Urine: NEGATIVE
Glucose, UA: >=500 mg/dL — AB
Ketones, ur: NEGATIVE mg/dL
Nitrite: NEGATIVE
Protein, ur: 100 mg/dL — AB
Specific Gravity, Urine: 1.012 (ref 1.005–1.030)
WBC, UA: >50 WBC/hpf — ABNORMAL HIGH (ref 0–5)
pH: 5 (ref 5.0–8.0)

## 2019-02-21 LAB — CBC WITH DIFFERENTIAL/PLATELET
Abs Immature Granulocytes: 0.05 10*3/uL (ref 0.00–0.07)
Basophils Absolute: 0 10*3/uL (ref 0.0–0.1)
Basophils Relative: 0 %
Eosinophils Absolute: 0.1 10*3/uL (ref 0.0–0.5)
Eosinophils Relative: 1 %
HCT: 33.7 % — ABNORMAL LOW (ref 36.0–46.0)
Hemoglobin: 11 g/dL — ABNORMAL LOW (ref 12.0–15.0)
Immature Granulocytes: 1 %
Lymphocytes Relative: 11 %
Lymphs Abs: 0.9 10*3/uL (ref 0.7–4.0)
MCH: 25.7 pg — ABNORMAL LOW (ref 26.0–34.0)
MCHC: 32.6 g/dL (ref 30.0–36.0)
MCV: 78.7 fL — ABNORMAL LOW (ref 80.0–100.0)
Monocytes Absolute: 0.6 10*3/uL (ref 0.1–1.0)
Monocytes Relative: 8 %
Neutro Abs: 6.2 10*3/uL (ref 1.7–7.7)
Neutrophils Relative %: 79 %
Platelets: 308 10*3/uL (ref 150–400)
RBC: 4.28 MIL/uL (ref 3.87–5.11)
RDW: 14.3 % (ref 11.5–15.5)
WBC: 7.8 10*3/uL (ref 4.0–10.5)
nRBC: 0 % (ref 0.0–0.2)

## 2019-02-21 LAB — COMPREHENSIVE METABOLIC PANEL
ALT: 16 U/L (ref 0–44)
AST: 16 U/L (ref 15–41)
Albumin: 3.5 g/dL (ref 3.5–5.0)
Alkaline Phosphatase: 62 U/L (ref 38–126)
Anion gap: 9 (ref 5–15)
BUN: 13 mg/dL (ref 6–20)
CO2: 25 mmol/L (ref 22–32)
Calcium: 9.3 mg/dL (ref 8.9–10.3)
Chloride: 104 mmol/L (ref 98–111)
Creatinine, Ser: 0.97 mg/dL (ref 0.44–1.00)
GFR calc Af Amer: >60 mL/min (ref >60)
GFR calc non Af Amer: >60 mL/min (ref >60)
Glucose, Bld: 146 mg/dL — ABNORMAL HIGH (ref 70–99)
Potassium: 3.6 mmol/L (ref 3.5–5.1)
Sodium: 138 mmol/L (ref 135–145)
Total Bilirubin: 0.6 mg/dL (ref 0.3–1.2)
Total Protein: 7 g/dL (ref 6.5–8.1)

## 2019-02-21 LAB — I-STAT BETA HCG BLOOD, ED (MC, WL, AP ONLY): I-stat hCG, quantitative: <5 m[IU]/mL (ref <5)

## 2019-02-21 LAB — LACTIC ACID, PLASMA: Lactic Acid, Venous: 1.5 mmol/L (ref 0.5–1.9)

## 2019-02-21 MED ORDER — SODIUM CHLORIDE 0.9% FLUSH: 3.0000 mL | Freq: Once | INTRAVENOUS | Status: DC

## 2019-02-21 MED ORDER — FENTANYL CITRATE (PF) 100 MCG/2ML IJ SOLN
50.0000 ug | Freq: Once | INTRAMUSCULAR | Status: AC
  Administered 2019-02-21: 16:00:00 50 ug via INTRAVENOUS
  Filled 2019-02-21: qty 2

## 2019-02-21 MED ORDER — ACETAMINOPHEN 325 MG PO TABS
650.0000 mg | ORAL_TABLET | Freq: Once | ORAL | Status: AC
  Administered 2019-02-21: 16:00:00 650 mg via ORAL
  Filled 2019-02-21: qty 2

## 2019-02-21 MED ORDER — FENTANYL CITRATE (PF) 100 MCG/2ML IJ SOLN
25.0000 ug | Freq: Once | INTRAMUSCULAR | Status: AC
  Administered 2019-02-21: 25 ug via INTRAVENOUS
  Filled 2019-02-21: qty 2

## 2019-02-21 MED ORDER — SODIUM CHLORIDE 0.9 % IV SOLN
1.0000 g | Freq: Once | INTRAVENOUS | Status: AC
  Administered 2019-02-21: 16:00:00 1 g via INTRAVENOUS
  Filled 2019-02-21: qty 10

## 2019-02-21 MED ORDER — SODIUM CHLORIDE 0.9 % IV BOLUS
500.0000 mL | Freq: Once | INTRAVENOUS | Status: AC
  Administered 2019-02-21: 500 mL via INTRAVENOUS

## 2019-02-21 MED ORDER — CEPHALEXIN 500 MG PO CAPS: 500.0000 mg | ORAL_CAPSULE | Freq: Four times a day (QID) | ORAL | 0 refills | Status: AC

## 2019-02-21 MED ORDER — SODIUM CHLORIDE 0.9 % IV BOLUS
1000.0000 mL | Freq: Once | INTRAVENOUS | Status: AC
  Administered 2019-02-21: 1000 mL via INTRAVENOUS

## 2019-02-21 MED ORDER — FENTANYL CITRATE (PF) 100 MCG/2ML IJ SOLN
50.0000 ug | Freq: Once | INTRAMUSCULAR | Status: AC
  Administered 2019-02-21: 50 ug via INTRAVENOUS
  Filled 2019-02-21: qty 2

## 2019-02-21 NOTE — ED Triage Notes (Signed)
Pt arrives from work at [employer] reporting of dysuria since Friday with hematuria, reports fever this am and witnessed LOC without fall.

## 2019-02-21 NOTE — Progress Notes (Signed)
Based on what you shared with me, I feel your condition warrants further evaluation and I recommend that you be seen for a face to face office visit.  Given your symptoms of UTI with back pain, you need to be seen face to face to rule out a more serious infection.    NOTE: If you entered your credit card information for this eVisit, you will not be charged. You may see a "hold" on your card for the $35 but that hold will drop off and you will not have a charge processed.  If you are having a true medical emergency please call 911.     For an urgent face to face visit, [facility] has four urgent care centers for your convenience:   . [facility]    [phone]                  Get Driving Directions  [phone] [place], [place] [address] . 10 am to 8 pm Monday-Friday . 12 pm to 8 pm Saturday-Sunday   . [facility] at [place]                  Get Driving Directions  [phone] [place], [place], [place] [address] . 8 am to 8 pm Monday-Friday . 9 am to 6 pm Saturday . 11 am to 6 pm Sunday   . [facility] at [place]                  Get Driving Directions   [address] [place], [place] [address] . 8 am to 8 pm Monday-Friday . 8 am to 4 pm Saturday-Sunday    . [facility] at [place]                    Get Driving Directions  [id number]  [address] [place], [place] [address]  . Monday-Friday, 12 PM to 6 PM    Your e-visit answers were reviewed by a board certified advanced clinical practitioner to complete your personal care plan.  Thank you for using e-Visits.

## 2019-02-21 NOTE — Discharge Instructions (Signed)
Take the antibiotics as prescribed. If you need to be on a different antibiotic we will call you. Return to the ED if you start to experience worsening symptoms, increased vomiting, lightheadedness, loss of consciousness, chest pain or shortness of breath.

## 2019-02-21 NOTE — ED Notes (Signed)
Patient transported to CT 

## 2019-02-21 NOTE — ED Notes (Signed)
Patient verbalizes understanding of discharge instructions. Opportunity for questioning and answers were provided. Armband removed by staff, pt discharged from ED.  
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
## 2019-02-22 LAB — URINE CULTURE

## 2019-02-24 NOTE — Progress Notes (Signed)
Chief Complaint  Patient presents with  . Hospitalization Follow-up    ER follow up. Patient states that the burning sensation is gone when she urinates and today was the first day she could actually sit up for a while without having to lay down, but not for very long.     Patient presents for ER follow-up for pyelonephritis.  She was seen in the ED on 02/21/2019. At that time she reported back pain x 6 days, hematuria and dysuria x 2 days.  She had brief syncopal episode at work that day she was seen.  She was febrile (T 101.2), was tachycardic, and had bilateral CVA tenderness on exam.  She was treated with fluids, IV ABX, pain meds, and was discharged on Keflex. CT was done: IMPRESSION: 1. No renal stones or obstruction identified. No cause for hematuria is seen. Urine culture revealed: MULTIPLE SPECIES PRESENT, SUGGEST RECOLLECTION Urine dip: large leuks, nitrite negative, mod blood, +protein. SG 1.012, no ketones.  Micro revealed 11-20 RBC, >50 WBC, rare bacteria Lab Results  Component Value Date   WBC 7.8 02/21/2019   HGB 11.0 (L) 02/21/2019   HCT 33.7 (L) 02/21/2019   MCV 78.7 (L) 02/21/2019   PLT 308 02/21/2019     Chemistry      Component Value Date/Time   NA 138 02/21/2019 1433   NA 139 01/20/2019 0853   K 3.6 02/21/2019 1433   CL 104 02/21/2019 1433   CO2 25 02/21/2019 1433   BUN 13 02/21/2019 1433   BUN 13 01/20/2019 0853   CREATININE 0.97 02/21/2019 1433   CREATININE 0.57 04/30/2017 1340      Component Value Date/Time   CALCIUM 9.3 02/21/2019 1433   ALKPHOS 62 02/21/2019 1433   AST 16 02/21/2019 1433   ALT 16 02/21/2019 1433   BILITOT 0.6 02/21/2019 1433   BILITOT <0.2 08/20/2018 1300     Glucose 146 Normal lactic acid  "I feel a whole lot better" --no longer has burning with urination, back pain improved, fever resolved. She continues to feel lightheaded.  It has improved, but not resolved. Earlier in the week she felt like she would pass out when she  stood up. Today she was able to sit up, and not feel like she had to lay back down. She is trying to drink a lot of water.  Menses are very heavy, ended 2 days prior to ER visit. She doesn't take any iron. Denies any palpitations, tachycardia   Hypertension: She reports she "changed to losartan from amlodipine due to headaches", per pt. In fact, she was supposed to ADD losartan to her regimen of spironolactone and amlodipine.  Hasn't been taking amlodipine. BP Readings from Last 3 Encounters:  02/25/19 (!) 140/108  02/21/19 (!) 147/100  02/12/19 (!) 132/96    DM--she forgot to take her [place] yesterday, hasn't yet taken today (only admitted after reported no sugar in the urine today). She has been on [place] for a couple of months. Sugars have improved. Morning sugars are 105-120, 80's later in the day, max of 140's postprandial.  PMH, PSH, SH reviewed  Outpatient Encounter Medications as of 02/25/2019  Medication Sig Note  . atorvastatin (LIPITOR) 20 MG tablet Take 1 tablet (20 mg total) by mouth daily.   . cephALEXin (KEFLEX) 500 MG capsule Take 1 capsule (500 mg total) by mouth 4 (four) times daily for 10 days.   Marland Kitchen glucose blood (FREESTYLE LITE) test strip USE AS DIRECTED 2 (TWO) TIMES DAILY   .  ibuprofen (ADVIL) 800 MG tablet Take 1 tablet (800 mg total) by mouth 3 (three) times daily as needed.   Marland Kitchen losartan (COZAAR) 25 MG tablet Take 1 tablet (25 mg total) by mouth daily.   . metFORMIN (GLUCOPHAGE) 1000 MG tablet Take 1 tablet (1,000 mg total) by mouth 2 (two) times daily with a meal. (Patient taking differently: Take 500-1,000 mg by mouth 2 (two) times daily with a meal. 500mg  in the morning and 1000mg  at night) 01/03/2019: .  Marland Kitchen Multiple Vitamins-Minerals (WOMENS DAILY FORMULA PO) Take 1 tablet by mouth daily.   . sitaGLIPtin (JANUVIA) 50 MG tablet Take 1 tablet (50 mg total) by mouth daily.   Marland Kitchen spironolactone (ALDACTONE) 25 MG tablet Take 1 tablet (25 mg total) by mouth daily.    . [DISCONTINUED] potassium chloride (K-DUR,KLOR-CON) 10 MEQ tablet Take 1 tablet (10 mEq total) by mouth daily.   Marland Kitchen albuterol (PROVENTIL) (2.5 MG/3ML) 0.083% nebulizer solution Take 3 mLs (2.5 mg total) by nebulization every 6 (six) hours as needed for wheezing or shortness of breath. (Patient not taking: Reported on 02/25/2019)   . amLODipine (NORVASC) 10 MG tablet Take 1 tablet (10 mg total) by mouth at bedtime. (Patient not taking: Reported on 02/25/2019)   . dapagliflozin propanediol (FARXIGA) 10 MG TABS tablet Take 10 mg by mouth daily. (Patient not taking: Reported on 02/25/2019) 02/25/2019: Missed yesterday and today  . Vitamin D, Ergocalciferol, (DRISDOL) 1.25 MG (50000 UT) CAPS capsule Take 1 capsule (50,000 Units total) by mouth every 7 (seven) days. (Patient not taking: Reported on 02/25/2019) 02/25/2019: Never filled it  . [DISCONTINUED] ipratropium (ATROVENT) 0.06 % nasal spray Place 2 sprays into both nostrils 4 (four) times daily. (Patient not taking: Reported on 01/03/2019) 08/20/2018: Uses prn   No facility-administered encounter medications on file as of 02/25/2019.    Allergies  Allergen Reactions  . Dilaudid [Hydromorphone Hcl] Hives  . Morphine And Related Hives  . Peanut-Containing Drug Products Hives  . Strawberry Extract Swelling    Swelling is of the eye.   ROS: no URI symptoms, cough, shortness of breath, chest pain. +dizziness per HPI.  No nausea, vomiting, diarrhea, bleeding, bruising, rash. Urinary symptoms, back pain and fever resolved.  Intentional weight loss (eating better, more active). Last wt at home was 290.    PHYSICAL EXAM:  BP (!) 160/90   Pulse 68   Temp (!) 96.9 F (36.1 C) (Other (Comment))   Ht 5\' 4"  (1.626 m)   Wt 285 lb 9.6 oz (129.5 kg)   LMP 02/14/2019 (Approximate)   BMI 49.02 kg/m   Wt Readings from Last 3 Encounters:  02/25/19 285 lb 9.6 oz (129.5 kg)  01/07/19 295 lb 9.6 oz (134.1 kg)  08/20/18 296 lb 12.8 oz (134.6 kg)   Pleasant,  obese female, in good spirits, in no distress HEENT: conjunctiva =and sclera are clear, EOMI. Wearing mask Neck: no lymphadenopathy Heart: regular rate and rhythm, no murmur Lungs: clear bilaterally Back: no CVA tenderness Abdomen: obese, soft, nontender Extremities: no edema Skin: normal turgor, no rash Psych: normal mood, affect, hygiene and grooming Neuro: alert, oriented, normal strength, gait.  Urine dip: SG 1.025, trace protein, otherwise normal.  No glucose   ASSESSMENT/PLAN:  Pyelonephritis - responding to antibiotics, complete course. To seek care earlier, as soon as symptoms develop - Plan: POCT Urinalysis DIP (Proadvantage Device)  Essential hypertension - high today; stopped amlodipine in error.  Restart amlodipine and continue losartan and spironolactone. Monitor BP elsewhere  Diabetes  mellitus type 2 with complications (HCC) - sugars have improved.  UTI may be complication of Farxiga.  Cont Wilder Glade, seek care immediately if/when urinary sx develop  Dizziness - lightheaded--not orthostatic. Urine is concentrated--push fluids. Anemia may contribute. Restart iron  Iron deficiency anemia, unspecified iron deficiency anemia type - drop in hemoglobin noted, likely related to her heavy menstrual cycles. Restart iron, take BID with food over next month  F/u as scheduled in November.  Wilder Glade may have contributed to the bladder infection. I think the [place] Glade is working great for your sugar and weight loss. We will continue it, but be sure to seek more immediate care at onset of any urinary symptoms in the future.  Please drink LOTS more water (your urine is still very concentrated today, and may contribute to your dizziness). You also have some anemia related to your heavy cycles, which may contribute.  Start taking iron twice daily with food (along with stool softener) to help get your iron back up.  Please fill the vitamin D prescription and take it weekly for 12 weeks.  Once you finish it, start taking vitamin D3 2000 IU every day, long-term, in addition to your multivitamin.  Please restart your amlodipine.  The losartan was an ADDITION to help protect your kidneys, and because your blood pressure was still high. Continue the spironolactone as well. Monitor your blood pressure regularly, and feel free to send [place] a list in a couple of weeks.  Let us know if you have concerns.

## 2019-02-25 ENCOUNTER — Other Ambulatory Visit: Payer: Self-pay

## 2019-02-25 ENCOUNTER — Ambulatory Visit: Payer: 59 | Admitting: Family Medicine

## 2019-02-25 ENCOUNTER — Encounter: Payer: Self-pay | Admitting: Family Medicine

## 2019-02-25 VITALS — BP 140/108 | HR 76 | Temp 96.9°F | Ht 64.0 in | Wt 285.6 lb

## 2019-02-25 DIAGNOSIS — D509 Iron deficiency anemia, unspecified: Secondary | ICD-10-CM | POA: Diagnosis not present

## 2019-02-25 DIAGNOSIS — I10 Essential (primary) hypertension: Secondary | ICD-10-CM | POA: Diagnosis not present

## 2019-02-25 DIAGNOSIS — E118 Type 2 diabetes mellitus with unspecified complications: Secondary | ICD-10-CM | POA: Diagnosis not present

## 2019-02-25 DIAGNOSIS — R42 Dizziness and giddiness: Secondary | ICD-10-CM | POA: Diagnosis not present

## 2019-02-25 DIAGNOSIS — N12 Tubulo-interstitial nephritis, not specified as acute or chronic: Secondary | ICD-10-CM

## 2019-02-25 LAB — POCT URINALYSIS DIP (PROADVANTAGE DEVICE)
Bilirubin, UA: NEGATIVE
Blood, UA: NEGATIVE
Glucose, UA: NEGATIVE mg/dL
Ketones, POC UA: NEGATIVE mg/dL
Leukocytes, UA: NEGATIVE
Nitrite, UA: NEGATIVE
Protein Ur, POC: 30 mg/dL — AB
Specific Gravity, Urine: 1.025
Urobilinogen, Ur: NEGATIVE
pH, UA: 6 (ref 5.0–8.0)

## 2019-02-25 NOTE — Patient Instructions (Signed)
Wilder Glade may have contributed to the bladder infection. I think the [place] Glade is working great for your sugar and weight loss. We will continue it, but be sure to seek more immediate care at onset of any urinary symptoms in the future.  Please drink LOTS more water (your urine is still very concentrated today, and may contribute to your dizziness). You also have some anemia related to your heavy cycles, which may contribute.  Start taking iron twice daily with food (along with stool softener) to help get your iron back up.  Please fill the vitamin D prescription and take it weekly for 12 weeks. Once you finish it, start taking vitamin D3 2000 IU every day, long-term, in addition to your multivitamin.  Please restart your amlodipine.  The losartan was an ADDITION to help protect your kidneys, and because your blood pressure was still high. Continue the spironolactone as well. Monitor your blood pressure regularly, and feel free to send [place] a list in a couple of weeks.  Let us know if you have concerns.

## 2019-03-01 MED FILL — FARXIGA 10 MG TABLET: 10 | 30 days supply | Qty: 30 | Fill #0

## 2019-03-23 ENCOUNTER — Other Ambulatory Visit: Payer: Self-pay | Admitting: Family Medicine

## 2019-03-23 DIAGNOSIS — R809 Proteinuria, unspecified: Secondary | ICD-10-CM

## 2019-03-23 DIAGNOSIS — I10 Essential (primary) hypertension: Secondary | ICD-10-CM

## 2019-03-23 DIAGNOSIS — E119 Type 2 diabetes mellitus without complications: Secondary | ICD-10-CM

## 2019-03-23 MED FILL — LOSARTAN POTASSIUM 25 MG TA: 25 | 90 days supply | Qty: 90 | Fill #0

## 2019-03-23 MED FILL — metFORMIN HCL 1000 MG TABS: 1000 | 90 days supply | Qty: 180 | Fill #0

## 2019-03-25 MED FILL — FARXIGA 10 MG TABLET: 10 | 30 days supply | Qty: 30 | Fill #1

## 2019-03-25 MED FILL — FREESTYLE LITE TEST STRIP: 50 days supply | Qty: 100 | Fill #2

## 2019-04-07 MED FILL — LOSARTAN POTASSIUM 25 MG TA: 25 | 90 days supply | Qty: 90 | Fill #0

## 2019-04-07 MED FILL — metFORMIN HCL 1000 MG TABS: 1000 | 90 days supply | Qty: 180 | Fill #0

## 2019-04-07 MED FILL — FREESTYLE LITE TEST STRIP: 50 days supply | Qty: 100 | Fill #2

## 2019-04-08 MED FILL — FARXIGA 10 MG TABLET: 10 | 30 days supply | Qty: 30 | Fill #1

## 2019-04-14 ENCOUNTER — Encounter: Payer: 59 | Admitting: Family Medicine

## 2019-04-14 NOTE — Progress Notes (Signed)
Chief Complaint  Patient presents with  . Diabetes    fasting med check. Has a knot on the right side of neck and it is a bit uncomfortable-been there about 2 days. Not taking iron.   Noticed a "knot" on the right front neck about 2 days ago.  It is a little sore. She had a sore throat last week.  Denies PND, cold symptoms, fever or chills.  Hypertension: She reports compliance with taking losartan and spironolactone.  She hasn't been taking amlodipine--causes headache, so doesn't help her blood pressure (BP high from pain of headache when taking amlodipine also, per pt).  Tried it again after last visit, but had headaches recur, so stopped again (and didn't tell us).   BP's at home are reportedly 130-140's/70-80's, up to 90 without the amlodipine. She denies side effects.  Denies headaches, dizziness, chest pain, edema. Losartan 56m was last med to be added (due to microalbuminuria, once she started using condoms to prevent pregnancy).  The kids broke her CPAP--trying to figure out who to get it replaced from. She plans to call insurance today.  She notices a difference since she hasn't been using it for the last couple of weeks.  She is a lot more tired since not using.  Headaches are mild.  DM--she reports compliance with Januvia (added in August), Farxiga and Metformin. She didn't tolerate higher doses of metformin (due to diarrhea). She previously was able to tolerate 500/1000 (not 10057mBID).  She is currently only taking 100030mt dinner. Not taking the morning 500m83mDiet had changed, lost weight, and sugars had dropped. Sugars are running 110-130 in the mornings.  After lunch 80's-115.  Before dinner 120's-130's.  [facility] in [facility] yet). She checks her feet regularly and denies numbness, tingling, sores, concerns  Lab Results  Component Value Date   HGBA1C 8.3 (H) 01/07/2019   H/o anemia, likely  related to heavy menses.  Told to take iron BID after last check. She took it for a couple of weeks, stopped due to constipation. Periods are still monthly, and continue to be heavy.  Lab Results  Component Value Date   WBC 7.8 02/21/2019   HGB 11.0 (L) 02/21/2019   HCT 33.7 (L) 02/21/2019   MCV 78.7 (L) 02/21/2019   PLT 308 02/21/2019    H/oVitamin D deficiency: Last level was low at 18.1 in August; at that time she was only taking MVI.  It had also been low the prior check, also when only taking MVI.  She was treated with another 12 weeks of rx D, and was advised to start taking 2000 IU daily. She admits that she completely forgot that this prescription was sent (even though reminded at her last visit), JUST picked it up and hasn't started it yet.  Hyperlipidemia:  She is diabetic, and was finally started on statin at last visit, when she agreed to use contraception (and ARB also started).  She hasn't had recheck since starting atorvastatin. She is tolerating medication without side effects. She is fasting today for labs.  She reports she is using condoms regularly for contraception.  Lab Results  Component Value Date   CHOL 191 08/20/2018   HDL 57 08/20/2018   LDLCALC 111 (H) 08/20/2018   TRIG 116 08/20/2018   CHOLHDL 3.4 08/20/2018   PMH, PSH, SH reviewed  Outpatient Encounter Medications as of 04/15/2019  Medication Sig  Note  . albuterol (PROVENTIL) (2.5 MG/3ML) 0.083% nebulizer solution Take 3 mLs (2.5 mg total) by nebulization every 6 (six) hours as needed for wheezing or shortness of breath. 04/15/2019: Uses occasionally (wearing mask bothers her)  . atorvastatin (LIPITOR) 20 MG tablet Take 1 tablet (20 mg total) by mouth daily.   . dapagliflozin propanediol (FARXIGA) 10 MG TABS tablet Take 10 mg by mouth daily.   Marland Kitchen glucose blood (FREESTYLE LITE) test strip USE AS DIRECTED 2 (TWO) TIMES DAILY   . losartan (COZAAR) 25 MG tablet TAKE 1 TABLET BY MOUTH ONCE A DAY   .  metFORMIN (GLUCOPHAGE) 1000 MG tablet TAKE 1 TABLET (1,000 MG TOTAL) BY MOUTH 2 TIMES DAILY WITH A MEAL. 04/15/2019: Only taking 1060m once daily (with dinner)  . Multiple Vitamins-Minerals (WOMENS DAILY FORMULA PO) Take 1 tablet by mouth daily.   . sitaGLIPtin (JANUVIA) 50 MG tablet Take 1 tablet (50 mg total) by mouth daily.   .Marland Kitchenspironolactone (ALDACTONE) 25 MG tablet Take 1 tablet (25 mg total) by mouth daily.   .Marland KitchenamLODipine (NORVASC) 10 MG tablet Take 1 tablet (10 mg total) by mouth at bedtime. (Patient not taking: Reported on 02/25/2019) 04/15/2019: Causes headaches, can't tolerate  . ibuprofen (ADVIL) 800 MG tablet Take 1 tablet (800 mg total) by mouth 3 (three) times daily as needed. (Patient not taking: Reported on 04/15/2019)   . Vitamin D, Ergocalciferol, (DRISDOL) 1.25 MG (50000 UT) CAPS capsule Take 1 capsule (50,000 Units total) by mouth every 7 (seven) days. (Patient not taking: Reported on 04/15/2019) 04/15/2019: Just got it filled, hasn't started taking it yet  . [DISCONTINUED] ipratropium (ATROVENT) 0.06 % nasal spray Place 2 sprays into both nostrils 4 (four) times daily. (Patient not taking: Reported on 01/03/2019) 08/20/2018: Uses prn   No facility-administered encounter medications on file as of 04/15/2019.    ROS: no fever, chills. Slight headache today.  No dizziness, chest pain, palpitations, nausea, vomiting, diarrhea.  Bowels are normal (since stopping iron).  No bleeding, bruising, rashes. +pain right neck; sore throat last week resolved. Moods are good overall, but some stress/irritability. (related to kids)   PHYSICAL EXAM:  BP (!) 170/110   Pulse 84   Temp 98.4 F (36.9 C) (Other (Comment))   Ht _0  (1.626 m)   Wt 285 lb 12.8 oz (129.6 kg)   LMP 04/10/2019   BMI 49.06 kg/m   Wt Readings from Last 3 Encounters:  04/15/19 285 lb 12.8 oz (129.6 kg)  02/25/19 285 lb 9.6 oz (129.5 kg)  01/07/19 295 lb 9.6 oz (134.1 kg)    BP repeated with thigh cuff was still  elevated, 172/110 Well-appearing, pleasant, obese female, in no distress HEENT: conjunctiva and sclera are clear, EOMI.  Wearing mask due to COVID-19. Neck: spine is nontender. No lymphadenopathy, thyromegaly or carotid bruit.  She is tender in focal area along the inferior aspect of the right mandible.  (reports having some dental issues with those teeth).  No "knot" or swelling noted, just tenderness along the bone.  No erythema or soft tissue swelling. Heart: regular rate and rhythm Lungs: clear bilaterally Back: no spinal or CVA tenderness Abdomen: obese, soft, nontender Extremities: no edema Neuro: she is alert, oriented, EOMI. Normal strength, gait, DTR's. Psych: normal mood, affect, hygiene and grooming Skin: normal turgor, no rash  Lab Results  Component Value Date   HGBA1C 6.3 (A) 04/15/2019     ASSESSMENT/PLAN:  Diabetes mellitus type 2 with complications ([place] - improved control.  Change metformin to 575m BID (1/2 tab) rather than 10020mw/dinner. Cont Januvia and Farxiga, diet, wt loss - Plan: HgB A1c, Lipid panel, Comprehensive metabolic panel  Essential hypertension - Very high today, noncompliant with amlodipine, didn't tolerate. Plan to increase losartan to 50 (and poss further if needed)-await K+ to ensure safe to increase - Plan: Comprehensive metabolic panel  Iron deficiency anemia, unspecified iron deficiency anemia type - if Hg remains low, discussed taking miralax once daily, along with iron - Plan: CBC with Differential, Ferritin  Vitamin D deficiency - noncompliant; just filled 12 week course of Rx.  To take this, f/b 2000 IU daily of OTC D3  Hypertension associated with diabetes ([place] Medication monitoring encounter - Plan: Lipid panel, CBC with Differential, Ferritin, Comprehensive metabolic panel  Jaw pain - focal area of discomfort on mandible, suspect related to dental issues.  no LAD.  To f/u with dentist as planned  OSA (obstructive sleep apnea) -  CPAP broke 2 weeks ago, needs to get replaced. This may contribute to HA and elevated BPs. Contact company to replace/fix   Increase losartan to 5017mf K+ is normal. May need further titration up to 100m78mut BP's at home aren't as high as here today.  She is to monitor regularly at home, and contact us wK[place]ah BP's in the next few weeks.  NEEDS LOSARTAN 50mg65mif increasing  A1c c-met, lipids, CBC, ferritin  Just picked up a lot of refills this week, isn't sure what she needs Only able to get 30d instead of 90d for some of them  [facility]will ensure received/scanned.   Change your metformin to 1/2 tablet twice daily (500mg 47m), since it isn't extended release/long-acting.  This should help keep the sugars more even during the day.  If you are told to restart iron supplement, be sure to take stool softener (colace) daily, and also start 1 capful of Miralax daily (and adjust down the dose or frequency if stools are too frequent or too loose).  Once you finish taking the 12 weeks of prescription D (once weekly), start takin 2000 IU of D3 every day (in addition to your multivitamin).  Go ahead and buy the D3 now, so that you have it at home and remember to start.  Be sure to get your CPAP restarted--let us kno[place]oreaif you need an order or anything done to help you get this.  Once I see your lab results from today, if your potassium is okay, we will have you increase the losartan to 50mg (40mI'll send in a new prescription for the 50mg ta37m).  Within a month of taking the higher dose, I'll need to know what your blood pressures are running (and hopefully you will be back on CPAP and they will be better).  Goal is 130/80 or less.

## 2019-04-15 ENCOUNTER — Ambulatory Visit (INDEPENDENT_AMBULATORY_CARE_PROVIDER_SITE_OTHER): Payer: 59 | Admitting: Family Medicine

## 2019-04-15 ENCOUNTER — Other Ambulatory Visit: Payer: Self-pay

## 2019-04-15 ENCOUNTER — Encounter: Payer: Self-pay | Admitting: Family Medicine

## 2019-04-15 VITALS — BP 172/110 | HR 84 | Temp 98.4°F | Ht 64.0 in | Wt 285.8 lb

## 2019-04-15 DIAGNOSIS — I10 Essential (primary) hypertension: Secondary | ICD-10-CM | POA: Diagnosis not present

## 2019-04-15 DIAGNOSIS — I152 Hypertension secondary to endocrine disorders: Secondary | ICD-10-CM

## 2019-04-15 DIAGNOSIS — R809 Proteinuria, unspecified: Secondary | ICD-10-CM

## 2019-04-15 DIAGNOSIS — E559 Vitamin D deficiency, unspecified: Secondary | ICD-10-CM

## 2019-04-15 DIAGNOSIS — R6884 Jaw pain: Secondary | ICD-10-CM

## 2019-04-15 DIAGNOSIS — E119 Type 2 diabetes mellitus without complications: Secondary | ICD-10-CM

## 2019-04-15 DIAGNOSIS — E1159 Type 2 diabetes mellitus with other circulatory complications: Secondary | ICD-10-CM | POA: Diagnosis not present

## 2019-04-15 DIAGNOSIS — Z5181 Encounter for therapeutic drug level monitoring: Secondary | ICD-10-CM

## 2019-04-15 DIAGNOSIS — D509 Iron deficiency anemia, unspecified: Secondary | ICD-10-CM

## 2019-04-15 DIAGNOSIS — G4733 Obstructive sleep apnea (adult) (pediatric): Secondary | ICD-10-CM | POA: Diagnosis not present

## 2019-04-15 DIAGNOSIS — E118 Type 2 diabetes mellitus with unspecified complications: Secondary | ICD-10-CM | POA: Diagnosis not present

## 2019-04-15 LAB — POCT GLYCOSYLATED HEMOGLOBIN (HGB A1C): Hemoglobin A1C: 6.3 % — AB (ref 4.0–5.6)

## 2019-04-15 NOTE — Patient Instructions (Signed)
Change your metformin to 1/2 tablet twice daily (500mg  dose), since it isn't extended release/long-acting.  This should help keep the sugars more even during the day.  If you are told to restart iron supplement, be sure to take stool softener (colace) daily, and also start 1 capful of Miralax daily (and adjust down the dose or frequency if stools are too frequent or too loose).  Once you finish taking the 12 weeks of prescription D (once weekly), start takin 2000 IU of D3 every day (in addition to your multivitamin).  Go ahead and buy the D3 now, so that you have it at home and remember to start.  Be sure to get your CPAP restarted--let us know if you need an order or anything done to help you get this.  Once I see your lab results from today, if your potassium is okay, we will have you increase the losartan to 50mg  (and I'll send in a new prescription for the 50mg  tablet).  Within a month of taking the higher dose, I'll need to know what your blood pressures are running (and hopefully you will be back on CPAP and they will be better).  Goal is 130/80 or less.

## 2019-04-16 ENCOUNTER — Encounter: Payer: Self-pay | Admitting: Family Medicine

## 2019-04-16 LAB — LIPID PANEL
Chol/HDL Ratio: 3.5 ratio (ref 0.0–4.4)
Cholesterol, Total: 167 mg/dL (ref 100–199)
HDL: 48 mg/dL (ref >39)
LDL Chol Calc (NIH): 102 mg/dL — ABNORMAL HIGH (ref 0–99)
Triglycerides: 91 mg/dL (ref 0–149)
VLDL Cholesterol Cal: 17 mg/dL (ref 5–40)

## 2019-04-16 LAB — COMPREHENSIVE METABOLIC PANEL
ALT: 12 IU/L (ref 0–32)
AST: 14 IU/L (ref 0–40)
Albumin/Globulin Ratio: 1.7 (ref 1.2–2.2)
Albumin: 4.1 g/dL (ref 3.8–4.8)
Alkaline Phosphatase: 57 IU/L (ref 39–117)
BUN/Creatinine Ratio: 16 (ref 9–23)
BUN: 9 mg/dL (ref 6–20)
Bilirubin Total: 0.3 mg/dL (ref 0.0–1.2)
CO2: 22 mmol/L (ref 20–29)
Calcium: 9.1 mg/dL (ref 8.7–10.2)
Chloride: 105 mmol/L (ref 96–106)
Creatinine, Ser: 0.58 mg/dL (ref 0.57–1.00)
GFR calc Af Amer: 138 mL/min/{1.73_m2} (ref >59)
GFR calc non Af Amer: 120 mL/min/{1.73_m2} (ref >59)
Globulin, Total: 2.4 g/dL (ref 1.5–4.5)
Glucose: 89 mg/dL (ref 65–99)
Potassium: 3.9 mmol/L (ref 3.5–5.2)
Sodium: 141 mmol/L (ref 134–144)
Total Protein: 6.5 g/dL (ref 6.0–8.5)

## 2019-04-16 LAB — CBC WITH DIFFERENTIAL/PLATELET
Basophils Absolute: 0 10*3/uL (ref 0.0–0.2)
Basos: 1 %
EOS (ABSOLUTE): 0.1 10*3/uL (ref 0.0–0.4)
Eos: 2 %
Hematocrit: 35.3 % (ref 34.0–46.6)
Hemoglobin: 11.2 g/dL (ref 11.1–15.9)
Immature Grans (Abs): 0 10*3/uL (ref 0.0–0.1)
Immature Granulocytes: 0 %
Lymphocytes Absolute: 2.4 10*3/uL (ref 0.7–3.1)
Lymphs: 38 %
MCH: 24.3 pg — ABNORMAL LOW (ref 26.6–33.0)
MCHC: 31.7 g/dL (ref 31.5–35.7)
MCV: 77 fL — ABNORMAL LOW (ref 79–97)
Monocytes Absolute: 0.4 10*3/uL (ref 0.1–0.9)
Monocytes: 6 %
Neutrophils Absolute: 3.4 10*3/uL (ref 1.4–7.0)
Neutrophils: 53 %
Platelets: 289 10*3/uL (ref 150–450)
RBC: 4.6 x10E6/uL (ref 3.77–5.28)
RDW: 15.8 % — ABNORMAL HIGH (ref 11.7–15.4)
WBC: 6.3 10*3/uL (ref 3.4–10.8)

## 2019-04-16 LAB — FERRITIN: Ferritin: 16 ng/mL (ref 15–150)

## 2019-04-16 MED ORDER — LOSARTAN POTASSIUM 50 MG PO TABS: 50.0000 mg | ORAL_TABLET | Freq: Every day | ORAL | 2 refills | Status: DC

## 2019-04-16 MED ORDER — METFORMIN HCL 1000 MG PO TABS: 500.0000 mg | ORAL_TABLET | Freq: Two times a day (BID) | ORAL | 0 refills | Status: DC

## 2019-04-16 NOTE — Addendum Note (Signed)
Addended by: Rita Ohara on: 04/16/2019 09:05 AM   Modules accepted: Orders

## 2019-04-21 ENCOUNTER — Other Ambulatory Visit: Payer: Self-pay | Admitting: *Deleted

## 2019-04-21 DIAGNOSIS — E118 Type 2 diabetes mellitus with unspecified complications: Secondary | ICD-10-CM

## 2019-04-21 DIAGNOSIS — Z5181 Encounter for therapeutic drug level monitoring: Secondary | ICD-10-CM

## 2019-04-24 ENCOUNTER — Encounter: Payer: Self-pay | Admitting: Family Medicine

## 2019-05-05 ENCOUNTER — Other Ambulatory Visit: Payer: Self-pay | Admitting: *Deleted

## 2019-05-05 MED ORDER — LOSARTAN POTASSIUM 100 MG PO TABS: 100.0000 mg | ORAL_TABLET | Freq: Every day | ORAL | 0 refills | Status: DC

## 2019-05-05 MED FILL — LOSARTAN POTASSIUM 100 MG T: 100 | 30 days supply | Qty: 30 | Fill #0

## 2019-05-13 MED FILL — FARXIGA 10 MG TABLET: 10 | 30 days supply | Qty: 30 | Fill #2

## 2019-05-16 NOTE — Progress Notes (Deleted)
  Patient presents to follow up on her hypertension.  Losartan dose was increased on 12/9 to 100mg . She is due to check chem panel to ensure that potassium is okay (since she also takes spironolactone).  She had contacted [place] reporting that BP when off amlodipine and on the 50mg  of losartan, remained elevated at 140-170/92-109. She had stopped the amlodipine due to headaches.  She was unable to tolerate amlodipine, even at lower dose.  It gave her a headache, made her feel "zonked out", though she did report BP's were much better, she couldn't tolerate staying on it.  BP's have been running  Continued headaches??  Patient had reported  (11/25) that while she had filled the atorvastatin, she had never started taking it.  She has started on it now, and is tolerating without side effects.  She is scheduled for another visit in 06/2019 to recheck. Lab Results  Component Value Date   CHOL 167 04/15/2019   HDL 48 04/15/2019   LDLCALC 102 (H) 04/15/2019   TRIG 91 04/15/2019   CHOLHDL 3.5 04/15/2019   Diabetes:  At last visit she reported taking metformin 1000mg  with dinner.  She was advised to change this to 500mg  BID (1/2 tab). She continues to take [place]. Sugars have been  OSA--compliant with CPAP   PMH, PSH, SH reviewed   ROS: no fever, chills, URI symptoms.  No chest pain, palpitations, shortness of breath or edema. No GI complaints. No muscle cramps/spasms. Headaches?   PHYSICAL EXAM:  Wt Readings from Last 3 Encounters:  04/15/19 285 lb 12.8 oz (129.6 kg)  02/25/19 285 lb 9.6 oz (129.5 kg)  01/07/19 295 lb 9.6 oz (134.1 kg)   Well-appearing, pleasant, obese female, in no distress HEENT: conjunctiva and sclera are clear, EOMI. Wearing mask due to COVID-19. Neck: No lymphadenopathy, thyromegaly or mass Heart: regular rate and rhythm Lungs: clear bilaterally Back: no spinal or CVA tenderness Abdomen: obese, soft, nontender Extremities: no edema Neuro: she is alert,  oriented, normal gait Psych: normal mood, affect, hygiene and grooming Skin: normal turgor, no rash   ASSESSMENT/PLAN:  Ensure CPAP working, and tolerating statin  F/u as scheduled with fasting labs prior to 06/2019 visit

## 2019-05-17 ENCOUNTER — Ambulatory Visit: Payer: Self-pay | Admitting: Family Medicine

## 2019-05-17 MED FILL — LOSARTAN POTASSIUM 100 MG T: 100 | 30 days supply | Qty: 30 | Fill #0

## 2019-05-25 NOTE — Progress Notes (Signed)
Chief Complaint  Patient presents with  . Hypertension    nonfasting bp follow up.    Patient presents to follow up on her hypertension.  Losartan dose was increased on 12/9 to 176m. She is due to check chem panel to ensure that potassium is okay (since she also takes spironolactone). She had [place] that BP remained elevated at 140-170/92-109 when off amlodipine and on the 541mof losartan. She had stopped the amlodipine due to headaches.  She was unable to tolerate amlodipine, even at lower dose.  It gave her a headache, made her feel "zonked out", though she did report BP's were much better, she couldn't tolerate staying on it.  Denies any side effects from the 10016mose of losartan.  Denies headaches. BP's have been running up to 130-140/80's-90.  She doesn't know why it is so high today. Didn't check it earlier today. She has taken her medications today. Last checked BP a couple of days ago, recalls it was 148/95.  Previously tolerated procardia (taken with pregnancy)--did recall her pulse got low on it.  Patient had reported  (11/25) that while she had filled the atorvastatin, she had never started taking it.  She has started on it now, and is tolerating without side effects.  She is scheduled for another visit in 06/2019 to recheck. Lab Results  Component Value Date   CHOL 167 04/15/2019   HDL 48 04/15/2019   LDLCALC 102 (H) 04/15/2019   TRIG 91 04/15/2019   CHOLHDL 3.5 04/15/2019    Diabetes:  At last visit she reported taking metformin 1000m16mth dinner. She has since changed this to 500mg40m (1/2 tab), and continues to take [place]uv[place] have been up to 190's (after eating some candy).  Fasting sugars are running 117-125, up to 134. 2 hours after meals typically runs 140-150. Only once saw it in the 60's. She tends to get hot flashes and feel bad when it is in the 80's (usually occurs at lunchtime).  Lab Results  Component Value Date   HGBA1C 6.3 (A) 04/15/2019    OSA--compliant with CPAP, and she feels "a whole lot better".   PMH, PSH, SH reviewed  Outpatient Encounter Medications as of 05/26/2019  Medication Sig Note  . albuterol (PROVENTIL) (2.5 MG/3ML) 0.083% nebulizer solution Take 3 mLs (2.5 mg total) by nebulization every 6 (six) hours as needed for wheezing or shortness of breath. 04/15/2019: Uses occasionally (wearing mask bothers her)  . APPLE CIDER VINEGAR PO Take 2 each by mouth 2 (two) times daily. 05/26/2019: goli  . atorvastatin (LIPITOR) 20 MG tablet Take 1 tablet (20 mg total) by mouth daily.   . dapagliflozin propanediol (FARXIGA) 10 MG TABS tablet Take 10 mg by mouth daily.   . gluMarland Kitchenose blood (FREESTYLE LITE) test strip USE AS DIRECTED 2 (TWO) TIMES DAILY   . losartan (COZAAR) 100 MG tablet Take 1 tablet (100 mg total) by mouth daily.   . metFORMIN (GLUCOPHAGE) 1000 MG tablet Take 0.5 tablets (500 mg total) by mouth 2 (two) times daily with a meal.   . Multiple Vitamins-Minerals (WOMENS DAILY FORMULA PO) Take 1 tablet by mouth daily.   . sitaGLIPtin (JANUVIA) 50 MG tablet Take 1 tablet (50 mg total) by mouth daily.   . spiMarland Kitchenonolactone (ALDACTONE) 25 MG tablet Take 1 tablet (25 mg total) by mouth daily.   . Vitamin D, Ergocalciferol, (DRISDOL) 1.25 MG (50000 UT) CAPS capsule Take 1 capsule (50,000 Units total) by mouth every  7 (seven) days.   Marland Kitchen ibuprofen (ADVIL) 800 MG tablet Take 1 tablet (800 mg total) by mouth 3 (three) times daily as needed. (Patient not taking: Reported on 04/15/2019)   . [DISCONTINUED] ipratropium (ATROVENT) 0.06 % nasal spray Place 2 sprays into both nostrils 4 (four) times daily. (Patient not taking: Reported on 01/03/2019) 08/20/2018: Uses prn   No facility-administered encounter medications on file as of 05/26/2019.   Allergies  Allergen Reactions  . Dilaudid [Hydromorphone Hcl] Hives  . Morphine And Related Hives  . Peanut-Containing Drug Products Hives  . Strawberry  Extract Swelling    Swelling is of the eye.    ROS: no fever, chills, URI symptoms.  No chest pain, palpitations, shortness of breath or edema. No GI complaints. No muscle cramps/spasms. Headaches resolved.  Denies any allergies.   PHYSICAL EXAM:  BP (!) 162/118   Pulse 72   Temp (!) 96 F (35.6 C) (Other (Comment))   Ht 5' 4" (1.626 m)   Wt 289 lb 3.2 oz (131.2 kg)   LMP 05/04/2019 (Exact Date)   BMI 49.64 kg/m   150/96 on repeat by MD  Wt Readings from Last 3 Encounters:  05/26/19 289 lb 3.2 oz (131.2 kg)  04/15/19 285 lb 12.8 oz (129.6 kg)  02/25/19 285 lb 9.6 oz (129.5 kg)    Well-appearing, pleasant, obese female, in no distress HEENT: conjunctiva and sclera are clear, EOMI. Wearing mask due to COVID-19. Neck: No lymphadenopathy, thyromegaly or mass, no bruit Heart: regular rate and rhythm Lungs: clear bilaterally Back: no spinal or CVA tenderness Abdomen: obese, soft, nontender Extremities: no edema Neuro: she is alert, oriented, normal gait Psych: normal mood, affect, hygiene and grooming Skin: normal turgor, no rash   ASSESSMENT/PLAN:  Essential hypertension - borderline at home, elevated today. Add low dose procardia (high dose if losartan dose needs to be cut back). Low Na diet, wt loss rec - Plan: Basic Metabolic Panel (BMET)  Medication monitoring encounter - Plan: Basic Metabolic Panel (BMET)  Hypertension associated with diabetes ([place])  Diabetes mellitus type 2 with complications ([place]) - controlled  OSA (obstructive sleep apnea) - compliant with CPAP  Class 3 severe obesity due to excess calories with serious comorbidity and body mass index (BMI) of 45.0 to 49.9 in adult ([place]) - wt loss encouraged  Vitamin D deficiency - reminded to start 2000 IU daily upon completing Rx D   Procardia XL 51m to [facility] outpt if not changing losartan May need higher if losartan dose decreased. Risks/SE reviewed.  b-met   F/u as scheduled with fasting  labs prior to 06/2019 visit

## 2019-05-26 ENCOUNTER — Encounter: Payer: Self-pay | Admitting: Family Medicine

## 2019-05-26 ENCOUNTER — Ambulatory Visit: Payer: 59 | Admitting: Family Medicine

## 2019-05-26 ENCOUNTER — Other Ambulatory Visit: Payer: Self-pay

## 2019-05-26 VITALS — BP 150/96 | HR 72 | Temp 96.0°F | Ht 64.0 in | Wt 289.2 lb

## 2019-05-26 DIAGNOSIS — E1159 Type 2 diabetes mellitus with other circulatory complications: Secondary | ICD-10-CM | POA: Diagnosis not present

## 2019-05-26 DIAGNOSIS — E559 Vitamin D deficiency, unspecified: Secondary | ICD-10-CM | POA: Diagnosis not present

## 2019-05-26 DIAGNOSIS — Z5181 Encounter for therapeutic drug level monitoring: Secondary | ICD-10-CM

## 2019-05-26 DIAGNOSIS — G4733 Obstructive sleep apnea (adult) (pediatric): Secondary | ICD-10-CM | POA: Diagnosis not present

## 2019-05-26 DIAGNOSIS — I152 Hypertension secondary to endocrine disorders: Secondary | ICD-10-CM

## 2019-05-26 DIAGNOSIS — I10 Essential (primary) hypertension: Secondary | ICD-10-CM

## 2019-05-26 DIAGNOSIS — E118 Type 2 diabetes mellitus with unspecified complications: Secondary | ICD-10-CM

## 2019-05-26 DIAGNOSIS — Z6841 Body Mass Index (BMI) 40.0 and over, adult: Secondary | ICD-10-CM

## 2019-05-26 NOTE — Patient Instructions (Addendum)
Be sure to start the Vitamin D3 at 2000 IU once you finish the prescription D.  I will let you know about the procardia prescription once I see your lab results tomorrow. (this would be in addition to the other blood pressure medications)

## 2019-05-27 LAB — BASIC METABOLIC PANEL
BUN/Creatinine Ratio: 14 (ref 9–23)
BUN: 11 mg/dL (ref 6–20)
CO2: 19 mmol/L — ABNORMAL LOW (ref 20–29)
Calcium: 9.8 mg/dL (ref 8.7–10.2)
Chloride: 106 mmol/L (ref 96–106)
Creatinine, Ser: 0.77 mg/dL (ref 0.57–1.00)
GFR calc Af Amer: 115 mL/min/{1.73_m2} (ref >59)
GFR calc non Af Amer: 100 mL/min/{1.73_m2} (ref >59)
Glucose: 92 mg/dL (ref 65–99)
Potassium: 4 mmol/L (ref 3.5–5.2)
Sodium: 138 mmol/L (ref 134–144)

## 2019-05-27 MED ORDER — NIFEDIPINE ER OSMOTIC RELEASE 30 MG PO TB24: 30.0000 mg | ORAL_TABLET | Freq: Every day | ORAL | 1 refills | Status: DC

## 2019-05-27 MED FILL — NIFEdipine ER OSMOTIC RELEA: 30 | 30 days supply | Qty: 30 | Fill #0

## 2019-05-27 NOTE — Addendum Note (Signed)
Addended by: Rita Ohara on: 05/27/2019 08:08 AM   Modules accepted: Orders

## 2019-05-28 ENCOUNTER — Encounter (HOSPITAL_COMMUNITY): Payer: Self-pay | Admitting: Emergency Medicine

## 2019-05-28 ENCOUNTER — Emergency Department (HOSPITAL_COMMUNITY)
Admission: EM | Admit: 2019-05-28 | Discharge: 2019-05-28 | Disposition: A | Discharge status: Home / Self Care | Payer: 59 | Attending: Emergency Medicine | Admitting: Emergency Medicine

## 2019-05-28 ENCOUNTER — Other Ambulatory Visit: Payer: Self-pay

## 2019-05-28 DIAGNOSIS — E119 Type 2 diabetes mellitus without complications: Secondary | ICD-10-CM | POA: Insufficient documentation

## 2019-05-28 DIAGNOSIS — I10 Essential (primary) hypertension: Secondary | ICD-10-CM | POA: Insufficient documentation

## 2019-05-28 DIAGNOSIS — L299 Pruritus, unspecified: Secondary | ICD-10-CM | POA: Insufficient documentation

## 2019-05-28 DIAGNOSIS — J45909 Unspecified asthma, uncomplicated: Secondary | ICD-10-CM | POA: Insufficient documentation

## 2019-05-28 DIAGNOSIS — Z91010 Allergy to peanuts: Secondary | ICD-10-CM | POA: Insufficient documentation

## 2019-05-28 DIAGNOSIS — Z7984 Long term (current) use of oral hypoglycemic drugs: Secondary | ICD-10-CM | POA: Diagnosis not present

## 2019-05-28 DIAGNOSIS — Z79899 Other long term (current) drug therapy: Secondary | ICD-10-CM | POA: Insufficient documentation

## 2019-05-28 MED ORDER — HYDROXYZINE HCL 10 MG PO TABS
10.0000 mg | ORAL_TABLET | Freq: Once | ORAL | Status: AC
  Administered 2019-05-28: 10 mg via ORAL
  Filled 2019-05-28: qty 1

## 2019-05-28 MED ORDER — HYDROXYZINE HCL 10 MG PO TABS: 10.0000 mg | ORAL_TABLET | Freq: Four times a day (QID) | ORAL | 0 refills | Status: DC
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
## 2019-05-28 NOTE — Discharge Instructions (Addendum)
Take Atarax as needed as prescribed for itching.  Do not take Benadryl if you are taking Atarax. If itching to the face persists, you may apply cortisone mixed with CeraVe or Eucerin lotion. Follow-up with your primary care provider if you continue to develop hives.

## 2019-05-28 NOTE — ED Triage Notes (Signed)
Pt reports waking up today w/ her face itching.  Her face does appear red and w/ some hives.  Pt states she took benadryl twice however that has not relieved the itch.  Pt states she did use new "face stuff" yesterday.

## 2019-05-31 MED FILL — hydrOXYzine HCL 10 MG TABS: 10 | 3 days supply | Qty: 12 | Fill #0

## 2019-06-16 ENCOUNTER — Other Ambulatory Visit: Payer: Self-pay | Admitting: Family Medicine

## 2019-06-16 DIAGNOSIS — I5189 Other ill-defined heart diseases: Secondary | ICD-10-CM

## 2019-06-16 DIAGNOSIS — E119 Type 2 diabetes mellitus without complications: Secondary | ICD-10-CM

## 2019-06-16 DIAGNOSIS — I10 Essential (primary) hypertension: Secondary | ICD-10-CM

## 2019-06-16 MED FILL — hydrOXYzine HCL 10 MG TABS: 10 | 3 days supply | Qty: 12 | Fill #0

## 2019-06-16 MED FILL — SPIRONOLACTONE 25 MG TABS: 25 | 90 days supply | Qty: 90 | Fill #0

## 2019-06-16 MED FILL — FARXIGA 10 MG TABLET: 10 | 30 days supply | Qty: 30 | Fill #0

## 2019-06-16 MED FILL — NIFEdipine ER OSMOTIC RELEA: 30 | 30 days supply | Qty: 30 | Fill #0

## 2019-06-16 MED FILL — LOSARTAN POTASSIUM 100 MG T: 100 | 30 days supply | Qty: 30 | Fill #0

## 2019-06-16 MED FILL — FREESTYLE LITE TEST STRIP: 50 days supply | Qty: 100 | Fill #3

## 2019-07-16 ENCOUNTER — Other Ambulatory Visit: Payer: Self-pay

## 2019-07-16 ENCOUNTER — Other Ambulatory Visit: Payer: Self-pay | Admitting: Family Medicine

## 2019-07-16 MED FILL — LOSARTAN POTASSIUM 100 MG T: 100 | 30 days supply | Qty: 30 | Fill #0

## 2019-07-16 NOTE — Telephone Encounter (Signed)
Pt has upcoming appt 

## 2019-07-19 ENCOUNTER — Encounter: Payer: 59 | Admitting: Family Medicine

## 2019-07-20 ENCOUNTER — Encounter (HOSPITAL_COMMUNITY): Payer: Self-pay | Admitting: Emergency Medicine

## 2019-07-20 ENCOUNTER — Emergency Department (HOSPITAL_COMMUNITY)
Admission: EM | Admit: 2019-07-20 | Discharge: 2019-07-21 | Disposition: A | Discharge status: Home / Self Care | Payer: 59 | Attending: Emergency Medicine | Admitting: Emergency Medicine

## 2019-07-20 ENCOUNTER — Emergency Department (HOSPITAL_COMMUNITY): Payer: 59

## 2019-07-20 ENCOUNTER — Other Ambulatory Visit: Payer: Self-pay

## 2019-07-20 DIAGNOSIS — Z91010 Allergy to peanuts: Secondary | ICD-10-CM | POA: Insufficient documentation

## 2019-07-20 DIAGNOSIS — I10 Essential (primary) hypertension: Secondary | ICD-10-CM | POA: Diagnosis not present

## 2019-07-20 DIAGNOSIS — Z7984 Long term (current) use of oral hypoglycemic drugs: Secondary | ICD-10-CM | POA: Diagnosis not present

## 2019-07-20 DIAGNOSIS — U071 COVID-19: Secondary | ICD-10-CM | POA: Insufficient documentation

## 2019-07-20 DIAGNOSIS — Z79899 Other long term (current) drug therapy: Secondary | ICD-10-CM | POA: Insufficient documentation

## 2019-07-20 DIAGNOSIS — J45909 Unspecified asthma, uncomplicated: Secondary | ICD-10-CM | POA: Insufficient documentation

## 2019-07-20 DIAGNOSIS — R0602 Shortness of breath: Secondary | ICD-10-CM | POA: Diagnosis not present

## 2019-07-20 NOTE — ED Triage Notes (Signed)
Patient c/o shortness of breath onset of yesterday while just sitting in the car. Patient c/o tightness in center of chest. Used home nebulizer at home with minor relief. Lung sounds clear.

## 2019-07-21 ENCOUNTER — Telehealth: Payer: Self-pay

## 2019-07-21 ENCOUNTER — Other Ambulatory Visit: Payer: 59

## 2019-07-21 ENCOUNTER — Other Ambulatory Visit: Payer: Self-pay | Admitting: Family Medicine

## 2019-07-21 ENCOUNTER — Telehealth: Payer: Self-pay | Admitting: *Deleted

## 2019-07-21 DIAGNOSIS — Z79899 Other long term (current) drug therapy: Secondary | ICD-10-CM | POA: Diagnosis not present

## 2019-07-21 DIAGNOSIS — U071 COVID-19: Secondary | ICD-10-CM | POA: Diagnosis not present

## 2019-07-21 DIAGNOSIS — Z91010 Allergy to peanuts: Secondary | ICD-10-CM | POA: Diagnosis not present

## 2019-07-21 DIAGNOSIS — Z7984 Long term (current) use of oral hypoglycemic drugs: Secondary | ICD-10-CM | POA: Diagnosis not present

## 2019-07-21 DIAGNOSIS — E119 Type 2 diabetes mellitus without complications: Secondary | ICD-10-CM

## 2019-07-21 DIAGNOSIS — J45909 Unspecified asthma, uncomplicated: Secondary | ICD-10-CM | POA: Diagnosis not present

## 2019-07-21 DIAGNOSIS — I10 Essential (primary) hypertension: Secondary | ICD-10-CM | POA: Diagnosis not present

## 2019-07-21 LAB — POC SARS CORONAVIRUS 2 AG -  ED: SARS Coronavirus 2 Ag: POSITIVE — AB

## 2019-07-21 MED ORDER — IPRATROPIUM BROMIDE 0.02 % IN SOLN: 0.5000 mg | Freq: Four times a day (QID) | RESPIRATORY_TRACT | 0 refills | Status: DC

## 2019-07-21 MED ORDER — ONDANSETRON 4 MG PO TBDP: 4.0000 mg | ORAL_TABLET | Freq: Three times a day (TID) | ORAL | 0 refills | Status: DC | PRN

## 2019-07-21 MED ORDER — PREDNISONE 20 MG PO TABS: ORAL_TABLET | ORAL | 0 refills | Status: DC

## 2019-07-21 MED ORDER — PREDNISONE 20 MG PO TABS
60.0000 mg | ORAL_TABLET | Freq: Once | ORAL | Status: AC
  Administered 2019-07-21: 60 mg via ORAL
  Filled 2019-07-21: qty 3

## 2019-07-21 MED ORDER — ALBUTEROL SULFATE (5 MG/ML) 0.5% IN NEBU: 2.5000 mg | INHALATION_SOLUTION | Freq: Four times a day (QID) | RESPIRATORY_TRACT | 0 refills | Status: DC | PRN

## 2019-07-21 MED ORDER — ALBUTEROL SULFATE (2.5 MG/3ML) 0.083% IN NEBU: 5.0000 mg | INHALATION_SOLUTION | Freq: Once | RESPIRATORY_TRACT | Status: DC

## 2019-07-21 MED ORDER — IPRATROPIUM BROMIDE 0.02 % IN SOLN: 0.5000 mg | Freq: Once | RESPIRATORY_TRACT | Status: DC

## 2019-07-21 MED ORDER — BENZONATATE 100 MG PO CAPS: 100.0000 mg | ORAL_CAPSULE | Freq: Three times a day (TID) | ORAL | 0 refills | Status: DC

## 2019-07-21 MED FILL — FARXIGA 10 MG TABLET: 10 | 30 days supply | Qty: 30 | Fill #0

## 2019-07-21 MED FILL — BENZONATATE 100 MG CAPS: 100 | 7 days supply | Qty: 21 | Fill #0

## 2019-07-21 MED FILL — IPRATROPIUM BR 0.02% SOLN: 0.02 | 7 days supply | Qty: 75 | Fill #0

## 2019-07-21 MED FILL — ALBUTEROL 2.5 MG/0.5 ML SOL: 2.5 | 7 days supply | Qty: 30 | Fill #0

## 2019-07-21 MED FILL — ONDANSETRON ODT 4 MG TABLET: 4 | 3 days supply | Qty: 10 | Fill #0

## 2019-07-21 MED FILL — predniSONE 20 MG TABS: 20 | 9 days supply | Qty: 12 | Fill #0
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
## 2019-07-21 NOTE — Telephone Encounter (Signed)
Pt. Called back and her apt was changed to a virtual and was moved up to 1:30.

## 2019-07-21 NOTE — Telephone Encounter (Signed)
I called pt. And had to LM with her to call back to change her apt. To a virtual and move it up on the scheduled to an earlier time.

## 2019-07-21 NOTE — Discharge Instructions (Signed)
Medications have been sent to your pharmacy.  I recommend scheduled nebs every 4 hours for at least the next 24 to 48 hours until breathing is improving.  Reserve inhaler for rescue. You will need to quarantine for at least 14 days past date of symptom onset.  You will need to notify your employer. Try to quarantine as best you can away from family members to reduce risk of their exposure. Return to the ED for new or worsening symptoms--chest pain, difficulty breathing, uncontrolled fever, etc.

## 2019-07-21 NOTE — ED Notes (Signed)
Discharge instructions reviewed with patient. Pt verbalized understanding of quarantine, medication regimen, cleaning practices at home in common areas of household where quarantine is near impossible. Pt demonstrated use of incentive spirometer.

## 2019-07-21 NOTE — Progress Notes (Signed)
Start time: 1:29 End time: 2:09  Virtual Visit via Video Note  I connected with Victoria [name] on 07/21/19 at  1:30 PM EST by a video enabled telemedicine application and verified that I am speaking with the correct person using two identifiers.  Location: Patient: home Provider: office   I discussed the limitations of evaluation and management by telemedicine and the availability of in person appointments. The patient expressed understanding and agreed to proceed.  History of Present Illness:  Chief Complaint  Patient presents with  . Hypertension    VIRTUAL med check and postive COVID follow up. Lost smell and taste today.    Patient was originally scheduled for in-office routine med check, but has since gotten sick and tested positive for COVID-19 on 07/21/19. She had ER evaluation, and was sent home with nebs q4 hours, and rx for prednisone, zofran and tessalon.  She hasn't started any of these yet, just doing the nebulizers, which are helping a lot. Shortness of breath had started earlier this week, which she had attributed to her asthma. Children have recently been sick with colds. Today she lost her smell and taste. Stuffy nose and some throat-clearing since yesterday. She denies any significant cough. After the nebulizer treatment, shortness of breath resolves.  Not having chest pain.  She got called today by NP--she qualifies for monoclonal antibody treatment for COVID, but declined.  Hypertension follow-up:  Procardia XL 41m was added to her Losartan and spironolactone on 05/26/2019.  She has been onLosartan dose of 1050msince earlier in December. Last K+ was normal on this regimen Lab Results  Component Value Date   K 4.0 05/26/2019   BP's had been well controlled on losartan with amlodipine, but she couldn't tolerate amlodipine due to headaches, even at low dose. BP's were too high on losartan and spironolactone alone. She reports that her BP's are running  140-150's/80s, pulse 65-70's, and reported that procardia was also causing headaches (doesn't recall having headaches from it in the past).  Headaches are between her eyes, behind her eyes.  She reports that she was sick and nauseated when she first started it. She admits she didn't start it right away, still on the first bottle.  When she went and got her bottle of medications--"it doesn't look like I've touched it"--had a full bottle, and hasn't taken it recently, if at all. BP Readings from Last 3 Encounters:  07/22/19 (!) 179/121  07/21/19 (!) 153/98  05/28/19 123/79    Hyperlipidemia:  She only started taking atorvastatin in December, as reported to [place]. But, when she got her pill bottle today (since it is virtual visit and she is at home)--realized it was in her other bag of medicine, and she hasn't taken it at all.  Lab Results  Component Value Date   CHOL 167 04/15/2019   HDL 48 04/15/2019   LDLCALC 102 (H) 04/15/2019   TRIG 91 04/15/2019   CHOLHDL 3.5 04/15/2019    Diabetes:  She is taking Metformin 100050mith dinner (previously told me she was taking 1/2 tablet BID; this is not an extended release medication), along with FarIranhe reports she isn't taking Januvia. She has the bottle "didn't know it was in there", hasn't been taking. Filled in September, and full bottle. Fasting sugar are running 111-125, highest 145 after cake the night before 2 hours after meals are 120-130 range.  She denies hypoglycemia, polydipsia, polyuria. Lab Results  Component Value Date   HGBA1C 6.3 (A)  04/15/2019    OSA--compliant with CPAP, and she feels "a whole lot better".  PMH, PSH, SH reviewed  Outpatient Encounter Medications as of 07/22/2019  Medication Sig Note  . acetaminophen (TYLENOL) 325 MG tablet Take 650 mg by mouth every 6 (six) hours as needed.   Marland Kitchen albuterol (PROVENTIL) (5 MG/ML) 0.5% nebulizer solution Take 0.5 mLs (2.5 mg total) by nebulization every 6 (six) hours  as needed for wheezing or shortness of breath.   . APPLE CIDER VINEGAR PO Take 2 each by mouth 2 (two) times daily. 05/26/2019: goli  . FARXIGA 10 MG TABS tablet TAKE 1 TABLET BY MOUTH ONCE DAILY   . glucose blood (FREESTYLE LITE) test strip USE AS DIRECTED 2 (TWO) TIMES DAILY   . ipratropium (ATROVENT) 0.02 % nebulizer solution Take 2.5 mLs (0.5 mg total) by nebulization 4 (four) times daily.   Marland Kitchen losartan (COZAAR) 100 MG tablet TAKE 1 TABLET (100 MG TOTAL) BY MOUTH DAILY.   . metFORMIN (GLUCOPHAGE) 1000 MG tablet Take 0.5 tablets (500 mg total) by mouth 2 (two) times daily with a meal. 07/22/2019: Taking 1 tablet once daily with dinner  . spironolactone (ALDACTONE) 25 MG tablet TAKE 1 TABLET BY MOUTH DAILY.   Marland Kitchen atorvastatin (LIPITOR) 20 MG tablet Take 1 tablet (20 mg total) by mouth daily. (Patient not taking: Reported on 07/22/2019) 07/22/2019: Hasn't started  . benzonatate (TESSALON) 100 MG capsule Take 1 capsule (100 mg total) by mouth every 8 (eight) hours. (Patient not taking: Reported on 07/22/2019)   . ibuprofen (ADVIL) 800 MG tablet Take 1 tablet (800 mg total) by mouth 3 (three) times daily as needed. (Patient not taking: Reported on 04/15/2019)   . Multiple Vitamins-Minerals (WOMENS DAILY FORMULA PO) Take 1 tablet by mouth daily. 07/22/2019: Ran out  . NIFEdipine (PROCARDIA XL) 30 MG 24 hr tablet Take 1 tablet (30 mg total) by mouth daily. (Patient not taking: Reported on 07/22/2019) 07/22/2019: Found bottle--hadn't been taking it  . ondansetron (ZOFRAN ODT) 4 MG disintegrating tablet Take 1 tablet (4 mg total) by mouth every 8 (eight) hours as needed for nausea. (Patient not taking: Reported on 07/22/2019)   . predniSONE (DELTASONE) 20 MG tablet Take 40 mg by mouth daily for 3 days, then 55m by mouth daily for 3 days, then 153mdaily for 3 days (Patient not taking: Reported on 07/22/2019) 07/22/2019: Hasn't started it yet  . sitaGLIPtin (JANUVIA) 50 MG tablet Take 1 tablet (50 mg total) by mouth  daily. (Patient not taking: Reported on 07/22/2019)   . [DISCONTINUED] hydrOXYzine (ATARAX/VISTARIL) 10 MG tablet Take 1 tablet (10 mg total) by mouth every 6 (six) hours.   . [DISCONTINUED] ipratropium (ATROVENT) 0.06 % nasal spray Place 2 sprays into both nostrils 4 (four) times daily. (Patient not taking: Reported on 01/03/2019) 08/20/2018: Uses prn  . [DISCONTINUED] Vitamin D, Ergocalciferol, (DRISDOL) 1.25 MG (50000 UT) CAPS capsule Take 1 capsule (50,000 Units total) by mouth every 7 (seven) days.    No facility-administered encounter medications on file as of 07/22/2019.   Allergies  Allergen Reactions  . Dilaudid [Hydromorphone Hcl] Hives  . Morphine And Related Hives  . Peanut-Containing Drug Products Hives  . Strawberry Extract Swelling    Swelling is of the eye.   ROS:  URI symptoms per HPI.  No fever, chills nausea, vomiting, diarrhea, rashes, bleeding, bruising, chest pain.  See HPI for details.      Observations/Objective:  BP (!) 179/121   Pulse 80   Temp (!)  97.4 F (36.3 C) (Oral)   Ht 5' 4"  (1.626 m)   Wt 284 lb (128.8 kg)   LMP 07/22/2019   BMI 48.75 kg/m   Wt Readings from Last 3 Encounters:  07/22/19 284 lb (128.8 kg)  07/20/19 284 lb (128.8 kg)  05/26/19 289 lb 3.2 oz (131.2 kg)   Tired, but well-appearing female, in no distress. She is speaking easily and comfortably, with no cough or distress She is alert, oriented. Cranial nerves grossly intact Exam is limited due to virtual nature of the visit.   Assessment and Plan:  COVID-19 virus infection - encouraged her to get monoclonal AB treatment.  Supportive measures reviewed, COVID monitoring ordered - Plan: MYCHART COVID-19 HOME MONITORING PROGRAM, Temperature monitoring  Essential hypertension - noncompliant with procardia, BP high.  Monitor BP, take meds as directed. f/u 2-3 weeks on BP  Diabetes mellitus type 2 with complications ([place]) - sugars sound okay by report, but she accidentally stopped  taking Januvia. to restart, along with farxiga and metformin. Restart atorvastatin  OSA (obstructive sleep apnea) - cont CPAP.  Discussed using Afrin if has difficulty related to nasal congestion related to COVID  Vitamin D deficiency - due for recheck. Encouraged daily supplement  Noncompliance with medications - getting confused, not keeping active rx's together and leaving off meds. Encouraged her to set up pill back and reach out if further assistance is needed   F/u 2-3 weeks, OV for BP.  A1c and nonfasting labs (D, TSH, c-met, urine microalb) Hold off on lipids since she hasn't been taking statin--fasting lab can be ordered as separate lab visit.   START TAKING ATORVASTATIN DAILY.  This is your cholesterol medication Continue condom use.  START TAKING YOUR NIFEDIPINE (PROCARDIA) EVERY DAY FOR YOUR BLOOD PRESSURE, IN ADDITION TO THE LOSARTAN  Monitor your blood pressure and pulse, and record on a piece of paper. Contact us if you are unable to tolerate this, or if you side effects or issues.  RESTART THE JANUVIA (sitagliptin)--I believe you were taking this when the last A1c was checked and was great, so this regimen was working well. Take it once daily. Continue to monitor your sugars regularly and bring your list to your next visit.  Expect your sugars to get high when you start the prednisone, these should come down as the dose decreases and once you finish the course.  Consider using a pill box to help you not lose track of your medications.     Follow Up Instructions:    I discussed the assessment and treatment plan with the patient. The patient was provided an opportunity to ask questions and all were answered. The patient agreed with the plan and demonstrated an understanding of the instructions.   The patient was advised to call back or seek an in-person evaluation if the symptoms worsen or if the condition fails to improve as anticipated.  I provided 40 minutes  of video face-to-face time during this encounter. An additional 10 mins or more spent in chart review and documentation.   Vikki Ports, MD

## 2019-07-21 NOTE — Telephone Encounter (Signed)
Pharmacy called related to Rx: albuterol concentrated solution .Marland KitchenMarland KitchenEDCM clarified with EDP (Lawyer) to change Rx to: premix albuterol solution.

## 2019-07-22 ENCOUNTER — Other Ambulatory Visit: Payer: Self-pay

## 2019-07-22 ENCOUNTER — Telehealth: Payer: Self-pay | Admitting: Physician Assistant

## 2019-07-22 ENCOUNTER — Encounter: Payer: Self-pay | Admitting: Physician Assistant

## 2019-07-22 ENCOUNTER — Encounter: Payer: 59 | Admitting: Family Medicine

## 2019-07-22 ENCOUNTER — Ambulatory Visit (INDEPENDENT_AMBULATORY_CARE_PROVIDER_SITE_OTHER): Payer: 59 | Admitting: Family Medicine

## 2019-07-22 VITALS — BP 179/121 | HR 80 | Temp 97.4°F | Ht 64.0 in | Wt 284.0 lb

## 2019-07-22 DIAGNOSIS — Z9114 Patient's other noncompliance with medication regimen: Secondary | ICD-10-CM

## 2019-07-22 DIAGNOSIS — E118 Type 2 diabetes mellitus with unspecified complications: Secondary | ICD-10-CM | POA: Diagnosis not present

## 2019-07-22 DIAGNOSIS — I10 Essential (primary) hypertension: Secondary | ICD-10-CM | POA: Diagnosis not present

## 2019-07-22 DIAGNOSIS — G4733 Obstructive sleep apnea (adult) (pediatric): Secondary | ICD-10-CM | POA: Diagnosis not present

## 2019-07-22 DIAGNOSIS — E559 Vitamin D deficiency, unspecified: Secondary | ICD-10-CM

## 2019-07-22 DIAGNOSIS — U071 COVID-19: Secondary | ICD-10-CM | POA: Diagnosis not present

## 2019-07-22 NOTE — Telephone Encounter (Signed)
Called to discuss with [name] about Covid symptoms and the use of bamlanivimab or casirivimab/imdevimab, a monoclonal antibody infusion for those with mild to moderate Covid symptoms and at a high risk of hospitalization.     Pt is qualified for this infusion at the [facility] infusion center due to co-morbid conditions and/or a member of an at-risk group, however declines infusion at this time. Symptoms tier reviewed as well as criteria for ending isolation.  Symptoms reviewed that would warrant ED/Hospital evaluation. Preventative practices reviewed. Patient verbalized understanding. Patient advised to call back if he decides that he does want to get infusion. Callback number to the infusion center given. Patient advised to go to Urgent care or ED with severe symptoms. Last date pt would be eligible for infusion is 07/29/19. I have sent her a Mychart message with more information and she will call us back if she changes her mind.    Patient Active Problem List   Diagnosis Date Noted  . Morbid obesity ([place])   . Dizziness 04/05/2018  . Asthma 12/03/2017  . SOB (shortness of breath) 12/03/2017  . Lactic acid acidosis 12/03/2017  . Pericarditis 12/03/2017  . Atypical chest pain   . Hypokalemia 12/01/2017  . S/P cesarean section 03/08/2016  . Morbid obesity with BMI of 50.0-59.9, adult ([place]) 10/12/2014  . Obesity, morbid, BMI 40.0-49.9 ([place]) 09/22/2013  . Vitamin D deficiency 05/13/2012  . Dermoid cyst of ovary 09/19/2011  . UTI (urinary tract infection) 09/19/2011  . OSA (obstructive sleep apnea) 09/01/2011  . Controlled type 2 diabetes mellitus with microalbuminuria, without long-term current use of insulin ([place]) 02/18/2011  . Asthma exacerbation 02/18/2011  . Essential hypertension, benign 02/18/2011    Angelena Form PA-C

## 2019-07-22 NOTE — Patient Instructions (Addendum)
I encourage you to reconsider the monoclonal antibody treatments, as we discussed today.  If you need the phone number again, contact us.  You have only a 10 day window from start of symptoms to get this treatment.  START TAKING ATORVASTATIN DAILY.  This is your cholesterol medication Continue condom use.  START TAKING YOUR NIFEDIPINE (PROCARDIA) EVERY DAY FOR YOUR BLOOD PRESSURE, IN ADDITION TO THE LOSARTAN  Monitor your blood pressure and pulse, and record on a piece of paper. Contact us if you are unable to tolerate this, or if you side effects or issues.  RESTART THE JANUVIA (sitagliptin)--I believe you were taking this when the last A1c was checked and was great, so this regimen was working well. Take it once daily. Continue to monitor your sugars regularly and bring your list to your next visit.  Expect your sugars to get high when you start the prednisone, these should come down as the dose decreases and once you finish the course.  Consider using a pill box to help you not lose track of your medications.   COVID-19 COVID-19 is a respiratory infection that is caused by a virus called severe acute respiratory syndrome coronavirus 2 (SARS-CoV-2). The disease is also known as coronavirus disease or novel coronavirus. In some people, the virus may not cause any symptoms. In others, it may cause a serious infection. The infection can get worse quickly and can lead to complications, such as:  Pneumonia, or infection of the lungs.  Acute respiratory distress syndrome or ARDS. This is a condition in which fluid build-up in the lungs prevents the lungs from filling with air and passing oxygen into the blood.  Acute respiratory failure. This is a condition in which there is not enough oxygen passing from the lungs to the body or when carbon dioxide is not passing from the lungs out of the body.  Sepsis or septic shock. This is a serious bodily reaction to an infection.  Blood clotting  problems.  Secondary infections due to bacteria or fungus.  Organ failure. This is when your body's organs stop working. The virus that causes COVID-19 is contagious. This means that it can spread from person to person through droplets from coughs and sneezes (respiratory secretions). What are the causes? This illness is caused by a virus. You may catch the virus by:  Breathing in droplets from an infected person. Droplets can be spread by a person breathing, speaking, singing, coughing, or sneezing.  Touching something, like a table or a doorknob, that was exposed to the virus (contaminated) and then touching your mouth, nose, or eyes. What increases the risk? Risk for infection You are more likely to be infected with this virus if you:  Are within 6 feet (2 meters) of a person with COVID-19.  Provide care for or live with a person who is infected with COVID-19.  Spend time in crowded indoor spaces or live in shared housing. Risk for serious illness You are more likely to become seriously ill from the virus if you:  Are 11 years of age or older. The higher your age, the more you are at risk for serious illness.  Live in a nursing home or long-term care facility.  Have cancer.  Have a long-term (chronic) disease such as: ? Chronic lung disease, including chronic obstructive pulmonary disease or asthma. ? A long-term disease that lowers your body's ability to fight infection (immunocompromised). ? Heart disease, including heart failure, a condition in which the arteries that  lead to the heart become narrow or blocked (coronary artery disease), a disease which makes the heart muscle thick, weak, or stiff (cardiomyopathy). ? Diabetes. ? Chronic kidney disease. ? Sickle cell disease, a condition in which red blood cells have an abnormal "sickle" shape. ? Liver disease.  Are obese. What are the signs or symptoms? Symptoms of this condition can range from mild to severe. Symptoms  may appear any time from 2 to 14 days after being exposed to the virus. They include:  A fever or chills.  A cough.  Difficulty breathing.  Headaches, body aches, or muscle aches.  Runny or stuffy (congested) nose.  A sore throat.  New loss of taste or smell. Some people may also have stomach problems, such as nausea, vomiting, or diarrhea. Other people may not have any symptoms of COVID-19. How is this diagnosed? This condition may be diagnosed based on:  Your signs and symptoms, especially if: ? You live in an area with a COVID-19 outbreak. ? You recently traveled to or from an area where the virus is common. ? You provide care for or live with a person who was diagnosed with COVID-19. ? You were exposed to a person who was diagnosed with COVID-19.  A physical exam.  Lab tests, which may include: ? Taking a sample of fluid from the back of your nose and throat (nasopharyngeal fluid), your nose, or your throat using a swab. ? A sample of mucus from your lungs (sputum). ? Blood tests.  Imaging tests, which may include, X-rays, CT scan, or ultrasound. How is this treated? At present, there is no medicine to treat COVID-19. Medicines that treat other diseases are being used on a trial basis to see if they are effective against COVID-19. Your health care provider will talk with you about ways to treat your symptoms. For most people, the infection is mild and can be managed at home with rest, fluids, and over-the-counter medicines. Treatment for a serious infection usually takes places in a hospital intensive care unit (ICU). It may include one or more of the following treatments. These treatments are given until your symptoms improve.  Receiving fluids and medicines through an IV.  Supplemental oxygen. Extra oxygen is given through a tube in the nose, a face mask, or a hood.  Positioning you to lie on your stomach (prone position). This makes it easier for oxygen to get into  the lungs.  Continuous positive airway pressure (CPAP) or bi-level positive airway pressure (BPAP) machine. This treatment uses mild air pressure to keep the airways open. A tube that is connected to a motor delivers oxygen to the body.  Ventilator. This treatment moves air into and out of the lungs by using a tube that is placed in your windpipe.  Tracheostomy. This is a procedure to create a hole in the neck so that a breathing tube can be inserted.  Extracorporeal membrane oxygenation (ECMO). This procedure gives the lungs a chance to recover by taking over the functions of the heart and lungs. It supplies oxygen to the body and removes carbon dioxide. Follow these instructions at home: Lifestyle  If you are sick, stay home except to get medical care. Your health care provider will tell you how long to stay home. Call your health care provider before you go for medical care.  Rest at home as told by your health care provider.  Do not use any products that contain nicotine or tobacco, such as cigarettes, e-cigarettes, and chewing  tobacco. If you need help quitting, ask your health care provider.  Return to your normal activities as told by your health care provider. Ask your health care provider what activities are safe for you. General instructions  Take over-the-counter and prescription medicines only as told by your health care provider.  Drink enough fluid to keep your urine pale yellow.  Keep all follow-up visits as told by your health care provider. This is important. How is this prevented?  There is no vaccine to help prevent COVID-19 infection. However, there are steps you can take to protect yourself and others from this virus. To protect yourself:   Do not travel to areas where COVID-19 is a risk. The areas where COVID-19 is reported change often. To identify high-risk areas and travel restrictions, check the CDC travel website: FatFares.com.br  If you live  in, or must travel to, an area where COVID-19 is a risk, take precautions to avoid infection. ? Stay away from people who are sick. ? Wash your hands often with soap and water for 20 seconds. If soap and water are not available, use an alcohol-based hand sanitizer. ? Avoid touching your mouth, face, eyes, or nose. ? Avoid going out in public, follow guidance from your state and local health authorities. ? If you must go out in public, wear a cloth face covering or face mask. Make sure your mask covers your nose and mouth. ? Avoid crowded indoor spaces. Stay at least 6 feet (2 meters) away from others. ? Disinfect objects and surfaces that are frequently touched every day. This may include:  Counters and tables.  Doorknobs and light switches.  Sinks and faucets.  Electronics, such as phones, remote controls, keyboards, computers, and tablets. To protect others: If you have symptoms of COVID-19, take steps to prevent the virus from spreading to others.  If you think you have a COVID-19 infection, contact your health care provider right away. Tell your health care team that you think you may have a COVID-19 infection.  Stay home. Leave your house only to seek medical care. Do not use public transport.  Do not travel while you are sick.  Wash your hands often with soap and water for 20 seconds. If soap and water are not available, use alcohol-based hand sanitizer.  Stay away from other members of your household. Let healthy household members care for children and pets, if possible. If you have to care for children or pets, wash your hands often and wear a mask. If possible, stay in your own room, separate from others. Use a different bathroom.  Make sure that all people in your household wash their hands well and often.  Cough or sneeze into a tissue or your sleeve or elbow. Do not cough or sneeze into your hand or into the air.  Wear a cloth face covering or face mask. Make sure your  mask covers your nose and mouth. Where to find more information  Centers for Disease Control and Prevention: PurpleGadgets.be  World Health Organization: https://www.castaneda.info/ Contact a health care provider if:  You live in or have traveled to an area where COVID-19 is a risk and you have symptoms of the infection.  You have had contact with someone who has COVID-19 and you have symptoms of the infection. Get help right away if:  You have trouble breathing.  You have pain or pressure in your chest.  You have confusion.  You have bluish lips and fingernails.  You have difficulty waking  from sleep.  You have symptoms that get worse. These symptoms may represent a serious problem that is an emergency. Do not wait to see if the symptoms will go away. Get medical help right away. Call your local emergency services (911 in the [place] Do not drive yourself to the hospital. Let the emergency medical personnel know if you think you have COVID-19. Summary  COVID-19 is a respiratory infection that is caused by a virus. It is also known as coronavirus disease or novel coronavirus. It can cause serious infections, such as pneumonia, acute respiratory distress syndrome, acute respiratory failure, or sepsis.  The virus that causes COVID-19 is contagious. This means that it can spread from person to person through droplets from breathing, speaking, singing, coughing, or sneezing.  You are more likely to develop a serious illness if you are 40 years of age or older, have a weak immune system, live in a nursing home, or have chronic disease.  There is no medicine to treat COVID-19. Your health care provider will talk with you about ways to treat your symptoms.  Take steps to protect yourself and others from infection. Wash your hands often and disinfect objects and surfaces that are frequently touched every day. Stay away from people who are sick and wear  a mask if you are sick. This information is not intended to replace advice given to you by your health care provider. Make sure you discuss any questions you have with your health care provider. Document Revised: 03/12/2019 Document Reviewed: 06/18/2018 Elsevier Patient Education  [place].

## 2019-07-23 ENCOUNTER — Encounter: Payer: Self-pay | Admitting: Family Medicine

## 2019-07-26 ENCOUNTER — Encounter: Payer: Self-pay | Admitting: *Deleted

## 2019-07-26 NOTE — Progress Notes (Signed)
Pt states she will call back to schedule.

## 2019-07-29 ENCOUNTER — Ambulatory Visit: Payer: 59 | Attending: Internal Medicine

## 2019-07-29 DIAGNOSIS — Z20822 Contact with and (suspected) exposure to covid-19: Secondary | ICD-10-CM | POA: Diagnosis not present

## 2019-07-30 LAB — NOVEL CORONAVIRUS, NAA: SARS-CoV-2, NAA: DETECTED — AB

## 2019-08-10 ENCOUNTER — Telehealth: Payer: Self-pay | Admitting: Family Medicine

## 2019-08-10 NOTE — Telephone Encounter (Signed)
Requested records received from [facility]

## 2019-08-18 ENCOUNTER — Other Ambulatory Visit: Payer: Self-pay | Admitting: Family Medicine

## 2019-08-18 DIAGNOSIS — E119 Type 2 diabetes mellitus without complications: Secondary | ICD-10-CM

## 2019-08-18 MED FILL — FREESTYLE LITE TEST STRIP: 50 days supply | Qty: 100 | Fill #4

## 2019-08-18 MED FILL — FARXIGA 10 MG TABLET: 10 | 30 days supply | Qty: 30 | Fill #0

## 2019-08-18 MED FILL — LOSARTAN POTASSIUM 100 MG T: 100 | 30 days supply | Qty: 30 | Fill #0

## 2019-08-18 MED FILL — ATORVASTATIN 20 MG TABLET: 20 | 30 days supply | Qty: 30 | Fill #1

## 2019-08-18 MED FILL — metFORMIN HCL 1000 MG TABS: 1000 | 90 days supply | Qty: 180 | Fill #0

## 2019-08-19 MED FILL — AMOXICILLIN 500 MG CAPSULE: 500 | 7 days supply | Qty: 21 | Fill #0

## 2019-08-24 NOTE — Progress Notes (Signed)
Chief Complaint  Patient presents with  . Diabetes    fasting med check, no concerns. Will give urine on way out for microalb.    Patient presents for f/u on HTN.  She is taking losartan 131m daily, and spironolactone. She couldn't tolerate amlodipine due to headaches, so procardia XL 376mwas prescribed.  At her visit in February (done virtually due to her having COVID), she found her bottle and realized she hadn't been taking the procardia. BP was very high at that visit. She restarted it after last visit, and it caused more headaches, which resolved upon stopping the procardia.  BP at work recently 135/94. Diastolic is always in the 9092'Esystolic [id number] Compliant with CPAP. Has seen cardiologist in the past--Fall 2019 for hospital f/u from pericarditis, not since.  Hyperlipidemia:  She also found her atorvastatin bottle at home, untouched, when doing virtual visit last month.  (It got put with the wrong bag of medicine, ones she wasn't taking).  Since last visit she has started taking atorvastatin. She denies side effects. She is using condoms to prevent pregnancy. Plans to see GYN to get on birth control.  Diabetes:  She also had issues with her diabetes medications, found during virtual visit.  She found a full bottle of Januvia that she hadn't been taking, supposed to be taking along with her FaWilder Gladend metformin. She states she NEVER took the [place] so never restarted it.  She had gotten her sugars down by changing her diet. She reports her sugars remain good. Highest sugar was 150 (had cake); usually 80's-125, mostly 110-120. She is currently taking FaIrannd metformin. She denies hypoglycemia, polydipsia, polyuria.  Lab Results  Component Value Date   HGBA1C 6.3 (A) 04/15/2019   Vitamin D deficiency:  Last level was 18.1 in 12/2018.  She was treated with prescription. She is currently taking MVI daily, no separate D.  She had COVID-19 in February, and had  declined treatment with monoclonal antibody. She reports she got better in a few days.  She has some mild intermittent brain fog.  Smell/taste recurred after 4 days.   PMH, PSH, SH reviewed  Outpatient Encounter Medications as of 08/25/2019  Medication Sig Note  . acetaminophen (TYLENOL) 325 MG tablet Take 650 mg by mouth every 6 (six) hours as needed.   . Marland Kitchenmoxicillin (AMOXIL) 500 MG capsule Take 500 mg by mouth 3 (three) times daily. 08/25/2019: Had tooth pulled, rx'd by dentist  . APPLE CIDER VINEGAR PO Take 2 each by mouth 2 (two) times daily. 05/26/2019: goli  . atorvastatin (LIPITOR) 20 MG tablet Take 1 tablet (20 mg total) by mouth daily.   . Marland KitchenARXIGA 10 MG TABS tablet TAKE 1 TABLET BY MOUTH ONCE DAILY   . glucose blood (FREESTYLE LITE) test strip USE AS DIRECTED 2 (TWO) TIMES DAILY   . losartan (COZAAR) 100 MG tablet TAKE 1 TABLET (100 MG TOTAL) BY MOUTH DAILY.   . metFORMIN (GLUCOPHAGE) 1000 MG tablet TAKE 1 TABLET (1,000 MG TOTAL) BY MOUTH TWICE A DAY WITH A MEAL.   . Multiple Vitamins-Minerals (WOMENS DAILY FORMULA PO) Take 1 tablet by mouth daily.   . Marland Kitchenpironolactone (ALDACTONE) 25 MG tablet TAKE 1 TABLET BY MOUTH DAILY.   . Marland Kitchenlbuterol (PROVENTIL) (5 MG/ML) 0.5% nebulizer solution Take 0.5 mLs (2.5 mg total) by nebulization every 6 (six) hours as needed for wheezing or shortness of breath. (Patient not taking: Reported on 08/25/2019)   . ibuprofen (ADVIL) 800 MG tablet Take  1 tablet (800 mg total) by mouth 3 (three) times daily as needed. (Patient not taking: Reported on 04/15/2019)   . NIFEdipine (PROCARDIA XL) 30 MG 24 hr tablet Take 1 tablet (30 mg total) by mouth daily. (Patient not taking: Reported on 07/22/2019) 08/25/2019: Stopped due to headaches  . [DISCONTINUED] benzonatate (TESSALON) 100 MG capsule Take 1 capsule (100 mg total) by mouth every 8 (eight) hours. (Patient not taking: Reported on 07/22/2019)   . [DISCONTINUED] ipratropium (ATROVENT) 0.02 % nebulizer solution Take 2.5 mLs  (0.5 mg total) by nebulization 4 (four) times daily.   . [DISCONTINUED] ipratropium (ATROVENT) 0.06 % nasal spray Place 2 sprays into both nostrils 4 (four) times daily. (Patient not taking: Reported on 01/03/2019) 08/20/2018: Uses prn  . [DISCONTINUED] ondansetron (ZOFRAN ODT) 4 MG disintegrating tablet Take 1 tablet (4 mg total) by mouth every 8 (eight) hours as needed for nausea. (Patient not taking: Reported on 07/22/2019)   . [DISCONTINUED] predniSONE (DELTASONE) 20 MG tablet Take 40 mg by mouth daily for 3 days, then 58m by mouth daily for 3 days, then 189mdaily for 3 days (Patient not taking: Reported on 07/22/2019) 07/22/2019: Hasn't started it yet  . [DISCONTINUED] sitaGLIPtin (JANUVIA) 50 MG tablet Take 1 tablet (50 mg total) by mouth daily. (Patient not taking: Reported on 07/22/2019) 08/25/2019: never started   No facility-administered encounter medications on file as of 08/25/2019.   Allergies  Allergen Reactions  . Dilaudid [Hydromorphone Hcl] Hives  . Morphine And Related Hives  . Peanut-Containing Drug Products Hives  . Strawberry Extract Swelling    Swelling is of the eye.   ROS: no fever, chills, URI symptoms, loss of smell/taste (resolved).  Occasionally has some mild brain fog.  No chest pain, shortness of breath. Hasn't needed to use albuterol since illness last month.  No GI complaints, bleeding, bruising or other concerns.  Headaches resolved once procardia was stopped.   PHYSICAL EXAM:  BP (!) 150/100   Pulse 72   Temp 97.7 F (36.5 C) (Other (Comment))   Ht 5' 4"  (1.626 m)   Wt 282 lb 6.4 oz (128.1 kg)   BMI 48.47 kg/m   162/108 on repeat by MD, RA with large cuff.  Wt Readings from Last 3 Encounters:  08/25/19 282 lb 6.4 oz (128.1 kg)  07/22/19 284 lb (128.8 kg)  07/20/19 284 lb (128.8 kg)   Pleasant, obese, well-appearing female, in good spirits, in no distress HEENT conjunctiva and sclera are clear, EOMI, wearing mask Neck: no lymphadenopathy, thyromegaly  or carotid bruit Heart: regular rate and rhythm Lungs: clear bilaterally, no wheezes, rales, roncih Abdomen: obese, soft, nontender, no mass Extremities: no edema, 2+ pulses Neuro: alert and oriented, normal gait Psych: normal mood, affect, hygiene and grooming  Lab Results  Component Value Date   HGBA1C 6.4 (A) 08/25/2019    ASSESSMENT/PLAN:  Essential hypertension - suboptimally controlled; continue spironolactone and losartant.  Refer to cardiology for assistance given intolerances to meds. - Plan: Comprehensive metabolic panel, Ambulatory referral to Cardiology  Diabetes mellitus type 2 with complications ([place]- well controlled; Januvia not needed (d/c'd)--continue farxiga and metformin - Plan: HgB A1c, TSH, Comprehensive metabolic panel, Microalbumin / creatinine urine ratio  Vitamin D deficiency - compliant with MVI daily.  Recheck level. If low, needs additional D3 - Plan: VITAMIN D 25 Hydroxy (Vit-D Deficiency, Fractures)  Hypertension associated with diabetes ([facility] Medication monitoring encounter - Plan: VITAMIN D 25 Hydroxy (Vit-D Deficiency, Fractures), Comprehensive metabolic panel  A1c nonfasting labs (D, TSH, c-met, urine microalb) Hold off on lipids since she hasn't been taking statin long enough-  Recheck lipids at her f/u in 3 mos (will be fasting).   Refer back to cardiology (previously saw Dr. Meda Coffee related to pericarditis in Fall 2019) Need assistance with HTN management--intolerant of amlodipine and nifedipine. She is currently on spironolactone and losartan. She has h/o asthma. She has OSA, and is on CPAP therapy.  Unsure if needs cardiology visit vs HTN clinic, if I am able to refer directly there.

## 2019-08-25 ENCOUNTER — Other Ambulatory Visit: Payer: Self-pay

## 2019-08-25 ENCOUNTER — Ambulatory Visit: Payer: 59 | Admitting: Family Medicine

## 2019-08-25 ENCOUNTER — Encounter: Payer: Self-pay | Admitting: Family Medicine

## 2019-08-25 VITALS — BP 150/100 | HR 72 | Temp 97.7°F | Ht 64.0 in | Wt 282.4 lb

## 2019-08-25 DIAGNOSIS — E1159 Type 2 diabetes mellitus with other circulatory complications: Secondary | ICD-10-CM | POA: Diagnosis not present

## 2019-08-25 DIAGNOSIS — Z5181 Encounter for therapeutic drug level monitoring: Secondary | ICD-10-CM

## 2019-08-25 DIAGNOSIS — I10 Essential (primary) hypertension: Secondary | ICD-10-CM | POA: Diagnosis not present

## 2019-08-25 DIAGNOSIS — E118 Type 2 diabetes mellitus with unspecified complications: Secondary | ICD-10-CM | POA: Diagnosis not present

## 2019-08-25 DIAGNOSIS — I152 Hypertension secondary to endocrine disorders: Secondary | ICD-10-CM

## 2019-08-25 DIAGNOSIS — E559 Vitamin D deficiency, unspecified: Secondary | ICD-10-CM

## 2019-08-25 LAB — POCT GLYCOSYLATED HEMOGLOBIN (HGB A1C): Hemoglobin A1C: 6.4 % — AB (ref 4.0–5.6)

## 2019-08-25 NOTE — Patient Instructions (Signed)
Your diabetes is well controlled. Continue the farxiga and metformin.  Your blood pressure is not well controlled. Given the issues with side effects you have had to certain medications, and persistently elevated blood pressures, I'm going to send you back to the cardiologist for assistance in getting your blood pressure controlled.  Continue to work on [employer], exercise and weight loss.  We will recheck your cholesterol at next visit. Continue the lipitor.

## 2019-08-26 LAB — COMPREHENSIVE METABOLIC PANEL
ALT: 11 IU/L (ref 0–32)
AST: 13 IU/L (ref 0–40)
Albumin/Globulin Ratio: 1.4 (ref 1.2–2.2)
Albumin: 4 g/dL (ref 3.8–4.8)
Alkaline Phosphatase: 66 IU/L (ref 39–117)
BUN/Creatinine Ratio: 11 (ref 9–23)
BUN: 8 mg/dL (ref 6–20)
Bilirubin Total: 0.2 mg/dL (ref 0.0–1.2)
CO2: 21 mmol/L (ref 20–29)
Calcium: 9.3 mg/dL (ref 8.7–10.2)
Chloride: 105 mmol/L (ref 96–106)
Creatinine, Ser: 0.73 mg/dL (ref 0.57–1.00)
GFR calc Af Amer: 123 mL/min/{1.73_m2} (ref >59)
GFR calc non Af Amer: 106 mL/min/{1.73_m2} (ref >59)
Globulin, Total: 2.8 g/dL (ref 1.5–4.5)
Glucose: 82 mg/dL (ref 65–99)
Potassium: 4.3 mmol/L (ref 3.5–5.2)
Sodium: 137 mmol/L (ref 134–144)
Total Protein: 6.8 g/dL (ref 6.0–8.5)

## 2019-08-26 LAB — VITAMIN D 25 HYDROXY (VIT D DEFICIENCY, FRACTURES): Vit D, 25-Hydroxy: 18.4 ng/mL — ABNORMAL LOW (ref 30.0–100.0)

## 2019-08-26 LAB — MICROALBUMIN / CREATININE URINE RATIO
Creatinine, Urine: 85.1 mg/dL
Microalb/Creat Ratio: 60 mg/g creat — ABNORMAL HIGH (ref 0–29)
Microalbumin, Urine: 50.7 ug/mL

## 2019-08-26 LAB — TSH: TSH: 1.63 u[IU]/mL (ref 0.450–4.500)

## 2019-08-26 MED ORDER — VITAMIN D (ERGOCALCIFEROL) 1.25 MG (50000 UNIT) PO CAPS: 50000.0000 [IU] | ORAL_CAPSULE | ORAL | 0 refills | Status: DC

## 2019-08-26 NOTE — Addendum Note (Signed)
Addended by: Rita Ohara on: 08/26/2019 08:24 AM   Modules accepted: Orders
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
## 2019-09-10 ENCOUNTER — Telehealth: Payer: Self-pay | Admitting: *Deleted

## 2019-09-10 ENCOUNTER — Telehealth (INDEPENDENT_AMBULATORY_CARE_PROVIDER_SITE_OTHER): Payer: 59 | Admitting: Physician Assistant

## 2019-09-10 ENCOUNTER — Encounter: Payer: Self-pay | Admitting: Physician Assistant

## 2019-09-10 VITALS — BP 174/109 | HR 85 | Ht 64.0 in | Wt 282.0 lb

## 2019-09-10 DIAGNOSIS — I10 Essential (primary) hypertension: Secondary | ICD-10-CM

## 2019-09-10 DIAGNOSIS — Z6841 Body Mass Index (BMI) 40.0 and over, adult: Secondary | ICD-10-CM | POA: Diagnosis not present

## 2019-09-10 DIAGNOSIS — R809 Proteinuria, unspecified: Secondary | ICD-10-CM | POA: Diagnosis not present

## 2019-09-10 DIAGNOSIS — E1129 Type 2 diabetes mellitus with other diabetic kidney complication: Secondary | ICD-10-CM | POA: Diagnosis not present

## 2019-09-10 DIAGNOSIS — G4733 Obstructive sleep apnea (adult) (pediatric): Secondary | ICD-10-CM

## 2019-09-10 MED ORDER — CHLORTHALIDONE 25 MG PO TABS: 25.0000 mg | ORAL_TABLET | Freq: Every day | ORAL | 11 refills | Status: DC

## 2019-09-10 MED FILL — CHLORTHALIDONE 25 MG TABS: 25 | 30 days supply | Qty: 30 | Fill #0

## 2019-09-10 NOTE — Patient Instructions (Signed)
Medication Instructions:  Start Chlorthalidone 25 mg once daily   *If you need a refill on your cardiac medications before your next appointment, please call your pharmacy*  Lab Work: In 1 week - BMET  If you have labs (blood work) drawn today and your tests are completely normal, you will receive your results only by: Marland Kitchen MyChart Message (if you have MyChart) OR . A paper copy in the mail If you have any lab test that is abnormal or we need to change your treatment, we will call you to review the results.  Follow-Up: At [facility], you and your health needs are our priority.  As part of our continuing mission to provide you with exceptional heart care, we have created designated Provider Care Teams.  These Care Teams include your primary Cardiologist (physician) and Advanced Practice Providers (APPs -  Physician Assistants and Nurse Practitioners) who all work together to provide you with the care you need, when you need it.  We recommend signing up for the patient portal called "MyChart".  Sign up information is provided on this After Visit Summary.  MyChart is used to connect with patients for Virtual Visits (Telemedicine).  Patients are able to view lab/test results, encounter notes, upcoming appointments, etc.  Non-urgent messages can be sent to your provider as well.   To learn more about what you can do with MyChart, go to [email or web address].    Your next appointment:   2 week(s)  The format for your next appointment:   In Person (or Virtual) - whichever is easier   Provider:    You may see Ena Dawley, MD   or one of the following Advanced Practice Providers on your designated Care Team:    Melina Copa, PA-C  Ermalinda Barrios, PA-C  Or Richardson Dopp, PA-C if no available appt with Care Team   Other Instructions Try to exercise - walking 20-30 minutes a day can help lower your BP Great job on losing weight!  Keep it up as this will also help with control of your BP  I will get you set up to see Dr. Radford Pax (she is a cardiologist in our office) to help with your sleep apnea management and your CPAP machine Make sure you do not have pseudoephedrine or phenylephrine in your allergy medicine. If so, stop it because those medications can increase your blood pressure. Keep a log of your blood pressures and bring to your next appt. Maintain a low sodium diet (see below)   Low-Sodium Eating Plan Sodium, which is an element that makes up salt, helps you maintain a healthy balance of fluids in your body. Too much sodium can increase your blood pressure and cause fluid and waste to be held in your body. Your health care provider or dietitian may recommend following this plan if you have high blood pressure (hypertension), kidney disease, liver disease, or heart failure. Eating less sodium can help lower your blood pressure, reduce swelling, and protect your heart, liver, and kidneys. What are tips for following this plan? General guidelines  Most people on this plan should limit their sodium intake to 1,500-2,000 mg (milligrams) of sodium each day. Reading food labels   The Nutrition Facts label lists the amount of sodium in one serving of the food. If you eat more than one serving, you must multiply the listed amount of sodium by the number of servings.  Choose foods with less than 140 mg of sodium per serving.  Avoid foods with 300  mg of sodium or more per serving. Shopping  Look for lower-sodium products, often labeled as "low-sodium" or "no salt added."  Always check the sodium content even if foods are labeled as "unsalted" or "no salt added".  Buy fresh foods. ? Avoid canned foods and premade or frozen meals. ? Avoid canned, cured, or processed meats  Buy breads that have less than 80 mg of sodium per slice. Cooking  Eat more home-cooked food and less restaurant, buffet, and fast food.  Avoid adding salt when cooking. Use salt-free seasonings or  herbs instead of table salt or sea salt. Check with your health care provider or pharmacist before using salt substitutes.  Cook with plant-based oils, such as canola, sunflower, or olive oil. Meal planning  When eating at a restaurant, ask that your food be prepared with less salt or no salt, if possible.  Avoid foods that contain MSG (monosodium glutamate). MSG is sometimes added to [place] food, bouillon, and some canned foods. What foods are recommended? The items listed may not be a complete list. Talk with your dietitian about what dietary choices are best for you. Grains Low-sodium cereals, including oats, puffed wheat and rice, and shredded wheat. Low-sodium crackers. Unsalted rice. Unsalted pasta. Low-sodium bread. Whole-grain breads and whole-grain pasta. Vegetables Fresh or frozen vegetables. "No salt added" canned vegetables. "No salt added" tomato sauce and paste. Low-sodium or reduced-sodium tomato and vegetable juice. Fruits Fresh, frozen, or canned fruit. Fruit juice. Meats and other protein foods Fresh or frozen (no salt added) meat, poultry, seafood, and fish. Low-sodium canned tuna and salmon. Unsalted nuts. Dried peas, beans, and lentils without added salt. Unsalted canned beans. Eggs. Unsalted nut butters. Dairy Milk. Soy milk. Cheese that is naturally low in sodium, such as ricotta cheese, fresh mozzarella, or Swiss cheese Low-sodium or reduced-sodium cheese. Cream cheese. Yogurt. Fats and oils Unsalted butter. Unsalted margarine with no trans fat. Vegetable oils such as canola or olive oils. Seasonings and other foods Fresh and dried herbs and spices. Salt-free seasonings. Low-sodium mustard and ketchup. Sodium-free salad dressing. Sodium-free light mayonnaise. Fresh or refrigerated horseradish. Lemon juice. Vinegar. Homemade, reduced-sodium, or low-sodium soups. Unsalted popcorn and pretzels. Low-salt or salt-free chips. What foods are not recommended? The items  listed may not be a complete list. Talk with your dietitian about what dietary choices are best for you. Grains Instant hot cereals. Bread stuffing, pancake, and biscuit mixes. Croutons. Seasoned rice or pasta mixes. Noodle soup cups. Boxed or frozen macaroni and cheese. Regular salted crackers. Self-rising flour. Vegetables Sauerkraut, pickled vegetables, and relishes. Olives. [place] fries. Onion rings. Regular canned vegetables (not low-sodium or reduced-sodium). Regular canned tomato sauce and paste (not low-sodium or reduced-sodium). Regular tomato and vegetable juice (not low-sodium or reduced-sodium). Frozen vegetables in sauces. Meats and other protein foods Meat or fish that is salted, canned, smoked, spiced, or pickled. Bacon, ham, sausage, hotdogs, corned beef, chipped beef, packaged lunch meats, salt pork, jerky, pickled herring, anchovies, regular canned tuna, sardines, salted nuts. Dairy Processed cheese and cheese spreads. Cheese curds. Blue cheese. Feta cheese. String cheese. Regular cottage cheese. Buttermilk. Canned milk. Fats and oils Salted butter. Regular margarine. Ghee. Bacon fat. Seasonings and other foods Onion salt, garlic salt, seasoned salt, table salt, and sea salt. Canned and packaged gravies. Worcestershire sauce. Tartar sauce. Barbecue sauce. Teriyaki sauce. Soy sauce, including reduced-sodium. Steak sauce. Fish sauce. Oyster sauce. Cocktail sauce. Horseradish that you find on the shelf. Regular ketchup and mustard. Meat flavorings and tenderizers. Bouillon cubes.  Hot sauce and Tabasco sauce. Premade or packaged marinades. Premade or packaged taco seasonings. Relishes. Regular salad dressings. Salsa. Potato and tortilla chips. Corn chips and puffs. Salted popcorn and pretzels. Canned or dried soups. Pizza. Frozen entrees and pot pies. Summary  Eating less sodium can help lower your blood pressure, reduce swelling, and protect your heart, liver, and kidneys.  Most  people on this plan should limit their sodium intake to 1,500-2,000 mg (milligrams) of sodium each day.  Canned, boxed, and frozen foods are high in sodium. Restaurant foods, fast foods, and pizza are also very high in sodium. You also get sodium by adding salt to food.  Try to cook at home, eat more fresh fruits and vegetables, and eat less fast food, canned, processed, or prepared foods. This information is not intended to replace advice given to you by your health care provider. Make sure you discuss any questions you have with your health care provider. Document Revised: 04/25/2017 Document Reviewed: 05/06/2016 Elsevier Patient Education  2020 [employer].

## 2019-09-10 NOTE — Telephone Encounter (Signed)
-----   Message from Mady Haagensen, [place] sent at 09/10/2019 12:55 PM EDT ----- Regarding: Referral Victoria [name] this patient needs a referral to Dr. Radford Pax for sleep apnea. Do I put a referral in? Nicki Reaper said he may need a f/u sleep study before she sees her. If so just let me know what I need to put in. I am off this afternoon so I may need to put things in Monday.

## 2019-09-10 NOTE — Telephone Encounter (Signed)
Split night ordered 

## 2019-09-15 ENCOUNTER — Other Ambulatory Visit: Payer: 59

## 2019-09-24 MED FILL — ATORVASTATIN 20 MG TABLET: 20 | 30 days supply | Qty: 30 | Fill #2

## 2019-09-24 MED FILL — VIT D2 1.25 MG (50,000 UNIT: 1.25 MG | 84 days supply | Qty: 12 | Fill #0

## 2019-09-29 ENCOUNTER — Other Ambulatory Visit: Payer: Self-pay | Admitting: Family Medicine

## 2019-09-29 DIAGNOSIS — Z3169 Encounter for other general counseling and advice on procreation: Secondary | ICD-10-CM | POA: Diagnosis not present

## 2019-09-29 DIAGNOSIS — I5189 Other ill-defined heart diseases: Secondary | ICD-10-CM

## 2019-09-29 DIAGNOSIS — Z01419 Encounter for gynecological examination (general) (routine) without abnormal findings: Secondary | ICD-10-CM | POA: Diagnosis not present

## 2019-09-29 DIAGNOSIS — N760 Acute vaginitis: Secondary | ICD-10-CM | POA: Diagnosis not present

## 2019-09-29 DIAGNOSIS — Z130 Encounter for screening for diseases of the blood and blood-forming organs and certain disorders involving the immune mechanism: Secondary | ICD-10-CM | POA: Diagnosis not present

## 2019-09-29 DIAGNOSIS — E119 Type 2 diabetes mellitus without complications: Secondary | ICD-10-CM

## 2019-09-29 DIAGNOSIS — R82998 Other abnormal findings in urine: Secondary | ICD-10-CM | POA: Diagnosis not present

## 2019-09-29 DIAGNOSIS — Z1389 Encounter for screening for other disorder: Secondary | ICD-10-CM | POA: Diagnosis not present

## 2019-09-29 DIAGNOSIS — Z6841 Body Mass Index (BMI) 40.0 and over, adult: Secondary | ICD-10-CM | POA: Diagnosis not present

## 2019-09-29 DIAGNOSIS — I10 Essential (primary) hypertension: Secondary | ICD-10-CM

## 2019-09-29 MED FILL — FARXIGA 10 MG TABLET: 10 | 90 days supply | Qty: 90 | Fill #0

## 2019-09-29 MED FILL — metroNIDAZOLE 500 MG TABS: 500 | 7 days supply | Qty: 14 | Fill #0

## 2019-09-29 MED FILL — SPIRONOLACTONE 25 MG TABS: 25 | 90 days supply | Qty: 90 | Fill #0

## 2019-09-29 MED FILL — LOSARTAN POTASSIUM 100 MG T: 100 | 90 days supply | Qty: 90 | Fill #0

## 2019-09-29 MED FILL — TERCONAZOLE 0.8% VAGINAL CR: 0.8 | 3 days supply | Qty: 20 | Fill #0

## 2019-10-01 MED FILL — AMOXICILLIN 500 MG CAPSULE: 500 | 7 days supply | Qty: 14 | Fill #0

## 2019-10-22 MED FILL — CHLORTHALIDONE 25 MG TABS: 25 | 30 days supply | Qty: 30 | Fill #1

## 2019-10-27 ENCOUNTER — Telehealth: Payer: Self-pay | Admitting: *Deleted

## 2019-10-27 NOTE — Telephone Encounter (Signed)
Staff message sent to Gae Bon ok to schedule sleep study. Per Kia no PA is required by patient's insurance due to her having [facility].

## 2019-11-04 NOTE — Telephone Encounter (Signed)
RE: precert Lauralee Evener, CMA  Freada Bergeron, CMA Ok to schedule sleep study. No PA is required.

## 2019-11-04 NOTE — Telephone Encounter (Signed)
Patient is scheduled for lab study on 11/19/19. pt is scheduled for COVID screening on 6/23 2:30. prior to ss..  Patient understands her sleep study will be done at [facility] sleep lab. Patient understands she will receive a sleep packet in a week or so. Patient understands to call if she does not receive the sleep packet in a timely manner. Patient agrees with treatment and thanked me for call.

## 2019-11-17 ENCOUNTER — Other Ambulatory Visit (HOSPITAL_COMMUNITY)
Admission: RE | Admit: 2019-11-17 | Discharge: 2019-11-17 | Disposition: A | Discharge status: Home / Self Care | Payer: 59 | Source: Ambulatory Visit | Attending: Cardiology | Admitting: Cardiology

## 2019-11-17 DIAGNOSIS — Z01812 Encounter for preprocedural laboratory examination: Secondary | ICD-10-CM | POA: Insufficient documentation

## 2019-11-17 DIAGNOSIS — Z20822 Contact with and (suspected) exposure to covid-19: Secondary | ICD-10-CM | POA: Insufficient documentation

## 2019-11-17 LAB — SARS CORONAVIRUS 2 (TAT 6-24 HRS): SARS Coronavirus 2: NEGATIVE

## 2019-11-19 ENCOUNTER — Ambulatory Visit (HOSPITAL_BASED_OUTPATIENT_CLINIC_OR_DEPARTMENT_OTHER): Payer: 59 | Attending: Physician Assistant | Admitting: Cardiology

## 2019-11-19 ENCOUNTER — Other Ambulatory Visit: Payer: Self-pay

## 2019-11-19 DIAGNOSIS — R0902 Hypoxemia: Secondary | ICD-10-CM | POA: Insufficient documentation

## 2019-11-19 DIAGNOSIS — G4733 Obstructive sleep apnea (adult) (pediatric): Secondary | ICD-10-CM | POA: Insufficient documentation

## 2019-11-21 NOTE — Procedures (Signed)
    Patient Name: Victoria [name], Victoria [name] Date: 11/19/2019 Gender: Female D.O.B: 02/10/1981 Age (years): 38 Referring Provider: Richardson Dopp Height (inches): 2 Interpreting Physician: Fransico Him MD, ABSM Weight (lbs): 279 RPSGT: Baxter Flattery BMI: 48 MRN: [id number] Neck Size: 16.00  CLINICAL INFORMATION Sleep Study Type: NPSG  Indication for sleep study: Obesity, Snoring, Witnesses Apnea / Gasping During Sleep  Epworth Sleepiness Score: 13  SLEEP STUDY TECHNIQUE As per the AASM Manual for the Scoring of Sleep and Associated Events v2.3 (April 2016) with a hypopnea requiring 4% desaturations.  The channels recorded and monitored were frontal, central and occipital EEG, electrooculogram (EOG), submentalis EMG (chin), nasal and oral airflow, thoracic and abdominal wall motion, anterior tibialis EMG, snore microphone, electrocardiogram, and pulse oximetry.  MEDICATIONS Medications self-administered by patient taken the night of the study : N/A  SLEEP ARCHITECTURE The study was initiated at 10:53:44 PM and ended at 5:24:24 AM.  Sleep onset time was 7.4 minutes and the sleep efficiency was 77.7%. The total sleep time was 303.7 minutes.  Stage REM latency was 176.5 minutes.  The patient spent 2.5% of the night in stage N1 sleep, 85.0% in stage N2 sleep, 0.0% in stage N3 and 12.5% in REM.  Alpha intrusion was absent.  Supine sleep was 100.00%.  RESPIRATORY PARAMETERS The overall apnea/hypopnea index (AHI) was 25.3 per hour. There were 0 total apneas, including 0 obstructive, 0 central and 0 mixed apneas. There were 128 hypopneas and 0 RERAs.  The AHI during Stage REM sleep was 53.7 per hour.  AHI while supine was 25.3 per hour.  The mean oxygen saturation was 95.0%. The minimum SpO2 during sleep was 78.0%.  moderate snoring was noted during this study.  CARDIAC DATA The 2 lead EKG demonstrated sinus rhythm. The mean heart rate was 80.1 beats per minute. Other EKG  findings include: None  LEG MOVEMENT DATA The total PLMS were 0 with a resulting PLMS index of 0.0. Associated arousal with leg movement index was 0.0 .  IMPRESSIONS - Moderate obstructive sleep apnea occurred during this study (AHI = 25.3/h). - No significant central sleep apnea occurred during this study (CAI = 0.0/h). - Moderate oxygen desaturation was noted during this study (Min O2 = 78.0%). - The patient snored with moderate snoring volume. - No cardiac abnormalities were noted during this study. - Clinically significant periodic limb movements did not occur during sleep. No significant associated arousals.  DIAGNOSIS - Obstructive Sleep Apnea (327.23 [G47.33 ICD-10]) - Nocturnal Hypoxemia (327.26 [G47.36 ICD-10])  RECOMMENDATIONS - Therapeutic CPAP titration to determine optimal pressure required to alleviate sleep disordered breathing. - Positional therapy avoiding supine position during sleep. - Avoid alcohol, sedatives and other CNS depressants that may worsen sleep apnea and disrupt normal sleep architecture. - Sleep hygiene should be reviewed to assess factors that may improve sleep quality. - Weight management and regular exercise should be initiated or continued if appropriate.  [Electronically signed] 11/21/2019 08:37 PM  Fransico Him MD, ABSM Diplomate, American Board of Sleep Medicine

## 2019-11-22 ENCOUNTER — Telehealth: Payer: Self-pay | Admitting: *Deleted

## 2019-11-22 DIAGNOSIS — G4733 Obstructive sleep apnea (adult) (pediatric): Secondary | ICD-10-CM

## 2019-11-22 NOTE — Telephone Encounter (Signed)
Staff message sent to Victoria [name] ok to schedule titration study. No PA is required.

## 2019-11-22 NOTE — Telephone Encounter (Signed)
Informed patient of sleep study results and patient understanding was verbalized. Patient understands her sleep study showed they have sleep apnea and recommend CPAP titration. Please set up titration in the sleep lab.  Pt is aware and agreeable to her results.  cpap titration sent to sleep pool. 

## 2019-11-22 NOTE — Telephone Encounter (Signed)
-----   Message from Sueanne Margarita, MD sent at 11/21/2019  8:40 PM EDT ----- Please let patient know that they have sleep apnea and recommend CPAP titration. Please set up titration in the sleep lab.

## 2019-11-24 NOTE — Addendum Note (Signed)
Addended by: Freada Bergeron on: 11/24/2019 08:41 AM   Modules accepted: Orders

## 2019-11-24 NOTE — Telephone Encounter (Signed)
Lauralee Evener, CMA  Freada Bergeron, CMA Ok to schedule. Patient does not need a PA.

## 2019-11-24 NOTE — Telephone Encounter (Signed)
Patient is scheduled for CPAP Titration on 12/16/19. Patient understands her titration study will be done at [facility] sleep lab. Patient understands she will receive a letter in a week or so detailing appointment, date, time, and location. Patient understands to call if she does not receive the letter  in a timely manner.  Left detailed message on voicemail with date and time of titration and informed patient to call back to confirm or reschedule.

## 2019-12-06 ENCOUNTER — Other Ambulatory Visit: Payer: Self-pay | Admitting: Family Medicine

## 2019-12-06 DIAGNOSIS — E119 Type 2 diabetes mellitus without complications: Secondary | ICD-10-CM

## 2019-12-06 MED FILL — ATORVASTATIN 20 MG TABLET: 20 | 30 days supply | Qty: 30 | Fill #0

## 2019-12-06 MED FILL — CHLORTHALIDONE 25 MG TABS: 25 | 30 days supply | Qty: 30 | Fill #2

## 2019-12-06 NOTE — Telephone Encounter (Signed)
She is past due for labs and is scheduled to see me 7/14.  Ensure that she hasn't completely run out, and make sure she comes to her visit fasting (visit is 11:30a)

## 2019-12-07 NOTE — Progress Notes (Signed)
Chief Complaint  Patient presents with  . other    Fasting med check , a1c done    Patient presents for follow up on chronic problems.  Diabetes:She reports compliance in taking Farxiga and metformin (never took Januvia that had been prescribed at one point, as her sugars improved with change in diet). She denies side effects to her medications.   She has noted her sugars have been a little higher since chlorthalidone was added.  Sugars are running 160's fasting, up to 200 later in the day. She hasn't been exercising.  She is only drinking diet soda. +apples, pears, bananas, and a LOT of grapes daily. Eating fruity yogurts.  She denies hypoglycemia, polydipsia, polyuria. Diabetic eye exam was 10/2018, is scheduled for August  Hasn't checked sugars in a week--son washed her meter and it doesn't work. Plans to get a new one. . Lab Results  Component Value Date   HGBA1C 6.4 (A) 08/25/2019   Vitamin D deficiency:  Last level was 18.4 in 07/2019. At that time she was taking MVI daily, but no separate D supplement. She was treated with 12 weeks of prescription, and was advised to start 2000 IU daily upon completion.  She is currently taking only MVI, never started taking D3, but only recently completed the prescription course.  Hyperlipidemia: She restarted atorvastatin in February and denies side effects.  She is due for fasting labs. She has seen Dr. Willis Modena since her last visit here (when had been using condoms for contraception).  She reports that she wants another child sometime soon, so didn't get another IUD put in. In discussing plans further, she states that her husband doesn't want another for a few years. Still using condoms  Last seen by cardiologist in 08/2019 for f/u pericarditis and hypertension. BP's had been very high.  She continues on losartan and spironolactone (had hypokalemia from HCTZ in the past). She didn't tolerate amlodipine or nifedipine.  Chlorthalidone 15m  daily was added at that visit.  She was to have f/u b-met and f/u visit in 2-3 weeks. She no-showed the lab visit and didn't have a follow-up visit. She has infrequent muscle cramps, not often.  Is eating bananas. BP's have been 127/86, nothing 140 or higher.  Max 138/90.  States today she has a headache (and has had some more headaches since not able to use CPAP)  Clonidine or labetalol were mentioned as next possible steps, if she didn't tolerate chlorthalidone, in order to get BP to goal, and mentioned possibly referring to hypertension clinic with Dr. ROval Linseyif unable to get good control.  BP's have been better, as reported above.  It was suggested (by cardiology PA) that she have another sleep study. This was done in June and showed: IMPRESSIONS - Moderate obstructive sleep apnea occurred during this study (AHI = 25.3/h). - No significant central sleep apnea occurred during this study (CAI = 0.0/h). - Moderate oxygen desaturation was noted during this study (Min O2 = 78.0%). - The patient snored with moderate snoring volume. - No cardiac abnormalities were noted during this study. - Clinically significant periodic limb movements did not occur during sleep. No significant associated arousals.  DIAGNOSIS - Obstructive Sleep Apnea (327.23 [G47.33 ICD-10]) - Nocturnal Hypoxemia (327.26 [G47.36 ICD-10])  She is scheduled for CPAP titration study 7/22.  Her machine hadn't been working right, so she isn't currently using CPAP while waiting on this.    PMH, PSH, SH reviewed  Outpatient Encounter Medications as of 12/08/2019  Medication Sig Note  . acetaminophen (TYLENOL) 325 MG tablet Take 650 mg by mouth every 6 (six) hours as needed.   Marland Kitchen albuterol (PROVENTIL) (5 MG/ML) 0.5% nebulizer solution Take 0.5 mLs (2.5 mg total) by nebulization every 6 (six) hours as needed for wheezing or shortness of breath.   . APPLE CIDER VINEGAR PO Take 2 each by mouth 2 (two) times daily. 05/26/2019: goli   . atorvastatin (LIPITOR) 20 MG tablet TAKE 1 TABLET (20 MG TOTAL) BY MOUTH DAILY.   . chlorthalidone (HYGROTON) 25 MG tablet Take 1 tablet (25 mg total) by mouth daily.   . dapagliflozin propanediol (FARXIGA) 10 MG TABS tablet Take 1 tablet (10 mg total) by mouth daily.   Marland Kitchen glucose blood (FREESTYLE LITE) test strip USE AS DIRECTED 2 (TWO) TIMES DAILY   . ibuprofen (ADVIL) 800 MG tablet Take 1 tablet (800 mg total) by mouth 3 (three) times daily as needed.   Marland Kitchen losartan (COZAAR) 100 MG tablet Take 1 tablet (100 mg total) by mouth daily.   . metFORMIN (GLUCOPHAGE) 1000 MG tablet TAKE 1 TABLET (1,000 MG TOTAL) BY MOUTH TWICE A DAY WITH A MEAL.   . Multiple Vitamins-Minerals (WOMENS DAILY FORMULA PO) Take 1 tablet by mouth daily.   Marland Kitchen spironolactone (ALDACTONE) 25 MG tablet Take 1 tablet (25 mg total) by mouth daily.   . [DISCONTINUED] FARXIGA 10 MG TABS tablet TAKE 1 TABLET BY MOUTH ONCE DAILY   . [DISCONTINUED] losartan (COZAAR) 100 MG tablet TAKE 1 TABLET (100 MG TOTAL) BY MOUTH DAILY.   . [DISCONTINUED] spironolactone (ALDACTONE) 25 MG tablet TAKE 1 TABLET BY MOUTH DAILY.   Marland Kitchen amoxicillin (AMOXIL) 500 MG capsule Take 500 mg by mouth 3 (three) times daily. (Patient not taking: Reported on 12/08/2019) 08/25/2019: Had tooth pulled, rx'd by dentist  . sitaGLIPtin (JANUVIA) 50 MG tablet Take 1 tablet (50 mg total) by mouth daily.   . Vitamin D, Ergocalciferol, (DRISDOL) 1.25 MG (50000 UNIT) CAPS capsule Take 1 capsule (50,000 Units total) by mouth every 7 (seven) days. (Patient not taking: Reported on 12/08/2019)    No facility-administered encounter medications on file as of 12/08/2019.   NOT taking januvia prior to this visit  Allergies  Allergen Reactions  . Dilaudid [Hydromorphone Hcl] Hives  . Morphine And Related Hives  . Peanut-Containing Drug Products Hives  . Strawberry Extract Swelling    Swelling is of the eye.    ROS: no fever, chills, URI symptoms, cough, shortness of breath. No  dizziness, chest pain, palpitations. No nausea, vomiting, bowel changes No urinary complaints. Rare mild muscle cramps, no edema Moods are good. Headache today, has been having more headaches since not using CPAP for OSA> Some allergies, congestion and itchy ears, controlled by meds (except the itching). +weight gain noted   PHYSICAL EXAM:  BP (!) 146/108   Pulse 81   Temp 98 F (36.7 C)   Ht 5' 3.75" (1.619 m)   Wt 286 lb 12.8 oz (130.1 kg)   SpO2 97%   BMI 49.62 kg/m   BP 160/110 on repeat by MD  Wt Readings from Last 3 Encounters:  12/08/19 286 lb 12.8 oz (130.1 kg)  11/19/19 279 lb (126.6 kg)  09/10/19 282 lb (127.9 kg)    Pleasant, obese, well-appearing female, in good spirits, in no distress HEENT conjunctiva and sclera are clear, EOMI, wearing mask Neck: no lymphadenopathy, thyromegaly or carotid bruit Heart: regular rate and rhythm, no murmur or ectopy Lungs: clear bilaterally, no wheezes,  rales, ronchi Abdomen: obese, soft, nontender, no mass Back: no CVA or spinal tenderness Extremities: no edema, 2+ pulses. Normal diabetic foot exam Neuro: alert and oriented, normal gait. Normal sensation to monofilament Psych: normal mood, affect, hygiene and grooming  Lab Results  Component Value Date   HGBA1C 8.0 (A) 12/08/2019    ASSESSMENT/PLAN:  Diabetes mellitus type 2 with complications ([place]) - Plan: POCT glycosylated hemoglobin (Hb A1C), Comprehensive metabolic panel  Vitamin D deficiency - recently completed Rx, not taking OTC yet. Recheck level to see if additional Rx needed - Plan: VITAMIN D 25 Hydroxy (Vit-D Deficiency, Fractures)  Hypertension associated with diabetes ([place])  OSA (obstructive sleep apnea) - f/u as scheduled for CPAP titration.  Currently symptomatic from OSA (headaches, higher BP, more fatigued)  Medication monitoring encounter - Plan: Comprehensive metabolic panel, Lipid panel, VITAMIN D 25 Hydroxy (Vit-D Deficiency,  Fractures)  Hyperlipidemia associated with type 2 diabetes mellitus ([place]) - due for recheck, on atorvastatin - Plan: Lipid panel  Diastolic dysfunction - BP's better at home than here.  Need to get OSA treated.  Reminded to f/u with cardiologist - Plan: spironolactone (ALDACTONE) 25 MG tablet  Essential hypertension - very high today, despite taking meds. Has HA, poss from OSA. BP's reportedly better at home. Cont current meds. Check K+ - Plan: losartan (COZAAR) 100 MG tablet, spironolactone (ALDACTONE) 25 MG tablet  Type 2 diabetes mellitus not at goal [facility]) - Not at goal, lots more fruit, lack of exercise, weight gain. To start Januvia, cut back on fruit, wt loss/exercise - Plan: dapagliflozin propanediol (FARXIGA) 10 MG TABS tablet, sitaGLIPtin (JANUVIA) 50 MG tablet   Lipids, c-met, D  F/u as scheduled for CPAP titration study.  [place] written 12/2018 #30 2 refill, but refills will expire soon.  To start the bottle she has at home.  Refills sent.  F/u 3 months

## 2019-12-08 ENCOUNTER — Other Ambulatory Visit: Payer: Self-pay | Admitting: Family Medicine

## 2019-12-08 ENCOUNTER — Ambulatory Visit: Payer: 59 | Admitting: Family Medicine

## 2019-12-08 ENCOUNTER — Encounter: Payer: Self-pay | Admitting: Family Medicine

## 2019-12-08 ENCOUNTER — Other Ambulatory Visit: Payer: Self-pay

## 2019-12-08 VITALS — BP 146/108 | HR 81 | Temp 98.0°F | Ht 63.75 in | Wt 286.8 lb

## 2019-12-08 DIAGNOSIS — Z5181 Encounter for therapeutic drug level monitoring: Secondary | ICD-10-CM

## 2019-12-08 DIAGNOSIS — E559 Vitamin D deficiency, unspecified: Secondary | ICD-10-CM | POA: Diagnosis not present

## 2019-12-08 DIAGNOSIS — I10 Essential (primary) hypertension: Secondary | ICD-10-CM | POA: Diagnosis not present

## 2019-12-08 DIAGNOSIS — G4733 Obstructive sleep apnea (adult) (pediatric): Secondary | ICD-10-CM

## 2019-12-08 DIAGNOSIS — E785 Hyperlipidemia, unspecified: Secondary | ICD-10-CM | POA: Diagnosis not present

## 2019-12-08 DIAGNOSIS — E118 Type 2 diabetes mellitus with unspecified complications: Secondary | ICD-10-CM | POA: Diagnosis not present

## 2019-12-08 DIAGNOSIS — E1169 Type 2 diabetes mellitus with other specified complication: Secondary | ICD-10-CM

## 2019-12-08 DIAGNOSIS — E119 Type 2 diabetes mellitus without complications: Secondary | ICD-10-CM | POA: Diagnosis not present

## 2019-12-08 DIAGNOSIS — I5189 Other ill-defined heart diseases: Secondary | ICD-10-CM

## 2019-12-08 DIAGNOSIS — E1159 Type 2 diabetes mellitus with other circulatory complications: Secondary | ICD-10-CM

## 2019-12-08 DIAGNOSIS — I152 Hypertension secondary to endocrine disorders: Secondary | ICD-10-CM

## 2019-12-08 LAB — POCT GLYCOSYLATED HEMOGLOBIN (HGB A1C): Hemoglobin A1C: 8 % — AB (ref 4.0–5.6)

## 2019-12-08 MED ORDER — DAPAGLIFLOZIN PROPANEDIOL 10 MG PO TABS: 10.0000 mg | ORAL_TABLET | Freq: Every day | ORAL | 1 refills | Status: DC

## 2019-12-08 MED ORDER — SITAGLIPTIN PHOSPHATE 50 MG PO TABS: 50.0000 mg | ORAL_TABLET | Freq: Every day | ORAL | 2 refills | Status: DC

## 2019-12-08 MED ORDER — LOSARTAN POTASSIUM 100 MG PO TABS: 100.0000 mg | ORAL_TABLET | Freq: Every day | ORAL | 1 refills | Status: DC

## 2019-12-08 MED ORDER — SPIRONOLACTONE 25 MG PO TABS: 25.0000 mg | ORAL_TABLET | Freq: Every day | ORAL | 1 refills | Status: DC

## 2019-12-08 MED FILL — JANUVIA 50 MG TABLET: 50 | 30 days supply | Qty: 30 | Fill #0

## 2019-12-08 NOTE — Patient Instructions (Addendum)
Buy the D3 2000 IU to have at home for when we tell you to start it. If your level is very low today, we might extend and give you another course of prescription. Once you finish the prescription (or if we say one isn't needed based on the results) you should start taking 2000 IU of D3 daily, and continue this longterm.  You must continue to use condoms while taking losartan and atorvastatin.  If there is any chance of pregnancy, you need to stop both of these medications. Since you indicating waiting a few years for another child, it is worth looking into either another IUD or implants in the interim for better contraception.  Cut back on your fruit intake (2 servings is okay, but eating too much is a lot of sugar). Get back to daily exercise to help with sugars.  Start the Januvia that you have at home.  This was written a while ago, you found the bottle at home stating it was full and you never started it.  NOW IS THE TIME TO START IT. Take it once daily. Continue to monitor your sugars and bring your list to your 3 month visit.  Schedule a follow-up with the cardiologist. Get the CPAP titration study as scheduled. Get your eye exam as scheduled in August.

## 2019-12-09 LAB — COMPREHENSIVE METABOLIC PANEL
ALT: 14 IU/L (ref 0–32)
AST: 14 IU/L (ref 0–40)
Albumin/Globulin Ratio: 1.3 (ref 1.2–2.2)
Albumin: 3.9 g/dL (ref 3.8–4.8)
Alkaline Phosphatase: 72 IU/L (ref 48–121)
BUN/Creatinine Ratio: 15 (ref 9–23)
BUN: 11 mg/dL (ref 6–20)
Bilirubin Total: 0.3 mg/dL (ref 0.0–1.2)
CO2: 21 mmol/L (ref 20–29)
Calcium: 9.7 mg/dL (ref 8.7–10.2)
Chloride: 105 mmol/L (ref 96–106)
Creatinine, Ser: 0.73 mg/dL (ref 0.57–1.00)
GFR calc Af Amer: 123 mL/min/{1.73_m2} (ref >59)
GFR calc non Af Amer: 106 mL/min/{1.73_m2} (ref >59)
Globulin, Total: 3 g/dL (ref 1.5–4.5)
Glucose: 114 mg/dL — ABNORMAL HIGH (ref 65–99)
Potassium: 4.1 mmol/L (ref 3.5–5.2)
Sodium: 139 mmol/L (ref 134–144)
Total Protein: 6.9 g/dL (ref 6.0–8.5)

## 2019-12-09 LAB — LIPID PANEL
Chol/HDL Ratio: 3 ratio (ref 0.0–4.4)
Cholesterol, Total: 149 mg/dL (ref 100–199)
HDL: 49 mg/dL (ref >39)
LDL Chol Calc (NIH): 81 mg/dL (ref 0–99)
Triglycerides: 105 mg/dL (ref 0–149)
VLDL Cholesterol Cal: 19 mg/dL (ref 5–40)

## 2019-12-09 LAB — VITAMIN D 25 HYDROXY (VIT D DEFICIENCY, FRACTURES): Vit D, 25-Hydroxy: 29.6 ng/mL — ABNORMAL LOW (ref 30.0–100.0)

## 2019-12-10 MED FILL — SPIRONOLACTONE 25 MG TABS: 25 | 90 days supply | Qty: 90 | Fill #0

## 2019-12-10 MED FILL — LOSARTAN POTASSIUM 100 MG T: 100 | 90 days supply | Qty: 90 | Fill #0

## 2019-12-10 MED FILL — FARXIGA 10 MG TABLET: 10 | 90 days supply | Qty: 90 | Fill #0

## 2019-12-16 ENCOUNTER — Ambulatory Visit (HOSPITAL_BASED_OUTPATIENT_CLINIC_OR_DEPARTMENT_OTHER): Payer: 59 | Admitting: Cardiology

## 2019-12-18 ENCOUNTER — Telehealth: Payer: 59 | Admitting: Family

## 2019-12-18 DIAGNOSIS — J019 Acute sinusitis, unspecified: Secondary | ICD-10-CM | POA: Diagnosis not present

## 2019-12-18 MED ORDER — AMOXICILLIN-POT CLAVULANATE 875-125 MG PO TABS: 1.0000 | ORAL_TABLET | Freq: Two times a day (BID) | ORAL | 0 refills | Status: DC

## 2019-12-18 NOTE — Progress Notes (Signed)
We are sorry that you are not feeling well.  Here is how we plan to help!  Based on what you have shared with me it looks like you have sinusitis.  Sinusitis is inflammation and infection in the sinus cavities of the head.  Based on your presentation I believe you most likely have Acute Bacterial Sinusitis.  This is an infection caused by bacteria and is treated with antibiotics. I have prescribed Augmentin 875mg/125mg one tablet twice daily with food, for 7 days. You may use an oral decongestant such as Mucinex D or if you have glaucoma or high blood pressure use plain Mucinex. Saline nasal spray help and can safely be used as often as needed for congestion.  If you develop worsening sinus pain, fever or notice severe headache and vision changes, or if symptoms are not better after completion of antibiotic, please schedule an appointment with a health care provider.    Sinus infections are not as easily transmitted as other respiratory infection, however we still recommend that you avoid close contact with loved ones, especially the very young and elderly.  Remember to wash your hands thoroughly throughout the day as this is the number one way to prevent the spread of infection!  Home Care:  Only take medications as instructed by your medical team.  Complete the entire course of an antibiotic.  Do not take these medications with alcohol.  A steam or ultrasonic humidifier can help congestion.  You can place a towel over your head and breathe in the steam from hot water coming from a faucet.  Avoid close contacts especially the very young and the elderly.  Cover your mouth when you cough or sneeze.  Always remember to wash your hands.  Get Help Right Away If:  You develop worsening fever or sinus pain.  You develop a severe head ache or visual changes.  Your symptoms persist after you have completed your treatment plan.  Make sure you  Understand these instructions.  Will watch your  condition.  Will get help right away if you are not doing well or get worse.  Your e-visit answers were reviewed by a board certified advanced clinical practitioner to complete your personal care plan.  Depending on the condition, your plan could have included both over the counter or prescription medications.  If there is a problem please reply  once you have received a response from your provider.  Your safety is important to us.  If you have drug allergies check your prescription carefully.    You can use MyChart to ask questions about today's visit, request a non-urgent call back, or ask for a work or school excuse for 24 hours related to this e-Visit. If it has been greater than 24 hours you will need to follow up with your provider, or enter a new e-Visit to address those concerns.  You will get an e-mail in the next two days asking about your experience.  I hope that your e-visit has been valuable and will speed your recovery. Thank you for using e-visits.  Approximately 5 minutes was spent documenting and reviewing patient's chart.    

## 2020-01-05 DIAGNOSIS — N760 Acute vaginitis: Secondary | ICD-10-CM | POA: Diagnosis not present

## 2020-01-05 MED FILL — metroNIDAZOLE 500 MG TABS: 500 | 7 days supply | Qty: 14 | Fill #0

## 2020-01-12 ENCOUNTER — Ambulatory Visit (HOSPITAL_BASED_OUTPATIENT_CLINIC_OR_DEPARTMENT_OTHER): Payer: 59 | Admitting: Cardiology

## 2020-02-03 ENCOUNTER — Encounter (HOSPITAL_BASED_OUTPATIENT_CLINIC_OR_DEPARTMENT_OTHER): Payer: 59 | Admitting: Cardiology

## 2020-02-20 ENCOUNTER — Emergency Department (HOSPITAL_COMMUNITY)
Admission: EM | Admit: 2020-02-20 | Discharge: 2020-02-21 | Disposition: A | Discharge status: Home / Self Care | Payer: 59 | Attending: Emergency Medicine | Admitting: Emergency Medicine

## 2020-02-20 ENCOUNTER — Other Ambulatory Visit: Payer: Self-pay

## 2020-02-20 ENCOUNTER — Encounter (HOSPITAL_COMMUNITY): Payer: Self-pay | Admitting: Emergency Medicine

## 2020-02-20 DIAGNOSIS — R519 Headache, unspecified: Secondary | ICD-10-CM | POA: Diagnosis not present

## 2020-02-20 DIAGNOSIS — I10 Essential (primary) hypertension: Secondary | ICD-10-CM | POA: Insufficient documentation

## 2020-02-20 DIAGNOSIS — J45909 Unspecified asthma, uncomplicated: Secondary | ICD-10-CM | POA: Insufficient documentation

## 2020-02-20 DIAGNOSIS — Z7951 Long term (current) use of inhaled steroids: Secondary | ICD-10-CM | POA: Diagnosis not present

## 2020-02-20 DIAGNOSIS — Z79899 Other long term (current) drug therapy: Secondary | ICD-10-CM | POA: Insufficient documentation

## 2020-02-20 DIAGNOSIS — R030 Elevated blood-pressure reading, without diagnosis of hypertension: Secondary | ICD-10-CM

## 2020-02-20 LAB — I-STAT BETA HCG BLOOD, ED (MC, WL, AP ONLY): I-stat hCG, quantitative: <5 m[IU]/mL (ref <5)

## 2020-02-20 LAB — BASIC METABOLIC PANEL
Anion gap: 11 (ref 5–15)
BUN: 11 mg/dL (ref 6–20)
CO2: 21 mmol/L — ABNORMAL LOW (ref 22–32)
Calcium: 9.3 mg/dL (ref 8.9–10.3)
Chloride: 106 mmol/L (ref 98–111)
Creatinine, Ser: 0.78 mg/dL (ref 0.44–1.00)
GFR calc Af Amer: >60 mL/min (ref >60)
GFR calc non Af Amer: >60 mL/min (ref >60)
Glucose, Bld: 251 mg/dL — ABNORMAL HIGH (ref 70–99)
Potassium: 3.5 mmol/L (ref 3.5–5.1)
Sodium: 138 mmol/L (ref 135–145)

## 2020-02-20 LAB — CBC
HCT: 35.5 % — ABNORMAL LOW (ref 36.0–46.0)
Hemoglobin: 11.3 g/dL — ABNORMAL LOW (ref 12.0–15.0)
MCH: 25.4 pg — ABNORMAL LOW (ref 26.0–34.0)
MCHC: 31.8 g/dL (ref 30.0–36.0)
MCV: 79.8 fL — ABNORMAL LOW (ref 80.0–100.0)
Platelets: 311 10*3/uL (ref 150–400)
RBC: 4.45 MIL/uL (ref 3.87–5.11)
RDW: 13.4 % (ref 11.5–15.5)
WBC: 6.8 10*3/uL (ref 4.0–10.5)
nRBC: 0 % (ref 0.0–0.2)

## 2020-02-20 MED ORDER — SODIUM CHLORIDE 0.9% FLUSH: 3.0000 mL | Freq: Once | INTRAVENOUS | Status: DC

## 2020-02-20 NOTE — ED Triage Notes (Signed)
Pt reports dizziness since last night.  Headache today.  BP at home 169/120.  Took Tylenol with some improvement of headache.  No arm drift.  Denies numbness/weakness.

## 2020-02-21 DIAGNOSIS — I10 Essential (primary) hypertension: Secondary | ICD-10-CM | POA: Diagnosis not present

## 2020-02-21 DIAGNOSIS — J45909 Unspecified asthma, uncomplicated: Secondary | ICD-10-CM | POA: Diagnosis not present

## 2020-02-21 DIAGNOSIS — R519 Headache, unspecified: Secondary | ICD-10-CM | POA: Diagnosis not present

## 2020-02-21 DIAGNOSIS — Z7951 Long term (current) use of inhaled steroids: Secondary | ICD-10-CM | POA: Diagnosis not present

## 2020-02-21 DIAGNOSIS — Z79899 Other long term (current) drug therapy: Secondary | ICD-10-CM | POA: Diagnosis not present

## 2020-02-21 MED ORDER — SODIUM CHLORIDE 0.9 % IV BOLUS
1000.0000 mL | Freq: Once | INTRAVENOUS | Status: AC
  Administered 2020-02-21: 1000 mL via INTRAVENOUS

## 2020-02-21 MED ORDER — LABETALOL HCL 5 MG/ML IV SOLN
20.0000 mg | Freq: Once | INTRAVENOUS | Status: AC
  Administered 2020-02-21: 20 mg via INTRAVENOUS
  Filled 2020-02-21: qty 4

## 2020-02-21 MED ORDER — DIPHENHYDRAMINE HCL 50 MG/ML IJ SOLN
25.0000 mg | Freq: Once | INTRAMUSCULAR | Status: AC
  Administered 2020-02-21: 25 mg via INTRAVENOUS
  Filled 2020-02-21: qty 1

## 2020-02-21 MED ORDER — METOCLOPRAMIDE HCL 5 MG/ML IJ SOLN
10.0000 mg | Freq: Once | INTRAMUSCULAR | Status: AC
  Administered 2020-02-21: 10 mg via INTRAVENOUS
  Filled 2020-02-21: qty 2

## 2020-02-21 MED ORDER — KETOROLAC TROMETHAMINE 30 MG/ML IJ SOLN
30.0000 mg | Freq: Once | INTRAMUSCULAR | Status: AC
  Administered 2020-02-21: 30 mg via INTRAVENOUS
  Filled 2020-02-21: qty 1
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
## 2020-02-21 NOTE — ED Notes (Signed)
C/o dizziness and headache onset sat pm while at work, c/o vision being hazy at times. States she takes her blood pressure medication at directed.

## 2020-02-21 NOTE — Discharge Instructions (Addendum)
Resume taking your normal meds.  If your symptoms return, change, or worsen, return to the ER or be seen by your doctor.

## 2020-02-22 ENCOUNTER — Other Ambulatory Visit: Payer: Self-pay | Admitting: Family Medicine

## 2020-02-22 DIAGNOSIS — E119 Type 2 diabetes mellitus without complications: Secondary | ICD-10-CM

## 2020-02-22 MED FILL — JANUVIA 50 MG TABLET: 50 | 30 days supply | Qty: 30 | Fill #0

## 2020-02-22 MED FILL — ATORVASTATIN 20 MG TABLET: 20 | 30 days supply | Qty: 30 | Fill #0

## 2020-03-03 MED FILL — ATORVASTATIN 20 MG TABLET: 20 | 30 days supply | Qty: 30 | Fill #0

## 2020-03-03 MED FILL — JANUVIA 50 MG TABLET: 50 | 30 days supply | Qty: 30 | Fill #0

## 2020-03-06 ENCOUNTER — Ambulatory Visit (HOSPITAL_BASED_OUTPATIENT_CLINIC_OR_DEPARTMENT_OTHER): Payer: 59 | Attending: Cardiology | Admitting: Cardiology

## 2020-03-06 ENCOUNTER — Other Ambulatory Visit: Payer: Self-pay

## 2020-03-06 DIAGNOSIS — G4733 Obstructive sleep apnea (adult) (pediatric): Secondary | ICD-10-CM | POA: Insufficient documentation

## 2020-03-08 NOTE — Progress Notes (Deleted)
Patient presents for 3 month follow-up.  Diabetes:  A1c was above goal on last check (8%), and Januvia was added to [place] and metformin. She denies side effects to her medications.    Sugars are running  At her last visit she was eating a large amount of fruit, hadn't been exercising.  Only drinking diet soda. UPDATE DIET   She denies hypoglycemia, polydipsia, polyuria. Diabetic eye exam was 10/2018, was scheduled for August--DID SHE GET?  Lab Results  Component Value Date   HGBA1C 8.0 (A) 12/08/2019   Vitamin D deficiency:  Last level was 29.6 in 11/2019, shortly after completing her 12 week course of prescription D, and still taking MVI (hadn't started D3 2000 IU yet, as had been recommended).  Previously it had been 18.4 in 07/2019, when only taking MVI daily. She is currently taking  Hyperlipidemia:  She restarted atorvastatin in February and denies side effects.  Lipids in July were at goal. Still using condoms for contraception, and aware of the potential risks of statins and pregnancy.     Lab Results  Component Value Date   CHOL 149 12/08/2019   HDL 49 12/08/2019   LDLCALC 81 12/08/2019   TRIG 105 12/08/2019   CHOLHDL 3.0 12/08/2019   Hypertension:  She continues on losartan, spironolactone, and chlorthalidone 25mg  (which was added to her regimen by cardiologist in 08/2019).  She previously had hypokalemia from HCTZ in past, not on aldactone at the time. She couldn't tolerate amlodipine or nifedipine.   At her last visit, BP was high, hadn't been using CPAP for her OSA.  BP's have been running. She is eating bananas, has only infrequent muscle cramps. She had ER visit last month for headache. BP was very high (185/115), treated with labetalol and "headache cocktail" Had chem done in ER, K+ was 3.5  She hasn't seen cardiologist in f/u yet, and has no schedule appt.  They previously mentioned Clonidine or labetalol as next possible steps if she didn't tolerate  chlorthalidone, in order to get BP to goal, and possibly referring to hypertension clinic if unable to get good control.    OSA:  She had sleep study in June, showing moderate OSA, moderate oxygen desaturation. She had been scheduled for CPAP titration in July, but it looks like that wasn't actually done until this week. At her last visit, her machine hadn't been working right, wasn't using CPAP, and had been having more headaches.   PMH, PSH, SH reviewed   ROS: no fever, chills, URI symptoms, cough, shortness of breath. No dizziness, chest pain, palpitations. No nausea, vomiting, bowel changes No urinary complaints. Rare mild muscle cramps, no edema Moods are good. Headache    PHYSICAL EXAM:  Wt Readings from Last 3 Encounters:  03/06/20 288 lb (130.6 kg)  12/08/19 286 lb 12.8 oz (130.1 kg)  11/19/19 279 lb (126.6 kg)   Pleasant, obese, well-appearing female, in good spirits, in no distress HEENT conjunctiva and sclera are clear, EOMI, wearing mask Neck: no lymphadenopathy, thyromegaly or carotid bruit Heart: regular rate and rhythm, no murmur or ectopy Lungs: clear bilaterally, no wheezes, rales, ronchi Abdomen: obese, soft, nontender, no mass Back: no CVA or spinal tenderness Extremities: no edema, 2+ pulses. Neuro: alert and oriented, normal gait.  Psych: normal mood, affect, hygiene and grooming   ASSESSMENT/PLAN:  A1c Flu shot Enter COVID vaccine dates  DID SHE EVER GET DIABETIC EYE EXAM? IF SO, CALL AND GET REPORT PLEASE If not, she needs to reschedule!  Has she been using CPAP? (had titration study earlier this week, finally)  Ensure taking D3 (2000 IU rec) Recheck D only if not taking  Schedule fu Has never had CPE (canceled) Needs cardiology f/u at some point

## 2020-03-09 ENCOUNTER — Encounter: Payer: 59 | Admitting: Family Medicine

## 2020-03-09 DIAGNOSIS — E1159 Type 2 diabetes mellitus with other circulatory complications: Secondary | ICD-10-CM

## 2020-03-09 DIAGNOSIS — G4733 Obstructive sleep apnea (adult) (pediatric): Secondary | ICD-10-CM

## 2020-03-09 DIAGNOSIS — E559 Vitamin D deficiency, unspecified: Secondary | ICD-10-CM

## 2020-03-09 DIAGNOSIS — E785 Hyperlipidemia, unspecified: Secondary | ICD-10-CM

## 2020-03-09 DIAGNOSIS — E118 Type 2 diabetes mellitus with unspecified complications: Secondary | ICD-10-CM

## 2020-03-12 NOTE — Procedures (Signed)
   Patient Name: Victoria [name], Rossa Date: 03/06/2020 Gender: Female D.O.B: 01-25-1981 Age (years): 38 Referring Provider: Richardson Dopp Height (inches): 71 Interpreting Physician: Fransico Him MD, ABSM Weight (lbs): 288 RPSGT: Zadie Rhine BMI: 49 MRN: [id number] Neck Size: 18.00  CLINICAL INFORMATION The patient is referred for a CPAP titration to treat sleep apnea.  SLEEP STUDY TECHNIQUE As per the AASM Manual for the Scoring of Sleep and Associated Events v2.3 (April 2016) with a hypopnea requiring 4% desaturations.  The channels recorded and monitored were frontal, central and occipital EEG, electrooculogram (EOG), submentalis EMG (chin), nasal and oral airflow, thoracic and abdominal wall motion, anterior tibialis EMG, snore microphone, electrocardiogram, and pulse oximetry. Continuous positive airway pressure (CPAP) was initiated at the beginning of the study and titrated to treat sleep-disordered breathing.  MEDICATIONS Medications self-administered by patient taken the night of the study : N/A  TECHNICIAN COMMENTS Comments added by technician: PT HAD ONE REST ROOM VISTED Comments added by scorer: N/A  RESPIRATORY PARAMETERS Optimal PAP Pressure (cm): 12  AHI at Optimal Pressure (/hr):0.0 Overall Minimal O2 (%):89.0  Supine % at Optimal Pressure (%):100 Minimal O2 at Optimal Pressure (%): 94.0   SLEEP ARCHITECTURE The study was initiated at 9:59:06 PM and ended at 4:28:47 AM.  Sleep onset time was 5.4 minutes and the sleep efficiency was 93.3%. The total sleep time was 363.5 minutes.  The patient spent 1.4% of the night in stage N1 sleep, 72.5% in stage N2 sleep, 2.6% in stage N3 and 23.5% in REM.Stage REM latency was 82.0 minutes  Wake after sleep onset was 20.7. Alpha intrusion was absent. Supine sleep was 100.00%.  CARDIAC DATA The 2 lead EKG demonstrated sinus rhythm. The mean heart rate was 67.1 beats per minute. Other EKG findings include: None.  LEG  MOVEMENT DATA The total Periodic Limb Movements of Sleep (PLMS) were 0. The PLMS index was 0.0. A PLMS index of <15 is considered normal in adults.  IMPRESSIONS - The optimal PAP pressure was 12 cm of water. - Central sleep apnea was not noted during this titration (CAI = 0.0/h). - Mild oxygen desaturations were observed during this titration (min O2 = 89.0%). - No snoring was audible during this study. - No cardiac abnormalities were observed during this study. - Clinically significant periodic limb movements were not noted during this study. Arousals associated with PLMs were rare.  DIAGNOSIS - Obstructive Sleep Apnea (G47.33)  RECOMMENDATIONS - Trial of CPAP therapy on 12 cm H2O with a Medium size Fisher&Paykel Full Face Mask Simplus mask and heated humidification. - Avoid alcohol, sedatives and other CNS depressants that may worsen sleep apnea and disrupt normal sleep architecture. - Sleep hygiene should be reviewed to assess factors that may improve sleep quality. - Weight management and regular exercise should be initiated or continued. - Return to Sleep Center for re-evaluation after 8 weeks of therapy  [Electronically signed] 03/12/2020 11:30 AM  Fransico Him MD, [place], American Board of Sleep Medicine   NPI: [id number]

## 2020-03-12 NOTE — Progress Notes (Signed)
Chief Complaint  Patient presents with  . Diabetes    fasting med check. Has not been using CPAP, just had sleep study. Has diabetic eye exam at [facility] I will get. No new concerns.    Patient presents for 3 month follow-up.  She forgot to take all her medications last night and this morning. She forgets her meds a couple of times/week. She takes her meds opposite on the weekends, as she now works night shifts.  She has a pill box, but Sunday night/Monday morning transitions are still hard. She has only had this issue since she switched to night shifts in August. She doesn't like it, and is working with her supervisor to try and switch her shifts. She ended up in the ER a couple of weeks ago for very high blood pressure.  Diabetes:A1c was above goal on last check (8%), and Januvia was added to [place] and metformin. She reports there was a mixup at the pharmacy, didn't get the [place] filled until 2 weeks ago (initially told $95, to get coupon, when got coupon, went back and told only $5). She denies side effects to her medications. Sugars are running once 89, 100, 140's, max of 160, all nonfasting. Hasn't been checking fasting sugars.   At her last visit she was eating a large amount of fruit, hadn't been exercising.  Only drinking diet soda. She cut back on her fruit intake. Still not getting regular exercise. She denies hypoglycemia, polydipsia, polyuria. Diabetic eye exam was in 12/2019 (no results received; Mauri Reading)  Lab Results  Component Value Date   HGBA1C 8.0 (A) 12/08/2019   Vitamin D deficiency: Last level was 29.6 in 11/2019, shortly after completing her 12 week course of prescription D, and still taking MVI (hadn't started D3 2000 IU yet, as had been recommended).  She currently is taking her 2000 IU daily, and her MVI daily. Previously it had been 18.4 in 07/2019, when only taking MVI daily.  Hyperlipidemia: Sherestarted atorvastatin in February and denies  side effects. Lipids in July were at goal. Still using condoms for contraception, and aware of the potential risks of statins and pregnancy.   Lab Results  Component Value Date   CHOL 149 12/08/2019   HDL 49 12/08/2019   LDLCALC 81 12/08/2019   TRIG 105 12/08/2019   CHOLHDL 3.0 12/08/2019   Hypertension:  She continues on losartan, spironolactone, and chlorthalidone 25mg  (which was added to her regimen by cardiologist in 08/2019).   She admits that she isn't taking the chlorthalidone like she is supposed to--she states it caused vaginal itching, discomfort, swelling.  She had to see her GYN for it.  This resolved since she cut back on it--she is only taking it 2x/week (symptoms recur if she takes it daily). She previously had hypokalemia from HCTZ in past, not on aldactone at the time. She couldn't tolerate amlodipine or nifedipine.   BP's have been running "all over the place" Friday it was 134/90, usually it is higher, up to 160/110. She is eating bananas, has only infrequent muscle cramps.  She had ER visit last month for headache. BP was very high (185/115), treated with labetalol and "headache cocktail" Had chem done in ER, K+ was 3.5  She hasn't seen cardiologist in f/u yet, and has no scheduled appt.  They previously mentioned Clonidine or labetalol as next possible steps if she didn't tolerate chlorthalidone, in order to get BP to goal, and possibly referring to hypertension clinic if unable  to get good control.  She reports she just hasn't had time for anything for herself.  Her husband tore his achilles, he can't drive, she has to take him back/forth to [facility] for visits [facility] issue), and  To PT twice a week, as well as driving her kids to/from school. Hasn't had the time for appointments for herself. Mother has been sick--neurosarcoidosis. Fortunately, she is responding to steroids. Her dad has been caring for her mom, can't help out with her kids, nor can other  friends/relatives/neighbors.  OSA:  She had sleep study in June, showing moderate OSA, moderate oxygen desaturation. At her last visit, her machine hadn't been working right, wasn't using CPAP, and had been having more headaches. She was able to fix her machine. She had been scheduled for CPAP titration in July, but it looks like that wasn't actually done until 10/11, results just came in.  She reports that was the best night's sleep she has had in a LONG time! IMPRESSIONS - The optimal PAP pressure was 12 cm of water. - Central sleep apnea was not noted during this titration (CAI = 0.0/h). - Mild oxygen desaturations were observed during this titration (min O2 = 89.0%). - No snoring was audible during this study. - No cardiac abnormalities were observed during this study. - Clinically significant periodic limb movements were not noted during this study. Arousals associated with PLMs were rare.  She still has her machine, reports it is old (8 years), doesn't know how to set/adjust pressure. Got it through [facility].  She got a message a year ago that they are no longer doing her supplies.  [facility] "sounds familiar".   PMH, PSH, SH and Family history reviewed and updated today. Also reviewed HM, since doesn't come here for CPE, sees GYN. No mammo yet (too young).  Immunizations reviewed, UTD.  Will get flu shot today.   Outpatient Encounter Medications as of 03/13/2020  Medication Sig Note  . acetaminophen (TYLENOL) 325 MG tablet Take 650 mg by mouth every 6 (six) hours as needed for moderate pain.    Marland Kitchen atorvastatin (LIPITOR) 20 MG tablet TAKE 1 TABLET (20 MG TOTAL) BY MOUTH DAILY.   . chlorthalidone (HYGROTON) 25 MG tablet Take 1 tablet (25 mg total) by mouth daily. 03/13/2020: Taking only 2x/week (taking daily caused vaginal/yeast problems)  . Cholecalciferol (VITAMIN D) 50 MCG (2000 UT) CAPS Take 1 capsule by mouth daily.   . dapagliflozin propanediol (FARXIGA) 10 MG TABS tablet Take 1  tablet (10 mg total) by mouth daily.   Marland Kitchen glucose blood (FREESTYLE LITE) test strip USE AS DIRECTED 2 (TWO) TIMES DAILY   . losartan (COZAAR) 100 MG tablet Take 1 tablet (100 mg total) by mouth daily.   . metFORMIN (GLUCOPHAGE) 1000 MG tablet TAKE 1 TABLET (1,000 MG TOTAL) BY MOUTH TWICE A DAY WITH A MEAL. (Patient taking differently: Take 1,000 mg by mouth 2 (two) times daily with a meal. )   . Multiple Vitamins-Minerals (WOMENS DAILY FORMULA PO) Take 1 tablet by mouth daily.   . sitaGLIPtin (JANUVIA) 50 MG tablet Take 1 tablet (50 mg total) by mouth daily. 03/13/2020: Started 2 weeks ago  . spironolactone (ALDACTONE) 25 MG tablet Take 1 tablet (25 mg total) by mouth daily.   Marland Kitchen albuterol (PROVENTIL) (5 MG/ML) 0.5% nebulizer solution Take 0.5 mLs (2.5 mg total) by nebulization every 6 (six) hours as needed for wheezing or shortness of breath. (Patient not taking: Reported on 03/13/2020)   . ibuprofen (ADVIL) 800  MG tablet Take 1 tablet (800 mg total) by mouth 3 (three) times daily as needed. (Patient not taking: Reported on 03/13/2020)   . [DISCONTINUED] amoxicillin-clavulanate (AUGMENTIN) 875-125 MG tablet Take 1 tablet by mouth 2 (two) times daily. (Patient not taking: Reported on 02/21/2020)   . [DISCONTINUED] APPLE CIDER VINEGAR PO Take 2 each by mouth 2 (two) times daily. 05/26/2019: goli  . [DISCONTINUED] Vitamin D, Ergocalciferol, (DRISDOL) 1.25 MG (50000 UNIT) CAPS capsule Take 1 capsule (50,000 Units total) by mouth every 7 (seven) days. (Patient not taking: Reported on 12/08/2019)    No facility-administered encounter medications on file as of 03/13/2020.   Allergies  Allergen Reactions  . Dilaudid [Hydromorphone Hcl] Hives  . Morphine And Related Hives  . Peanut-Containing Drug Products Hives  . Strawberry Extract Swelling    Swelling is of the eye.    ROS: no fever, chills, URI symptoms, cough, shortness of breath, chest pain. No dizziness, palpitations. No nausea, vomiting, bowel  changes No urinary complaints. Currently no vaginal complaints (see HPI). Rare mildmuscle cramps, noedema Moods are good. Headaches around her cycles, if she hasn't slept. Energy is better since taking her Vitamin D. +fatigue   PHYSICAL EXAM:  BP (!) 150/100   Pulse 80   Ht 5\' 4"  (1.626 m)   Wt 289 lb (131.1 kg)   LMP 02/18/2020   BMI 49.61 kg/m    Wt Readings from Last 3 Encounters:  03/13/20 289 lb (131.1 kg)  03/06/20 288 lb (130.6 kg)  12/08/19 286 lb 12.8 oz (130.1 kg)    Pleasant, obese, well-appearing female, in good spirits, in no distress HEENT conjunctiva and sclera are clear, EOMI, wearing mask Neck: no lymphadenopathy, thyromegaly or carotid bruit Heart: regular rate and rhythm, no murmur or ectopy Lungs: clear bilaterally, no wheezes, rales, ronchi Abdomen: obese, soft, nontender, no mass Back: no CVA or spinal tenderness Extremities: no edema, 2+ pulses. Neuro: alert and oriented, normal gait.  Psych: normal mood, affect, hygiene and grooming  Lab Results  Component Value Date   HGBA1C 7.5 (A) 03/13/2020    ASSESSMENT/PLAN:  Diabetes mellitus type 2 with complications (HCC) - O2D still above goal, but only added Januvia 2 weeks ago. Cont all 3 meds - Plan: HgB A1c  Vitamin D deficiency - check level to determine if current supplements are maintaining normal level - Plan: VITAMIN D 25 Hydroxy (Vit-D Deficiency, Fractures)  Hypertension associated with diabetes (HCC)  OSA (obstructive sleep apnea) - Will get set up for CPAP at 12cm pressure. Discussed importance of regular use of CPAP  Hyperlipidemia associated with type 2 diabetes mellitus ([place]) - cont statin  Essential hypertension - uncontrolled. Will reach out to Cardiology--not able to tolerate chlorthalidone daily, needs regimen adjusted. CPAP and reduced stress will help  Class 3 severe obesity due to excess calories with serious comorbidity and body mass index (BMI) of 45.0 to 49.9 in adult  [facility])  Need for influenza vaccination - Plan: Flu Vaccine QUAD 6+ mos PF IM (Fluarix Quad PF)   Will contact Mauri Reading to get DM eye exam report.  Will send note to Richardson Dopp, PA in cardiology that she has seen.  [facility] referral for CPAP (check her machine vs getting another)  F/u 3 months

## 2020-03-13 ENCOUNTER — Ambulatory Visit (INDEPENDENT_AMBULATORY_CARE_PROVIDER_SITE_OTHER): Payer: 59 | Admitting: Family Medicine

## 2020-03-13 ENCOUNTER — Encounter: Payer: Self-pay | Admitting: Family Medicine

## 2020-03-13 ENCOUNTER — Other Ambulatory Visit: Payer: Self-pay

## 2020-03-13 ENCOUNTER — Other Ambulatory Visit: Payer: Self-pay | Admitting: *Deleted

## 2020-03-13 VITALS — BP 150/100 | HR 80 | Ht 64.0 in | Wt 289.0 lb

## 2020-03-13 DIAGNOSIS — G4733 Obstructive sleep apnea (adult) (pediatric): Secondary | ICD-10-CM | POA: Diagnosis not present

## 2020-03-13 DIAGNOSIS — I10 Essential (primary) hypertension: Secondary | ICD-10-CM | POA: Diagnosis not present

## 2020-03-13 DIAGNOSIS — I152 Hypertension secondary to endocrine disorders: Secondary | ICD-10-CM

## 2020-03-13 DIAGNOSIS — E118 Type 2 diabetes mellitus with unspecified complications: Secondary | ICD-10-CM | POA: Diagnosis not present

## 2020-03-13 DIAGNOSIS — Z6841 Body Mass Index (BMI) 40.0 and over, adult: Secondary | ICD-10-CM

## 2020-03-13 DIAGNOSIS — E785 Hyperlipidemia, unspecified: Secondary | ICD-10-CM

## 2020-03-13 DIAGNOSIS — E559 Vitamin D deficiency, unspecified: Secondary | ICD-10-CM | POA: Diagnosis not present

## 2020-03-13 DIAGNOSIS — E1169 Type 2 diabetes mellitus with other specified complication: Secondary | ICD-10-CM

## 2020-03-13 DIAGNOSIS — E1159 Type 2 diabetes mellitus with other circulatory complications: Secondary | ICD-10-CM

## 2020-03-13 DIAGNOSIS — Z23 Encounter for immunization: Secondary | ICD-10-CM

## 2020-03-13 LAB — POCT GLYCOSYLATED HEMOGLOBIN (HGB A1C): Hemoglobin A1C: 7.5 % — AB (ref 4.0–5.6)

## 2020-03-13 NOTE — Patient Instructions (Addendum)
Set reminders in your phone for your medications (at least for Sunday night and Monday morning).  You can try melatonin or benadryl if needed to help you sleep related to your shift changes.  The recommendation for your CPAP pressure is 12.  See if you can adjust the pressure on your current machine.  I will be putting in a referral for this and supplies.  I'll be sending a note to the cardiologist regarding your blood pressure and intolerance to daily chlorthalidone.  I suspect that getting you on CPAP nightly will help your blood pressure some.  But we likely will need to make other changes to your regimen, to keep your blood pressure at goal. (under 130/80).

## 2020-03-14 LAB — VITAMIN D 25 HYDROXY (VIT D DEFICIENCY, FRACTURES): Vit D, 25-Hydroxy: 29.7 ng/mL — ABNORMAL LOW (ref 30.0–100.0)

## 2020-03-16 ENCOUNTER — Telehealth: Payer: Self-pay | Admitting: *Deleted

## 2020-03-16 ENCOUNTER — Telehealth: Payer: Self-pay | Admitting: Physician Assistant

## 2020-03-16 ENCOUNTER — Encounter: Payer: Self-pay | Admitting: Family Medicine

## 2020-03-16 DIAGNOSIS — I10 Essential (primary) hypertension: Secondary | ICD-10-CM

## 2020-03-16 NOTE — Telephone Encounter (Signed)
I received f/u from her PCP regarding her HTN management. I think she would benefit from seeing Dr. Oval Linsey in the HTN clinic. Please contact patient to let her know and place the referral. Please make note that the pt sometimes has difficulty making it to in person appts and virtual visit may be the best option.  Richardson Dopp, PA-C    03/16/2020 8:21 AM

## 2020-03-16 NOTE — Telephone Encounter (Signed)
Left detailed message on voicemail to call back for sleep results.

## 2020-03-16 NOTE — Telephone Encounter (Signed)
-----   Message from Sueanne Margarita, MD sent at 03/12/2020 11:34 AM EDT ----- Please let patient know that they had a successful PAP titration and let DME know that orders are in EPIC.  Please set up 8 week OV with me.

## 2020-03-16 NOTE — Telephone Encounter (Signed)
I called and spoke with patient, she is agreeable to referral for hypertension clinic. Referral started to Dr. Oval Linsey.

## 2020-03-23 ENCOUNTER — Ambulatory Visit (INDEPENDENT_AMBULATORY_CARE_PROVIDER_SITE_OTHER): Payer: 59 | Admitting: Cardiovascular Disease

## 2020-03-23 ENCOUNTER — Other Ambulatory Visit: Payer: Self-pay | Admitting: Family Medicine

## 2020-03-23 ENCOUNTER — Encounter: Payer: Self-pay | Admitting: Cardiovascular Disease

## 2020-03-23 ENCOUNTER — Encounter: Payer: Self-pay | Admitting: *Deleted

## 2020-03-23 ENCOUNTER — Other Ambulatory Visit: Payer: Self-pay

## 2020-03-23 VITALS — BP 146/114 | HR 82 | Ht 62.0 in | Wt 293.0 lb

## 2020-03-23 DIAGNOSIS — I10 Essential (primary) hypertension: Secondary | ICD-10-CM | POA: Diagnosis not present

## 2020-03-23 DIAGNOSIS — G4733 Obstructive sleep apnea (adult) (pediatric): Secondary | ICD-10-CM

## 2020-03-23 DIAGNOSIS — J4520 Mild intermittent asthma, uncomplicated: Secondary | ICD-10-CM

## 2020-03-23 DIAGNOSIS — E119 Type 2 diabetes mellitus without complications: Secondary | ICD-10-CM

## 2020-03-23 MED ORDER — CARVEDILOL 25 MG PO TABS: 25.0000 mg | ORAL_TABLET | Freq: Two times a day (BID) | ORAL | 3 refills | Status: DC

## 2020-03-23 MED FILL — METFORMIN HCL 1000 MG TABS: 1000 | 90 days supply | Qty: 180 | Fill #0

## 2020-03-23 MED FILL — SPIRONOLACTONE 25 MG TABS: 25 | 90 days supply | Qty: 90 | Fill #1

## 2020-03-23 MED FILL — CARVEDILOL 25 MG TABLET: 25 | 90 days supply | Qty: 180 | Fill #0

## 2020-03-23 MED FILL — LOSARTAN POTASSIUM 100 MG T: 100 | 90 days supply | Qty: 90 | Fill #1

## 2020-03-23 MED FILL — FARXIGA 10 MG TABLET: 10 | 90 days supply | Qty: 90 | Fill #1

## 2020-03-23 NOTE — Patient Instructions (Addendum)
Medication Instructions:  START CARVEDILOL 25 MG TWICE A DAY   Labwork: NONE   Testing/Procedures: NONE   Follow-Up: 1 MONTH PHARM D 04/25/2020 at 10:00 am    You will receive a phone call from the PREP exercise and nutrition program to schedule an initial assessment.  Referrals:  WILL HAVE AMY CALL YOU REGARDING STRESS MANAGEMENT    Special Instructions:   MONITOR YOUR BLOOD PRESSURE TWICE A DAY, LOG IN BOOK PROVIDED. BRING BOOK AND BLOOD PRESSURE MACHINE TO FOLLOW UP   DASH Eating Plan DASH stands for "Dietary Approaches to Stop Hypertension." The DASH eating plan is a healthy eating plan that has been shown to reduce high blood pressure (hypertension). It may also reduce your risk for type 2 diabetes, heart disease, and stroke. The DASH eating plan may also help with weight loss. What are tips for following this plan?  General guidelines  Avoid eating more than 2,300 mg (milligrams) of salt (sodium) a day. If you have hypertension, you may need to reduce your sodium intake to 1,500 mg a day.  Limit alcohol intake to no more than 1 drink a day for nonpregnant women and 2 drinks a day for men. One drink equals 12 oz of beer, 5 oz of wine, or 1 oz of hard liquor.  Work with your health care provider to maintain a healthy body weight or to lose weight. Ask what an ideal weight is for you.  Get at least 30 minutes of exercise that causes your heart to beat faster (aerobic exercise) most days of the week. Activities may include walking, swimming, or biking.  Work with your health care provider or diet and nutrition specialist (dietitian) to adjust your eating plan to your individual calorie needs. Reading food labels   Check food labels for the amount of sodium per serving. Choose foods with less than 5 percent of the Daily Value of sodium. Generally, foods with less than 300 mg of sodium per serving fit into this eating plan.  To find whole grains, look for the word "whole" as  the first word in the ingredient list. Shopping  Buy products labeled as "low-sodium" or "no salt added."  Buy fresh foods. Avoid canned foods and premade or frozen meals. Cooking  Avoid adding salt when cooking. Use salt-free seasonings or herbs instead of table salt or sea salt. Check with your health care provider or pharmacist before using salt substitutes.  Do not fry foods. Cook foods using healthy methods such as baking, boiling, grilling, and broiling instead.  Cook with heart-healthy oils, such as olive, canola, soybean, or sunflower oil. Meal planning  Eat a balanced diet that includes: ? 5 or more servings of fruits and vegetables each day. At each meal, try to fill half of your plate with fruits and vegetables. ? Up to 6-8 servings of whole grains each day. ? Less than 6 oz of lean meat, poultry, or fish each day. A 3-oz serving of meat is about the same size as a deck of cards. One egg equals 1 oz. ? 2 servings of low-fat dairy each day. ? A serving of nuts, seeds, or beans 5 times each week. ? Heart-healthy fats. Healthy fats called Omega-3 fatty acids are found in foods such as flaxseeds and coldwater fish, like sardines, salmon, and mackerel.  Limit how much you eat of the following: ? Canned or prepackaged foods. ? Food that is high in trans fat, such as fried foods. ? Food that is high  in saturated fat, such as fatty meat. ? Sweets, desserts, sugary drinks, and other foods with added sugar. ? Full-fat dairy products.  Do not salt foods before eating.  Try to eat at least 2 vegetarian meals each week.  Eat more home-cooked food and less restaurant, buffet, and fast food.  When eating at a restaurant, ask that your food be prepared with less salt or no salt, if possible. What foods are recommended? The items listed may not be a complete list. Talk with your dietitian about what dietary choices are best for you. Grains Whole-grain or whole-wheat bread.  Whole-grain or whole-wheat pasta. Brown rice. [place]. Bulgur. Whole-grain and low-sodium cereals. Pita bread. Low-fat, low-sodium crackers. Whole-wheat flour tortillas. Vegetables Fresh or frozen vegetables (raw, steamed, roasted, or grilled). Low-sodium or reduced-sodium tomato and vegetable juice. Low-sodium or reduced-sodium tomato sauce and tomato paste. Low-sodium or reduced-sodium canned vegetables. Fruits All fresh, dried, or frozen fruit. Canned fruit in natural juice (without added sugar). Meat and other protein foods Skinless chicken or [place]. Ground chicken or [place]. Pork with fat trimmed off. Fish and seafood. Egg whites. Dried beans, peas, or lentils. Unsalted nuts, nut butters, and seeds. Unsalted canned beans. Lean cuts of beef with fat trimmed off. Low-sodium, lean deli meat. Dairy Low-fat (1%) or fat-free (skim) milk. Fat-free, low-fat, or reduced-fat cheeses. Nonfat, low-sodium ricotta or cottage cheese. Low-fat or nonfat yogurt. Low-fat, low-sodium cheese. Fats and oils Soft margarine without trans fats. Vegetable oil. Low-fat, reduced-fat, or light mayonnaise and salad dressings (reduced-sodium). Canola, safflower, olive, soybean, and sunflower oils. Avocado. Seasoning and other foods Herbs. Spices. Seasoning mixes without salt. Unsalted popcorn and pretzels. Fat-free sweets. What foods are not recommended? The items listed may not be a complete list. Talk with your dietitian about what dietary choices are best for you. Grains Baked goods made with fat, such as croissants, muffins, or some breads. Dry pasta or rice meal packs. Vegetables Creamed or fried vegetables. Vegetables in a cheese sauce. Regular canned vegetables (not low-sodium or reduced-sodium). Regular canned tomato sauce and paste (not low-sodium or reduced-sodium). Regular tomato and vegetable juice (not low-sodium or reduced-sodium). Angie Fava. Olives. Fruits Canned fruit in a light or heavy syrup.  Fried fruit. Fruit in cream or butter sauce. Meat and other protein foods Fatty cuts of meat. Ribs. Fried meat. Berniece Salines. Sausage. Bologna and other processed lunch meats. Salami. Fatback. Hotdogs. Bratwurst. Salted nuts and seeds. Canned beans with added salt. Canned or smoked fish. Whole eggs or egg yolks. Chicken or [place] with skin. Dairy Whole or 2% milk, cream, and half-and-half. Whole or full-fat cream cheese. Whole-fat or sweetened yogurt. Full-fat cheese. Nondairy creamers. Whipped toppings. Processed cheese and cheese spreads. Fats and oils Butter. Stick margarine. Lard. Shortening. Ghee. Bacon fat. Tropical oils, such as coconut, palm kernel, or palm oil. Seasoning and other foods Salted popcorn and pretzels. Onion salt, garlic salt, seasoned salt, table salt, and sea salt. Worcestershire sauce. Tartar sauce. Barbecue sauce. Teriyaki sauce. Soy sauce, including reduced-sodium. Steak sauce. Canned and packaged gravies. Fish sauce. Oyster sauce. Cocktail sauce. Horseradish that you find on the shelf. Ketchup. Mustard. Meat flavorings and tenderizers. Bouillon cubes. Hot sauce and Tabasco sauce. Premade or packaged marinades. Premade or packaged taco seasonings. Relishes. Regular salad dressings. Where to find more information:  National Heart, Lung, and [place]: https://wilson-eaton.com/  American Heart Association: www.heart.org Summary  The DASH eating plan is a healthy eating plan that has been shown to reduce high blood pressure (hypertension). It may  also reduce your risk for type 2 diabetes, heart disease, and stroke.  With the DASH eating plan, you should limit salt (sodium) intake to 2,300 mg a day. If you have hypertension, you may need to reduce your sodium intake to 1,500 mg a day.  When on the DASH eating plan, aim to eat more fresh fruits and vegetables, whole grains, lean proteins, low-fat dairy, and heart-healthy fats.  Work with your health care provider or diet and  nutrition specialist (dietitian) to adjust your eating plan to your individual calorie needs. This information is not intended to replace advice given to you by your health care provider. Make sure you discuss any questions you have with your health care provider. Document Released: 05/02/2011 Document Revised: 04/25/2017 Document Reviewed: 05/06/2016 Elsevier Patient Education  2020 [employer].
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
## 2020-03-24 ENCOUNTER — Telehealth: Payer: Self-pay

## 2020-03-24 NOTE — Telephone Encounter (Signed)
Call placed ref PREP referral. Explained program. Is interested.  Able to do Am or evening classes.  Unable to do next 1pm class start Will CB when starting next am or evening class

## 2020-03-27 ENCOUNTER — Telehealth: Payer: Self-pay

## 2020-03-27 DIAGNOSIS — Z0000 Encounter for general adult medical examination without abnormal findings: Secondary | ICD-10-CM

## 2020-03-27 NOTE — Telephone Encounter (Signed)
Called patient per Dr. Skeet Latch referral to discuss health coaching for stress management. Patient is interested in having the initial session. Patient has been scheduled for 03/29/20 at 10:45am.

## 2020-03-29 ENCOUNTER — Ambulatory Visit: Payer: 59

## 2020-03-29 ENCOUNTER — Other Ambulatory Visit: Payer: Self-pay

## 2020-03-29 ENCOUNTER — Ambulatory Visit: Payer: Self-pay

## 2020-03-29 DIAGNOSIS — Z0000 Encounter for general adult medical examination without abnormal findings: Secondary | ICD-10-CM

## 2020-03-29 NOTE — Progress Notes (Signed)
Appointment Outcome:  Completed, Session #: Initial health coaching session  AGREEMENTS SECTION     Overall Goal(s): Stress management Manage stress eating Lose 5-10 pounds by Jan. 26, 2022                                           Agreement/Action Steps:  Set aside time to blog, vlog, and/or begin writing book for at least 15 - 30 mins per day Bible reading/study - Wednesday from 6:30pm - 8pm Create a support group Self-check-in  Replace one unhealthy snack with a healthier option   Progress Notes:  Patient is having trouble with stress eating because she has a difficult time coping with stress. Patient typically eats chips, chocolate, and various types of candies, or whatever she has at the time. Patient stated that she has diabetes and her A1C is a 7.5. Patient stated that she has attempted to lose weight in the past and was able to lose 30 pounds but gained 15 back because of stress eating. Patient is currently taking care of two family members, works, and is a mother. Patient has a lot of responsibilities that are causing her some anxiety and depression. Patient has not been able to engage in self-care in months. Patient states that she is trying to watch what she eats at times but is critiqued by her husband. Patient states that this deters her from wanting to try to make changes although he is trying to be supportive.   Currently, the patient weighs 294 pounds and wants to weigh around 200 pounds. Patient is interested in losing 5-10 pounds by changing what she eats if she stress eat. Patient currently drinks Pure Protein Shakes. Patient does not drink caffeine and drink diet Sprite. However, she stated that she must have her coffee. Patient stated that she read food labels and purchase fruit in their natural juices. Patient is interested in learning more about healthy eating habits as we move forward in health coaching.   Patient stated that she has tried to schedule time to  herself, but it never works out because of the many responsibilities that she has. Patient used to blog, vlog, and was interested in writing a book. Patient stated that this is something that she loves to do.  . Indicators of Success and Accountability:  To be able to carve out time for self-care and to change the unhealthy snack option. . Readiness: Patient is in the preparation stage of stress management to reduce stress eating. Patient has a plan of action in place.   . Strengths and Supports: Patient is deciding on who will be a part of her support system. Patient has been able to lose 30 pounds on her own by making behavior changes in the past. . Challenges and Barriers: Stress and responsibility of caring for her family.   Coaching Outcomes: Patient has received a signed copy of the Health Coaching agreement and the Code of Ethics. The patient will attend health coaching sessions biweekly until June 21, 2020.   Patient will replace eating chips for string cheese and beef stick and will take these to work. Patient was provided the glycemic index via email for her review. The glycemic index was explained to patient during visit. Patient has challenged herself to a No Fast-Food November. The aim is to get the patient to cook at home regularly instead of eating out  as much. Patient is interested in searching for healthy snack options.   Patient has agreed to check in with self when she is overwhelmed/stressed by asking herself two questions, "How do I feel right now?" and "What do I need right now?" This will allow the patient time to focus on her self-care and center herself in the moment to calm down. Patient will continue her bible reading/studying on Wednesdays. Patient will set aside time to work on blog, [place], and/or her book for at least 15-30 minutes nightly. Patient stated that she may spend this quiet time sometimes with husband while he engages in his wind down time before bed. Patient  will decide on who she wants to support her during her behavior change.

## 2020-03-29 NOTE — Telephone Encounter (Signed)
Left detailed message on voicemail and informed patient to call back. 

## 2020-03-30 ENCOUNTER — Ambulatory Visit: Payer: 59 | Admitting: Cardiovascular Disease

## 2020-04-03 ENCOUNTER — Telehealth: Payer: 59 | Admitting: Emergency Medicine

## 2020-04-03 ENCOUNTER — Other Ambulatory Visit: Payer: Self-pay | Admitting: Emergency Medicine

## 2020-04-03 DIAGNOSIS — J45909 Unspecified asthma, uncomplicated: Secondary | ICD-10-CM

## 2020-04-03 MED ORDER — ALBUTEROL SULFATE HFA 108 (90 BASE) MCG/ACT IN AERS: 1.0000 | INHALATION_SPRAY | Freq: Four times a day (QID) | RESPIRATORY_TRACT | 1 refills | Status: DC | PRN

## 2020-04-03 MED FILL — ALBUTEROL SULFATE HFA 108 (: 108 (90 BAS | 25 days supply | Qty: 18 | Fill #0

## 2020-04-03 NOTE — Progress Notes (Signed)
Visit for Asthma  Based on what you have shared with me, it looks like you may have a flare up of your asthma.  Asthma is a chronic (ongoing) lung disease which results in airway obstruction, inflammation and hyper-responsiveness.   Asthma symptoms vary from person to person, with common symptoms including nighttime awakening and decreased ability to participate in normal activities as a result of shortness of breath. It is often triggered by changes in weather, changes in the season, changes in air temperature, or inside (home, school, daycare or work) allergens such as animal dander, mold, mildew, woodstoves or cockroaches.   It can also be triggered by hormonal changes, extreme emotion, physical exertion or an upper respiratory tract illness.     It is important to identify the trigger, and then eliminate or avoid the trigger if possible.   If you have been prescribed medications to be taken on a regular basis, it is important to follow the asthma action plan and to follow guidelines to adjust medication in response to increasing symptoms of decreased peak expiratory flow rate  Treatment: I have prescribed: Albuterol (Proventil HFA; Ventolin HFA) 108 (90 Base) MCG/ACT Inhaler 2 puffs into the lungs every six hours as needed for wheezing or shortness of breath  HOME CARE . Only take medications as instructed by your medical team. . Consider wearing a mask or scarf to improve breathing air temperature have been shown to decrease irritation and decrease exacerbations . Get rest. . Taking a steamy shower or using a humidifier may help nasal congestion sand ease sore throat pain. You can place a towel over your head and breathe in the steam from hot water coming from a faucet. . Using a saline nasal spray works much the same way.  . Cough drops, hare candies and sore throat lozenges may  ease your cough.  . Avoid close contacts especially the very you and the elderly . Cover your mouth if you cough or sneeze . Always remember to wash your hands.    GET HELP RIGHT AWAY IF: . You develop worsening symptoms; breathlessness at rest, drowsy, confused or agitated, unable to speak in full sentences . You have coughing fits . You develop a severe headache or visual changes . You develop shortness of breath, difficulty breathing or start having chest pain . Your symptoms persist after you have completed your treatment plan . If your symptoms do not improve within 10 days  MAKE SURE YOU . Understand these instructions. . Will watch your condition. . Will get help right away if you are not doing well or get worse.   Your e-visit answers were reviewed by a board certified advanced clinical practitioner to complete your personal care plan, Depending upon the condition, your plan could have included both over the counter or prescription medications.  Please review your pharmacy choice. Your safety is important to [place]. If you have drug allergies check your prescription carefully. You can use MyChart to ask questions about today's visit, request a non-urgent call back, or ask for a work or school excuse for 24 hours related to this e-Visit. If it has been greater than 24 hours you will need to follow up with your provider, or enter a new e-Visit to address those concerns.  You will get an e-mail in the next two days asking about your experience. I hope that your e-visit has been valuable and will speed your recovery. Thank you for using e-visits.  I have spent approximately 5-10 minutes  reviewing the patient's medical chart and information.

## 2020-04-06 NOTE — Telephone Encounter (Signed)
Informed patient of sleep study results and patient understanding was verbalized. Patient understands her sleep study showed they had a successful PAP titration and let DME know that orders are in EPIC. Please set up 8 week OV with me.  Pt is aware and agreeable to her results.  Upon patient request DME selection is [facility]. Patient understands she/he will be contacted by [facility] to set up her/he cpap. Patient understands to call if [facility] does not contact her/he with new setup in a timely manner. Patient understands they will be called once confirmation has been received from [facility] that they have received their new machine to schedule 10 week follow up appointment.   [facility] notified of new cpap order  Please add to airview Patient was grateful for the call and thanked me.

## 2020-04-12 ENCOUNTER — Telehealth: Payer: Self-pay

## 2020-04-12 ENCOUNTER — Ambulatory Visit: Payer: 59

## 2020-04-12 DIAGNOSIS — Z0000 Encounter for general adult medical examination without abnormal findings: Secondary | ICD-10-CM

## 2020-04-12 NOTE — Telephone Encounter (Signed)
Returned patient's call to reschedule appt for today. Patient is now scheduled for 04/18/20 at 9:30am.

## 2020-04-18 ENCOUNTER — Ambulatory Visit: Payer: 59

## 2020-04-18 ENCOUNTER — Telehealth: Payer: Self-pay

## 2020-04-18 DIAGNOSIS — Z0000 Encounter for general adult medical examination without abnormal findings: Secondary | ICD-10-CM

## 2020-04-18 NOTE — Telephone Encounter (Signed)
Called patient because they did not show for their 9:45a appointment. Left patient a message to call back to reschedule session with Care Guide at [phone].

## 2020-04-25 ENCOUNTER — Ambulatory Visit: Payer: 59

## 2020-04-27 ENCOUNTER — Ambulatory Visit: Payer: 59

## 2020-04-27 MED FILL — ATORVASTATIN CALCIUM 20 MG: 20 | 30 days supply | Qty: 30 | Fill #1

## 2020-04-27 MED FILL — JANUVIA 50 MG TABLET: 50 | 30 days supply | Qty: 30 | Fill #1
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
## 2020-06-15 ENCOUNTER — Ambulatory Visit (HOSPITAL_COMMUNITY)
Admission: EM | Admit: 2020-06-15 | Discharge: 2020-06-15 | Disposition: A | Discharge status: Home / Self Care | Payer: 59

## 2020-06-15 ENCOUNTER — Encounter: Payer: 59 | Admitting: Family Medicine

## 2020-06-15 ENCOUNTER — Emergency Department (HOSPITAL_COMMUNITY): Payer: 59

## 2020-06-15 ENCOUNTER — Emergency Department (HOSPITAL_COMMUNITY)
Admission: EM | Admit: 2020-06-15 | Discharge: 2020-06-15 | Disposition: A | Discharge status: Home / Self Care | Payer: 59 | Attending: Emergency Medicine | Admitting: Emergency Medicine

## 2020-06-15 ENCOUNTER — Encounter (HOSPITAL_COMMUNITY): Payer: Self-pay

## 2020-06-15 ENCOUNTER — Other Ambulatory Visit (HOSPITAL_COMMUNITY): Payer: Self-pay | Admitting: Student in an Organized Health Care Education/Training Program

## 2020-06-15 ENCOUNTER — Other Ambulatory Visit: Payer: Self-pay

## 2020-06-15 DIAGNOSIS — U071 COVID-19: Secondary | ICD-10-CM | POA: Insufficient documentation

## 2020-06-15 DIAGNOSIS — R079 Chest pain, unspecified: Secondary | ICD-10-CM | POA: Diagnosis not present

## 2020-06-15 DIAGNOSIS — Z7984 Long term (current) use of oral hypoglycemic drugs: Secondary | ICD-10-CM | POA: Diagnosis not present

## 2020-06-15 DIAGNOSIS — Z91010 Allergy to peanuts: Secondary | ICD-10-CM | POA: Insufficient documentation

## 2020-06-15 DIAGNOSIS — Z79899 Other long term (current) drug therapy: Secondary | ICD-10-CM | POA: Diagnosis not present

## 2020-06-15 DIAGNOSIS — J45901 Unspecified asthma with (acute) exacerbation: Secondary | ICD-10-CM | POA: Diagnosis not present

## 2020-06-15 DIAGNOSIS — R0602 Shortness of breath: Secondary | ICD-10-CM | POA: Diagnosis not present

## 2020-06-15 DIAGNOSIS — R059 Cough, unspecified: Secondary | ICD-10-CM | POA: Diagnosis present

## 2020-06-15 DIAGNOSIS — I10 Essential (primary) hypertension: Secondary | ICD-10-CM | POA: Insufficient documentation

## 2020-06-15 DIAGNOSIS — E1121 Type 2 diabetes mellitus with diabetic nephropathy: Secondary | ICD-10-CM | POA: Diagnosis not present

## 2020-06-15 LAB — BASIC METABOLIC PANEL
Anion gap: 9 (ref 5–15)
BUN: 7 mg/dL (ref 6–20)
CO2: 21 mmol/L — ABNORMAL LOW (ref 22–32)
Calcium: 8.9 mg/dL (ref 8.9–10.3)
Chloride: 108 mmol/L (ref 98–111)
Creatinine, Ser: 0.65 mg/dL (ref 0.44–1.00)
GFR, Estimated: >60 mL/min (ref >60)
Glucose, Bld: 129 mg/dL — ABNORMAL HIGH (ref 70–99)
Potassium: 3.9 mmol/L (ref 3.5–5.1)
Sodium: 138 mmol/L (ref 135–145)

## 2020-06-15 LAB — CBC
HCT: 33.8 % — ABNORMAL LOW (ref 36.0–46.0)
Hemoglobin: 11.1 g/dL — ABNORMAL LOW (ref 12.0–15.0)
MCH: 25.1 pg — ABNORMAL LOW (ref 26.0–34.0)
MCHC: 32.8 g/dL (ref 30.0–36.0)
MCV: 76.5 fL — ABNORMAL LOW (ref 80.0–100.0)
Platelets: 305 10*3/uL (ref 150–400)
RBC: 4.42 MIL/uL (ref 3.87–5.11)
RDW: 14.2 % (ref 11.5–15.5)
WBC: 5.8 10*3/uL (ref 4.0–10.5)
nRBC: 0 % (ref 0.0–0.2)

## 2020-06-15 LAB — I-STAT BETA HCG BLOOD, ED (MC, WL, AP ONLY): I-stat hCG, quantitative: <5 m[IU]/mL (ref <5)

## 2020-06-15 LAB — TROPONIN I (HIGH SENSITIVITY)
Troponin I (High Sensitivity): 5 ng/L (ref <18)
Troponin I (High Sensitivity): 4 ng/L (ref <18)

## 2020-06-15 LAB — SARS CORONAVIRUS 2 BY RT PCR (HOSPITAL ORDER, PERFORMED IN ~~LOC~~ HOSPITAL LAB): SARS Coronavirus 2: POSITIVE — AB

## 2020-06-15 MED ORDER — PREDNISONE 20 MG PO TABS
60.0000 mg | ORAL_TABLET | Freq: Once | ORAL | Status: AC
  Administered 2020-06-15: 60 mg via ORAL
  Filled 2020-06-15: qty 3

## 2020-06-15 MED ORDER — PREDNISONE 20 MG PO TABS: 40.0000 mg | ORAL_TABLET | Freq: Every day | ORAL | 0 refills | Status: DC

## 2020-06-15 MED ORDER — IPRATROPIUM-ALBUTEROL 0.5-2.5 (3) MG/3ML IN SOLN
3.0000 mL | Freq: Once | RESPIRATORY_TRACT | Status: AC
  Administered 2020-06-15: 3 mL via RESPIRATORY_TRACT
  Filled 2020-06-15: qty 3

## 2020-06-15 MED FILL — predniSONE 20 MG TABS: 20 | 4 days supply | Qty: 8 | Fill #0

## 2020-06-15 NOTE — Discharge Instructions (Signed)
A prescription for steroids was called into the common pharmacy.  Please start these tomorrow and use for the next 4 days.  If you feel that you are wheezing or feeling short of breath, you can use your inhalers at home.  If you develop worsening chest pain, breathing difficulty, high fevers, vomiting, diarrhea or concerns for dehydration, please return the emergency department

## 2020-06-15 NOTE — ED Notes (Signed)
Patient is being discharged from the Urgent Care and sent to the Emergency Department via POV . Per Claiborne Billings, NP patient is in need of higher level of care due to respiratory distress. Patient is aware and verbalizes understanding of plan of care.

## 2020-06-15 NOTE — ED Triage Notes (Addendum)
Pt is here today due to since last night. Pt reports she has h/o asthma and used her inhaler with no relief. Pt reports that her sob is worsening. [facility]. Pt also reporting chest pain.pt is 100% on RA. Pt also reports being in contact with a her son who is covid +
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
## 2020-06-15 NOTE — ED Provider Notes (Incomplete)
Patient is a 40 year old female with a history of obesity, asthma who presents today with a 1 day history of cough, congestion and flulike illness as well as feeling shortness of breath.  Patient son is positive for COVID and she tested positive for COVID today.  Oxygen saturation is reassuring at 100% but she has been intermittently tachypneic.  During her last episode of COVID approximately 1 year ago she did not require admission but did have to use nebs at home as well as steroids.  She has no significant wheezing today and reports that she thinks the DuoNeb did help for the tightness to improve.  She will need neb medications but has a machine at home as well as an incentive spirometer.  She will need steroids upon discharge.  However she does appear stable for discharge and was given return precautions.

## 2020-06-16 ENCOUNTER — Telehealth (HOSPITAL_COMMUNITY): Payer: Self-pay | Admitting: Family

## 2020-06-16 NOTE — Telephone Encounter (Signed)
Called to discuss with Victoria [name] about Covid symptoms and the use of antiviral therapy as well as monoclonal antibody infusion for those with mild to moderate Covid symptoms and at a high risk of hospitalization.     Pt is qualified for antiviral therapy as she is fully vaccinated, due to co-morbid conditions and/or a member of an at-risk group, however declines medication at this time. She states she is experiencing SOB, however this occurred the last time she had covid. She states she declined MAB treatment last time, and will forgo MAB or antivirals this time.  Symptoms tier reviewed as well as criteria for ending isolation.  Symptoms reviewed that would warrant ED/Hospital evaluation. Preventative practices reviewed. Patient verbalized understanding.     Patient Active Problem List   Diagnosis Date Noted  . Morbid obesity ([place])   . Dizziness 04/05/2018  . Asthma 12/03/2017  . SOB (shortness of breath) 12/03/2017  . Lactic acid acidosis 12/03/2017  . Pericarditis 12/03/2017  . Atypical chest pain   . Hypokalemia 12/01/2017  . S/P cesarean section 03/08/2016  . Morbid obesity with BMI of 50.0-59.9, adult ([place]) 10/12/2014  . Vitamin D deficiency 05/13/2012  . Dermoid cyst of ovary 09/19/2011  . UTI (urinary tract infection) 09/19/2011  . OSA (obstructive sleep apnea) 09/01/2011  . Controlled type 2 diabetes mellitus with microalbuminuria, without long-term current use of insulin ([place]) 02/18/2011  . Asthma exacerbation 02/18/2011  . Essential hypertension, benign 02/18/2011    Simone Rodenbeck,NP

## 2020-06-23 ENCOUNTER — Encounter: Payer: Self-pay | Admitting: Family Medicine

## 2020-06-24 NOTE — Telephone Encounter (Signed)
Error

## 2020-07-13 MED FILL — ATORVASTATIN CALCIUM 20 MG: 20 | 30 days supply | Qty: 30 | Fill #2

## 2020-07-13 MED FILL — JANUVIA 50 MG TABLET: 50 | 30 days supply | Qty: 30 | Fill #2

## 2020-07-18 NOTE — Progress Notes (Deleted)
  Patient presents for med check, f/u on chronic problems. She had COVID 06/15/2020.  She required nebulizer treatments and was treated with steroids from ER visit.  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 She uses a pill box, but Sunday night/Monday morning transitions are still hard. At that visit she reported working with her supervisor to try and switch her shifts. UPDATE  HTN--she was seen in HTN clinic by Dr. Oval Linsey in 02/2020. She was started on carvedilol 25 mg twice daily, and to continue spironolactone and losartan.  Mentioned possibly switching losartan to a more effective ARB. Renal artery [place] was recommended--not done. Once her blood pressure is little better controlled, plan is to hold the losartan and spironolactone to check for hyperaldosteronism.  She had a health coaching session 03/2020 (through HTN clinic)  She no-showed her f/u appt in November with cardiology, and doesn't have any f/u scheduled.   Diabetes:A1c was above goal on last check (7.5%, but was down from 8% in July). Januvia was added toFarxiga and metformin in 11/2019, though she didn't actually get it filled and start taking it until early October 2021. She denies side effects to her medications. Sugars are running    She denies hypoglycemia, polydipsia, polyuria. Her last eye exam was in 12/2019--we got records from [facility], and confirmed that it was NOT a diabetic eye exam.  She needs to schedule DM Eye exam   Lab Results  Component Value Date   HGBA1C 7.5 (A) 03/13/2020    Vitamin D deficiency: Last level was29.7 in 02/2020.  She was advised to double up from 2000 to 4000 IU through the end of March. She is currently taking  Hyperlipidemia: Sheis taking atorvastatin and denies side effects. Still using condomsfor contraception, and aware of the potential risks of statins and  pregnancy.Last lipids were at goal. Lab Results  Component Value Date   CHOL 149 12/08/2019   HDL 49 12/08/2019   LDLCALC 81 12/08/2019   TRIG 105 12/08/2019   CHOLHDL 3.0 12/08/2019    OSA: Using CPAP?   PMH, [place], SH reviewed   ROS:    PHYSICAL EXAM:  Wt Readings from Last 3 Encounters:  06/15/20 292 lb (132.5 kg)  03/23/20 293 lb (132.9 kg)  03/13/20 289 lb (131.1 kg)     ASSESSMENT/PLAN:  A1c  Did she ever schedule diabetic eye exam? We got records from 12/2019 and realized that it was not a diabetic exam (not sure why not).  Needs to schedule if not done  Did she get renal artery [place] as d/w Dr. Oval Linsey? No-showed visit and has no f/u scheduled with HTN clinic  Is she out of losartan?? (should be based on rx's). RF vs change to valsartan? 160 vs 320? Should also need Januvia and spironolactone, metformin, farxiga and lipitor

## 2020-07-19 ENCOUNTER — Telehealth: Payer: Self-pay | Admitting: *Deleted

## 2020-07-19 ENCOUNTER — Ambulatory Visit: Payer: 59 | Admitting: Family Medicine

## 2020-07-19 DIAGNOSIS — G4733 Obstructive sleep apnea (adult) (pediatric): Secondary | ICD-10-CM

## 2020-07-19 DIAGNOSIS — J45909 Unspecified asthma, uncomplicated: Secondary | ICD-10-CM

## 2020-07-19 DIAGNOSIS — E559 Vitamin D deficiency, unspecified: Secondary | ICD-10-CM

## 2020-07-19 DIAGNOSIS — E118 Type 2 diabetes mellitus with unspecified complications: Secondary | ICD-10-CM

## 2020-07-19 DIAGNOSIS — I10 Essential (primary) hypertension: Secondary | ICD-10-CM

## 2020-07-19 DIAGNOSIS — E1169 Type 2 diabetes mellitus with other specified complication: Secondary | ICD-10-CM

## 2020-07-19 DIAGNOSIS — E1159 Type 2 diabetes mellitus with other circulatory complications: Secondary | ICD-10-CM

## 2020-07-19 NOTE — Telephone Encounter (Signed)

## 2020-07-19 NOTE — Telephone Encounter (Signed)
Pt needs no show letter and to be r/s for med check.  Someone also needs to look at how many no shows she has had.

## 2020-07-20 ENCOUNTER — Other Ambulatory Visit: Payer: Self-pay

## 2020-07-20 ENCOUNTER — Encounter: Payer: Self-pay | Admitting: Family Medicine

## 2020-07-20 ENCOUNTER — Ambulatory Visit: Payer: 59 | Admitting: Family Medicine

## 2020-07-20 VITALS — BP 144/98 | HR 68 | Ht 62.0 in | Wt 294.6 lb

## 2020-07-20 DIAGNOSIS — E785 Hyperlipidemia, unspecified: Secondary | ICD-10-CM | POA: Diagnosis not present

## 2020-07-20 DIAGNOSIS — IMO0002 Reserved for concepts with insufficient information to code with codable children: Secondary | ICD-10-CM

## 2020-07-20 DIAGNOSIS — E1129 Type 2 diabetes mellitus with other diabetic kidney complication: Secondary | ICD-10-CM

## 2020-07-20 DIAGNOSIS — G4733 Obstructive sleep apnea (adult) (pediatric): Secondary | ICD-10-CM | POA: Diagnosis not present

## 2020-07-20 DIAGNOSIS — E1165 Type 2 diabetes mellitus with hyperglycemia: Secondary | ICD-10-CM

## 2020-07-20 DIAGNOSIS — R809 Proteinuria, unspecified: Secondary | ICD-10-CM

## 2020-07-20 DIAGNOSIS — I10 Essential (primary) hypertension: Secondary | ICD-10-CM

## 2020-07-20 DIAGNOSIS — Z9114 Patient's other noncompliance with medication regimen: Secondary | ICD-10-CM | POA: Diagnosis not present

## 2020-07-20 DIAGNOSIS — E1169 Type 2 diabetes mellitus with other specified complication: Secondary | ICD-10-CM

## 2020-07-20 DIAGNOSIS — Z91148 Patient's other noncompliance with medication regimen for other reason: Secondary | ICD-10-CM

## 2020-07-20 LAB — POCT GLYCOSYLATED HEMOGLOBIN (HGB A1C): Hemoglobin A1C: 8.9 % — AB (ref 4.0–5.6)

## 2020-07-20 NOTE — Telephone Encounter (Signed)
Looks like pt cancels a lot She canceld on 06/15/20 03/19/20 medcheck 10/14/21medcheck 02/25/21medcheck 07/21/19 for labs 02/22/221medcheck 02/19/21labs 05/17/19 follow up I dont see any other no show for her

## 2020-07-20 NOTE — Progress Notes (Signed)
Chief Complaint  Patient presents with  . Diabetes    Fasting med check. No new concerns.    Patient presents for med check, f/u on chronic problems. She had COVID 06/15/2020.  She required nebulizer treatments and was treated with steroids from ER visit. She has completely recovered.  At her last visit, in 02/2020, she reported forgetting her meds a couple of times/week, since switching to working night shifts in [place] uses a pill box, but Sunday night/Monday morning transitions are still hard. At that visit she reported working with her supervisor to try and switch her shifts. She is still working night shifts, still missing pills. Currently working Fri/Sat/Sun/Mon nights. Her husband isn't working.  She is working nights, taking care of kids, admits she is tired, which contributes to forgetting to take her meds. She says she forgets the nighttime meds about once a week, but her morning meds most days (running around with the kids). She doesn't carry a purse, doesn't carry meds with her.  She isn't happy with her current job, very stressful. 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  HTN--she was seen in HTN clinic by Dr. Oval Linsey in 02/2020. She was started on carvedilol 25 mg twice daily, and to continue spironolactone and losartan. Mentioned possibly switching losartan to a more effective ARB. Renal artery [place] was recommended; she missed her f/u appointment, family got COVID, needs to reschedule.  Looks like it wasn't ordered yet.  They also mentioned--once her blood pressure is little better controlled, plan is to hold the losartan and spironolactone to check for hyperaldosteronism.  She had a health coaching session 03/2020 (through HTN clinic) which was helpful.  She no-showed her f/u appt in November with cardiology, and doesn't have any f/u scheduled.  Noncompliant with many of her  meds, as reported above.  Diabetes:A1c was above goal on last check (7.5%, but was down from 8% in July). Januvia was added toFarxiga and metformin in 11/2019, though she didn't actually get it filled and start taking it until early October 2021. She denies side effects to her medications. She hasn't been checking her sugars.  She had 2 meters, can't find them, but hasn't really looked.  She denies hypoglycemia, polydipsia, polyuria (just some frequency with spironolactone).  Occasionally notes this when diet is "bad" (ie eating Little Debbie cakes) Her last eye exam was in 12/2019--we got records [facility], and confirmed that it was NOT a diabetic eye exam.  She reports she was told it WAS, and that she PAID for diabetic eye exam.  She will look into this (reviewed the document we received from Mauri Reading). Recent Labs       Lab Results  Component Value Date   HGBA1C 7.5 (A) 03/13/2020      Vitamin D deficiency: Last level was29.7 in 02/2020.  She was advised to double up from 2000 to 4000 IU through the end of March. She has been missing some, but has doubled up on the days she takes it.  Hyperlipidemia: Sheis taking atorvastatin and denies side effects. Still using condomsfor contraception, and aware of the potential risks of statins and pregnancy.Last lipids were at goal. She takes this at night, so only missing a dose once a week.  Lab Results  Component Value Date   CHOL 149 12/08/2019   HDL 49 12/08/2019   LDLCALC 81 12/08/2019  TRIG 105 12/08/2019   CHOLHDL 3.0 12/08/2019    OSA: There is a shortage of CPAP's, took a long time to get.  She didn't have the copay at that time it came in, so couldn't pick it up.  She will contact them once she has the money, hoping they have a CPAP available.   PMH, PSH, SH reviewed  Outpatient Encounter Medications as of 07/20/2020  Medication Sig Note  . atorvastatin (LIPITOR) 20 MG tablet TAKE 1 TABLET (20 MG  TOTAL) BY MOUTH DAILY. (Patient taking differently: Take 20 mg by mouth every evening.)   . carvedilol (COREG) 25 MG tablet Take 1 tablet (25 mg total) by mouth 2 (two) times daily. 07/20/2020: She is taking this, frequently missing morning doses, evening dose missed only 1x/week  . Cholecalciferol (VITAMIN D) 50 MCG (2000 UT) CAPS Take 1 capsule by mouth daily. 07/20/2020: Missing frequently  . dapagliflozin propanediol (FARXIGA) 10 MG TABS tablet Take 1 tablet (10 mg total) by mouth daily. 07/20/2020: Missing frequently  . glucose blood (FREESTYLE LITE) test strip USE AS DIRECTED 2 (TWO) TIMES DAILY   . losartan (COZAAR) 100 MG tablet Take 1 tablet (100 mg total) by mouth daily. (Patient taking differently: Take 100 mg by mouth every evening.) 07/20/2020: Missing it 1x/week  . metFORMIN (GLUCOPHAGE) 1000 MG tablet TAKE 1 TABLET (1,000 MG TOTAL) BY MOUTH TWICE A DAY WITH A MEAL. (Patient taking differently: Take 1,000 mg by mouth 2 (two) times daily with a meal.) 07/20/2020: Missing doses frequently, taking once daily 6x/week  . Multiple Vitamins-Minerals (WOMENS DAILY FORMULA PO) Take 1 tablet by mouth daily.   . sitaGLIPtin (JANUVIA) 50 MG tablet Take 1 tablet (50 mg total) by mouth daily. 07/20/2020: Missing frequentl  . spironolactone (ALDACTONE) 25 MG tablet Take 1 tablet (25 mg total) by mouth daily. 07/20/2020: Misses frequently  . acetaminophen (TYLENOL) 500 MG tablet Take 1,000 mg by mouth as needed for moderate pain. (Patient not taking: Reported on 07/20/2020)   . albuterol (PROVENTIL) (2.5 MG/3ML) 0.083% nebulizer solution Take 2.5 mg by nebulization as needed for wheezing or shortness of breath. (Patient not taking: Reported on 07/20/2020)   . albuterol (VENTOLIN HFA) 108 (90 Base) MCG/ACT inhaler Inhale 1-2 puffs into the lungs every 6 (six) hours as needed for wheezing or shortness of breath. (Patient not taking: Reported on 07/20/2020)   . ibuprofen (ADVIL) 800 MG tablet Take 1 tablet (800 mg  total) by mouth 3 (three) times daily as needed. (Patient not taking: No sig reported)    No facility-administered encounter medications on file as of 07/20/2020.   Allergies  Allergen Reactions  . Dilaudid [Hydromorphone Hcl] Hives  . Morphine And Related Hives  . Peanut-Containing Drug Products Hives  . Strawberry Extract Swelling and Other (See Comments)    Eyes swell    ROS: no fever, chills, URI symptoms, cough, shortness of breath, chest pain, headaches, dizziness. She reportsfrequent heartburn--eating late, +caffeine. No dysphagia. No bowel changes.  +stress (work stress, husband out of job, feeling tired related to schedule), but moods overall okay. See HPI.   PHYSICAL EXAM:  BP (!) 144/98   Pulse 68   Ht 5\' 2"  (1.575 m)   Wt 294 lb 9.6 oz (133.6 kg)   LMP 06/30/2020   BMI 53.88 kg/m   Wt Readings from Last 3 Encounters:  07/20/20 294 lb 9.6 oz (133.6 kg)  06/15/20 292 lb (132.5 kg)  03/23/20 293 lb (132.9 kg)   Well-appearing, pleasant, obese  female in good spirits. HEENT: conjunctiva and sclera are clear, EOMI. 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 Cont current med regimen - Plan: HgB A1c  Essential hypertension - elevated today, med noncompliance contributing. Cont same meds and f/u with HTN clinic (who rec renal a [place]). Low Na diet, wt loss  Obstructive sleep apnea - Not using CPAP--saving up for copay, but when she  has, may need to wait for another in stock. Wt loss encouraged. Risks reviewed  Noncompliance with medications - counseled at length re: measures to improve compliance, related to her fatigue and schedule  Hyperlipidemia associated with type 2 diabetes mellitus ([place]) - lipids at goal on last check. cont atorvastatin and condom use to prevent pregnancy  Discussed her meds and scheduled in detail, to help come up with measures to improve compliance.  Once daily metformin would help (XR), but she recalls tolerating BID better, not tolerating once daily related to loose stools.  Can switch some meds to evening (when she has easier time remembering to take). Prefer for her to take DM meds with a meal, not at bedtime (but if she forgets and sugar is high, fine to take when she remembers). To discuss BP meds with HTN clinic (Coreg extended release? Or other once-daily med?)  She will contact Mauri Reading to determine if diabetic eye exam done (and if so, get [place] the proper documentation), vs schedule DM eye exam.  Thinks tolerated BID dosing better of metformin (due to loose stool)  Though she should need refills on most meds, due to noncompliance, reports having lots of medication.  She just picked up last of januvia and lipitor last week.  We also discussed that she will need to get different insurance when she leaves her job.  Discussed [facility] as low cost option; also discussed that [facility] plan would be less expensive, but would have to switch providers (until she could get new insurance through an employer). She is hesitant to do so, but discussed that if it was the most affordable option, she could temporarily consider this--that she would be welcomed back to this practice, and would prefer her to be compliant with medications and doctor visits wherever would be most affordable for her. She thinks her husband will be able to get another job soon.  I spent 50 minutes dedicated to the care of this  patient, including pre-visit review of records, face to face time, post-visit ordering of testing and documentation.   Change your vitamin D to bedtime dosing. Switch your timing of Januvia and Farxiga--needs to be taken daily. It is best if you can bring you pills with you during the day, and take these when you eat a meal.  If you forget, then take them when you can (even if bedtime).  Follow up with the hypertension clinic--they may be able to streamline some of the medications to help with compliance (?Coreg extended release vs a different, once-daily medication).   Be sure to check with your eye doctor (  [facility]) that you are getting diabetic eye exams. The one we got in 12/2019 was NOT a diabetic eye exam. If this is wrong (when you check on this), have them send [place] documentation of a diabetic eye exam.  Continue to work on weight loss. CPAP is recommended when you can get started on it.  [facility]--look into this as a health insurance option that we take here that is less expensive than some other plans. [facility] [employer] plan may be the least expensive, but we won't be able to see you within the [facility] system.

## 2020-07-20 NOTE — Patient Instructions (Signed)
Change your vitamin D to bedtime dosing. Switch your timing of Januvia and Farxiga--needs to be taken daily. It is best if you can bring you pills with you during the day, and take these when you eat a meal.  If you forget, then take them when you can (even if bedtime).  Follow up with the hypertension clinic--they may be able to streamline some of the medications to help with compliance (?Coreg extended release vs a different, once-daily medication).   Be sure to check with your eye doctor Hassell Done) that you are getting diabetic eye exams. The one we got in 12/2019 was NOT a diabetic eye exam. If this is wrong (when you check on this), have them send [place] documentation of a diabetic eye exam.  Continue to work on weight loss. CPAP is recommended when you can get started on it.  [facility]--look into this as a health insurance option that we take here that is less expensive than some other plans. [facility] [employer] plan may be the least expensive, but we won't be able to see you within the [facility] system.  For your reflux, try and cut back on caffeine, eat small meals frequently, wait at least 2 hours after eating before laying down. If bothersome at night, try elevating the head of the bed.

## 2020-08-17 ENCOUNTER — Other Ambulatory Visit: Payer: Self-pay | Admitting: Family Medicine

## 2020-08-17 DIAGNOSIS — E119 Type 2 diabetes mellitus without complications: Secondary | ICD-10-CM

## 2020-08-17 DIAGNOSIS — I10 Essential (primary) hypertension: Secondary | ICD-10-CM

## 2020-08-17 DIAGNOSIS — I5189 Other ill-defined heart diseases: Secondary | ICD-10-CM

## 2020-08-17 DIAGNOSIS — E118 Type 2 diabetes mellitus with unspecified complications: Secondary | ICD-10-CM

## 2020-08-17 MED FILL — CARVEDILOL 25 MG TABLET: 25 | 90 days supply | Qty: 180 | Fill #1

## 2020-08-17 MED FILL — ATORVASTATIN CALCIUM 20 MG: 20 | 90 days supply | Qty: 90 | Fill #0

## 2020-08-17 MED FILL — LOSARTAN POTASSIUM 100 MG T: 100 | 30 days supply | Qty: 30 | Fill #0

## 2020-08-17 MED FILL — METFORMIN HCL 1000 MG TABS: 1000 | 90 days supply | Qty: 180 | Fill #0

## 2020-08-17 MED FILL — JANUVIA 50 MG TABLET: 50 | 30 days supply | Qty: 30 | Fill #0

## 2020-08-17 MED FILL — FARXIGA 10 MG TABLET: 10 | 90 days supply | Qty: 90 | Fill #0

## 2020-08-17 MED FILL — ALBUTEROL SULFATE HFA 108 (: 108 (90 BAS | 25 days supply | Qty: 18 | Fill #1

## 2020-08-17 MED FILL — SPIRONOLACTONE 25 MG TABLET: 25 | 90 days supply | Qty: 90 | Fill #0

## 2020-09-17 ENCOUNTER — Telehealth: Payer: 59 | Admitting: Family

## 2020-09-17 DIAGNOSIS — J301 Allergic rhinitis due to pollen: Secondary | ICD-10-CM | POA: Diagnosis not present

## 2020-09-17 MED ORDER — FLUTICASONE PROPIONATE 50 MCG/ACT NA SUSP: 2.0000 | Freq: Every day | NASAL | 6 refills | Status: DC

## 2020-09-17 MED ORDER — FEXOFENADINE HCL 60 MG PO TABS: 60.0000 mg | ORAL_TABLET | Freq: Two times a day (BID) | ORAL | 1 refills | Status: DC

## 2020-09-17 NOTE — Progress Notes (Signed)
E visit for Allergic Rhinitis We are sorry that you are not feeling well.  Here is how we plan to help!  Based on what you have shared with me it looks like you have Allergic Rhinitis.  Rhinitis is when a reaction occurs that causes nasal congestion, runny nose, sneezing, and itching.  Most types of rhinitis are caused by an inflammation and are associated with symptoms in the eyes ears or throat. There are several types of rhinitis.  The most common are acute rhinitis, which is usually caused by a viral illness, allergic or seasonal rhinitis, and nonallergic or year-round rhinitis.  Nasal allergies occur certain times of the year.  Allergic rhinitis is caused when allergens in the air trigger the release of histamine in the body.  Histamine causes itching, swelling, and fluid to build up in the fragile linings of the nasal passages, sinuses and eyelids.  An itchy nose and clear discharge are common.  I recommend the following over the counter treatments: You should take a daily dose of antihistamine and Allegra 60 mg twice daily  I also would recommend a nasal spray: Flonase 2 sprays into each nostril once daily   HOME CARE:   You can use an over-the-counter saline nasal spray as needed  Avoid areas where there is heavy dust, mites, or molds  Stay indoors on windy days during the pollen season  Keep windows closed in home, at least in bedroom; use air conditioner.  Use high-efficiency house air filter  Keep windows closed in car, turn AC on re-circulate  Avoid playing out with dog during pollen season  GET HELP RIGHT AWAY IF:   If your symptoms do not improve within 10 days  You become short of breath  You develop yellow or green discharge from your nose for over 3 days  You have coughing fits  MAKE SURE YOU:   Understand these instructions  Will watch your condition  Will get help right away if you are not doing well or get worse  Thank you for choosing an  e-visit. Your e-visit answers were reviewed by a board certified advanced clinical practitioner to complete your personal care plan. Depending upon the condition, your plan could have included both over the counter or prescription medications. Please review your pharmacy choice. Be sure that the pharmacy you have chosen is open so that you can pick up your prescription now.  If there is a problem you may message your provider in [place] to have the prescription routed to another pharmacy. Your safety is important to [place]. If you have drug allergies check your prescription carefully.  For the next 24 hours, you can use MyChart to ask questions about today's visit, request a non-urgent call back, or ask for a work or school excuse from your e-visit provider. You will get an email in the next two days asking about your experience. I hope that your e-visit has been valuable and will speed your recovery.    Approximately 5 minutes was spent documenting and reviewing patient's chart.

## 2020-10-23 ENCOUNTER — Emergency Department (HOSPITAL_COMMUNITY)
Admission: EM | Admit: 2020-10-23 | Discharge: 2020-10-23 | Disposition: A | Discharge status: Home / Self Care | Payer: 59 | Attending: Emergency Medicine | Admitting: Emergency Medicine

## 2020-10-23 ENCOUNTER — Other Ambulatory Visit: Payer: Self-pay

## 2020-10-23 ENCOUNTER — Encounter (HOSPITAL_COMMUNITY): Payer: Self-pay | Admitting: *Deleted

## 2020-10-23 ENCOUNTER — Emergency Department (HOSPITAL_COMMUNITY): Payer: 59

## 2020-10-23 DIAGNOSIS — E1129 Type 2 diabetes mellitus with other diabetic kidney complication: Secondary | ICD-10-CM | POA: Diagnosis not present

## 2020-10-23 DIAGNOSIS — R0789 Other chest pain: Secondary | ICD-10-CM | POA: Diagnosis not present

## 2020-10-23 DIAGNOSIS — M62830 Muscle spasm of back: Secondary | ICD-10-CM | POA: Diagnosis not present

## 2020-10-23 DIAGNOSIS — R110 Nausea: Secondary | ICD-10-CM | POA: Diagnosis not present

## 2020-10-23 DIAGNOSIS — I10 Essential (primary) hypertension: Secondary | ICD-10-CM | POA: Insufficient documentation

## 2020-10-23 DIAGNOSIS — R0602 Shortness of breath: Secondary | ICD-10-CM | POA: Insufficient documentation

## 2020-10-23 DIAGNOSIS — R079 Chest pain, unspecified: Secondary | ICD-10-CM | POA: Diagnosis not present

## 2020-10-23 DIAGNOSIS — R42 Dizziness and giddiness: Secondary | ICD-10-CM | POA: Diagnosis not present

## 2020-10-23 DIAGNOSIS — Z7984 Long term (current) use of oral hypoglycemic drugs: Secondary | ICD-10-CM | POA: Diagnosis not present

## 2020-10-23 DIAGNOSIS — Z79899 Other long term (current) drug therapy: Secondary | ICD-10-CM | POA: Diagnosis not present

## 2020-10-23 DIAGNOSIS — Z91010 Allergy to peanuts: Secondary | ICD-10-CM | POA: Insufficient documentation

## 2020-10-23 LAB — CBC
HCT: 33.1 % — ABNORMAL LOW (ref 36.0–46.0)
Hemoglobin: 10.4 g/dL — ABNORMAL LOW (ref 12.0–15.0)
MCH: 24.2 pg — ABNORMAL LOW (ref 26.0–34.0)
MCHC: 31.4 g/dL (ref 30.0–36.0)
MCV: 77 fL — ABNORMAL LOW (ref 80.0–100.0)
Platelets: 319 10*3/uL (ref 150–400)
RBC: 4.3 MIL/uL (ref 3.87–5.11)
RDW: 14.6 % (ref 11.5–15.5)
WBC: 7.2 10*3/uL (ref 4.0–10.5)
nRBC: 0 % (ref 0.0–0.2)

## 2020-10-23 LAB — TROPONIN I (HIGH SENSITIVITY)
Troponin I (High Sensitivity): 6 ng/L (ref <18)
Troponin I (High Sensitivity): 5 ng/L (ref <18)

## 2020-10-23 LAB — BASIC METABOLIC PANEL
Anion gap: 7 (ref 5–15)
BUN: 11 mg/dL (ref 6–20)
CO2: 23 mmol/L (ref 22–32)
Calcium: 8.6 mg/dL — ABNORMAL LOW (ref 8.9–10.3)
Chloride: 107 mmol/L (ref 98–111)
Creatinine, Ser: 0.69 mg/dL (ref 0.44–1.00)
GFR, Estimated: >60 mL/min (ref >60)
Glucose, Bld: 184 mg/dL — ABNORMAL HIGH (ref 70–99)
Potassium: 3.6 mmol/L (ref 3.5–5.1)
Sodium: 137 mmol/L (ref 135–145)

## 2020-10-23 LAB — D-DIMER, QUANTITATIVE: D-Dimer, Quant: 0.31 ug/mL-FEU (ref 0.00–0.50)

## 2020-10-23 LAB — I-STAT BETA HCG BLOOD, ED (MC, WL, AP ONLY): I-stat hCG, quantitative: <5 m[IU]/mL (ref <5)

## 2020-10-23 MED ORDER — ALUM & MAG HYDROXIDE-SIMETH 200-200-20 MG/5ML PO SUSP
30.0000 mL | Freq: Once | ORAL | Status: AC
  Administered 2020-10-23: 30 mL via ORAL
  Filled 2020-10-23: qty 30

## 2020-10-23 MED ORDER — SPIRONOLACTONE 25 MG PO TABS
25.0000 mg | ORAL_TABLET | Freq: Every day | ORAL | Status: DC
  Filled 2020-10-23: qty 1

## 2020-10-23 MED ORDER — CARVEDILOL 12.5 MG PO TABS: 25.0000 mg | ORAL_TABLET | Freq: Two times a day (BID) | ORAL | Status: DC

## 2020-10-23 MED ORDER — NITROGLYCERIN 2 % TD OINT
1.0000 [in_us] | TOPICAL_OINTMENT | Freq: Once | TRANSDERMAL | Status: AC
  Administered 2020-10-23: 1 [in_us] via TOPICAL
  Filled 2020-10-23: qty 1

## 2020-10-23 MED ORDER — IBUPROFEN 400 MG PO TABS
600.0000 mg | ORAL_TABLET | Freq: Once | ORAL | Status: AC
  Administered 2020-10-23: 600 mg via ORAL
  Filled 2020-10-23: qty 1

## 2020-10-23 MED ORDER — DAPAGLIFLOZIN PROPANEDIOL 10 MG PO TABS
10.0000 mg | ORAL_TABLET | Freq: Every day | ORAL | Status: DC
  Filled 2020-10-23: qty 1

## 2020-10-23 MED ORDER — LIDOCAINE VISCOUS HCL 2 % MT SOLN
15.0000 mL | Freq: Once | OROMUCOSAL | Status: AC
  Administered 2020-10-23: 15 mL via ORAL
  Filled 2020-10-23: qty 15
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
## 2020-10-23 NOTE — ED Triage Notes (Signed)
The pt is c/o chest pain a few days ago and again tonight while she was here at work.   She took no meds.   C/o dizziness after something exciting lmp now

## 2020-10-23 NOTE — ED Notes (Signed)
Pt c/o blurry vision. BP 180/128

## 2020-10-23 NOTE — ED Notes (Signed)
Pt stating sudden onset of nausea. Vomit/spit bag given to her. HR NSR

## 2020-10-31 NOTE — Progress Notes (Deleted)
ER follow-up: She went to ER 5/30 with chest pain.  She had had 2 days of intermittent CP, more persistent on the day of ER visit, associated with SOB, had some nausea. Pain was reproducible per ER provider, with palpation of L chest wall and shoulder girdle musculature.  EKG did not show e/o ischemia or pericarditis (pt has hx), negative troponin, negative D-dimer, normal CXR.  She was hypertensive (and treated with her normal morning meds). BP Readings from Last 3 Encounters:  10/23/20 (!) 149/112  07/20/20 (!) 144/98  06/15/20 (!) 172/100   Nonfasting glucose was 184; Hg was a little lower than baseline at 10.4. Her chest pain was felt to be chest wall pain, with early pericarditis in differential.  Motrin was recommended  Patient presents for med check.  She was last seen here in 06/2020, at which time her A1c and BP's were above goal, contributed by medication noncompliance.  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 Through June/July??  HTN--she was seen in HTN clinic by Dr. Oval Linsey in 02/2020. She was started oncarvedilol 25 mg twice daily, and to continue spironolactone and losartan.Mentioned possiblyswitching losartan to a more effective ARB.Renal artery [place] was recommended, never scheduled; she missed her f/u appointment, family got COVID, never rescheduled.  She was reminded to schedule f/u with Dr. Oval Linsey at her 06/2020 visit. It does not appear that she has any scheduled visit.  Compliance with meds? Home BP's  Diabetes:A1c was elevated at 8.9% in 06/2020, and has been >7 for a while. She hadn't been checking sugars. At last visit, reported med noncompliance, frequent missed doses related to her prior schedule (before switching jobs). She takes metformin (prefers BID, tolerates it better), [place] and Jardiance.  Sugars  She denies hypoglycemia, polydipsia, polyuria (just some frequency with spironolactone).  Her last eye exam  was in 12/2019--we got records from [facility], and confirmed that it was NOT a diabetic eye exam.    Vitamin D deficiency: Last level was29.7 in 02/2020. She was advised to double up from 2000 to 4000 IU through the end of March.  She is currently taking   Hyperlipidemia: Sheis takingatorvastatin and denies side effects. Still using condomsfor contraception, and aware of the potential risks of statins and pregnancy.Last lipids were at goal. She takes this at night, so only missing a dose once a week.  Lab Results  Component Value Date   CHOL 149 12/08/2019   HDL 49 12/08/2019   LDLCALC 81 12/08/2019   TRIG 105 12/08/2019   CHOLHDL 3.0 12/08/2019    OSA: There is a shortage of CPAP's, took a long time to get.  She didn't have the copay at that time it came in, so couldn't pick it up.  She will contact them once she has the money, hoping they have a CPAP available.   PMH, PSH, SH reviewed    ROS: no fever, chills, URI symptoms, GI complaints, urinary complaints. +chest pain per HPI  No edema, headaches, dizziness, muscle cramps, palpitations. Shortness of breath?   PHYSICAL EXAM:  Wt Readings from Last 3 Encounters:  10/23/20 285 lb (129.3 kg)  07/20/20 294 lb 9.6 oz (133.6 kg)  06/15/20 292 lb (132.5 kg)   Pleasant, obese, well-appearing female, in good spirits, in no distress HEENT conjunctiva and sclera are clear, EOMI, wearing mask Neck: no lymphadenopathy, thyromegaly or carotid bruit Heart: regular rate and rhythm, no murmur or ectopy Chest wall:  Lungs: clear bilaterally, no wheezes, rales, ronchi Abdomen: obese, soft, nontender, no mass Back: no CVA or spinal tenderness Extremities: no edema, 2+ pulses.  Neuro: alert and oriented, normal gait.  Psych: normal mood, affect, hygiene and grooming  ?chest wall tenderness?  ASSESSMENT/PLAN:  A1c COVID BOOSTER? No paps on file--when did she last see Dr. Willis Modena?  Need to get results? Did  she start CPAP yet??  Vit D, lipids if fasting TSH, urine microalb c-met, CBC, Fe, ferritin, HepC Ab  Lipitor, metformin, farxiga, januvia, spironolactone RFs needed

## 2020-11-01 ENCOUNTER — Encounter: Payer: 59 | Admitting: Family Medicine

## 2020-11-01 DIAGNOSIS — G4733 Obstructive sleep apnea (adult) (pediatric): Secondary | ICD-10-CM

## 2020-11-01 DIAGNOSIS — E1165 Type 2 diabetes mellitus with hyperglycemia: Secondary | ICD-10-CM

## 2020-11-01 DIAGNOSIS — I10 Essential (primary) hypertension: Secondary | ICD-10-CM

## 2020-11-01 DIAGNOSIS — E118 Type 2 diabetes mellitus with unspecified complications: Secondary | ICD-10-CM

## 2020-11-21 ENCOUNTER — Encounter: Payer: Self-pay | Admitting: Family Medicine

## 2020-12-01 ENCOUNTER — Other Ambulatory Visit (HOSPITAL_COMMUNITY): Payer: Self-pay

## 2020-12-05 ENCOUNTER — Other Ambulatory Visit (HOSPITAL_COMMUNITY): Payer: Self-pay

## 2020-12-05 LAB — HM DIABETES EYE EXAM

## 2020-12-29 NOTE — Telephone Encounter (Signed)
Late Entry:10/31/20 Ivin Booty called from [facility] to say she can not get in touch with the patient and she will be cancelling her order.

## 2021-02-14 DIAGNOSIS — J4520 Mild intermittent asthma, uncomplicated: Secondary | ICD-10-CM | POA: Insufficient documentation

## 2021-03-19 ENCOUNTER — Telehealth: Payer: BLUE CROSS/BLUE SHIELD | Admitting: Physician Assistant

## 2021-03-19 DIAGNOSIS — B379 Candidiasis, unspecified: Secondary | ICD-10-CM | POA: Diagnosis not present

## 2021-03-19 DIAGNOSIS — T3695XA Adverse effect of unspecified systemic antibiotic, initial encounter: Secondary | ICD-10-CM | POA: Diagnosis not present

## 2021-03-19 DIAGNOSIS — R3989 Other symptoms and signs involving the genitourinary system: Secondary | ICD-10-CM | POA: Diagnosis not present

## 2021-03-19 MED ORDER — FLUCONAZOLE 150 MG PO TABS: 150.0000 mg | ORAL_TABLET | Freq: Once | ORAL | 0 refills | Status: AC

## 2021-03-19 MED ORDER — CEPHALEXIN 500 MG PO CAPS: 500.0000 mg | ORAL_CAPSULE | Freq: Two times a day (BID) | ORAL | 0 refills | Status: DC

## 2021-03-19 NOTE — Progress Notes (Signed)
E-Visit for Urinary Problems  We are sorry that you are not feeling well.  Here is how we plan to help!  Based on what you shared with me it looks like you most likely have a simple urinary tract infection.  A UTI (Urinary Tract Infection) is a bacterial infection of the bladder.  Most cases of urinary tract infections are simple to treat but a key part of your care is to encourage you to drink plenty of fluids and watch your symptoms carefully.  I have prescribed Keflex 500 mg twice a day for 7 days.  Your symptoms should gradually improve. Call us if the burning in your urine worsens, you develop worsening fever, back pain or pelvic pain or if your symptoms do not resolve after completing the antibiotic. I will also send in Diflucan for possible yeast infection as well.   Urinary tract infections can be prevented by drinking plenty of water to keep your body hydrated.  Also be sure when you wipe, wipe from front to back and don't hold it in!  If possible, empty your bladder every 4 hours.  HOME CARE Drink plenty of fluids Compete the full course of the antibiotics even if the symptoms resolve Remember, when you need to go.go. Holding in your urine can increase the likelihood of getting a UTI! GET HELP RIGHT AWAY IF: You cannot urinate You get a high fever Worsening back pain occurs You see blood in your urine You feel sick to your stomach or throw up You feel like you are going to pass out  MAKE SURE YOU  Understand these instructions. Will watch your condition. Will get help right away if you are not doing well or get worse.   Thank you for choosing an e-visit.  Your e-visit answers were reviewed by a board certified advanced clinical practitioner to complete your personal care plan. Depending upon the condition, your plan could have included both over the counter or prescription medications.  Please review your pharmacy choice. Make sure the pharmacy is open so you can pick up  prescription now. If there is a problem, you may contact your provider through [employer] and have the prescription routed to another pharmacy.  Your safety is important to [place]. If you have drug allergies check your prescription carefully.   For the next 24 hours you can use MyChart to ask questions about today's visit, request a non-urgent call back, or ask for a work or school excuse. You will get an email in the next two days asking about your experience. I hope that your e-visit has been valuable and will speed your recovery.  I provided 5 minutes of non face-to-face time during this encounter for chart review and documentation.

## 2021-03-22 ENCOUNTER — Other Ambulatory Visit (HOSPITAL_COMMUNITY): Payer: Self-pay

## 2021-04-10 LAB — COMPREHENSIVE METABOLIC PANEL: Albumin: 7 — AB (ref 3.5–5.0)

## 2021-04-10 LAB — MICROALBUMIN / CREATININE URINE RATIO: Microalb Creat Ratio: 61

## 2021-04-23 ENCOUNTER — Other Ambulatory Visit: Payer: Self-pay

## 2021-04-23 ENCOUNTER — Emergency Department (HOSPITAL_COMMUNITY)
Admission: EM | Admit: 2021-04-23 | Discharge: 2021-04-24 | Disposition: A | Discharge status: Home / Self Care | Payer: BLUE CROSS/BLUE SHIELD | Attending: Emergency Medicine | Admitting: Emergency Medicine

## 2021-04-23 ENCOUNTER — Encounter (HOSPITAL_COMMUNITY): Payer: Self-pay

## 2021-04-23 DIAGNOSIS — R519 Headache, unspecified: Secondary | ICD-10-CM | POA: Diagnosis present

## 2021-04-23 DIAGNOSIS — Z79899 Other long term (current) drug therapy: Secondary | ICD-10-CM | POA: Diagnosis not present

## 2021-04-23 DIAGNOSIS — Z91010 Allergy to peanuts: Secondary | ICD-10-CM | POA: Diagnosis not present

## 2021-04-23 DIAGNOSIS — E1129 Type 2 diabetes mellitus with other diabetic kidney complication: Secondary | ICD-10-CM | POA: Diagnosis not present

## 2021-04-23 DIAGNOSIS — N9489 Other specified conditions associated with female genital organs and menstrual cycle: Secondary | ICD-10-CM | POA: Diagnosis not present

## 2021-04-23 DIAGNOSIS — J45909 Unspecified asthma, uncomplicated: Secondary | ICD-10-CM | POA: Diagnosis not present

## 2021-04-23 DIAGNOSIS — Z7984 Long term (current) use of oral hypoglycemic drugs: Secondary | ICD-10-CM | POA: Diagnosis not present

## 2021-04-23 DIAGNOSIS — I10 Essential (primary) hypertension: Secondary | ICD-10-CM | POA: Diagnosis not present

## 2021-04-23 LAB — URINALYSIS, ROUTINE W REFLEX MICROSCOPIC
Bacteria, UA: NONE SEEN
Bilirubin Urine: NEGATIVE
Glucose, UA: >=500 mg/dL — AB
Hgb urine dipstick: NEGATIVE
Ketones, ur: 5 mg/dL — AB
Leukocytes,Ua: NEGATIVE
Nitrite: NEGATIVE
Protein, ur: NEGATIVE mg/dL
Specific Gravity, Urine: 1.011 (ref 1.005–1.030)
pH: 7 (ref 5.0–8.0)

## 2021-04-23 LAB — COMPREHENSIVE METABOLIC PANEL
ALT: 15 U/L (ref 0–44)
AST: 16 U/L (ref 15–41)
Albumin: 3.2 g/dL — ABNORMAL LOW (ref 3.5–5.0)
Alkaline Phosphatase: 66 U/L (ref 38–126)
Anion gap: 7 (ref 5–15)
BUN: 12 mg/dL (ref 6–20)
CO2: 24 mmol/L (ref 22–32)
Calcium: 9.1 mg/dL (ref 8.9–10.3)
Chloride: 103 mmol/L (ref 98–111)
Creatinine, Ser: 0.74 mg/dL (ref 0.44–1.00)
GFR, Estimated: >60 mL/min (ref >60)
Glucose, Bld: 337 mg/dL — ABNORMAL HIGH (ref 70–99)
Potassium: 3.7 mmol/L (ref 3.5–5.1)
Sodium: 134 mmol/L — ABNORMAL LOW (ref 135–145)
Total Bilirubin: 0.3 mg/dL (ref 0.3–1.2)
Total Protein: 7 g/dL (ref 6.5–8.1)

## 2021-04-23 LAB — HCG, QUANTITATIVE, PREGNANCY: hCG, Beta Chain, Quant, S: <1 m[IU]/mL (ref <5)

## 2021-04-23 LAB — CBC WITH DIFFERENTIAL/PLATELET
Abs Immature Granulocytes: 0.02 10*3/uL (ref 0.00–0.07)
Basophils Absolute: 0 10*3/uL (ref 0.0–0.1)
Basophils Relative: 0 %
Eosinophils Absolute: 0.1 10*3/uL (ref 0.0–0.5)
Eosinophils Relative: 2 %
HCT: 33.1 % — ABNORMAL LOW (ref 36.0–46.0)
Hemoglobin: 10.8 g/dL — ABNORMAL LOW (ref 12.0–15.0)
Immature Granulocytes: 0 %
Lymphocytes Relative: 37 %
Lymphs Abs: 2.7 10*3/uL (ref 0.7–4.0)
MCH: 25.4 pg — ABNORMAL LOW (ref 26.0–34.0)
MCHC: 32.6 g/dL (ref 30.0–36.0)
MCV: 77.7 fL — ABNORMAL LOW (ref 80.0–100.0)
Monocytes Absolute: 0.6 10*3/uL (ref 0.1–1.0)
Monocytes Relative: 8 %
Neutro Abs: 4 10*3/uL (ref 1.7–7.7)
Neutrophils Relative %: 53 %
Platelets: 262 10*3/uL (ref 150–400)
RBC: 4.26 MIL/uL (ref 3.87–5.11)
RDW: 14.8 % (ref 11.5–15.5)
WBC: 7.5 10*3/uL (ref 4.0–10.5)
nRBC: 0 % (ref 0.0–0.2)

## 2021-04-23 NOTE — ED Triage Notes (Signed)
Pt BIB EMS, pt reports with HTN and dizziness since 1 hr ago. Pt states that she has been taking her blood pressure medications but they just changed them.

## 2021-04-24 ENCOUNTER — Emergency Department (HOSPITAL_COMMUNITY): Payer: BLUE CROSS/BLUE SHIELD

## 2021-04-24 ENCOUNTER — Other Ambulatory Visit (HOSPITAL_COMMUNITY): Payer: Self-pay

## 2021-04-24 MED ORDER — AMLODIPINE BESYLATE 5 MG PO TABS
5.0000 mg | ORAL_TABLET | Freq: Once | ORAL | Status: AC
  Administered 2021-04-24: 5 mg via ORAL

## 2021-04-24 MED ORDER — HYDRALAZINE HCL 20 MG/ML IJ SOLN
10.0000 mg | Freq: Once | INTRAMUSCULAR | Status: AC
  Administered 2021-04-24: 10 mg via INTRAVENOUS

## 2021-04-24 MED ORDER — LABETALOL HCL 5 MG/ML IV SOLN: 20.0000 mg | Freq: Once | INTRAVENOUS | Status: DC

## 2021-04-24 MED ORDER — LORAZEPAM 2 MG/ML IJ SOLN
1.0000 mg | Freq: Once | INTRAMUSCULAR | Status: AC | PRN
  Administered 2021-04-24: 1 mg via INTRAVENOUS

## 2021-04-24 MED ORDER — AMLODIPINE BESYLATE 5 MG PO TABS
5.0000 mg | ORAL_TABLET | Freq: Every day | ORAL | 0 refills | Status: DC
  Filled 2021-04-24: qty 30, 30d supply, fill #0

## 2021-04-24 MED ORDER — HYDRALAZINE HCL 25 MG PO TABS
25.0000 mg | ORAL_TABLET | Freq: Three times a day (TID) | ORAL | 0 refills | Status: DC
  Filled 2021-04-24: qty 90, 30d supply, fill #0

## 2021-04-24 NOTE — ED Notes (Signed)
Patient returned back from MRI.

## 2021-04-24 NOTE — Discharge Instructions (Addendum)
Start taking Norvasc in addition to your other blood pressure medication.  Follow-up with your primary care have your blood pressure rechecked

## 2021-04-24 NOTE — ED Notes (Signed)
Patient transported to MRI 
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
## 2021-04-24 NOTE — Progress Notes (Signed)
Inpatient Diabetes Program Recommendations  AACE/ADA: New Consensus Statement on Inpatient Glycemic Control (2015)  Target Ranges:  Prepandial:   less than 140 mg/dL      Peak postprandial:   less than 180 mg/dL (1-2 hours)      Critically ill patients:  140 - 180 mg/dL   Lab Results  Component Value Date   GLUCAP 247 (H) 01/03/2019   HGBA1C 8.9 (A) 07/20/2020    Review of Glycemic Control  Diabetes history: DM 2 Outpatient Diabetes medications: Farxiga 10 mg Daily, Metformin 1000 mg bid, Januvia 50 mg Daily Current orders for Inpatient glycemic control:  None in ED being evaluated  Inpatient Diabetes Program Recommendations:    Glucose 337 in labs -  Start Novolog 0-15 units Q4 while in ED  Thanks,  Tama Headings RN, MSN, BC-ADM Inpatient Diabetes Coordinator Team Pager [phone] (8a-5p)

## 2021-05-02 ENCOUNTER — Other Ambulatory Visit (HOSPITAL_COMMUNITY): Payer: Self-pay

## 2021-06-06 LAB — COMPREHENSIVE METABOLIC PANEL: eGFR: >90

## 2021-06-19 ENCOUNTER — Telehealth: Payer: BLUE CROSS/BLUE SHIELD | Admitting: Physician Assistant

## 2021-06-19 DIAGNOSIS — R3989 Other symptoms and signs involving the genitourinary system: Secondary | ICD-10-CM | POA: Diagnosis not present

## 2021-06-19 MED ORDER — CEPHALEXIN 500 MG PO CAPS: 500.0000 mg | ORAL_CAPSULE | Freq: Two times a day (BID) | ORAL | 0 refills | Status: DC

## 2021-06-19 NOTE — Progress Notes (Signed)

## 2021-07-26 ENCOUNTER — Telehealth: Payer: Self-pay

## 2021-07-26 NOTE — Telephone Encounter (Signed)
Pt had to change pcp when her insurance change. She has switched insurance again and now we are back in network. Pt would like to continue care with you. Please advise if she is ok to schedule or if she should continue care with her current pcp on file. ?Elyse Jarvis RMA ?  ?

## 2021-07-27 NOTE — Telephone Encounter (Signed)
I will accept her back, but need her to be compliant with her med checks/appointments and her medications. ?She will be due for repeat A1c (which was 8.8 on last check in Jan) after 09/04/21, so schedule med check for next month. ?(If RF are needed to last until then, she will need to ask her current doctor).  Hasn't been seen her in over a year. ?

## 2021-07-30 NOTE — Telephone Encounter (Signed)
Spoke with patient and advised her of the need to be compliant with meds/appts. And advised her if she needed any RF until April she would need to get from current PCP. Scheduled patient for 09/05/21 @ 9:30am. ?

## 2021-08-25 ENCOUNTER — Telehealth: Payer: 59 | Admitting: Family

## 2021-08-25 DIAGNOSIS — K0889 Other specified disorders of teeth and supporting structures: Secondary | ICD-10-CM

## 2021-08-25 DIAGNOSIS — K047 Periapical abscess without sinus: Secondary | ICD-10-CM

## 2021-08-26 MED ORDER — AMOXICILLIN-POT CLAVULANATE 875-125 MG PO TABS: 1.0000 | ORAL_TABLET | Freq: Two times a day (BID) | ORAL | 0 refills | Status: DC

## 2021-08-26 NOTE — Progress Notes (Signed)
E-Visit for Dental Pain ? ?We are sorry that you are not feeling well.  Here is how we plan to help! ? ?Based on what you have shared with me in the questionnaire, it sounds like you possibly have an abscess tooth.  ? ?Augmentin 875 two times per day for 10 days ? ?It is imperative that you see a dentist within 10 days of this eVisit to determine the cause of the dental pain and be sure it is adequately treated ? ?A toothache or tooth pain is caused when the nerve in the root of a tooth or surrounding a tooth is irritated. Dental (tooth) infection, decay, injury, or loss of a tooth are the most common causes of dental pain. Pain may also occur after an extraction (tooth is pulled out). Pain sometimes originates from other areas and radiates to the jaw, thus appearing to be tooth pain.Bacteria growing inside your mouth can contribute to gum disease and dental decay, both of which can cause pain. A toothache occurs from inflammation of the central portion of the tooth called pulp. The pulp contains nerve endings that are very sensitive to pain. Inflammation to the pulp or pulpitis may be caused by dental cavities, trauma, and infection.  ? ? ?HOME CARE:  ? ?For toothaches: ?Over-the-counter pain medications such as acetaminophen or ibuprofen may be used. Take these as directed on the package while you arrange for a dental appointment. ?Avoid very cold or hot foods, because they may make the pain worse. ?You may get relief from biting on a cotton ball soaked in oil of cloves. You can get oil of cloves at most drug stores. ? ?For jaw pain: ? Aspirin may be helpful for problems in the joint of the jaw in adults. ?If pain happens every time you open your mouth widely, the temporomandibular joint (TMJ) may be the source of the pain. Yawning or taking a large bite of food may worsen the pain. An appointment with your doctor or dentist will help you find the cause. ?  ? ? ?GET HELP RIGHT AWAY IF: ? ?You have a high fever or  chills ?If you have had a recent head or face injury and develop headache, light headedness, nausea, vomiting, or other symptoms that concern you after an injury to your face or mouth, you could have a more serious injury in addition to your dental injury. ?A facial rash associated with a toothache: This condition may improve with medication. Contact your doctor for them to decide what is appropriate. ?Any jaw pain occurring with chest pain: Although jaw pain is most commonly caused by dental disease, it is sometimes referred pain from other areas. People with heart disease, especially people who have had stents placed, people with diabetes, or those who have had heart surgery may have jaw pain as a symptom of heart attack or angina. If your jaw or tooth pain is associated with lightheadedness, sweating, or shortness of breath, you should see a doctor as soon as possible. ?Trouble swallowing or excessive pain or bleeding from gums: If you have a history of a weakened immune system, diabetes, or steroid use, you may be more susceptible to infections. Infections can often be more severe and extensive or caused by unusual organisms. Dental and gum infections in people with these conditions may require more aggressive treatment. An abscess may need draining or IV antibiotics, for example. ? ?MAKE SURE YOU  ? ?Understand these instructions. ?Will watch your condition. ?Will get help right away  if you are not doing well or get worse. ? ?Thank you for choosing an e-visit. ? ?Your e-visit answers were reviewed by a board certified advanced clinical practitioner to complete your personal care plan. Depending upon the condition, your plan could have included both over the counter or prescription medications. ? ?Please review your pharmacy choice. Make sure the pharmacy is open so you can pick up prescription now. If there is a problem, you may contact your provider through [employer] and have the prescription routed to  another pharmacy.  Your safety is important to [place]. If you have drug allergies check your prescription carefully.  ? ?For the next 24 hours you can use MyChart to ask questions about today's visit, request a non-urgent call back, or ask for a work or school excuse. ?You will get an email in the next two days asking about your experience. I hope that your e-visit has been valuable and will speed your recovery. ? ?Approximately 5 minutes was spent documenting and reviewing patient's chart.  ? ? ?

## 2021-09-04 ENCOUNTER — Encounter: Payer: Self-pay | Admitting: Family Medicine

## 2021-09-04 NOTE — Progress Notes (Signed)
Chief Complaint  ?Patient presents with  ? med check  ?  Med check- diabetes medication is causing her to have a yeast infection and makes her vagina swell. Currently on antibiotic . [facility] exam last exam was June 2022, running a1c today  ? ? ?Patient presents for follow-up on her chronic medical problems. ?She has been seeing PCP at [facility] (due to having [employer] insurance plan), but changed insurance and is now able to resume her care here. Last seen in this clinic 06/2020. ? ?She is complaining of her private area being swollen, red, and "ugly".   ?This resolved when she stopped Xigduo, but her sugars went through the roof.  She stopped the medications for 6 weeks, in Jan, resumed the end of February. ?Januvia was no longer covered, so stopped on her own (not stopped by [facility] doctor when Rybelsus was started). ?She was treated in 03/2021 for yeast, and through an e-visit. ?Currently using vagisil for yeast. Not helping with the swelling. ?It is painful.  +itching.  Denies discharge. ?She is also currently on antibiotic for dental infection, and sugars are running high.  She reports ongoing problems with yeast/itching since being on Farxiga, not just related to recent ABX. ? ?Diabetes: 03/2021 her medications were switched from Trulicity to Rybelsus (couldn't tolerate Trulicity due to nausea, belching), and Farxiga and metformin were combined to Xigduo XR to help with pill burden. She is also now using Freestyle Libre to monitor sugars. ?She reports compliance with 3mg  Rybelsus daily and Xigduo daily (though had stopped it for 6 weeks as reported above). ? ?Her last A1c was 8.8% in 05/2021, which was down from 9.4% in 03/2021.  No med changes were made at her January visit. ?Sugars are running 140-170 fasting, overall averaging 250. ? ?She denies hypoglycemia, polydipsia, polyuria. ?Last eye exam was in June, she isn't sure if it was a diabetic exam. ?(12/2019 at [facility] wasn't diabetic exam). ?She checks her feet regularly,  denies concerns.   ? ?Hypertension: at her last PCP visit (with PA Delice Lesch at [facility] in 05/2021) her BP was above goal; no changes were made to her medications, advised to monitor BP's more regularly and f/u in 4 mos.  ?She admits she hasn't been checking her BP. Has a monitor at home. ? ?She reports compliance with valsartan 320mg  (switched from losartan by [employer] in 03/2021), carvedilol 25mg  BID, and spironolactone 25mg  daily. ?She previously was seen in HTN clinic by Dr. Oval Linsey (02/2020).  Renal artery [place] was recommended, and checking for hyperaldosteronism was also planned.  Pt was noncompliant in f/u. ? ?She denies headaches, dizziness, chest pain, shortness of breath, edema, muscle cramps. ? ?Vitamin D deficiency:  Last level was 29.7 in 02/2020.  She is currently taking 2000 U daily. ?  ?Hyperlipidemia:  She is taking atorvastatin 20mg  and denies side effects. Still using condoms for contraception, and aware of the potential risks of statins and pregnancy.  ?Plans to see OB to discuss BTL. ? ?Lipids done 03/2021 at [facility] were at goal: ? Ref Range & Units 4 mo ago  ?Total Cholesterol 25 - 199 MG/DL 149   ?Triglycerides 10 - 150 MG/DL 123   ?HDL Cholesterol 35 - 135 MG/DL 54   ?LDL Cholesterol Calculated 0 - 99 MG/DL 70   ?Total Chol / HDL Cholesterol <4.5 2.8   ?Non-HDL Cholesterol MG/DL 95   ? ?Allergies and asthma: She has had many UC/ER visit for wheezing in the past  She was  referred to allergist through [employer], but never saw them, as her insurance had changed.  Willing to see allergist. ?She has albuterol for prn use, last needed a couple of weeks ago. ? ?4/1 had e-visit for dental pain, rx'd Augmentin. Still taking. Pain improving. ? ?Labs reviewed--had b-met 05/2021, normal except for glucose 192. ?Last c-met was 03/2021--glu 337, o/w fairly normal. Normal LFT's, K+ ?Other labs 03/2021--normal TSH, CBC--Hgb 10.8.  normal measles, mumps, rubella and varicella titers. ? ? ?PMH, PSH, SH reviewed.  FH updated.  Mother  passed away 2021-03-21. ? ?Outpatient Encounter Medications as of 09/05/2021  ?Medication Sig Note  ? acetaminophen (TYLENOL) 500 MG tablet Take 1,000 mg by mouth as needed for moderate pain.   ? albuterol (VENTOLIN HFA) 108 (90 Base) MCG/ACT inhaler INHALE 1-2 PUFFS INTO THE LUNGS EVERY 6 (SIX) HOURS AS NEEDED FOR WHEEZING OR SHORTNESS OF BREATH. 09/05/2021: Uses prn, last used last week  ? amLODipine (NORVASC) 5 MG tablet Take 1 tablet (5 mg total) by mouth daily.   ? amoxicillin-clavulanate (AUGMENTIN) 875-125 MG tablet Take 1 tablet by mouth 2 (two) times daily.   ? atorvastatin (LIPITOR) 20 MG tablet TAKE 1 TABLET (20 MG TOTAL) BY MOUTH DAILY. (Patient taking differently: Take 20 mg by mouth daily.)   ? carvedilol (COREG) 25 MG tablet TAKE 1 TABLET (25 MG TOTAL) BY MOUTH 2 (TWO) TIMES DAILY. (Patient taking differently: Take 25 mg by mouth 2 (two) times daily.)   ? cetirizine (ZYRTEC) 10 MG tablet Take 10 mg by mouth daily.   ? Cholecalciferol (VITAMIN D) 50 MCG (2000 UT) CAPS Take 1 capsule by mouth daily.   ? Continuous Blood Gluc Sensor (FREESTYLE LIBRE 3 SENSOR) MISC SMARTSIG:1 Topical Every 10 Days   ? fluconazole (DIFLUCAN) 150 MG tablet Take 1 tablet (150 mg total) by mouth once for 1 dose. Repeat in 1 week if needed   ? ibuprofen (ADVIL) 800 MG tablet Take 1 tablet (800 mg total) by mouth 3 (three) times daily as needed.   ? Semaglutide (RYBELSUS) 7 MG TABS Take 7 mg by mouth daily.   ? spironolactone (ALDACTONE) 25 MG tablet TAKE 1 TABLET (25 MG TOTAL) BY MOUTH DAILY. (Patient taking differently: Take 25 mg by mouth daily.)   ? valsartan (DIOVAN) 320 MG tablet Take 320 mg by mouth daily.   ? XIGDUO XR 02-999 MG TB24 Take 1 tablet by mouth daily.   ? [DISCONTINUED] diphenhydrAMINE HCl (ALLERGY MED PO) Take by mouth.   ? [DISCONTINUED] RYBELSUS 3 MG TABS Take 1 tablet by mouth daily.   ? albuterol (PROVENTIL) (2.5 MG/3ML) 0.083% nebulizer solution Take 2.5 mg by nebulization as needed for wheezing or  shortness of breath. (Patient not taking: Reported on 09/05/2021)   ? fluticasone (FLONASE) 50 MCG/ACT nasal spray Place 2 sprays into both nostrils daily. (Patient not taking: Reported on 09/05/2021) 09/05/2021: Hasn't used in a couple of months  ? [DISCONTINUED] cephALEXin (KEFLEX) 500 MG capsule Take 1 capsule (500 mg total) by mouth 2 (two) times daily.   ? [DISCONTINUED] fexofenadine (ALLEGRA) 180 MG tablet Take 180 mg by mouth daily as needed for allergies or rhinitis.   ? [DISCONTINUED] hydrALAZINE (APRESOLINE) 25 MG tablet Take 1 tablet (25 mg total) by mouth 3 (three) times daily.   ? [DISCONTINUED] losartan (COZAAR) 100 MG tablet TAKE 1 TABLET (100 MG TOTAL) BY MOUTH DAILY. (Patient taking differently: Take 100 mg by mouth daily.)   ? [DISCONTINUED] metFORMIN (GLUCOPHAGE) 1000 MG tablet TAKE 1  TABLET (1,000 MG TOTAL) BY MOUTH TWICE A DAY WITH A MEAL. (Patient taking differently: Take 1,000 mg by mouth 2 (two) times daily with a meal.)   ? [DISCONTINUED] sitaGLIPtin (JANUVIA) 50 MG tablet TAKE 1 TABLET (50 MG TOTAL) BY MOUTH DAILY. (Patient taking differently: Take 50 mg by mouth daily.)   ? ?No facility-administered encounter medications on file as of 09/05/2021.  ? ?Allergies  ?Allergen Reactions  ? Labetalol Other (See Comments)  ? Dilaudid [Hydromorphone Hcl] Hives  ? Morphine And Related Hives  ? Peanut-Containing Drug Products Hives  ? Strawberry Extract Swelling and Other (See Comments)  ?  Eyes swell   ? ?ROS: no fever, chills, URI symptoms.  +allergies, +itchy water eyes.. No recent asthma flare.  No n/v/d.  ?No chest pain, shortness of breath, edema, muscle cramps. ?Moods are good. ?No weight changes. ? ? ?PHYSICAL EXAM: ? ?BP (!) 130/92   Pulse 81   Ht 5' 4.75" (1.645 m)   Wt 288 lb 12.8 oz (131 kg)   BMI 48.43 kg/m?  ? ?Wt Readings from Last 3 Encounters:  ?09/05/21 288 lb 12.8 oz (131 kg)  ?04/24/21 285 lb (129.3 kg)  ?10/23/20 285 lb (129.3 kg)  ? ?Well-appearing, pleasant, obese female in  good spirits. ?HEENT: conjunctiva and sclera are clear, EOMI. OP clear. Sinuses nontender ?Neck: no lymphadenopathy, thyromegaly or carotid bruit ?Heart: regular rate and rhythm, no murmur ?Lungs: clear bilaterally, no

## 2021-09-05 ENCOUNTER — Encounter: Payer: Self-pay | Admitting: Family Medicine

## 2021-09-05 ENCOUNTER — Ambulatory Visit (INDEPENDENT_AMBULATORY_CARE_PROVIDER_SITE_OTHER): Payer: Managed Care, Other (non HMO) | Admitting: Family Medicine

## 2021-09-05 VITALS — BP 130/92 | HR 81 | Ht 64.75 in | Wt 288.8 lb

## 2021-09-05 DIAGNOSIS — E1169 Type 2 diabetes mellitus with other specified complication: Secondary | ICD-10-CM | POA: Diagnosis not present

## 2021-09-05 DIAGNOSIS — B3731 Acute candidiasis of vulva and vagina: Secondary | ICD-10-CM

## 2021-09-05 DIAGNOSIS — I152 Hypertension secondary to endocrine disorders: Secondary | ICD-10-CM

## 2021-09-05 DIAGNOSIS — E118 Type 2 diabetes mellitus with unspecified complications: Secondary | ICD-10-CM

## 2021-09-05 DIAGNOSIS — I10 Essential (primary) hypertension: Secondary | ICD-10-CM

## 2021-09-05 DIAGNOSIS — E785 Hyperlipidemia, unspecified: Secondary | ICD-10-CM

## 2021-09-05 DIAGNOSIS — J301 Allergic rhinitis due to pollen: Secondary | ICD-10-CM

## 2021-09-05 DIAGNOSIS — J45909 Unspecified asthma, uncomplicated: Secondary | ICD-10-CM

## 2021-09-05 DIAGNOSIS — E1159 Type 2 diabetes mellitus with other circulatory complications: Secondary | ICD-10-CM | POA: Diagnosis not present

## 2021-09-05 DIAGNOSIS — E559 Vitamin D deficiency, unspecified: Secondary | ICD-10-CM | POA: Diagnosis not present

## 2021-09-05 DIAGNOSIS — E119 Type 2 diabetes mellitus without complications: Secondary | ICD-10-CM

## 2021-09-05 LAB — POCT GLYCOSYLATED HEMOGLOBIN (HGB A1C): Hemoglobin A1C: 9.8 % — AB (ref 4.0–5.6)

## 2021-09-05 MED ORDER — FLUCONAZOLE 150 MG PO TABS: 150.0000 mg | ORAL_TABLET | Freq: Once | ORAL | 1 refills | Status: AC

## 2021-09-05 MED ORDER — RYBELSUS 7 MG PO TABS: 7.0000 mg | ORAL_TABLET | Freq: Every day | ORAL | 3 refills | Status: DC

## 2021-09-05 NOTE — Patient Instructions (Addendum)
Please monitor your blood pressures at home and record them.  Bring this list to any doctor visits. ?Bring your monitor to the visit with the hypertension clinic as well. ?We are referring you back to Dr. Blenda Mounts hypertension clinic. ?Goal is <130/90. ? ?We are increasing your Rybelsus dose to '7mg'$  daily. Be sure to take this 30 minutes prior to your first meal. ?Be sure to eat small quantities in order to minimize any nausea. ?You can double up on the 3 mg tablets.  It may not be able to be filled today if there is an insurance authorization required. ? ?Remember about Plan B if the condom should break, fall off, or if not used. ? ?Use the diflucan for yeast infection.  Repeat in 1 week.  You can keep the refill on hand if you have recurrent itching and swelling. If it never completely resolves after the 2 tablets, you can continue it weekly for another 2 weeks. ? ?Start the fluticasone nasal spray (flonase generic). ?You can also use eye drops for your itchy, watery eyes (over-the-counter). ?We are referring you to an allergist. ? ?

## 2021-09-06 ENCOUNTER — Encounter: Payer: Self-pay | Admitting: *Deleted

## 2021-09-06 ENCOUNTER — Telehealth: Payer: Self-pay | Admitting: Internal Medicine

## 2021-09-06 NOTE — Telephone Encounter (Signed)
P.A for Rybelsus '7mg'$  done through Cover my meds. Waiting on response ?

## 2021-09-06 NOTE — Telephone Encounter (Signed)
Rybelsus '7mg'$  has been approved until 09/06/2022. I have faxed approval to pharmacy and will notify patient through mychart ?

## 2021-09-11 ENCOUNTER — Ambulatory Visit (HOSPITAL_BASED_OUTPATIENT_CLINIC_OR_DEPARTMENT_OTHER): Payer: Self-pay | Admitting: Family
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
## 2021-09-12 ENCOUNTER — Encounter: Payer: Self-pay | Admitting: Family Medicine

## 2021-11-08 ENCOUNTER — Other Ambulatory Visit: Payer: Self-pay

## 2021-11-08 ENCOUNTER — Telehealth: Payer: Self-pay | Admitting: Family Medicine

## 2021-11-08 DIAGNOSIS — E119 Type 2 diabetes mellitus without complications: Secondary | ICD-10-CM

## 2021-11-08 MED ORDER — CARVEDILOL 25 MG PO TABS: ORAL_TABLET | Freq: Two times a day (BID) | ORAL | 0 refills | Status: DC

## 2021-11-08 MED ORDER — ATORVASTATIN CALCIUM 20 MG PO TABS: ORAL_TABLET | Freq: Every day | ORAL | 0 refills | Status: DC

## 2021-11-08 NOTE — Telephone Encounter (Signed)
As sent in for thirty days . Kh

## 2021-11-08 NOTE — Telephone Encounter (Signed)
Pt called and is requesting a refill on her lipitor and her coreg please send to the [facility] ([place] [place] - [address]

## 2021-11-09 ENCOUNTER — Encounter: Payer: Self-pay | Admitting: Cardiovascular Disease
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
## 2021-11-12 ENCOUNTER — Ambulatory Visit: Payer: Managed Care, Other (non HMO) | Admitting: Allergy

## 2021-12-05 ENCOUNTER — Encounter (HOSPITAL_COMMUNITY): Payer: Self-pay | Admitting: Emergency Medicine

## 2021-12-05 ENCOUNTER — Emergency Department (HOSPITAL_COMMUNITY)
Admission: EM | Admit: 2021-12-05 | Discharge: 2021-12-05 | Disposition: A | Discharge status: Home / Self Care | Payer: Commercial Managed Care - HMO | Attending: Emergency Medicine | Admitting: Emergency Medicine

## 2021-12-05 ENCOUNTER — Ambulatory Visit (HOSPITAL_COMMUNITY)
Admission: EM | Admit: 2021-12-05 | Discharge: 2021-12-05 | Disposition: A | Discharge status: Other Acute Inpatient Hospital | Payer: Commercial Managed Care - HMO | Attending: Internal Medicine | Admitting: Internal Medicine

## 2021-12-05 ENCOUNTER — Emergency Department (HOSPITAL_COMMUNITY): Payer: Commercial Managed Care - HMO

## 2021-12-05 ENCOUNTER — Other Ambulatory Visit: Payer: Self-pay

## 2021-12-05 DIAGNOSIS — R0602 Shortness of breath: Secondary | ICD-10-CM | POA: Diagnosis present

## 2021-12-05 DIAGNOSIS — R0682 Tachypnea, not elsewhere classified: Secondary | ICD-10-CM

## 2021-12-05 DIAGNOSIS — R079 Chest pain, unspecified: Secondary | ICD-10-CM | POA: Diagnosis not present

## 2021-12-05 DIAGNOSIS — R7309 Other abnormal glucose: Secondary | ICD-10-CM | POA: Diagnosis not present

## 2021-12-05 DIAGNOSIS — Z7951 Long term (current) use of inhaled steroids: Secondary | ICD-10-CM | POA: Diagnosis not present

## 2021-12-05 DIAGNOSIS — J45901 Unspecified asthma with (acute) exacerbation: Secondary | ICD-10-CM | POA: Insufficient documentation

## 2021-12-05 DIAGNOSIS — R0789 Other chest pain: Secondary | ICD-10-CM | POA: Insufficient documentation

## 2021-12-05 DIAGNOSIS — Z79899 Other long term (current) drug therapy: Secondary | ICD-10-CM | POA: Insufficient documentation

## 2021-12-05 DIAGNOSIS — Z91010 Allergy to peanuts: Secondary | ICD-10-CM | POA: Diagnosis not present

## 2021-12-05 DIAGNOSIS — I10 Essential (primary) hypertension: Secondary | ICD-10-CM | POA: Insufficient documentation

## 2021-12-05 LAB — BASIC METABOLIC PANEL
Anion gap: 9 (ref 5–15)
BUN: 9 mg/dL (ref 6–20)
CO2: 21 mmol/L — ABNORMAL LOW (ref 22–32)
Calcium: 8.8 mg/dL — ABNORMAL LOW (ref 8.9–10.3)
Chloride: 106 mmol/L (ref 98–111)
Creatinine, Ser: 0.76 mg/dL (ref 0.44–1.00)
GFR, Estimated: >60 mL/min (ref >60)
Glucose, Bld: 275 mg/dL — ABNORMAL HIGH (ref 70–99)
Potassium: 3.9 mmol/L (ref 3.5–5.1)
Sodium: 136 mmol/L (ref 135–145)

## 2021-12-05 LAB — CBC WITH DIFFERENTIAL/PLATELET
Abs Immature Granulocytes: 0.02 10*3/uL (ref 0.00–0.07)
Basophils Absolute: 0 10*3/uL (ref 0.0–0.1)
Basophils Relative: 0 %
Eosinophils Absolute: 0.2 10*3/uL (ref 0.0–0.5)
Eosinophils Relative: 2 %
HCT: 31.3 % — ABNORMAL LOW (ref 36.0–46.0)
Hemoglobin: 10 g/dL — ABNORMAL LOW (ref 12.0–15.0)
Immature Granulocytes: 0 %
Lymphocytes Relative: 41 %
Lymphs Abs: 2.9 10*3/uL (ref 0.7–4.0)
MCH: 23.7 pg — ABNORMAL LOW (ref 26.0–34.0)
MCHC: 31.9 g/dL (ref 30.0–36.0)
MCV: 74.2 fL — ABNORMAL LOW (ref 80.0–100.0)
Monocytes Absolute: 0.6 10*3/uL (ref 0.1–1.0)
Monocytes Relative: 8 %
Neutro Abs: 3.4 10*3/uL (ref 1.7–7.7)
Neutrophils Relative %: 49 %
Platelets: 320 10*3/uL (ref 150–400)
RBC: 4.22 MIL/uL (ref 3.87–5.11)
RDW: 14.6 % (ref 11.5–15.5)
WBC: 7.1 10*3/uL (ref 4.0–10.5)
nRBC: 0 % (ref 0.0–0.2)

## 2021-12-05 LAB — CBG MONITORING, ED: Glucose-Capillary: 245 mg/dL — ABNORMAL HIGH (ref 70–99)

## 2021-12-05 LAB — TROPONIN I (HIGH SENSITIVITY): Troponin I (High Sensitivity): 4 ng/L (ref <18)

## 2021-12-05 LAB — BRAIN NATRIURETIC PEPTIDE: B Natriuretic Peptide: 20.2 pg/mL (ref 0.0–100.0)

## 2021-12-05 LAB — D-DIMER, QUANTITATIVE: D-Dimer, Quant: 0.43 ug/mL-FEU (ref 0.00–0.50)

## 2021-12-05 MED ORDER — ALBUTEROL SULFATE (2.5 MG/3ML) 0.083% IN NEBU
INHALATION_SOLUTION | RESPIRATORY_TRACT | Status: AC
  Filled 2021-12-05: qty 3

## 2021-12-05 MED ORDER — ALBUTEROL SULFATE (2.5 MG/3ML) 0.083% IN NEBU
2.5000 mg | INHALATION_SOLUTION | Freq: Once | RESPIRATORY_TRACT | Status: AC
  Administered 2021-12-05: 2.5 mg via RESPIRATORY_TRACT

## 2021-12-05 MED ORDER — ALBUTEROL SULFATE HFA 108 (90 BASE) MCG/ACT IN AERS
2.0000 | INHALATION_SPRAY | RESPIRATORY_TRACT | Status: DC | PRN
  Administered 2021-12-05: 2 via RESPIRATORY_TRACT
  Filled 2021-12-05: qty 6.7

## 2021-12-05 NOTE — ED Notes (Signed)
Patient is being discharged from the Urgent Care and sent to the Emergency Department via [facility] . Per Sharyn Lull FNP, patient is in need of higher level of care due to SOB, Medical History. Patient is aware and verbalizes understanding of plan of care.  Vitals:   12/05/21 1955  BP: (!) 159/111  Pulse: 80  Resp: (!) 30  Temp: 98 F (36.7 C)  SpO2: 100%
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
## 2021-12-05 NOTE — ED Triage Notes (Signed)
Patient c/o SOB that started this morning.   Patient endorses chest tightness.   Patient endorses chest pain with inhalation.   Patient has history of asthma.   Patient has used albuterol inhaler with no relief of symptoms.

## 2021-12-05 NOTE — ED Notes (Signed)
Patient verbalizes understanding of d/c instructions. Opportunities for questions and answers were provided. Pt d/c from ED and ambulated to lobby.  

## 2021-12-05 NOTE — ED Notes (Signed)
Patient is being discharged from the Urgent Care and sent to the Emergency Department via [place] . Per Margie Billet, patient is in need of higher level of care due to SOB. Patient is aware and verbalizes understanding of plan of care.  Vitals:   12/05/21 1955  BP: (!) 159/111  Pulse: 80  Resp: (!) 30  Temp: 98 F (36.7 C)  SpO2: 100%

## 2021-12-05 NOTE — ED Triage Notes (Addendum)
Pt BIB [facility] from UC c/o SOB and central CP since 10am today. Per pt she used inhaler this morning with no improvement. Pt on 2L [place] for comfort. EMS noted lungs diminished on right side. VSS, AOx4 HX- asthma, HTN, DM

## 2021-12-05 NOTE — Discharge Instructions (Signed)
You have been evaluated for your symptoms.  Fortunately your chest x-ray and your blood work today did not show any concerning finding.  You may use albuterol inhaler 2 puffs every 4 hours as needed for shortness of breath.  Follow-up with your doctor for further care.  Return to the ER if your symptoms worsen or if you have any other concern.

## 2021-12-05 NOTE — ED Provider Notes (Signed)
[facility] EMERGENCY DEPARTMENT Provider Note   CSN: [id number] Arrival date & time: 12/05/21  2041     History  Chief Complaint  Patient presents with   Shortness of Breath   Chest Pain    Victoria [name] is a 41 y.o. female.  The history is provided by the patient and medical records. No language interpreter was used.  Shortness of Breath Associated symptoms: chest pain   Chest Pain Associated symptoms: shortness of breath     41 year old female significant history of morbid obesity, obstructive sleep apnea, asthma, hypertension, brought here via EMS from urgent care center for evaluation of shortness of breath.  Patient report she works night shift and as she was driving home this morning she developed acute onset of shortness of breath.  She described more as a tightness sensation in her chest.  She tries using rescue inhaler with minimal improvement.  She went home and use her nephews rescue inhaler multiple times but noticed no improvement thus prompting her urgent care visit.  She was sent here for further assessment.  She did state her symptoms felt somewhat like the time she was diagnosed with pericarditis and was hospitalized for a week.  She denies any significant chest pain just chest tightness.  She does not complain of any fever or chills no productive cough no nausea vomiting diarrhea no wheezing.  Denies any prior history of PE or DVT but does admits to long drive on a regular basis as she has to commute from here to [place] for her job.  She denies any leg swelling or calf pain.  No history of tobacco use.  Home Medications Prior to Admission medications   Medication Sig Start Date End Date Taking? Authorizing Provider  acetaminophen (TYLENOL) 500 MG tablet Take 1,000 mg by mouth as needed for moderate pain.    [provider]  albuterol (PROVENTIL) (2.5 MG/3ML) 0.083% nebulizer solution Take 2.5 mg by nebulization as needed for wheezing  or shortness of breath. Patient not taking: Reported on 09/05/2021    [provider]  albuterol (VENTOLIN HFA) 108 (90 Base) MCG/ACT inhaler INHALE 1-2 PUFFS INTO THE LUNGS EVERY 6 (SIX) HOURS AS NEEDED FOR WHEEZING OR SHORTNESS OF BREATH. 04/03/20 12/05/21  Providence Lanius A, PA-C  amLODipine (NORVASC) 5 MG tablet Take 1 tablet (5 mg total) by mouth daily. 04/24/21 09/05/21  Dorie Rank, MD  amoxicillin-clavulanate (AUGMENTIN) 875-125 MG tablet Take 1 tablet by mouth 2 (two) times daily. 08/26/21   Evelina Dun A, FNP  atorvastatin (LIPITOR) 20 MG tablet TAKE 1 TABLET (20 MG TOTAL) BY MOUTH DAILY. 11/08/21 11/08/22  Rita Ohara, MD  carvedilol (COREG) 25 MG tablet TAKE 1 TABLET (25 MG TOTAL) BY MOUTH 2 (TWO) TIMES DAILY. 11/08/21 11/08/22  Rita Ohara, MD  cetirizine (ZYRTEC) 10 MG tablet Take 10 mg by mouth daily.    [provider]  Cholecalciferol (VITAMIN D) 50 MCG (2000 UT) CAPS Take 1 capsule by mouth daily.    [provider]  Continuous Blood Gluc Sensor (FREESTYLE LIBRE 3 SENSOR) MISC SMARTSIG:1 Topical Every 10 Days 08/27/21   [provider]  fluticasone (FLONASE) 50 MCG/ACT nasal spray Place 2 sprays into both nostrils daily. Patient not taking: Reported on 09/05/2021 09/17/20   Evelina Dun A, FNP  ibuprofen (ADVIL) 800 MG tablet Take 1 tablet (800 mg total) by mouth 3 (three) times daily as needed. 02/12/19   Zigmund Gottron, NP  Semaglutide (RYBELSUS) 7 MG TABS  Take 7 mg by mouth daily. 09/05/21   Rita Ohara, MD  spironolactone (ALDACTONE) 25 MG tablet TAKE 1 TABLET (25 MG TOTAL) BY MOUTH DAILY. Patient taking differently: Take 25 mg by mouth daily. 08/17/20 09/05/21  Rita Ohara, MD  valsartan (DIOVAN) 320 MG tablet Take 320 mg by mouth daily. 07/27/21   [provider]  XIGDUO XR 02-999 MG TB24 Take 1 tablet by mouth daily. 06/01/21   [provider]      Allergies    Labetalol, Dilaudid [hydromorphone hcl], Morphine and related,  Peanut-containing drug products, and Strawberry extract    Review of Systems   Review of Systems  Respiratory:  Positive for shortness of breath.   Cardiovascular:  Positive for chest pain.  All other systems reviewed and are negative.   Physical Exam Updated Vital Signs BP (!) 169/104 (BP Location: Left Arm)   Pulse 71   Temp 98.1 F (36.7 C) (Oral)   Resp (!) 26   Ht '5\' 4"'$  (1.626 m)   Wt 129.7 kg   LMP  (LMP Unknown)   SpO2 100%   BMI 49.09 kg/m  Physical Exam Vitals and nursing note reviewed.  Constitutional:      General: She is not in acute distress.    Appearance: She is well-developed. She is obese.  HENT:     Head: Atraumatic.  Eyes:     Conjunctiva/sclera: Conjunctivae normal.  Cardiovascular:     Rate and Rhythm: Normal rate and regular rhythm.  Pulmonary:     Effort: Pulmonary effort is normal. Tachypnea present.     Breath sounds: No decreased breath sounds, wheezing, rhonchi or rales.  Chest:     Chest wall: No tenderness.  Musculoskeletal:     Cervical back: Neck supple.     Right lower leg: No edema.     Left lower leg: No edema.  Skin:    Findings: No rash.  Neurological:     Mental Status: She is alert.  Psychiatric:        Mood and Affect: Mood normal.     ED Results / Procedures / Treatments   Labs (all labs ordered are listed, but only abnormal results are displayed) Labs Reviewed  BASIC METABOLIC PANEL - Abnormal; Notable for the following components:      Result Value   CO2 21 (*)    Glucose, Bld 275 (*)    Calcium 8.8 (*)    All other components within normal limits  CBC WITH DIFFERENTIAL/PLATELET - Abnormal; Notable for the following components:   Hemoglobin 10.0 (*)    HCT 31.3 (*)    MCV 74.2 (*)    MCH 23.7 (*)    All other components within normal limits  CBG MONITORING, ED - Abnormal; Notable for the following components:   Glucose-Capillary 245 (*)    All other components within normal limits  BRAIN NATRIURETIC  PEPTIDE  D-DIMER, QUANTITATIVE  TROPONIN I (HIGH SENSITIVITY)  TROPONIN I (HIGH SENSITIVITY)    EKG None  Date: 12/05/2021  Rate: 67  Rhythm: normal sinus rhythm  QRS Axis: normal  Intervals: normal  ST/T Wave abnormalities: normal  Conduction Disutrbances: none  Narrative Interpretation:   Old EKG Reviewed: No significant changes noted   Radiology DG Chest 2 View  Result Date: 12/05/2021 CLINICAL DATA:  Shortness of breath EXAM: CHEST - 2 VIEW COMPARISON:  01/21/2021 FINDINGS: Heart and mediastinal contours are within normal limits. No focal opacities or effusions. No acute bony abnormality. IMPRESSION:  No active cardiopulmonary disease. Electronically Signed   By: Rolm Baptise M.D.   On: 12/05/2021 21:25    Procedures Procedures    Medications Ordered in ED Medications  albuterol (VENTOLIN HFA) 108 (90 Base) MCG/ACT inhaler 2 puff (has no administration in time range)    ED Course/ Medical Decision Making/ A&P                           Medical Decision Making Amount and/or Complexity of Data Reviewed Labs: ordered. Radiology: ordered.  Risk Prescription drug management.   BP (!) 169/104 (BP Location: Left Arm)   Pulse 71   Temp 98.1 F (36.7 C) (Oral)   Resp (!) 26   Ht '5\' 4"'$  (1.626 m)   Wt 129.7 kg   LMP  (LMP Unknown)   SpO2 100%   BMI 49.09 kg/m   9:11 PM This is a 41 year old female significant history of asthma and obstructive sleep apnea sent here from urgent care center for evaluation of shortness of breath.  Patient developed acute onset of shortness of breath earlier today which she initially thought could be due to an asthma exacerbation.  She normally does not have problems with her asthma and use her rescue inhaler on occasion.  She did try using her rescue inhaler without minimal improvement.  She went home, use nephews rescue inhaler and also noticed no improvement.  She went to urgent care center but sent here for further assessment.  She did  report her symptoms felt somewhat similar to prior bouts of questionable pericarditis that she had back in 2019.  She has never had a history of PE DVT but did report a regular long trip travel due to a job.  She denies any calf pain or leg swelling.  On exam, she is tachypneic however not tachycardic and her lungs are clear on auscultation no wheezes heard.  No significant peripheral edema.  She is afebrile and no hypoxia.  Work-up initiated.  I have reviewed patient's EMR record including labs and imaging and cardiac echocardiogram back in 2019 which did not show any concerning finding.  10:48 PM Labs, EKG, and imaging independently viewed interpreted by me and I agree with radiologist interpretation.  Patient's work-up today has been reassuring.  She has negative D-dimer therefore I have low suspicion for PE.  Her BNP is normal, low suspicion for CHF exacerbation, chest x-ray without anyf ocal infiltrate no pleural effusion and no concerning finding.  No signs of pneumonia, No collapsed lung. Elevated CBG of 245 without n anion gap concerning for DKA.  At this time patient is resting comfortably.  Blood pressure is elevated at 178/121.  We will recheck.  Patient does not have any symptoms wheezes on exam however she does have history of asthma and ran out of her rescue inhaler therefore I will provide a an albuterol rescue inhaler to have on hand.  11:17 PM Normal delta trop. Pt with low HEAR risk score for MACE.  Doubt ACS, Doubt PE.  Stable to be discharge with outpt f/u.  I have considered pt's social determinant of heath which includes stress vulnerability in my plan of care.  I also recommend pt to have her BP recheck as it is elevated today.  Return precaution given.  I have considered admission but in the setting of a normal work up, no hypoxia and pt in no acute discomfort she is stable to be discharge.  Patient able  to ambulate without any hypoxia.          Final Clinical  Impression(s) / ED Diagnoses Final diagnoses:  Shortness of breath    Rx / DC Orders ED Discharge Orders     None         Domenic Moras, PA-C 12/05/21 2323    Regan Lemming, MD 12/05/21 2348

## 2021-12-05 NOTE — ED Notes (Signed)
Patient transported to X-ray 

## 2021-12-06 ENCOUNTER — Other Ambulatory Visit: Payer: Self-pay | Admitting: Family Medicine

## 2021-12-06 ENCOUNTER — Telehealth: Payer: Self-pay | Admitting: Family Medicine

## 2021-12-06 DIAGNOSIS — E119 Type 2 diabetes mellitus without complications: Secondary | ICD-10-CM

## 2021-12-06 LAB — TROPONIN I (HIGH SENSITIVITY): Troponin I (High Sensitivity): 3 ng/L (ref <18)

## 2021-12-06 NOTE — Telephone Encounter (Signed)
Pt was called concerning recent ER visit. Pt states that she is doing better. Pt was advised follow up was recommended with Dr. Tomi Bamberger. Pt was offered an appt for this Monday. Pt advised she will be out of town next week and states that she already has an appt for the following Monday, 12/17/2021. Pt was advised that additional time would be added to address ER follow up. Appt time changed to 45 minutes. PT was advised if she needed to be seen prior to going out of town we had other providers available to see her. PT verbalized understanding.

## 2021-12-07 NOTE — Telephone Encounter (Signed)
Pt has upcoming appt with Dr. Tomi Bamberger was filled back on 6/15 for 30 days.

## 2021-12-07 NOTE — Telephone Encounter (Signed)
Pt does need a refill on this as she is out

## 2021-12-08 NOTE — Telephone Encounter (Signed)
Her blood pressure and glucose were very high at the ER visit.  Please be sure that she brings lists of her sugars and her BP's to her visit.  When she was here in April, I had advised her to f/u with Dr. [facility].  I do not see that she has had an appointment there (and none scheduled).  Please remind her to schedule this.

## 2021-12-17 ENCOUNTER — Encounter: Payer: Managed Care, Other (non HMO) | Admitting: Family Medicine

## 2021-12-22 ENCOUNTER — Emergency Department (HOSPITAL_BASED_OUTPATIENT_CLINIC_OR_DEPARTMENT_OTHER): Payer: Commercial Managed Care - HMO | Admitting: Radiology

## 2021-12-22 ENCOUNTER — Encounter (HOSPITAL_BASED_OUTPATIENT_CLINIC_OR_DEPARTMENT_OTHER): Payer: Self-pay

## 2021-12-22 ENCOUNTER — Emergency Department (HOSPITAL_BASED_OUTPATIENT_CLINIC_OR_DEPARTMENT_OTHER): Payer: Commercial Managed Care - HMO

## 2021-12-22 ENCOUNTER — Emergency Department (HOSPITAL_BASED_OUTPATIENT_CLINIC_OR_DEPARTMENT_OTHER)
Admission: EM | Admit: 2021-12-22 | Discharge: 2021-12-23 | Disposition: A | Discharge status: Home / Self Care | Payer: Commercial Managed Care - HMO | Attending: Emergency Medicine | Admitting: Emergency Medicine

## 2021-12-22 DIAGNOSIS — I10 Essential (primary) hypertension: Secondary | ICD-10-CM | POA: Diagnosis not present

## 2021-12-22 DIAGNOSIS — Z79899 Other long term (current) drug therapy: Secondary | ICD-10-CM | POA: Diagnosis not present

## 2021-12-22 DIAGNOSIS — R109 Unspecified abdominal pain: Secondary | ICD-10-CM | POA: Insufficient documentation

## 2021-12-22 DIAGNOSIS — Z91010 Allergy to peanuts: Secondary | ICD-10-CM | POA: Insufficient documentation

## 2021-12-22 DIAGNOSIS — D649 Anemia, unspecified: Secondary | ICD-10-CM

## 2021-12-22 LAB — URINALYSIS, ROUTINE W REFLEX MICROSCOPIC
Bilirubin Urine: NEGATIVE
Glucose, UA: 250 mg/dL — AB
Ketones, ur: NEGATIVE mg/dL
Nitrite: NEGATIVE
RBC / HPF: >50 RBC/hpf — ABNORMAL HIGH (ref 0–5)
Specific Gravity, Urine: 1.023 (ref 1.005–1.030)
pH: 5.5 (ref 5.0–8.0)

## 2021-12-22 LAB — PREGNANCY, URINE: Preg Test, Ur: NEGATIVE

## 2021-12-22 MED ORDER — AMLODIPINE BESYLATE 5 MG PO TABS
5.0000 mg | ORAL_TABLET | Freq: Once | ORAL | Status: DC
  Filled 2021-12-22: qty 1

## 2021-12-22 MED ORDER — SPIRONOLACTONE 25 MG PO TABS
25.0000 mg | ORAL_TABLET | Freq: Every day | ORAL | Status: DC
  Administered 2021-12-22: 25 mg via ORAL
  Filled 2021-12-22: qty 1

## 2021-12-22 MED ORDER — KETOROLAC TROMETHAMINE 30 MG/ML IJ SOLN
30.0000 mg | Freq: Once | INTRAMUSCULAR | Status: AC
  Administered 2021-12-22: 30 mg via INTRAMUSCULAR

## 2021-12-22 NOTE — ED Provider Notes (Signed)
[facility] EMERGENCY DEPT Provider Note   CSN: [id number] Arrival date & time: 12/22/21  1930     History  Chief Complaint  Patient presents with   Back Pain    Victoria [name] is a 41 y.o. female.   Back Pain   Patient presents ED with complaints of left flank pain.  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  Patient started developing pain in the left flank area that radiates around the front.  She has tried ibuprofen and Tylenol without relief.  Patient denies any dysuria.  No abdominal pain.  Patient also has a history of hypertension but has not taken her blood pressure medications this morning.  Home Medications Prior to Admission medications   Medication Sig Start Date End Date Taking? Authorizing Provider  acetaminophen (TYLENOL) 500 MG tablet Take 1,000 mg by mouth as needed for moderate pain.    [provider]  albuterol (PROVENTIL) (2.5 MG/3ML) 0.083% nebulizer solution Take 2.5 mg by nebulization as needed for wheezing or shortness of breath. Patient not taking: Reported on 09/05/2021    [provider]  albuterol (VENTOLIN HFA) 108 (90 Base) MCG/ACT inhaler INHALE 1-2 PUFFS INTO THE LUNGS EVERY 6 (SIX) HOURS AS NEEDED FOR WHEEZING OR SHORTNESS OF BREATH. 04/03/20 12/05/21  Providence Lanius A, PA-C  amLODipine (NORVASC) 5 MG tablet Take 1 tablet (5 mg total) by mouth daily. 04/24/21 09/05/21  Dorie Rank, MD  amoxicillin-clavulanate (AUGMENTIN) 875-125 MG tablet Take 1 tablet by mouth 2 (two) times daily. 08/26/21   Sharion Balloon, FNP  atorvastatin (LIPITOR) 20 MG tablet Take 1 tablet by mouth once daily 12/07/21   Rita Ohara, MD  carvedilol (COREG) 25 MG tablet Take 1 tablet by mouth twice daily 12/07/21   Rita Ohara, MD  cetirizine (ZYRTEC) 10 MG tablet Take 10 mg by mouth daily.    [provider]  Cholecalciferol (VITAMIN D) 50 MCG (2000 UT) CAPS Take 1 capsule by mouth daily.    [provider]  Continuous Blood Gluc Sensor (FREESTYLE LIBRE 3 SENSOR) MISC SMARTSIG:1 Topical Every 10 Days 08/27/21   [provider]  fluticasone (FLONASE) 50 MCG/ACT nasal spray Place 2 sprays into both nostrils daily. Patient not taking: Reported on 09/05/2021 09/17/20   Evelina Dun A, FNP  ibuprofen (ADVIL) 800 MG tablet Take 1 tablet (800 mg total) by mouth 3 (three) times daily as needed. 02/12/19   Zigmund Gottron, NP  Semaglutide (RYBELSUS) 7 MG TABS Take 7 mg by mouth daily. 09/05/21   Rita Ohara, MD  spironolactone (ALDACTONE) 25 MG tablet TAKE 1 TABLET (25 MG TOTAL) BY MOUTH DAILY. Patient taking differently: Take 25 mg by mouth daily. 08/17/20 09/05/21  Rita Ohara, MD  valsartan (DIOVAN) 320 MG tablet Take 320 mg by mouth daily. 07/27/21   [provider]  XIGDUO XR 02-999 MG TB24 Take 1 tablet by mouth daily. 06/01/21   [provider]      Allergies    Labetalol, Dilaudid [hydromorphone hcl], Morphine and related, Peanut-containing drug products, and Strawberry extract    Review of Systems   Review of Systems  Musculoskeletal:  Positive for back pain.    Physical Exam Updated Vital Signs BP (!) 160/101 (BP Location: Right Arm)   Pulse 81   Temp 98.4 F (36.9 C) (Oral)   Resp 18   Ht 1.626 m ('5\' 4"'$ )   Wt 130.6 kg   LMP 12/19/2021 (Exact  Date)   SpO2 100%   BMI 49.44 kg/m  Physical Exam Vitals and nursing note reviewed.  Constitutional:      General: She is not in acute distress.    Appearance: She is well-developed.  HENT:     Head: Normocephalic and atraumatic.     Right Ear: External ear normal.     Left Ear: External ear normal.  Eyes:     General: No scleral icterus.       Right eye: No discharge.        Left eye: No discharge.     Conjunctiva/sclera: Conjunctivae normal.  Neck:     Trachea: No tracheal deviation.  Cardiovascular:     Rate and Rhythm: Normal rate.  Pulmonary:     Effort: Pulmonary effort is normal. No  respiratory distress.     Breath sounds: No stridor.  Abdominal:     General: There is no distension.     Palpations: There is no mass.     Tenderness: There is no abdominal tenderness. There is left CVA tenderness.  Musculoskeletal:        General: Tenderness present. No swelling or deformity.     Cervical back: Neck supple.     Comments: Tenderness palpation left lower posterior lateral and anterior rib  Skin:    General: Skin is warm and dry.     Findings: No rash.  Neurological:     General: No focal deficit present.     Mental Status: She is alert.     Cranial Nerves: Cranial nerve deficit: no gross deficits.     ED Results / Procedures / Treatments   Labs (all labs ordered are listed, but only abnormal results are displayed) Labs Reviewed  URINALYSIS, ROUTINE W REFLEX MICROSCOPIC - Abnormal; Notable for the following components:      Result Value   Glucose, UA 250 (*)    Hgb urine dipstick LARGE (*)    Protein, ur TRACE (*)    Leukocytes,Ua LARGE (*)    RBC / HPF >50 (*)    Bacteria, UA FEW (*)    All other components within normal limits  PREGNANCY, URINE  CBC  BASIC METABOLIC PANEL    EKG None  Radiology DG Ribs Unilateral W/Chest Left  Result Date: 12/22/2021 CLINICAL DATA:  Pain, injury. EXAM: LEFT RIBS AND CHEST - 3+ VIEW COMPARISON:  Chest x-ray 12/05/2021 FINDINGS: No fracture or other bone lesions are seen involving the ribs. There is no evidence of pneumothorax or pleural effusion. Both lungs are clear. Heart size and mediastinal contours are within normal limits. IMPRESSION: Negative. Electronically Signed   By: Ronney Asters M.D.   On: 12/22/2021 23:19    Procedures Procedures    Medications Ordered in ED Medications  amLODipine (NORVASC) tablet 5 mg (5 mg Oral Not Given 12/22/21 2318)  spironolactone (ALDACTONE) tablet 25 mg (25 mg Oral Given 12/22/21 2322)  ketorolac (TORADOL) 30 MG/ML injection 30 mg (30 mg Intramuscular Given 12/22/21 2300)     ED Course/ Medical Decision Making/ A&P Clinical Course as of 12/22/21 2334  Sat Dec 22, 2021  2323 DG Ribs Unilateral W/Chest Left Rib x-ray without acute findings [JK]  2323 Urinalysis, Routine w reflex microscopic Urine, Clean Catch(!) Urinalysis does show large amount of blood and white blood cells [JK]  2327 Pregnancy, urine Negative [JK]    Clinical Course User Index [JK] Dorie Rank, MD  Medical Decision Making Problems Addressed: Flank pain: acute illness or injury that poses a threat to life or bodily functions Hypertension, unspecified type: chronic illness or injury with exacerbation, progression, or side effects of treatment  Amount and/or Complexity of Data Reviewed Labs: ordered. Decision-making details documented in ED Course. Radiology: ordered. Decision-making details documented in ED Course.  Risk Prescription drug management.   Patient presents with acute flank pain.  Patient thinks he may have pulled a muscle while she was at work with a combative patient however the pain is increased in intensity and its sharp rating from the left flank towards the front.  Is concerned about the possibility of renal colic so urinalysis was ordered.  It does show hematuria and some white blood cells.  Patient is on her menstrual period I am concerned about the possibility of renal colic so we will proceed with CT scan.  Patient was also noted to be hypertensive had not taken her medications.  I have ordered a dose of her medications while she is here.  CT scan CBC metabolic panel are pending.  Care turned over to Dr. Karle Starch        Final Clinical Impression(s) / ED Diagnoses Final diagnoses:  Flank pain  Hypertension, unspecified type    Rx / DC Orders ED Discharge Orders     None         Dorie Rank, MD 12/22/21 2334

## 2021-12-22 NOTE — ED Triage Notes (Signed)
Pt presents to the ED with left lower back pain that started after an injury at work on Tuesday. States that she has been trying to take ibuprofen and tylenol for the pain without relief. 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   Pt reports 5/10 pain. Has not taken pain medications since this morning at 1000.   Pt A&Ox4 at time of triage. Hx of hypertension and reports not taking her BP medication this morning. BP at time of triage 174/112.   Pt ambulatory to exam room.

## 2021-12-23 LAB — CBC
HCT: 29.9 % — ABNORMAL LOW (ref 36.0–46.0)
Hemoglobin: 9.4 g/dL — ABNORMAL LOW (ref 12.0–15.0)
MCH: 23.6 pg — ABNORMAL LOW (ref 26.0–34.0)
MCHC: 31.4 g/dL (ref 30.0–36.0)
MCV: 75.1 fL — ABNORMAL LOW (ref 80.0–100.0)
Platelets: 311 10*3/uL (ref 150–400)
RBC: 3.98 MIL/uL (ref 3.87–5.11)
RDW: 14.6 % (ref 11.5–15.5)
WBC: 7.7 10*3/uL (ref 4.0–10.5)
nRBC: 0 % (ref 0.0–0.2)

## 2021-12-23 LAB — BASIC METABOLIC PANEL
Anion gap: 7 (ref 5–15)
BUN: 10 mg/dL (ref 6–20)
CO2: 26 mmol/L (ref 22–32)
Calcium: 8.7 mg/dL — ABNORMAL LOW (ref 8.9–10.3)
Chloride: 106 mmol/L (ref 98–111)
Creatinine, Ser: 0.75 mg/dL (ref 0.44–1.00)
GFR, Estimated: >60 mL/min (ref >60)
Glucose, Bld: 177 mg/dL — ABNORMAL HIGH (ref 70–99)
Potassium: 3.5 mmol/L (ref 3.5–5.1)
Sodium: 139 mmol/L (ref 135–145)

## 2021-12-23 MED ORDER — NAPROXEN 500 MG PO TABS: 500.0000 mg | ORAL_TABLET | Freq: Two times a day (BID) | ORAL | 0 refills | Status: DC

## 2021-12-23 MED ORDER — NAPROXEN 500 MG PO TABS
500.0000 mg | ORAL_TABLET | Freq: Two times a day (BID) | ORAL | 0 refills | Status: DC
  Filled 2021-12-23: qty 30, 15d supply, fill #0

## 2021-12-23 MED ORDER — METHOCARBAMOL 500 MG PO TABS
500.0000 mg | ORAL_TABLET | Freq: Two times a day (BID) | ORAL | 0 refills | Status: DC
  Filled 2021-12-23: qty 20, 10d supply, fill #0

## 2021-12-23 MED ORDER — METHOCARBAMOL 500 MG PO TABS: 500.0000 mg | ORAL_TABLET | Freq: Two times a day (BID) | ORAL | 0 refills | Status: DC

## 2021-12-23 NOTE — ED Provider Notes (Signed)
Care of the patient assumed at the change of shift. Here for flank pain, MSK vs renal colic. UA with blood but also on her menses. Awaiting CT and labs.  Physical Exam  BP (!) 160/101 (BP Location: Right Arm)   Pulse 81   Temp 98.4 F (36.9 C) (Oral)   Resp 18   Ht '5\' 4"'$  (1.626 m)   Wt 130.6 kg   LMP 12/19/2021 (Exact Date)   SpO2 100%   BMI 49.44 kg/m   Physical Exam  Procedures  Procedures  ED Course / MDM   Clinical Course as of 12/23/21 0048  Sat Dec 22, 2021  2323 DG Ribs Unilateral W/Chest Left Rib x-ray without acute findings [JK]  2323 Urinalysis, Routine w reflex microscopic Urine, Clean Catch(!) Urinalysis does show large amount of blood and white blood cells [JK]  2327 Pregnancy, urine Negative [JK]  Sun Dec 23, 2021  0001 I personally viewed the images from radiology studies and agree with radiologist interpretation: CT neg for renal stones  [CS]  0011 CBC with anemia, consistent with previous.  [CS]  7121 BMP is unremarkable. [CS]  [id number] Discussed results with patient including anemia which she was unaware of. She reports heavy menses, recommend she begin MVI with iron, follow up with PCP for further management. Pain is likely MSK. Plan Rx for NSAID, robaxin and PCP follow up.  [CS]    Clinical Course User Index [CS] Truddie Hidden, MD [JK] Dorie Rank, MD   Medical Decision Making Problems Addressed: Anemia, unspecified type: chronic illness or injury Flank pain: acute illness or injury Hypertension, unspecified type: chronic illness or injury  Amount and/or Complexity of Data Reviewed Labs: ordered. Decision-making details documented in ED Course. Radiology: ordered and independent interpretation performed. Decision-making details documented in ED Course.  Risk Prescription drug management.          Truddie Hidden, MD 12/23/21 [phone]

## 2021-12-24 ENCOUNTER — Other Ambulatory Visit (HOSPITAL_COMMUNITY): Payer: Self-pay

## 2021-12-26 NOTE — Patient Instructions (Incomplete)
You are due again for your diabetic eye exam. Please schedule this and have them send [place] the report.  We referred you back to Dr. [facility] hypertension clinic at your visit in April.  Please call them and schedule an appointment.  It is very important for the health of your kidneys (and prevention of stroke, heart disease, etc) to get your blood pressure under good control. Cutting back on the lunch meats, fast food, chips, fries, etc (low sodium diet) should help improve your blood pressures) Hopefully, with your new job, you can work in 30 minutes of exercise daily, that will also help with sugar and blood pressure, and reduce cardiovascular risk.  We referred you to the allergist in April as well.  After leaving 3 messages to try and schedule you, they closed the referral.  This is recommended due to the frequency of ER visits in the past related to wheezing, shortness of breath.  They left you messages.  If/when you plan to call them back, and if another referral is needed (not sure that it is), let us know and we can enter one again.  Please be sure to get your flu shot next month, when available. I also recommend getting the updated bivalent COVID vaccine when it becomes available in the Fall.  Please schedule a visit with an OB-GYN who takes your insurance.  You are due for an annual exam and also need to address your worsening anemia. This is likely related to your heavy cycles. Start taking iron '325mg'$  twice daily with food.  You may want to take colace (stool softener) along with it to help prevent constipation. You may use miralax as needed to help with constipation.  You can take ibuprofen 600-800 mg (3-4 tablets of '200mg'$  at once), WITH FOOD, three times daily until your pain is better.  Cut back on the dose if it bothers your stomach.  Use heat, stretches, massage. You can use topical salonpas with lidocaine.

## 2021-12-26 NOTE — Progress Notes (Unsigned)
No chief complaint on file.   Patient presents for 3 month follow-up on chronic problems.   Since her last visit, in the last few weeks, she has been to the ER twice. 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  Work-up was normal (negative renal CT, rib x-rays), and felt to be MSK etiology. She was prescribed naproxen and methocarbamol. She was noted to have worsening anemia with labs done there.  Component Ref Range & Units 3 d ago (12/23/21) 3 wk ago (12/05/21) 8 mo ago (04/23/21) 1 yr ago (10/23/20) 1 yr ago (06/15/20) 1 yr ago (02/20/20) 2 yr ago (04/15/19)  WBC 4.0 - 10.5 K/uL 7.7  7.1  7.5  7.2  5.8  6.8  6.3 R   RBC 3.87 - 5.11 MIL/uL 3.98  4.22  4.26  4.30  4.42  4.45  4.60 R   Hemoglobin 12.0 - 15.0 g/dL 9.4 Low   10.0 Low   10.8 Low   10.4 Low   11.1 Low   11.3 Low   11.2 R   HCT 36.0 - 46.0 % 29.9 Low   31.3 Low   33.1 Low   33.1 Low   33.8 Low   35.5 Low   35.3 R   MCV 80.0 - 100.0 fL 75.1 Low   74.2 Low   77.7 Low   77.0 Low   76.5 Low   79.8 Low   77 Low  R   MCH 26.0 - 34.0 pg 23.6 Low   23.7 Low   25.4 Low   24.2 Low   25.1 Low   25.4 Low   24.3 Low  R   MCHC 30.0 - 36.0 g/dL 31.4  31.9  32.6  31.4  32.8  31.8  31.7 R   RDW 11.5 - 15.5 % 14.6  14.6  14.8  14.6  14.2  13.4  15.8 High  R   Platelets 150 - 400 K/uL 311  320  262  319  305  311  289    Menses? Seeing GYN?  Other ER visit was for acute onset of shortness of breath. Didn't respond to albuterol inhaler, felt similar to prior pericarditis. She had  negative d-dimer, BNP, troponin, CXR. BP was very high at visit Sugars were also noted to be high at ER visit.  Diabetes:  Sugars were poorly controlled at her last visit. Part of that was related to stopping Xigduo for 6 weeks (sugars very high during that time--stopped related to issue with swelling/redness in "private area". She restarted Xigduo the end of February. She was given diflucan '150mg'$  to take weekly for up to 4  weeks, if needed. Her Rybelsus dose was increased at her April visit.  She has had issues with yeast infections since starting Farxiga, but also had very high sugars, had been on antibiotics for dental infection. Previously didn't tolerate Trulicity due to nausea, belching.  Lab Results  Component Value Date   HGBA1C 9.8 (A) 09/05/2021   Sugars are running   She denies hypoglycemia, polydipsia, polyuria. Last eye exam was in 11/2020, no retinopathy. She checks her feet regularly, denies concerns.     Hypertension:   She reports compliance with valsartan '320mg'$  (switched from losartan by [facility] in 03/2021), carvedilol '25mg'$  BID, and spironolactone '25mg'$  daily. She previously was seen in HTN clinic by Dr. Oval Linsey (02/2020).  Renal artery [place] was recommended, and checking for hyperaldosteronism was also planned.  Pt was noncompliant in f/u. She was referred back in April, messages left, patient hasn't scheduled appointment yet.   BP's are running  BP Readings from Last 3 Encounters:  12/23/21 (!) 170/99  12/05/21 (!) 167/105  12/05/21 (!) 159/111    She denies headaches, dizziness, chest pain, shortness of breath, edema, muscle cramps. Shortness of breath 7/12   Vitamin D deficiency:  Last level was 29.7 in 02/2020.  She is currently taking 2000 U daily.   Hyperlipidemia:  She is taking atorvastatin '20mg'$  and denies side effects. Still using condoms for contraception, and aware of the potential risks of statins and pregnancy.  Plans to see OB to discuss BTL.   [facility]ids done 03/2021 at [facility] were at goal:   Ref Range & Units 4 mo ago  Total Cholesterol 25 - 199 MG/DL 149   Triglycerides 10 - 150 MG/DL 123   HDL Cholesterol 35 - 135 MG/DL 54   LDL Cholesterol Calculated 0 - 99 MG/DL 70   Total Chol / HDL Cholesterol <4.5 2.8   Non-HDL Cholesterol MG/DL 95     OSA:  using CPAP??  Allergies and asthma: She has had many UC/ER visit for wheezing in the past  She was referred to allergist  through [facility], but never saw them, as her insurance had changed. In April she reported being willing to see allergist. Referral was placed--they tried contacting her 3x, then closed it, never got scheduled. She has albuterol for prn use.   PMH, PSH, SH reviewed    ROS:   PHYSICAL EXAM:  LMP 12/19/2021 (Exact Date)   Wt Readings from Last 3 Encounters:  12/22/21 288 lb (130.6 kg)  12/05/21 286 lb (129.7 kg)  09/05/21 288 lb 12.8 oz (131 kg)   Well-appearing, pleasant, obese female in no distress HEENT: conjunctiva and sclera are clear, EOMI. OP clear.  Neck: no lymphadenopathy, thyromegaly or carotid bruit Heart: regular rate and rhythm, no murmur Lungs: clear bilaterally, no wheezes Back: no spinal or CVA tenderness Abdomen: soft, nontender, no mass Extremities: no edema, normal pulses Psych: normal mood, affect, hygiene and grooming Neuro: alert and oriented, normal gait, strength    ***update if any L flank tenderness DIABETIC FOOT EXAM   ASSESSMENT/PLAN:    A1c Vit D, urine microalb, Hep C, ferritin, iron, B12  Is she compliant with meds????  Sugars and BP's very high at last 2 ER visits Sabrina filled lipitor and carvedilol x30 recently. Lipids not due until 03/2022, can RF lipitor x90d . ??if needs xigduo, valsartan, spironolactone, amlodipine Should need rybelsus RF  Give phone # for Dr. Blenda Mounts clinic.  They called her twice (from April's referral), hasn't scheduled yet. She never scheduled with allergist, they closed referral after 3 attempts.  Has she seen GYN?  When was last pap?  Are cycles heavy? WORSENING ANEMIA Lists of sugars and BP's were hopefully brought... OSA--using CPAP???  DIABETIC FOOT EXAM TODAY  Diabetic eye exam due  F/u 3 mos

## 2021-12-27 ENCOUNTER — Ambulatory Visit (INDEPENDENT_AMBULATORY_CARE_PROVIDER_SITE_OTHER): Payer: Commercial Managed Care - HMO | Admitting: Family Medicine

## 2021-12-27 ENCOUNTER — Other Ambulatory Visit: Payer: Self-pay | Admitting: *Deleted

## 2021-12-27 ENCOUNTER — Encounter: Payer: Self-pay | Admitting: Family Medicine

## 2021-12-27 VITALS — BP 140/100 | HR 76 | Ht 65.0 in | Wt 290.6 lb

## 2021-12-27 DIAGNOSIS — E1159 Type 2 diabetes mellitus with other circulatory complications: Secondary | ICD-10-CM

## 2021-12-27 DIAGNOSIS — Z5181 Encounter for therapeutic drug level monitoring: Secondary | ICD-10-CM

## 2021-12-27 DIAGNOSIS — I152 Hypertension secondary to endocrine disorders: Secondary | ICD-10-CM

## 2021-12-27 DIAGNOSIS — E1169 Type 2 diabetes mellitus with other specified complication: Secondary | ICD-10-CM

## 2021-12-27 DIAGNOSIS — I5189 Other ill-defined heart diseases: Secondary | ICD-10-CM

## 2021-12-27 DIAGNOSIS — R109 Unspecified abdominal pain: Secondary | ICD-10-CM

## 2021-12-27 DIAGNOSIS — R10A2 Flank pain, left side: Secondary | ICD-10-CM

## 2021-12-27 DIAGNOSIS — I10 Essential (primary) hypertension: Secondary | ICD-10-CM

## 2021-12-27 DIAGNOSIS — E118 Type 2 diabetes mellitus with unspecified complications: Secondary | ICD-10-CM | POA: Diagnosis not present

## 2021-12-27 DIAGNOSIS — E66813 Obesity, class 3: Secondary | ICD-10-CM

## 2021-12-27 DIAGNOSIS — E559 Vitamin D deficiency, unspecified: Secondary | ICD-10-CM

## 2021-12-27 DIAGNOSIS — D509 Iron deficiency anemia, unspecified: Secondary | ICD-10-CM

## 2021-12-27 DIAGNOSIS — Z1159 Encounter for screening for other viral diseases: Secondary | ICD-10-CM

## 2021-12-27 LAB — POCT GLYCOSYLATED HEMOGLOBIN (HGB A1C): Hemoglobin A1C: 8.4 % — AB (ref 4.0–5.6)

## 2021-12-27 MED ORDER — SPIRONOLACTONE 25 MG PO TABS
ORAL_TABLET | Freq: Every day | ORAL | 0 refills | Status: DC
Start: 2021-12-27 — End: 2022-01-07

## 2021-12-27 MED ORDER — CARVEDILOL 25 MG PO TABS: 25.0000 mg | ORAL_TABLET | Freq: Two times a day (BID) | ORAL | 0 refills | Status: DC

## 2021-12-27 MED ORDER — IRON (FERROUS SULFATE) 325 (65 FE) MG PO TABS: 325.0000 mg | ORAL_TABLET | Freq: Two times a day (BID) | ORAL | 0 refills | Status: DC

## 2021-12-27 MED ORDER — ATORVASTATIN CALCIUM 20 MG PO TABS: 20.0000 mg | ORAL_TABLET | Freq: Every day | ORAL | 0 refills | Status: DC

## 2021-12-27 MED ORDER — FREESTYLE LIBRE 2 SENSOR MISC: 1.0000 | 0 refills | Status: DC

## 2021-12-27 MED ORDER — RYBELSUS 14 MG PO TABS: 14.0000 mg | ORAL_TABLET | Freq: Every day | ORAL | 2 refills | Status: DC

## 2021-12-27 MED ORDER — VALSARTAN 320 MG PO TABS: 320.0000 mg | ORAL_TABLET | Freq: Every day | ORAL | 0 refills | Status: DC

## 2021-12-27 MED ORDER — XIGDUO XR 10-1000 MG PO TB24: 1.0000 | ORAL_TABLET | Freq: Every day | ORAL | 2 refills | Status: DC

## 2021-12-28 LAB — IRON: Iron: 32 ug/dL (ref 27–159)

## 2021-12-28 LAB — MICROALBUMIN / CREATININE URINE RATIO
Creatinine, Urine: 46.3 mg/dL
Microalb/Creat Ratio: 9 mg/g creat (ref 0–29)
Microalbumin, Urine: 4.3 ug/mL

## 2021-12-28 LAB — HEPATITIS C ANTIBODY: Hep C Virus Ab: NONREACTIVE

## 2021-12-28 LAB — VITAMIN D 25 HYDROXY (VIT D DEFICIENCY, FRACTURES): Vit D, 25-Hydroxy: 30.3 ng/mL (ref 30.0–100.0)

## 2021-12-28 LAB — VITAMIN B12: Vitamin B-12: 411 pg/mL (ref 232–1245)

## 2021-12-28 LAB — FERRITIN: Ferritin: 10 ng/mL — ABNORMAL LOW (ref 15–150)

## 2022-01-02 ENCOUNTER — Telehealth: Payer: Self-pay | Admitting: Family Medicine

## 2022-01-02 NOTE — Telephone Encounter (Signed)
Does pt have enough '7mg'$  at home to last until prior auth? If going to take long, we could always RF it (Vs going off label and having her take more of the '3mg'$  tablets--not my preference to do that, as it is off label and night not work the same). Most I would do would give '3mg'$  samples, have her only take 2 tablets (not to get to '14mg'$ ), for just a couple of days or so until she either gets RF on 7 or gets approved for the '14mg'$ 

## 2022-01-02 NOTE — Telephone Encounter (Signed)
Dr. Tomi Bamberger would you like for me to offer pt samples to hold her until P.A. is resolved?  We have '3mg'$  samples

## 2022-01-02 NOTE — Telephone Encounter (Signed)
Pt has a current P.A. approval for the 7 mg but her insurance is requiring another P.A. for dosage increase and it can take 72 to 150 hours for response

## 2022-01-02 NOTE — Telephone Encounter (Signed)
She is being prescribed '14mg'$ , and is already taking '7mg'$ .  Does she need a prior auth to refill the '7mg'$ ? Is it something specific with the '14mg'$  dose? She has been taking '7mg'$  for the last few months.

## 2022-01-07 ENCOUNTER — Telehealth: Payer: Self-pay | Admitting: Family Medicine

## 2022-01-07 DIAGNOSIS — I10 Essential (primary) hypertension: Secondary | ICD-10-CM

## 2022-01-07 DIAGNOSIS — E1169 Type 2 diabetes mellitus with other specified complication: Secondary | ICD-10-CM

## 2022-01-07 DIAGNOSIS — I5189 Other ill-defined heart diseases: Secondary | ICD-10-CM

## 2022-01-07 MED ORDER — CARVEDILOL 25 MG PO TABS: 25.0000 mg | ORAL_TABLET | Freq: Two times a day (BID) | ORAL | 0 refills | Status: DC

## 2022-01-07 MED ORDER — SPIRONOLACTONE 25 MG PO TABS: ORAL_TABLET | Freq: Every day | ORAL | 0 refills | Status: DC

## 2022-01-07 MED ORDER — ATORVASTATIN CALCIUM 20 MG PO TABS: 20.0000 mg | ORAL_TABLET | Freq: Every day | ORAL | 0 refills | Status: DC

## 2022-01-07 MED ORDER — VALSARTAN 320 MG PO TABS: 320.0000 mg | ORAL_TABLET | Freq: Every day | ORAL | 0 refills | Status: DC

## 2022-01-07 NOTE — Telephone Encounter (Signed)
Sent, change made to new mail order pharmacy per patient request.

## 2022-01-07 NOTE — Telephone Encounter (Signed)
Fax refill request from [facility] delivery  for 90 day supplies for Valsartan Atorvastatin Carvedilol Spironolactone   I did verify this with the patient. She thought they were going to wait to send request later since she just got refills but They went ahead and sent it over

## 2022-01-07 NOTE — Addendum Note (Signed)
Addended by: Carolee Rota F on: 01/07/2022 11:18 AM   Modules accepted: Orders

## 2022-01-07 NOTE — Telephone Encounter (Signed)
Filiberto Pinks T# [phone] ID# [id number] & said P.A. still under review with medical director they couldn't tell me what the issue was & said should have answer within a day or 2 if not to call back.  Called pt and she still has some '7mg'$  left & will just use those for now and let me know if she needs samples.

## 2022-01-19 NOTE — Telephone Encounter (Signed)
P.A. was denied, resubmitted with additional info

## 2022-01-22 ENCOUNTER — Encounter: Payer: Self-pay | Admitting: Family Medicine

## 2022-01-22 NOTE — Telephone Encounter (Signed)
Called [employer] regarding denial t# [phone], appeal is required.  She couldn't explain why it was approved in 4/23 & not now except now they are requiring documentation/medical records.

## 2022-01-23 NOTE — Telephone Encounter (Signed)
Appeal letter & records faxed to [facility] [phone]

## 2022-01-28 ENCOUNTER — Telehealth: Payer: Commercial Managed Care - HMO | Admitting: Physician Assistant

## 2022-01-28 DIAGNOSIS — B9789 Other viral agents as the cause of diseases classified elsewhere: Secondary | ICD-10-CM | POA: Diagnosis not present

## 2022-01-28 DIAGNOSIS — J019 Acute sinusitis, unspecified: Secondary | ICD-10-CM | POA: Diagnosis not present

## 2022-01-29 MED ORDER — FLUTICASONE PROPIONATE 50 MCG/ACT NA SUSP: 2.0000 | Freq: Every day | NASAL | 0 refills | Status: DC

## 2022-01-29 NOTE — Progress Notes (Signed)

## 2022-01-29 NOTE — Progress Notes (Signed)
I have spent 5 minutes in review of e-visit questionnaire, review and updating patient chart, medical decision making and response to patient.   Elyna Pangilinan Cody Hershall Benkert, PA-C    

## 2022-01-30 ENCOUNTER — Encounter: Payer: Self-pay | Admitting: Internal Medicine

## 2022-02-05 NOTE — Telephone Encounter (Signed)
Appeal denied, looks like the only disqualification left in the explaination is that pt has to have tried and failed Trulicity & Bydureon, Bydureon BCise or [place].  Pt has already tried and failed Trulicity (she also says it made her sick).   Can pt be switched to one of the others? and if so, does she continue with the Rybelsus '7mg'$  or stop that.   Pt was informed today of what is going on and would be tomorrow before I hear back from Dr. Tomi Bamberger for decision.

## 2022-02-05 NOTE — Telephone Encounter (Signed)
Ok to switch to [employer], '2mg'$  [place] weekly (# 4 with 2 refills). This would REPLACE the '7mg'$  Rybelsus. If she doesn't tolerate Byrudeon, she needs to let us know so that we can try again for the '14mg'$  Rybelsus.

## 2022-02-06 MED ORDER — BYDUREON BCISE 2 MG/0.85ML ~~LOC~~ AUIJ: 2.0000 mg | AUTO-INJECTOR | SUBCUTANEOUS | 2 refills | Status: DC

## 2022-02-06 NOTE — Telephone Encounter (Signed)
Patient advised and rx sent to [facility] for [facility] #4 with 2 refills.

## 2022-02-09 NOTE — Telephone Encounter (Signed)
Pt switched.

## 2022-02-27 ENCOUNTER — Telehealth: Payer: Commercial Managed Care - HMO | Admitting: Physician Assistant

## 2022-02-27 DIAGNOSIS — K047 Periapical abscess without sinus: Secondary | ICD-10-CM | POA: Diagnosis not present

## 2022-02-27 MED ORDER — PENICILLIN V POTASSIUM 500 MG PO TABS: 500.0000 mg | ORAL_TABLET | Freq: Three times a day (TID) | ORAL | 0 refills | Status: AC

## 2022-02-27 MED ORDER — IBUPROFEN 600 MG PO TABS: 600.0000 mg | ORAL_TABLET | Freq: Three times a day (TID) | ORAL | 0 refills | Status: DC | PRN

## 2022-02-27 NOTE — Progress Notes (Signed)

## 2022-03-03 ENCOUNTER — Telehealth: Payer: Commercial Managed Care - HMO | Admitting: Physician Assistant

## 2022-03-03 DIAGNOSIS — B3731 Acute candidiasis of vulva and vagina: Secondary | ICD-10-CM

## 2022-03-03 MED ORDER — FLUCONAZOLE 150 MG PO TABS: ORAL_TABLET | ORAL | 0 refills | Status: DC

## 2022-03-03 NOTE — Progress Notes (Signed)
I have spent 5 minutes in review of e-visit questionnaire, review and updating patient chart, medical decision making and response to patient.   Arlyce Circle Cody Kristine Chahal, PA-C    

## 2022-03-03 NOTE — Progress Notes (Signed)
E-Visit for Vaginal Symptoms  We are sorry that you are not feeling well. Here is how we plan to help! Based on what you shared with me it looks like you: May have a yeast vaginosis  Vaginosis is an inflammation of the vagina that can result in discharge, itching and pain. The cause is usually a change in the normal balance of vaginal bacteria or an infection. Vaginosis can also result from reduced estrogen levels after menopause.  The most common causes of vaginosis are:   Bacterial vaginosis which results from an overgrowth of one on several organisms that are normally present in your vagina.   Yeast infections which are caused by a naturally occurring fungus called candida.   Vaginal atrophy (atrophic vaginosis) which results from the thinning of the vagina from reduced estrogen levels after menopause.   Trichomoniasis which is caused by a parasite and is commonly transmitted by sexual intercourse.  Factors that increase your risk of developing vaginosis include: Medications, such as antibiotics and steroids Uncontrolled diabetes Use of hygiene products such as bubble bath, vaginal spray or vaginal deodorant Douching Wearing damp or tight-fitting clothing Using an intrauterine device (IUD) for birth control Hormonal changes, such as those associated with pregnancy, birth control pills or menopause Sexual activity Having a sexually transmitted infection  Your treatment plan is Diflucan (fluconazole) '150mg'$  tablet once, repeating in 3 days.  I have electronically sent this prescription into the pharmacy that you have chosen. If yu are getting frequent infections from use of the Xigduo, you need to speak to your PCP regarding this so adjustments in treatment can be made.  Be sure to take all of the medication as directed. Stop taking any medication if you develop a rash, tongue swelling or shortness of breath. Mothers who are breast feeding should consider pumping and discarding their  breast milk while on these antibiotics. However, there is no consensus that infant exposure at these doses would be harmful.  Remember that medication creams can weaken latex condoms. Marland Kitchen   HOME CARE:  Good hygiene may prevent some types of vaginosis from recurring and may relieve some symptoms:  Avoid baths, hot tubs and whirlpool spas. Rinse soap from your outer genital area after a shower, and dry the area well to prevent irritation. Don't use scented or harsh soaps, such as those with deodorant or antibacterial action. Avoid irritants. These include scented tampons and pads. Wipe from front to back after using the toilet. Doing so avoids spreading fecal bacteria to your vagina.  Other things that may help prevent vaginosis include:  Don't douche. Your vagina doesn't require cleansing other than normal bathing. Repetitive douching disrupts the normal organisms that reside in the vagina and can actually increase your risk of vaginal infection. Douching won't clear up a vaginal infection. Use a latex condom. Both female and female latex condoms may help you avoid infections spread by sexual contact. Wear cotton underwear. Also wear pantyhose with a cotton crotch. If you feel comfortable without it, skip wearing underwear to bed. Yeast thrives in [employer] Your symptoms should improve in the next day or two.  GET HELP RIGHT AWAY IF:  You have pain in your lower abdomen ( pelvic area or over your ovaries) You develop nausea or vomiting You develop a fever Your discharge changes or worsens You have persistent pain with intercourse You develop shortness of breath, a rapid pulse, or you faint.  These symptoms could be signs of problems or infections that need to  be evaluated by a medical provider now.  MAKE SURE YOU   Understand these instructions. Will watch your condition. Will get help right away if you are not doing well or get worse.  Thank you for choosing an  e-visit.  Your e-visit answers were reviewed by a board certified advanced clinical practitioner to complete your personal care plan. Depending upon the condition, your plan could have included both over the counter or prescription medications.  Please review your pharmacy choice. Make sure the pharmacy is open so you can pick up prescription now. If there is a problem, you may contact your provider through [employer] and have the prescription routed to another pharmacy.  Your safety is important to [place]. If you have drug allergies check your prescription carefully.   For the next 24 hours you can use MyChart to ask questions about today's visit, request a non-urgent call back, or ask for a work or school excuse. You will get an email in the next two days asking about your experience. I hope that your e-visit has been valuable and will speed your recovery.

## 2022-03-05 ENCOUNTER — Encounter: Payer: Self-pay | Admitting: Internal Medicine

## 2022-03-07 ENCOUNTER — Ambulatory Visit (HOSPITAL_BASED_OUTPATIENT_CLINIC_OR_DEPARTMENT_OTHER): Payer: Commercial Managed Care - HMO | Admitting: Family

## 2022-03-18 ENCOUNTER — Other Ambulatory Visit: Payer: Self-pay | Admitting: *Deleted

## 2022-03-18 DIAGNOSIS — I10 Essential (primary) hypertension: Secondary | ICD-10-CM

## 2022-03-18 MED ORDER — CARVEDILOL 25 MG PO TABS: 25.0000 mg | ORAL_TABLET | Freq: Two times a day (BID) | ORAL | 0 refills | Status: DC

## 2022-04-15 NOTE — Progress Notes (Unsigned)
No chief complaint on file.   Diabetes:  Sugars were poorly controlled at her last visit. A1c was 8.4% in August.  At that time she was on Rybelsus 12m daily, and sugars were in the 160's. 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 We tried to increase to the 162mdose, but this wasn't covered by insurance.  Instead, we switched to Bydureon B-cise in September. She is tolerating this without side effects. UPDATE (Previously didn't tolerate Trulicity due to nausea, belching). She continues on Xigduo. Sugars are running   She denies hypoglycemia, polydipsia, polyuria. Last eye exam was in 11/2020, no retinopathy. She checks her feet regularly, denies concerns.   Lab Results  Component Value Date   HGBA1C 8.4 (A) 12/27/2021    Hypertension:  She reports compliance with valsartan 32027mswitched from losartan by [facility] in 03/2021), carvedilol 74m67mD, and spironolactone 74mg45mly. She previously was seen in HTN clinic by Dr. RandoOval Linsey2021).  Renal artery [place], and checking for hyperaldosteronism was also planned. Pt was noncompliant in f/u. She was referred back in April. This was discussed again at her last visit in August, and had appt scheduled for October, but she cancelled it. BP's were very high at her last visit.  BP's are running   Currently she denies headaches, dizziness, chest pain, shortness of breath, edema, muscle cramps.  BP Readings from Last 3 Encounters:  12/27/21 (!) 140/100  12/23/21 (!) 170/99  12/05/21 (!) 167/105     Vitamin D deficiency:  Last level was 30.3 in August, when taking 2000 U daily.   Hyperlipidemia:  She is taking atorvastatin 20mg 48mdenies side effects. Still using condoms for contraception, and aware of the potential risks of statins and pregnancy.  Plans to see OB-GYN to discuss BTL.    [facility]   Ref Range & Units    Total Cholesterol 25 - 199 MG/DL 149    Triglycerides 10 - 150 MG/DL 123   HDL Cholesterol 35 - 135 MG/DL 54   LDL Cholesterol Calculated 0 - 99 MG/DL 70   Total Chol / HDL Cholesterol <4.5 2.8   Non-HDL Cholesterol MG/DL 95     Anemia: She reports that her menses are heavy.  In August she reported getting cycles twice a month.  She hadn't seen GYN in 3 years, Dr. MeisinWillis Modenatopped taking her insurance. Taking iron? Appt with GYN? Still heavy and 2x/month?  Lab Results  Component Value Date   WBC 7.7 12/23/2021   HGB 9.4 (L) 12/23/2021   HCT 29.9 (L) 12/23/2021   MCV 75.1 (L) 12/23/2021   PLT 311 12/23/2021    Allergies and asthma: She has had many UC/ER visit for wheezing in the past  She was referred to allergist through [facility], but never saw them, as her insurance had changed. In April she reported being willing to see allergist. Referral was placed--they tried contacting her 3x, then closed it, never got scheduled. She has albuterol for prn use. She is not currently having problems.    ROS: no fever, chills, URI symptoms, headaches, dizziness, chest pain, shortness of breath, GI or GU complaints.   Moods are good. Recent dental issues, and antibiotics caused yeast infection (treated via e-visits). Heavy cycles weight See HPI   PHYSICAL EXAM:  There were no vitals taken for this visit.  Wt Readings from Last 3 Encounters:  12/27/21 290 lb 9.6 oz (  131.8 kg)  12/22/21 288 lb (130.6 kg)  12/05/21 286 lb (129.7 kg)   Well-appearing, pleasant, obese female in no distress HEENT: conjunctiva and sclera are clear, EOMI.  Neck: no lymphadenopathy, thyromegaly or mass Heart: regular rate and rhythm, no murmur Lungs: clear bilaterally, no wheezes Back: no spinal tenderness or CVA tenderness Abdomen: soft, nontender, no mass Extremities: no edema, normal pulses, Psych: normal mood, affect, hygiene and grooming Neuro: alert and oriented, normal gait  ASSESSMENT/PLAN:  A1c Flu/COVID  Has she had DM eye  exam?  Last was 11/2020.  Scheduled?? Did she ever schedule with GYN? (Long past due)--to discuss BTL, and f/u on anemia, pap. Taking iron?  Why did she cancel cardiology visit in October?? You called and made that appointment for her--We filled her BP meds x90d to last until visit. She canceled 03-07-22 visit, then we got RF request 10/23 (carvedilol only, others due now) Rest of BP meds due to be refilled now. This is VERY FRUSTRATING.  Hoping BP is not through the roof like it usually is...   Increase vitamin D over winter. Cbc, ferritin if hasn't seen GYN Lipids now due, and c-met  RF xigduo, diovan, spironolactone, lipitor,

## 2022-04-17 ENCOUNTER — Ambulatory Visit (INDEPENDENT_AMBULATORY_CARE_PROVIDER_SITE_OTHER): Payer: Commercial Managed Care - HMO | Admitting: Family Medicine

## 2022-04-17 ENCOUNTER — Encounter: Payer: Self-pay | Admitting: Family Medicine

## 2022-04-17 VITALS — BP 148/98 | HR 68 | Ht 64.0 in | Wt 283.2 lb

## 2022-04-17 DIAGNOSIS — E1169 Type 2 diabetes mellitus with other specified complication: Secondary | ICD-10-CM | POA: Diagnosis not present

## 2022-04-17 DIAGNOSIS — E118 Type 2 diabetes mellitus with unspecified complications: Secondary | ICD-10-CM | POA: Diagnosis not present

## 2022-04-17 DIAGNOSIS — E785 Hyperlipidemia, unspecified: Secondary | ICD-10-CM

## 2022-04-17 DIAGNOSIS — D509 Iron deficiency anemia, unspecified: Secondary | ICD-10-CM

## 2022-04-17 DIAGNOSIS — E559 Vitamin D deficiency, unspecified: Secondary | ICD-10-CM

## 2022-04-17 DIAGNOSIS — Z23 Encounter for immunization: Secondary | ICD-10-CM | POA: Diagnosis not present

## 2022-04-17 DIAGNOSIS — I152 Hypertension secondary to endocrine disorders: Secondary | ICD-10-CM

## 2022-04-17 DIAGNOSIS — E1159 Type 2 diabetes mellitus with other circulatory complications: Secondary | ICD-10-CM | POA: Diagnosis not present

## 2022-04-17 DIAGNOSIS — Z5181 Encounter for therapeutic drug level monitoring: Secondary | ICD-10-CM

## 2022-04-17 LAB — POCT GLYCOSYLATED HEMOGLOBIN (HGB A1C): Hemoglobin A1C: 10.2 % — AB (ref 4.0–5.6)

## 2022-04-17 MED ORDER — METFORMIN HCL 1000 MG PO TABS: 1000.0000 mg | ORAL_TABLET | Freq: Two times a day (BID) | ORAL | 1 refills | Status: DC

## 2022-04-17 NOTE — Patient Instructions (Addendum)
Please reschedule your appointment with the cardiologist.  Your blood pressure is NOT adequately controlled on the current regimen, and needs further evaluation.  Be sure to use condoms if/when sexually active again. Please schedule an appointment with an OB-GYN, to discuss tubal ligation, as well as discussing your heavy cycles and iron deficiency anemia (maybe consider other treatments such as IUD or implants to help with contraception and lessen bleeding).  Please schedule diabetic eye exam.  Increase your vitamin D over the winter (take TWO of the 2000 IU daily, through March)

## 2022-04-18 LAB — CBC WITH DIFFERENTIAL/PLATELET
Basophils Absolute: 0 10*3/uL (ref 0.0–0.2)
Basos: 0 %
EOS (ABSOLUTE): 0.1 10*3/uL (ref 0.0–0.4)
Eos: 2 %
Hematocrit: 33.2 % — ABNORMAL LOW (ref 34.0–46.6)
Hemoglobin: 10.8 g/dL — ABNORMAL LOW (ref 11.1–15.9)
Immature Grans (Abs): 0 10*3/uL (ref 0.0–0.1)
Immature Granulocytes: 0 %
Lymphocytes Absolute: 2.1 10*3/uL (ref 0.7–3.1)
Lymphs: 37 %
MCH: 24.1 pg — ABNORMAL LOW (ref 26.6–33.0)
MCHC: 32.5 g/dL (ref 31.5–35.7)
MCV: 74 fL — ABNORMAL LOW (ref 79–97)
Monocytes Absolute: 0.4 10*3/uL (ref 0.1–0.9)
Monocytes: 7 %
Neutrophils Absolute: 3.1 10*3/uL (ref 1.4–7.0)
Neutrophils: 54 %
Platelets: 327 10*3/uL (ref 150–450)
RBC: 4.49 x10E6/uL (ref 3.77–5.28)
RDW: 15.4 % (ref 11.7–15.4)
WBC: 5.7 10*3/uL (ref 3.4–10.8)

## 2022-04-18 LAB — COMPREHENSIVE METABOLIC PANEL
ALT: 11 IU/L (ref 0–32)
AST: 11 IU/L (ref 0–40)
Albumin/Globulin Ratio: 1.4 (ref 1.2–2.2)
Albumin: 3.8 g/dL — ABNORMAL LOW (ref 3.9–4.9)
Alkaline Phosphatase: 77 IU/L (ref 44–121)
BUN/Creatinine Ratio: 13 (ref 9–23)
BUN: 8 mg/dL (ref 6–20)
Bilirubin Total: 0.2 mg/dL (ref 0.0–1.2)
CO2: 23 mmol/L (ref 20–29)
Calcium: 9.4 mg/dL (ref 8.7–10.2)
Chloride: 102 mmol/L (ref 96–106)
Creatinine, Ser: 0.6 mg/dL (ref 0.57–1.00)
Globulin, Total: 2.8 g/dL (ref 1.5–4.5)
Glucose: 209 mg/dL — ABNORMAL HIGH (ref 70–99)
Potassium: 3.8 mmol/L (ref 3.5–5.2)
Sodium: 137 mmol/L (ref 134–144)
Total Protein: 6.6 g/dL (ref 6.0–8.5)
eGFR: 118 mL/min/{1.73_m2} (ref >59)

## 2022-04-18 LAB — LIPID PANEL
Chol/HDL Ratio: 2.8 ratio (ref 0.0–4.4)
Cholesterol, Total: 161 mg/dL (ref 100–199)
HDL: 58 mg/dL (ref >39)
LDL Chol Calc (NIH): 85 mg/dL (ref 0–99)
Triglycerides: 99 mg/dL (ref 0–149)
VLDL Cholesterol Cal: 18 mg/dL (ref 5–40)

## 2022-04-18 LAB — FERRITIN: Ferritin: 11 ng/mL — ABNORMAL LOW (ref 15–150)

## 2022-04-23 ENCOUNTER — Telehealth: Payer: Self-pay | Admitting: Family Medicine

## 2022-04-23 NOTE — Telephone Encounter (Signed)
Yes.  I had sent in Bydureon for patient at her visit 11/22. She stated a preference for oral med, so would like to switch back to Rybelsus, since it is covered. Please call her and let her know of the approval, and she can start the Rybelsus when due for next injection (assuming she filled it and started it). Honestly--she should NOT start it at the '14mg'$  dose, as she has been out of it for quite a while.  Do we have samples where she can take the '3mg'$  for a week, then 7 mg for a week or so, and then resume the '14mg'$  once tolerating it okay? If we don't have samples, I may need to send in rx for the lower doses. Let me know what we have sample-wise so I know what to send in to the pharmacy for her.

## 2022-04-23 NOTE — Telephone Encounter (Signed)
[employer] appeals department t# [phone] called & states the denial for appeal for Rybelsus '14mg'$  was reviewed by outside source after & reviewing the appeal it was overturned & now approved for continuation of coverage due to medical necessity has been established.  It will take a couple days to process before can fill. Dr. Tomi Bamberger do you want to switch pt back to Rybelsus?

## 2022-04-30 NOTE — Telephone Encounter (Signed)
Victoria [name] said would take a couple days for this to be able to process so waited before calling patient.  I called [name] had them rerun the Rybelsus '14mg'$  & it still wouldn't go thru.  Called [employer] and left message  We do have Rybelsus 3 mg samples

## 2022-05-03 MED ORDER — RYBELSUS 14 MG PO TABS: 14.0000 mg | ORAL_TABLET | Freq: Every day | ORAL | 0 refills | Status: DC

## 2022-05-03 NOTE — Telephone Encounter (Signed)
Called [facility] again & Rybelsus now going thru for $24.99 will order & be in Monday evening.  Called pt and left message.

## 2022-05-04 NOTE — Telephone Encounter (Signed)
done

## 2022-05-29 IMAGING — MR MR HEAD W/O CM
10 series · 48 of 48 positions shown · non-contrast
Comparison: 1252

CLINICAL DATA: Neuro deficit, acute, stroke suspected

EXAM:
MRI HEAD WITHOUT CONTRAST
TECHNIQUE: Multiplanar, multiecho pulse sequences of the brain and surrounding
structures were obtained without intravenous contrast.

[Series 5: DWI · axial · 3.0mm · 1.36mm/px · z∈[-50,+107]mm · 9 of 108 slices shown (1 of 2)]
[im 1/108]
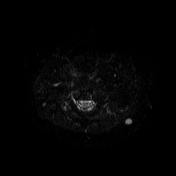
[im 14/108]
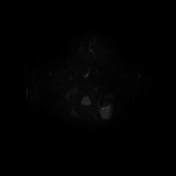
[im 27/108]
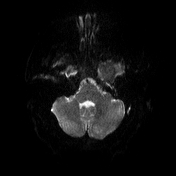
[im 41/108]
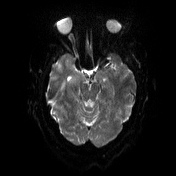
[im 54/108]
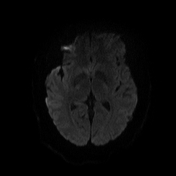
[im 67/108]
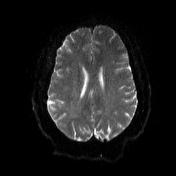
[im 81/108]
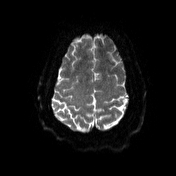
[im 94/108]
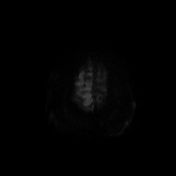
[im 108/108]
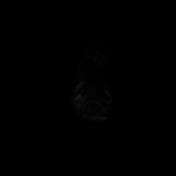

[Series 6: DWI · axial · 3.0mm · 1.36mm/px · z∈[-50,+107]mm · 4 of 54 slices shown (2 of 2)]
[im 1/54]
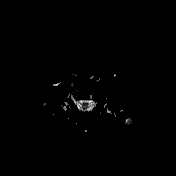
[im 18/54]
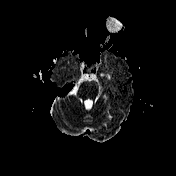
[im 36/54]
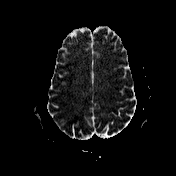
[im 54/54]
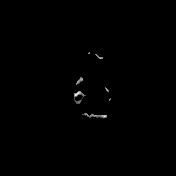

[Series 7: T1 · sagittal · 5.0mm · 0.75mm/px · 2 of 24 slices shown (1 of 2)]
[im 1/24]
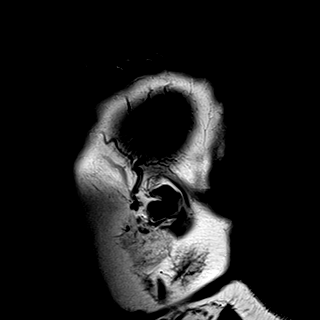
[im 24/24]
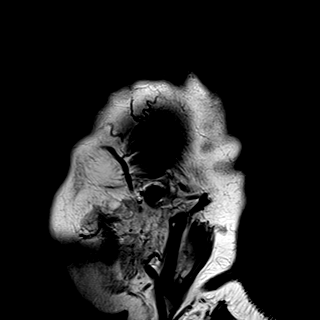

[Series 8: T2 · axial · 5.0mm · 0.62mm/px · z∈[-57,+103]mm · 2 of 26 slices shown (1 of 2)]
[im 1/26]
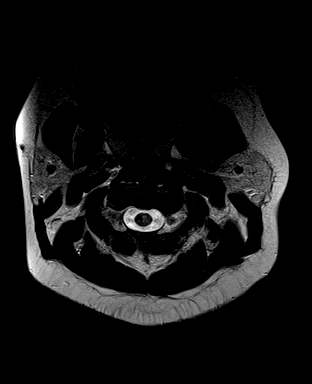
[im 26/26]
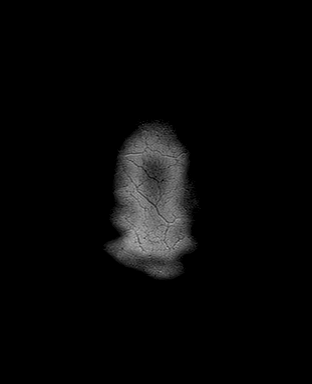

[Series 9: FLAIR · axial · 3.0mm · 0.75mm/px · z∈[-52,+98]mm · 4 of 52 slices shown]
[im 1/52]
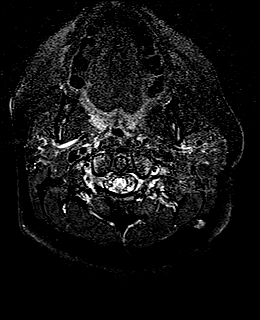
[im 18/52]
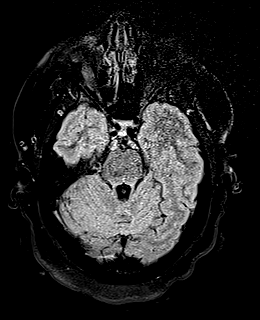
[im 35/52]
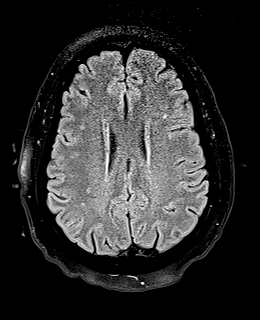
[im 52/52]
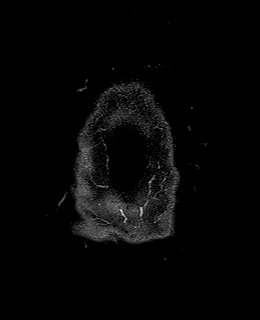

[Series 10: swi_images · axial · 3.0mm · 0.75mm/px · z∈[-53,+98]mm · 4 of 52 slices shown]
[im 1/52]
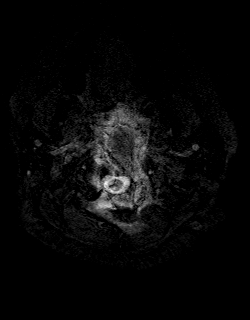
[im 18/52]
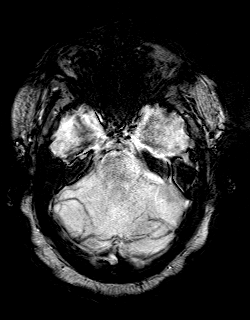
[im 35/52]
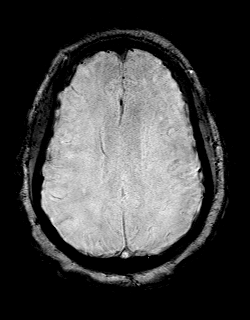
[im 52/52]
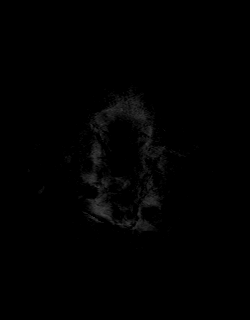

[Series 12: T1 · axial · 1.0mm · 0.94mm/px · z∈[-56,+102]mm · 13 of 160 slices shown (2 of 2)]
[im 1/160]
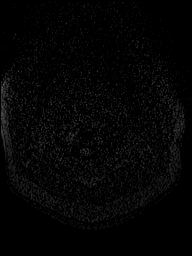
[im 14/160]
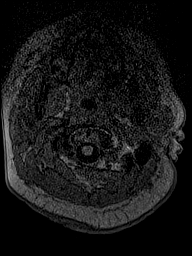
[im 27/160]
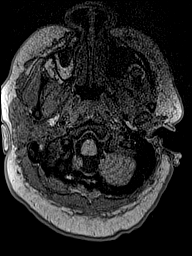
[im 40/160]
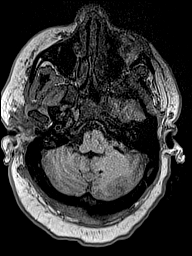
[im 54/160]
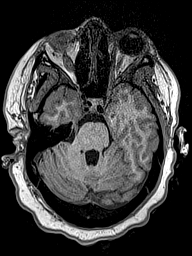
[im 67/160]
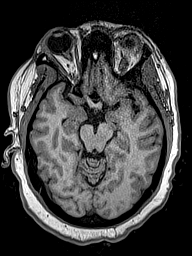
[im 80/160]
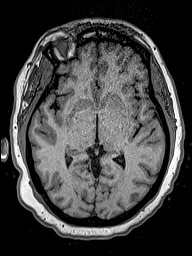
[im 93/160]
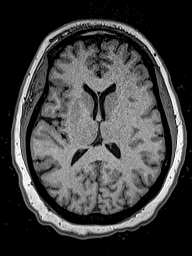
[im 107/160]
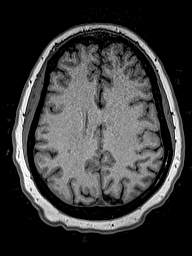
[im 120/160]
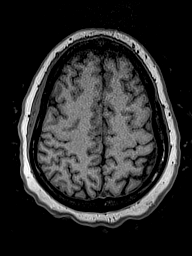
[im 133/160]
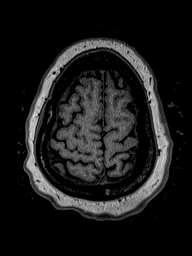
[im 146/160]
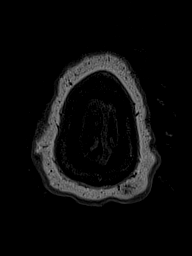
[im 160/160]
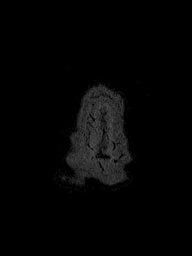

[Series 13: cor dwi_tracew · coronal · 5.0mm · 1.53mm/px · 5 of 56 slices shown]
[im 1/56]
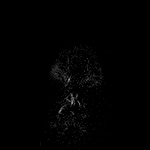
[im 14/56]
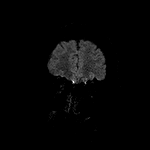
[im 28/56]
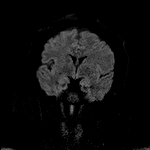
[im 42/56]
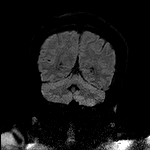
[im 56/56]
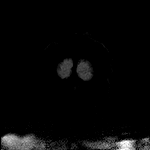

[Series 14: cor dwi_adc · coronal · 5.0mm · 1.53mm/px · 2 of 25 slices shown]
[im 1/25]
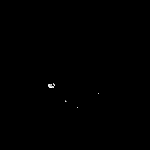
[im 25/25]
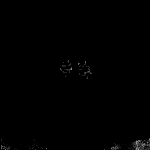

[Series 15: T2 · coronal · 5.0mm · 0.57mm/px · 3 of 34 slices shown (2 of 2)]
[im 1/34]
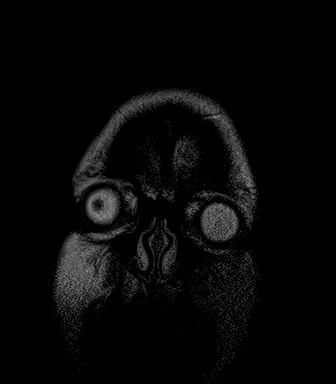
[im 17/34]
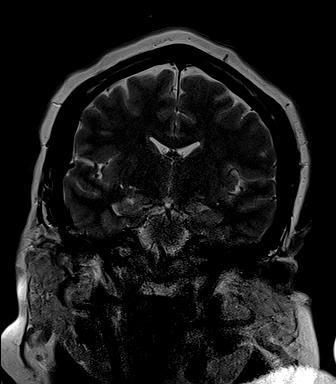
[im 34/34]
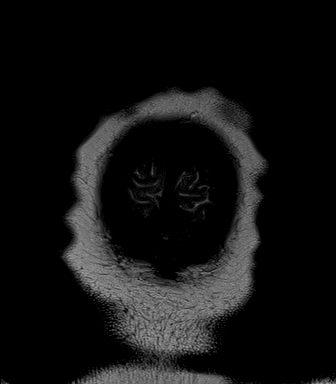

[48 of 48 positions shown; findings below may reference images not displayed]

FINDINGS: Brain: There is no acute infarction or intracranial hemorrhage.
There is no intracranial mass, mass effect, or edema. There is no
hydrocephalus or extra-axial fluid collection. Ventricles and sulci
are normal in size and configuration. Few scattered foci of T2
hyperintensity in the supratentorial white matter likely reflecting
nonspecific gliosis/demyelination.

Vascular: Major vessel flow voids at the skull base are preserved.

Skull and upper cervical spine: Normal marrow signal is preserved.

Sinuses/Orbits: Paranasal sinuses are aerated. Orbits are
unremarkable.

Other: Sella is unremarkable.  Mastoid air cells are clear.
IMPRESSION: No evidence of recent infarction, hemorrhage, or mass. Stable few
scattered foci of nonspecific gliosis/demyelination in the cerebral
white matter.

## 2022-06-05 ENCOUNTER — Encounter (HOSPITAL_COMMUNITY): Payer: Self-pay | Admitting: Emergency Medicine

## 2022-06-05 ENCOUNTER — Other Ambulatory Visit: Payer: Self-pay

## 2022-06-05 ENCOUNTER — Emergency Department (HOSPITAL_COMMUNITY): Payer: Commercial Managed Care - HMO

## 2022-06-05 ENCOUNTER — Emergency Department (HOSPITAL_COMMUNITY)
Admission: EM | Admit: 2022-06-05 | Discharge: 2022-06-06 | Disposition: A | Discharge status: Home / Self Care | Payer: Commercial Managed Care - HMO | Attending: Emergency Medicine | Admitting: Emergency Medicine

## 2022-06-05 DIAGNOSIS — R079 Chest pain, unspecified: Secondary | ICD-10-CM | POA: Insufficient documentation

## 2022-06-05 DIAGNOSIS — R739 Hyperglycemia, unspecified: Secondary | ICD-10-CM | POA: Diagnosis not present

## 2022-06-05 DIAGNOSIS — R42 Dizziness and giddiness: Secondary | ICD-10-CM | POA: Insufficient documentation

## 2022-06-05 DIAGNOSIS — E119 Type 2 diabetes mellitus without complications: Secondary | ICD-10-CM | POA: Diagnosis not present

## 2022-06-05 DIAGNOSIS — Z91010 Allergy to peanuts: Secondary | ICD-10-CM | POA: Diagnosis not present

## 2022-06-05 DIAGNOSIS — R002 Palpitations: Secondary | ICD-10-CM | POA: Diagnosis not present

## 2022-06-05 DIAGNOSIS — J45909 Unspecified asthma, uncomplicated: Secondary | ICD-10-CM | POA: Diagnosis not present

## 2022-06-05 DIAGNOSIS — Z7951 Long term (current) use of inhaled steroids: Secondary | ICD-10-CM | POA: Diagnosis not present

## 2022-06-05 DIAGNOSIS — Z79899 Other long term (current) drug therapy: Secondary | ICD-10-CM | POA: Diagnosis not present

## 2022-06-05 DIAGNOSIS — R0789 Other chest pain: Secondary | ICD-10-CM | POA: Diagnosis not present

## 2022-06-05 DIAGNOSIS — R0602 Shortness of breath: Secondary | ICD-10-CM | POA: Insufficient documentation

## 2022-06-05 DIAGNOSIS — Z7984 Long term (current) use of oral hypoglycemic drugs: Secondary | ICD-10-CM | POA: Insufficient documentation

## 2022-06-05 DIAGNOSIS — I10 Essential (primary) hypertension: Secondary | ICD-10-CM | POA: Insufficient documentation

## 2022-06-05 LAB — CBC WITH DIFFERENTIAL/PLATELET
Abs Immature Granulocytes: 0.02 10*3/uL (ref 0.00–0.07)
Basophils Absolute: 0 10*3/uL (ref 0.0–0.1)
Basophils Relative: 1 %
Eosinophils Absolute: 0.2 10*3/uL (ref 0.0–0.5)
Eosinophils Relative: 3 %
HCT: 32.9 % — ABNORMAL LOW (ref 36.0–46.0)
Hemoglobin: 10.6 g/dL — ABNORMAL LOW (ref 12.0–15.0)
Immature Granulocytes: 0 %
Lymphocytes Relative: 31 %
Lymphs Abs: 2.4 10*3/uL (ref 0.7–4.0)
MCH: 24.4 pg — ABNORMAL LOW (ref 26.0–34.0)
MCHC: 32.2 g/dL (ref 30.0–36.0)
MCV: 75.6 fL — ABNORMAL LOW (ref 80.0–100.0)
Monocytes Absolute: 0.7 10*3/uL (ref 0.1–1.0)
Monocytes Relative: 8 %
Neutro Abs: 4.6 10*3/uL (ref 1.7–7.7)
Neutrophils Relative %: 57 %
Platelets: 333 10*3/uL (ref 150–400)
RBC: 4.35 MIL/uL (ref 3.87–5.11)
RDW: 13.8 % (ref 11.5–15.5)
WBC: 8 10*3/uL (ref 4.0–10.5)
nRBC: 0 % (ref 0.0–0.2)

## 2022-06-05 LAB — BASIC METABOLIC PANEL
Anion gap: 10 (ref 5–15)
BUN: 9 mg/dL (ref 6–20)
CO2: 22 mmol/L (ref 22–32)
Calcium: 8.8 mg/dL — ABNORMAL LOW (ref 8.9–10.3)
Chloride: 104 mmol/L (ref 98–111)
Creatinine, Ser: 0.76 mg/dL (ref 0.44–1.00)
GFR, Estimated: >60 mL/min (ref >60)
Glucose, Bld: 218 mg/dL — ABNORMAL HIGH (ref 70–99)
Potassium: 3.5 mmol/L (ref 3.5–5.1)
Sodium: 136 mmol/L (ref 135–145)

## 2022-06-05 LAB — TROPONIN I (HIGH SENSITIVITY): Troponin I (High Sensitivity): 3 ng/L (ref <18)

## 2022-06-05 NOTE — ED Triage Notes (Signed)
Pt BIB [facility] from home c/o dizziness and chest pain that started around 4 pm today while at rest. Pt continues to complain pain 3/10
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
## 2022-06-05 NOTE — ED Provider Triage Note (Signed)
Emergency Medicine Provider Triage Evaluation Note  AHRIANA GUNKEL , a 42 y.o. female  was evaluated in triage.  Pt complains of chest pain and dizziness that started around 4 PM today.  No history of blood clots.  History of asthma.  Denies lower extremity edema.  Review of Systems  Positive: CP Negative: fever  Physical Exam  BP (!) 164/112   Pulse 80   Temp 98.7 F (37.1 C) (Oral)   Resp (!) 24   Ht '5\' 4"'$  (1.626 m)   Wt 128.4 kg   SpO2 99%   BMI 48.58 kg/m  Gen:   Awake, no distress   Resp:  Normal effort  MSK:   Moves extremities without difficulty  Other:    Medical Decision Making  Medically screening exam initiated at 7:19 PM.  Appropriate orders placed.  RONASIA ISOLA was informed that the remainder of the evaluation will be completed by another provider, this initial triage assessment does not replace that evaluation, and the importance of remaining in the ED until their evaluation is complete.  Cardiac labs   Karie Kirks 06/05/22 [id number]

## 2022-06-06 LAB — TROPONIN I (HIGH SENSITIVITY): Troponin I (High Sensitivity): 3 ng/L (ref <18)

## 2022-06-06 LAB — D-DIMER, QUANTITATIVE: D-Dimer, Quant: 0.43 ug/mL-FEU (ref 0.00–0.50)

## 2022-06-06 MED ORDER — LACTATED RINGERS IV BOLUS
1000.0000 mL | Freq: Once | INTRAVENOUS | Status: AC
  Administered 2022-06-06: 1000 mL via INTRAVENOUS

## 2022-06-06 MED ORDER — SPIRONOLACTONE 25 MG PO TABS
25.0000 mg | ORAL_TABLET | Freq: Once | ORAL | Status: AC
  Administered 2022-06-06: 25 mg via ORAL
  Filled 2022-06-06: qty 1

## 2022-06-06 MED ORDER — CARVEDILOL 12.5 MG PO TABS
25.0000 mg | ORAL_TABLET | Freq: Once | ORAL | Status: AC
  Administered 2022-06-06: 25 mg via ORAL
  Filled 2022-06-06: qty 2

## 2022-06-06 MED ORDER — IRBESARTAN 300 MG PO TABS
300.0000 mg | ORAL_TABLET | Freq: Once | ORAL | Status: AC
  Administered 2022-06-06: 300 mg via ORAL
  Filled 2022-06-06: qty 1

## 2022-06-06 NOTE — Discharge Instructions (Signed)
If you develop recurrent, continued, or worsening chest pain, shortness of breath, fever, vomiting, abdominal or back pain, or any other new/concerning symptoms then return to the ER for evaluation.  

## 2022-06-06 NOTE — ED Provider Notes (Signed)
[facility] EMERGENCY DEPARTMENT Provider Note   CSN: [id number] Arrival date & time: 06/05/22  1825     History  Chief Complaint  Patient presents with   Chest Pain    Victoria [name] is a 42 y.o. female.  HPI 42 year old female with a history of asthma, hypertension, type 2 diabetes presents with chest pain and dizziness.  Started yesterday afternoon while in the car.  She primarily felt the lightheadedness as well as a chest pain that did not feel like a tightness but is hard to describe.  The pain has been pretty much constant since yesterday afternoon.  The lightheadedness has waxed and waned but also been fairly constant.  Feels like she is going to pass out but she has not passed out.  She also feels short of breath.  No specific pleuritic pain or leg swelling or recent travel.  She reports compliance with her medications including her blood pressure meds though has not taken them this morning because she has been in the waiting room 13+ hours.  No cough save for a couple days ago when she felt like she had an asthma attack but that seems resolved.  Home Medications Prior to Admission medications   Medication Sig Start Date End Date Taking? Authorizing Provider  acetaminophen (TYLENOL) 500 MG tablet Take 1,000 mg by mouth as needed for moderate pain. Patient not taking: Reported on 04/17/2022    [provider]  albuterol (PROVENTIL) (2.5 MG/3ML) 0.083% nebulizer solution Take 2.5 mg by nebulization as needed for wheezing or shortness of breath. Patient not taking: Reported on 09/05/2021    [provider]  albuterol (VENTOLIN HFA) 108 (90 Base) MCG/ACT inhaler INHALE 1-2 PUFFS INTO THE LUNGS EVERY 6 (SIX) HOURS AS NEEDED FOR WHEEZING OR SHORTNESS OF BREATH. 04/03/20 12/27/21  Providence Lanius A, PA-C  atorvastatin (LIPITOR) 20 MG tablet Take 1 tablet (20 mg total) by mouth daily. 01/07/22   Rita Ohara, MD  carvedilol (COREG) 25 MG tablet Take 1  tablet (25 mg total) by mouth 2 (two) times daily. 03/18/22   Rita Ohara, MD  Cholecalciferol (VITAMIN D) 50 MCG (2000 UT) CAPS Take 1 capsule by mouth daily.    [provider]  Continuous Blood Gluc Sensor (FREESTYLE LIBRE 2 SENSOR) MISC 1 each by Does not apply route every 14 (fourteen) days. Patient not taking: Reported on 04/17/2022 12/27/21   Rita Ohara, MD  fluticasone [place]) 50 MCG/ACT nasal spray Place 2 sprays into both nostrils daily. Patient not taking: Reported on 04/17/2022 01/29/22   Brunetta Jeans, PA-C  ibuprofen (ADVIL) 600 MG tablet Take 1 tablet (600 mg total) by mouth every 8 (eight) hours as needed. Patient not taking: Reported on 04/17/2022 02/27/22   Mar Daring, PA-C  loratadine (CLARITIN) 10 MG tablet Take 10 mg by mouth daily. Patient not taking: Reported on 04/17/2022    [provider]  metFORMIN (GLUCOPHAGE) 1000 MG tablet Take 1 tablet (1,000 mg total) by mouth 2 (two) times daily with a meal. 04/17/22   Rita Ohara, MD  Multiple Vitamins-Minerals (ONE A DAY WOMEN 50 PLUS PO) Take 1 tablet by mouth daily.    [provider]  Semaglutide (RYBELSUS) 14 MG TABS Take 1 tablet (14 mg total) by mouth daily. 05/03/22   Rita Ohara, MD  spironolactone (ALDACTONE) 25 MG tablet TAKE 1 TABLET (25 MG TOTAL) BY MOUTH DAILY. 01/07/22 01/07/23  Rita Ohara, MD  valsartan (DIOVAN) 320 MG tablet Take 1  tablet (320 mg total) by mouth daily. 01/07/22   Rita Ohara, MD      Allergies    Labetalol, Dilaudid [hydromorphone hcl], Morphine and related, Peanut-containing drug products, and Strawberry extract    Review of Systems   Review of Systems  Respiratory:  Positive for shortness of breath. Negative for cough.   Cardiovascular:  Positive for chest pain. Negative for leg swelling.  Neurological:  Positive for light-headedness.    Physical Exam Updated Vital Signs BP (!) 159/100 (BP Location: Right Arm)   Pulse 68   Temp 98.3 F (36.8 C)   Resp  17   Ht '5\' 4"'$  (1.626 m)   Wt 128.4 kg   SpO2 100%   BMI 48.58 kg/m  Physical Exam Vitals and nursing note reviewed.  Constitutional:      General: She is not in acute distress.    Appearance: She is well-developed. She is obese. She is not ill-appearing or diaphoretic.  HENT:     Head: Normocephalic and atraumatic.  Cardiovascular:     Rate and Rhythm: Normal rate and regular rhythm.     Heart sounds: Normal heart sounds.  Pulmonary:     Effort: Pulmonary effort is normal.     Breath sounds: Normal breath sounds.  Abdominal:     Palpations: Abdomen is soft.     Tenderness: There is no abdominal tenderness.  Musculoskeletal:     Right lower leg: No edema.     Left lower leg: No edema.  Skin:    General: Skin is warm and dry.  Neurological:     Mental Status: She is alert.     ED Results / Procedures / Treatments   Labs (all labs ordered are listed, but only abnormal results are displayed) Labs Reviewed  CBC WITH DIFFERENTIAL/PLATELET - Abnormal; Notable for the following components:      Result Value   Hemoglobin 10.6 (*)    HCT 32.9 (*)    MCV 75.6 (*)    MCH 24.4 (*)    All other components within normal limits  BASIC METABOLIC PANEL - Abnormal; Notable for the following components:   Glucose, Bld 218 (*)    Calcium 8.8 (*)    All other components within normal limits  D-DIMER, QUANTITATIVE  TROPONIN I (HIGH SENSITIVITY)  TROPONIN I (HIGH SENSITIVITY)    EKG EKG Interpretation  Date/Time:  Wednesday June 05 2022 19:11:41 EST Ventricular Rate:  78 PR Interval:  140 QRS Duration: 98 QT Interval:  400 QTC Calculation: 456 R Axis:   89 Text Interpretation: Normal sinus rhythm no acute ST/T changes similar to July 2023 Confirmed by Sherwood Gambler [phone]) on 06/06/2022 7:54:14 AM  Radiology DG Chest 1 View  Result Date: 06/05/2022 CLINICAL DATA:  Chest pain EXAM: CHEST  1 VIEW COMPARISON:  None Available. FINDINGS: The heart size and mediastinal  contours are within normal limits. Both lungs are clear. The visualized skeletal structures are unremarkable. IMPRESSION: No active disease. Electronically Signed   By: Keane Police D.O.   On: 06/05/2022 20:11    Procedures Procedures    Medications Ordered in ED Medications  lactated ringers bolus 1,000 mL (0 mLs Intravenous Stopped 06/06/22 0945)  carvedilol (COREG) tablet 25 mg (25 mg Oral Given 06/06/22 0834)  irbesartan (AVAPRO) tablet 300 mg (300 mg Oral Given 06/06/22 0834)  spironolactone (ALDACTONE) tablet 25 mg (25 mg Oral Given 06/06/22 0539)    ED Course/ Medical Decision Making/ A&P  Medical Decision Making Amount and/or Complexity of Data Reviewed Labs: ordered.    Details: Troponins negative x 2.  Chronic anemia.  Hyperglycemia without acidosis or renal failure. Radiology: independent interpretation performed.    Details: No CHF ECG/medicine tests: independent interpretation performed.    Details: No acute ischemia  Risk Prescription drug management.   Patient presents with chest pain and lightheadedness.  No syncope.  Vital signs have been okay here although she is on a beta-blocker so a D-dimer was sent for otherwise low risk/suspicion PE.  This is negative and I do not think CTA is warranted.  Troponins are negative.  No acute ischemia on the EKG.  X-ray is okay.  Unclear why she is having her symptoms.  She does have significantly elevated blood pressure and she reports compliance with her meds and so she was given her morning meds and I think she needs to follow-up closely with PCP to further titrate her blood pressure.  Otherwise I have low suspicion she has ACS despite her risk factors such as hypertension and diabetes.  Doubt aortic dissection.  I do not think she needs admission but instead can follow-up with PCP.  Will give return precautions.        Final Clinical Impression(s) / ED Diagnoses Final diagnoses:  Nonspecific chest  pain    Rx / DC Orders ED Discharge Orders     None         Sherwood Gambler, MD 06/06/22 [phone]

## 2022-06-07 ENCOUNTER — Telehealth: Payer: Self-pay | Admitting: Family Medicine

## 2022-06-07 NOTE — Telephone Encounter (Signed)
Transition Care Management Follow-up Telephone Call Date of discharge and from where: 06/06/2022 [facility] How have you been since you were released from the hospital? A little better Any questions or concerns? {YES/NO TOC TRACKING USE FOR MANAGED MEDICAID REPORTING:24185}  Items Reviewed: Did the pt receive and understand the discharge instructions provided? {YES/NO:21197} Medications obtained and verified? {YES/NO:21197} Other? {YES/NO:21197} Any new allergies since your discharge? {YES/NO:21197} Dietary orders reviewed? {YES/NO TITLE CASE:22902} Do you have support at home? {YES/NO:21197}  Home Care and Equipment/Supplies: Were home health services ordered? {Response; yes/no/na:63} If so, what is the name of the agency? ***  Has the agency set up a time to come to the patient's home? {Response; yes/no/na:63} Were any new equipment or medical supplies ordered?  {LMB/EM:[id number]} What is the name of the medical supply agency? *** Were you able to get the supplies/equipment? {Response; yes/no/na:63} Do you have any questions related to the use of the equipment or supplies? {OFH/QR:975883254}  Functional Questionnaire: (I = Independent and D = Dependent) ADLs: ***  Bathing/Dressing- ***  Meal Prep- ***  Eating- ***  Maintaining continence- ***  Transferring/Ambulation- ***  Managing Meds- ***  Follow up appointments reviewed:  PCP Hospital f/u appt confirmed? {YES/NO:21197} Scheduled to see *** on *** @ ***. [facility] f/u appt confirmed? {YES/NO:21197} Scheduled to see *** on *** @ ***. Are transportation arrangements needed? {YES/NO:21197} If their condition worsens, is the pt aware to call PCP or go to the Emergency Dept.? {YES/NO TITLE CASE:22902} Was the patient provided with contact information for the PCP's office or ED? {YES/NO TITLE CASE:22902} Was to pt encouraged to call back with questions or concerns? {YES/NO TITLE CASE:22902}

## 2022-06-09 NOTE — Progress Notes (Unsigned)
No chief complaint on file.  Patient presents for ER follow-up. She was seen 1/10 with  chest pain and dizziness. Started yesterday afternoon while in the car. She primarily felt the lightheadedness as well as a chest pain that did not feel like a tightness but is hard to describe. The pain has been pretty much constant since yesterday afternoon. The lightheadedness has waxed and waned but also been fairly constant. Feels like she is going to pass out but she has not passed out. She also feels short of breath. No specific pleuritic pain or leg swelling or recent travel. She reports compliance with her medications including her blood pressure meds though has not taken them this morning because she has been in the waiting room 13+ hours. No cough save for a couple days ago when she felt like she had an asthma attack but that seems resolved.   Normal EKG, CXR. She had normal D-dimer, troponins x 2 and b-met (except for nonfasting glu 218).  CBC showed stable anemia: Lab Results  Component Value Date   WBC 8.0 06/05/2022   HGB 10.6 (L) 06/05/2022   HCT 32.9 (L) 06/05/2022   MCV 75.6 (L) 06/05/2022   PLT 333 06/05/2022    BP was elevated. Home meds given in the ER (had been in ER so long, was late in taking).  They recommended close f/u with PCP. Unclear etiology of CP and dizziness.   Diabetes--Rybelsus got authorized, so switched back from [place], to Rybelsus. She was given '3mg'$  samples, to restart at '3mg'$  for a week, then 2 tablets ('6mg'$ ) for a week, then increase to the 14 mg.  She is currently taking 14 mg. Sugars are running   PMH, PSH, SH reviewed   ROS:   PHYSICAL EXAM:  There were no vitals taken for this visit.  Wt Readings from Last 3 Encounters:  06/05/22 283 lb (128.4 kg)  04/17/22 283 lb 3.2 oz (128.5 kg)  12/27/21 290 lb 9.6 oz (131.8 kg)      ASSESSMENT/PLAN:  VERIFY THAT SHE IS TAKING ALL MEDS AS RX'd, vs missed doses?   Scheduled 2/15 for med check.

## 2022-06-10 ENCOUNTER — Ambulatory Visit (INDEPENDENT_AMBULATORY_CARE_PROVIDER_SITE_OTHER): Payer: 59 | Admitting: Family Medicine

## 2022-06-10 ENCOUNTER — Encounter: Payer: Self-pay | Admitting: Family Medicine

## 2022-06-10 VITALS — BP 132/86 | HR 80 | Ht 64.0 in | Wt 285.6 lb

## 2022-06-10 DIAGNOSIS — E118 Type 2 diabetes mellitus with unspecified complications: Secondary | ICD-10-CM

## 2022-06-10 DIAGNOSIS — D509 Iron deficiency anemia, unspecified: Secondary | ICD-10-CM

## 2022-06-10 DIAGNOSIS — R079 Chest pain, unspecified: Secondary | ICD-10-CM

## 2022-06-10 DIAGNOSIS — F439 Reaction to severe stress, unspecified: Secondary | ICD-10-CM

## 2022-06-10 DIAGNOSIS — R42 Dizziness and giddiness: Secondary | ICD-10-CM

## 2022-06-10 DIAGNOSIS — I10 Essential (primary) hypertension: Secondary | ICD-10-CM

## 2022-06-10 DIAGNOSIS — R69 Illness, unspecified: Secondary | ICD-10-CM | POA: Diagnosis not present

## 2022-06-10 NOTE — Patient Instructions (Addendum)
Please double up on the 3 mg rybelsus samples that you have.  Take the '6mg'$  for about a week, and then start the prescription '14mg'$  dose.   Please try and monitor your sugars, and make sure these come down.  Continue to monitor your blood pressure. Bring your list of sugars and blood pressures to your visit.

## 2022-06-24 ENCOUNTER — Encounter (HOSPITAL_COMMUNITY): Payer: Self-pay | Admitting: *Deleted

## 2022-06-24 ENCOUNTER — Other Ambulatory Visit: Payer: Self-pay

## 2022-06-24 ENCOUNTER — Ambulatory Visit (HOSPITAL_COMMUNITY)
Admission: EM | Admit: 2022-06-24 | Discharge: 2022-06-24 | Disposition: A | Discharge status: Home / Self Care | Payer: Commercial Managed Care - HMO | Attending: Physician Assistant | Admitting: Physician Assistant

## 2022-06-24 DIAGNOSIS — J4521 Mild intermittent asthma with (acute) exacerbation: Secondary | ICD-10-CM | POA: Diagnosis not present

## 2022-06-24 DIAGNOSIS — B349 Viral infection, unspecified: Secondary | ICD-10-CM | POA: Diagnosis not present

## 2022-06-24 DIAGNOSIS — Z1152 Encounter for screening for COVID-19: Secondary | ICD-10-CM | POA: Diagnosis not present

## 2022-06-24 LAB — POC INFLUENZA A AND B ANTIGEN (URGENT CARE ONLY)
INFLUENZA A ANTIGEN, POC: NEGATIVE
INFLUENZA B ANTIGEN, POC: NEGATIVE

## 2022-06-24 MED ORDER — IPRATROPIUM-ALBUTEROL 0.5-2.5 (3) MG/3ML IN SOLN
3.0000 mL | Freq: Once | RESPIRATORY_TRACT | Status: AC
  Administered 2022-06-24: 3 mL via RESPIRATORY_TRACT

## 2022-06-24 MED ORDER — ALBUTEROL SULFATE (2.5 MG/3ML) 0.083% IN NEBU: 2.5000 mg | INHALATION_SOLUTION | RESPIRATORY_TRACT | 1 refills | Status: DC | PRN

## 2022-06-24 MED ORDER — PREDNISONE 20 MG PO TABS: 40.0000 mg | ORAL_TABLET | Freq: Every day | ORAL | 0 refills | Status: AC

## 2022-06-24 MED ORDER — IPRATROPIUM-ALBUTEROL 0.5-2.5 (3) MG/3ML IN SOLN
RESPIRATORY_TRACT | Status: AC
  Filled 2022-06-24: qty 3

## 2022-06-24 NOTE — ED Triage Notes (Signed)
Pt reports SHOB,sore throat and cough

## 2022-06-24 NOTE — Discharge Instructions (Signed)
Use inhaler as needed.  I have refilled your nebulizer solution to use as needed for wheezing and shortness of breath.  Follow up with your primary care physician.  Recommend Flonase and Mucinex.  Your rapid flu test is negative.  We will call if COVID test is positive.
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
## 2022-06-25 LAB — SARS CORONAVIRUS 2 (TAT 6-24 HRS): SARS Coronavirus 2: NEGATIVE

## 2022-06-30 ENCOUNTER — Other Ambulatory Visit: Payer: 59

## 2022-07-06 ENCOUNTER — Emergency Department (HOSPITAL_BASED_OUTPATIENT_CLINIC_OR_DEPARTMENT_OTHER)
Admission: EM | Admit: 2022-07-06 | Discharge: 2022-07-07 | Disposition: A | Discharge status: Home / Self Care | Payer: 59 | Attending: Emergency Medicine | Admitting: Emergency Medicine

## 2022-07-06 ENCOUNTER — Other Ambulatory Visit: Payer: Self-pay

## 2022-07-06 DIAGNOSIS — Z7951 Long term (current) use of inhaled steroids: Secondary | ICD-10-CM | POA: Diagnosis not present

## 2022-07-06 DIAGNOSIS — J069 Acute upper respiratory infection, unspecified: Secondary | ICD-10-CM | POA: Diagnosis not present

## 2022-07-06 DIAGNOSIS — J45909 Unspecified asthma, uncomplicated: Secondary | ICD-10-CM | POA: Insufficient documentation

## 2022-07-06 DIAGNOSIS — Z7984 Long term (current) use of oral hypoglycemic drugs: Secondary | ICD-10-CM | POA: Diagnosis not present

## 2022-07-06 DIAGNOSIS — U071 COVID-19: Secondary | ICD-10-CM | POA: Insufficient documentation

## 2022-07-06 DIAGNOSIS — Z91010 Allergy to peanuts: Secondary | ICD-10-CM | POA: Diagnosis not present

## 2022-07-06 DIAGNOSIS — E119 Type 2 diabetes mellitus without complications: Secondary | ICD-10-CM | POA: Insufficient documentation

## 2022-07-06 DIAGNOSIS — Z79899 Other long term (current) drug therapy: Secondary | ICD-10-CM | POA: Diagnosis not present

## 2022-07-06 DIAGNOSIS — I10 Essential (primary) hypertension: Secondary | ICD-10-CM | POA: Diagnosis not present

## 2022-07-06 DIAGNOSIS — R519 Headache, unspecified: Secondary | ICD-10-CM | POA: Diagnosis not present

## 2022-07-06 LAB — RESP PANEL BY RT-PCR (RSV, FLU A&B, COVID)  RVPGX2
Influenza A by PCR: NEGATIVE
Influenza B by PCR: NEGATIVE
Resp Syncytial Virus by PCR: NEGATIVE
SARS Coronavirus 2 by RT PCR: POSITIVE — AB

## 2022-07-06 NOTE — ED Triage Notes (Signed)
Pt states that she has had body aches, headache, productive cough, and congestion x 1 week with worsening x 1 day. Pt has history of asthma and home nebs and inhalers are little help for her SOB.

## 2022-07-07 ENCOUNTER — Other Ambulatory Visit: Payer: Self-pay | Admitting: Family Medicine

## 2022-07-07 DIAGNOSIS — I10 Essential (primary) hypertension: Secondary | ICD-10-CM

## 2022-07-07 NOTE — ED Notes (Signed)
RT note: Pt. given I/S per order, explained/educated on importance/frequency of use along with using her Albuterol inhaler @ home along with Spacer, stated understanding, able to use independently.
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
## 2022-07-07 NOTE — ED Notes (Signed)
Pt provided DC instructions; no further questions upon discharge and pt was able to ambulate independently out of department.

## 2022-07-08 NOTE — Telephone Encounter (Signed)
Pt needed a refill on this but did not schedule an appt and said she would call back

## 2022-07-11 ENCOUNTER — Encounter: Payer: Commercial Managed Care - HMO | Admitting: Family Medicine

## 2022-07-25 ENCOUNTER — Encounter: Payer: Commercial Managed Care - HMO | Admitting: Family Medicine

## 2022-08-11 ENCOUNTER — Telehealth: Payer: 59 | Admitting: Physician Assistant

## 2022-08-11 DIAGNOSIS — K047 Periapical abscess without sinus: Secondary | ICD-10-CM | POA: Diagnosis not present

## 2022-08-11 MED ORDER — IBUPROFEN 600 MG PO TABS: 600.0000 mg | ORAL_TABLET | Freq: Three times a day (TID) | ORAL | 0 refills | Status: DC | PRN

## 2022-08-11 MED ORDER — PENICILLIN V POTASSIUM 500 MG PO TABS: 500.0000 mg | ORAL_TABLET | Freq: Three times a day (TID) | ORAL | 0 refills | Status: AC

## 2022-08-11 NOTE — Progress Notes (Signed)

## 2022-09-09 ENCOUNTER — Other Ambulatory Visit: Payer: Self-pay | Admitting: Family Medicine

## 2022-09-09 DIAGNOSIS — I10 Essential (primary) hypertension: Secondary | ICD-10-CM

## 2022-09-09 DIAGNOSIS — I5189 Other ill-defined heart diseases: Secondary | ICD-10-CM

## 2022-09-09 NOTE — Telephone Encounter (Signed)
Left message for patient to call and schedule appt and I can fill to hold her.

## 2022-09-12 ENCOUNTER — Telehealth: Payer: Self-pay | Admitting: Family Medicine

## 2022-09-12 NOTE — Telephone Encounter (Signed)
RYBELSUS PA SENT TO COVER MY MEDS

## 2022-09-26 ENCOUNTER — Telehealth: Payer: Self-pay | Admitting: Family Medicine

## 2022-09-26 NOTE — Telephone Encounter (Signed)
P.A. RYBELSUS  

## 2022-11-06 NOTE — Patient Instructions (Addendum)
  HEALTH MAINTENANCE RECOMMENDATIONS:  It is recommended that you get at least 30 minutes of aerobic exercise at least 5 days/week (for weight loss, you may need as much as 60-90 minutes). This can be any activity that gets your heart rate up. This can be divided in 10-15 minute intervals if needed, but try and build up your endurance at least once a week.  Weight bearing exercise is also recommended twice weekly.  Eat a healthy diet with lots of vegetables, fruits and fiber.  "Colorful" foods have a lot of vitamins (ie green vegetables, tomatoes, red peppers, etc).  Limit sweet tea, regular sodas and alcoholic beverages, all of which has a lot of calories and sugar.  Up to 1 alcoholic drink daily may be beneficial for women (unless trying to lose weight, watch sugars).  Drink a lot of water.  Calcium recommendations are 1200-1500 mg daily (1500 mg for postmenopausal women or women without ovaries), and vitamin D 1000 IU daily.  This should be obtained from diet and/or supplements (vitamins), and calcium should not be taken all at once, but in divided doses.  Monthly self breast exams and yearly mammograms for women over the age of 3 is recommended.  Sunscreen of at least SPF 30 should be used on all sun-exposed parts of the skin when outside between the hours of 10 am and 4 pm (not just when at beach or pool, but even with exercise, golf, tennis, and yard work!)  Use a sunscreen that says "broad spectrum" so it covers both UVA and UVB rays, and make sure to reapply every 1-2 hours.  Remember to change the batteries in your smoke detectors when changing your clock times in the spring and fall. Carbon monoxide detectors are recommended for your home.  Use your seat belt every time you are in a car, and please drive safely and not be distracted with cell phones and texting while driving.  Continue yearly flu shots. I recommend COVID booster when the updated one comes out in the Fall.  Please follow  up with a gynecologist for your breast and pelvic exams, to discuss iron deficiency anemia (previously noted, rechecking today) and treatment of your very heavy cycles. You likely need to be taking iron regularly (and perhaps colace stool softener and/or Miralax to control the constipation).  We will let you know based on lab results. Previously your hemoglobin was still low when taking your multivitamin--that doesn't have enough iron.  You will be due for a mammogram when you turn 42. You can schedule this yourself at the breast center.  Please call the hypertension clinic and schedule an appointment. We discussed the importance of getting your blood pressure and diabetes under control such that we can prevent complications (including kidney failure).  Please start checking your blood regularly, and keep a record of this (along with your sugars). Consider bringing your monitor and list of BP's to the visit with the cardiologist.  Try wearing a mask when you cut the grass. You also may want to take an antihistamine like claritin beforehand.  Use heat to the sore neck/shoulder muscles. After the heat, do some stretches and massage. If your kidneys are okay on your blood test, you can take some aleve or ibuprofen with food, as needed for the pain. I've sent in a prescription for a muscle relaxant to use as needed. PT is an option if not improving. You can try using a Tens unit to help.

## 2022-11-06 NOTE — Progress Notes (Signed)
Chief Complaint  Patient presents with   Annual Exam    Fasting annual no pap, sees Dr Jackelyn Knife and has not been in about 4 years, will schedule. Has appt with [facility] July 26 for DM eye exam. She called about Rybelsus not being covered-looks like 2 phone calls in system 4/18 and 5/2 about PA but doesn't say whether or not it was ever denied or covered. Patient on cycle, did not give UA for dip - is it ok for microalb, if so will give on way out. She is taking all meds currently. Left should pain x week.    Victoria [name] is a 42 y.o. female who presents for a complete physical and follow-up on chronic problems.   She is past due for follow-up.  She had cancelled her 06/2022 visit.  Has had a lot going on--husband's car accident, ultimately required amputation of his leg in 07/2022, father had also been in hospital.  She has the following concerns: Having issues with her left shoulder for a week. She may have slept on it wrong.  She lifts her husband's wheelchair, unsure if she could have strained it.  Diabetes:  Sugars were poorly controlled at her last med check in 03/2022, A1c 10.2%. At that time she was having issues with her medications and insurance coverage.  She had run out of Xigduo, wasn't able to get Rybelsus or Bydureon.   At her last visit she was put back on Rybelsus 14 mg daily.  Her insurance changed after January visit, and hasn't gotten any Rybelsus since then. Davonna Belling was too expensive and caused yeast infections, so in 03/2022 she was switched to metformin 1000 mg BID. She admits to taking metformin only once daily due to diarrhea. If she doesn't miss any doses, and remembers to take it BID, the diarrhea doesn't occur.  She hadn't been as good about taking it, until she saw her sugars recently. She put a new Freestyle Libre monitor on last week.  Readings were "high". She increased the metformin to BID. Sugars improved to 200's.  She has since made some dietary changes (cut  back on carbs), and is now <200.  Mornings are still close to 200. Like [place], but it was $70/month. She is now able to afford this. She currently has the [place] 2, prefers to go back to the 3. She denies hypoglycemia, polydipsia, polyuria. Last eye exam was in 11/2020, no retinopathy. Exam is scheduled for 11/2022. She checks her feet regularly, denies concerns.    She previously didn't tolerate Trulicity due to nausea, belching.  She prefers to stay on Rybelsus.    Lab Results  Component Value Date   HGBA1C 10.2 (A) 04/17/2022   Diet--just recently switched to zuchini noodles instead of pasta.  She just cut out bread. She has eaten a lot of fast food in the last few months (when husband and father in the hospital). Making better choices when having to eat out recently.   Had "aha" moment when she put the [place] sensor back on, and has been doing better.  Hypertension:  She currently reports compliance with valsartan 320mg , carvedilol 25mg  BID, and spironolactone 25mg  daily. She had run out of spironolactone for a couple of months. Realized this a couple of weeks ago, and got it refilled. Other than that, reports compliance with other meds, having a lot of extra meds at home from prior doc (refill history suggested she should have been out of meds, is  not). Hasn't been monitoring her BP's. She has a monitor at home. Has been busy and stressed.  Currently she denies headaches, dizziness, chest pain, shortness of breath, edema, muscle cramps.  She previously was seen in HTN clinic by Dr. Duke Salvia (02/2020).  Renal artery [place] was recommended, and checking for hyperaldosteronism was also planned. Pt was noncompliant in f/u. She was referred back, encouraged her to schedule visit there at every appt here, but hasn't. She cancelled 02/2022 visit.  Has not rescheduled.  BP Readings from Last 3 Encounters:  11/07/22 (!) 150/100  07/07/22 (!) 160/104  06/24/22 (!) 152/101     Vitamin D  deficiency:  Last level was 30.3 in 12/2021, when taking 2000 U daily.  It had been consistently low prior to that. She is currently taking 4000 IU daily.  Component Ref Range & Units 10 mo ago (12/27/21) 2 yr ago (03/13/20) 2 yr ago (12/08/19) 3 yr ago (08/25/19) 3 yr ago (01/07/19) 4 yr ago (08/20/18) 6 yr ago (05/13/16)  Vit D, 25-Hydroxy 30.0 - 100.0 ng/mL 30.3 29.7 Low  CM 29.6 Low  CM 18.4 Low  CM 18.1 Low  CM 13.9 Low  CM 28 Low  R, CM    Hyperlipidemia:  She is taking atorvastatin 20mg  and denies side effects. Still using condoms for contraception, and aware of the potential risks of statins and pregnancy.  Previously reported planning to see OB-GYN to discuss BTL. She hasn't scheduled this appointment (and is long past due for GYN exam).   Last lipids were at goal. Lab Results  Component Value Date   CHOL 161 04/17/2022   HDL 58 04/17/2022   LDLCALC 85 04/17/2022   TRIG 99 04/17/2022   CHOLHDL 2.8 04/17/2022     Anemia: She reports that her menses remain heavy, monthly (sometimes 2x/month). She hadn't seen GYN in 4 years, Dr. Jackelyn Knife had stopped taking her insurance, but takes her current insurance. She has been taking a MVI with iron, no separate iron.   Lab Results  Component Value Date   WBC 8.0 06/05/2022   HGB 10.6 (L) 06/05/2022   HCT 32.9 (L) 06/05/2022   MCV 75.6 (L) 06/05/2022   PLT 333 06/05/2022    Allergies and asthma: She has had many UC/ER visit for wheezing in the past  She was referred to allergist through [facility], but never saw them, as her insurance had changed.  Referral was placed upon returning care here, they tried contacting her 3x, then closed it, never got scheduled. Overall she reports doing well. She had a flare of asthma after cutting the grass last week. She is almost out of albuterol. It helped a lot. Needs a refill.  She had COVID in 06/2022.  Immunization History  Administered Date(s) Administered   Influenza,inj,Quad PF,6+ Mos  01/18/2015, 03/10/2017, 03/13/2020, 04/17/2022   Influenza-Unspecified 03/05/2016, 02/27/2018, 02/20/2019, 04/05/2021   PFIZER(Purple Top)SARS-COV-2 Vaccination 01/05/2020, 01/27/2020   Pneumococcal Polysaccharide-23 08/20/2018   Tdap 05/03/2009, 11/20/2012, 02/12/2016   Declines COVID boosters. Last Pap smear: ?? Through Dr. Jackelyn Knife, hasn't seen in 3-4 years Last mammogram: never Last colonoscopy: never Last DEXA: n/a Dentist: doesn't have dental insurance.  Last went a year ago. She is having a lot of dental issues and needs extractions. She has contacted [facility] dental [facility] and is waiting to see if they will do her extractions.  She is looking to get dental insurance for herself and her family. Ophtho: 11/2020, scheduled for 11/2022. Exercise: not regularly.   PMH, PSH,  SH and FH were reviewed and updated   Review of Systems  Constitutional: Negative.  Negative for chills, fever and weight loss.  HENT:  Negative for congestion and hearing loss.   Eyes: Negative.   Respiratory:  Negative for cough, shortness of breath and wheezing.   Cardiovascular:  Negative for chest pain, palpitations and leg swelling.  Gastrointestinal:  Negative for abdominal pain, blood in stool, constipation, diarrhea, melena, nausea and vomiting.  Genitourinary:  Negative for dysuria, frequency, hematuria and urgency.  Musculoskeletal:  Negative for joint pain.  Skin:  Negative for rash.  Neurological:  Negative for dizziness, tingling, tremors, weakness and headaches.  Endo/Heme/Allergies:  Negative for environmental allergies. Does not bruise/bleed easily.  Psychiatric/Behavioral:  Negative for depression and memory loss. The patient is not nervous/anxious and does not have insomnia.    Mild allergies. Asthma flare with grass-cutting recently (used to be her husband's job), no issues currently. Recent mild nausea related to the start of her cycle On cycle now--heavy, cramping today. No further  vertigo. No current dental issues--occasional, needs a lot of dental work. Recent ABX through MyChart.    PHYSICAL EXAM:  BP (!) 150/100   Pulse 72   Ht 5\' 4"  (1.626 m)   Wt 280 lb 6.4 oz (127.2 kg)   LMP 11/06/2022   BMI 48.13 kg/m   140/98 on repeat by MD  Wt Readings from Last 3 Encounters:  11/07/22 280 lb 6.4 oz (127.2 kg)  07/06/22 280 lb (127 kg)  06/10/22 285 lb 9.6 oz (129.5 kg)   General Appearance:    Alert, cooperative, no distress, appears stated age  Head:    Normocephalic, without obvious abnormality, atraumatic  Eyes:    PERRL, conjunctiva/corneas clear, EOM's intact, fundi    benign  Ears:    Normal TM's and external ear canals  Nose:   No drainage or sinus tenderness  Throat:   Lips, mucosa, and tongue normal, OP clear  Neck:   Supple, no lymphadenopathy;  thyroid:  no enlargement/ tenderness/nodules; no carotid bruit or JVD  Back:    Spine nontender, no curvature, ROM normal, no CVA     tenderness  Lungs:     Clear to auscultation bilaterally without wheezes, rales or     ronchi; respirations unlabored  Chest Wall:    No tenderness or deformity   Heart:    Regular rate and rhythm, S1 and S2 normal, no murmur, rub   or gallop  Breast Exam:    Deferred to GYN  Abdomen:     Soft, non-tender, nondistended, normoactive bowel sounds,    no masses, no hepatosplenomegaly  Genitalia:    Deferred to GYN     Extremities:   No clubbing, cyanosis or edema. Focal tenderness at top of L shoulder, and along L trapezius. Some pain with R ear to shoulder stretch. FROM of neck  Pulses:   2+ and symmetric all extremities  Skin:   Skin color, texture, turgor normal, no rashes or lesions  Lymph nodes:   Cervical, supraclavicular, and axillary nodes normal  Neurologic:   CNII-XII intact, normal strength, sensation and gait; reflexes 2+ and symmetric throughout          Psych:   Normal mood, affect, hygiene and grooming.     Lab Results  Component Value Date   HGBA1C 9.8  (A) 11/07/2022    ASSESSMENT/PLAN:  Annual physical exam  Diabetes mellitus type 2 with complications [facility]) Assessment & Plan: Poorly controlled since  not taking rybelsus.  Not controlled with metformin alone.  Suspect compliance with diet (and meds) will be better now that she will be using CGM again.  Awaiting appeal for Rybelsus coverage. Given 3mg  samples; advised to take 3mg  x 2 weeks, then increase to 6mg  (no 7mg  samples available).  Hoping to then get 14mg  approved to continue titration. Reviewed proper diet, how to fit in exercise to her busy life.  Orders: -     POCT glycosylated hemoglobin (Hb A1C) -     metFORMIN HCl; Take 1 tablet (1,000 mg total) by mouth 2 (two) times daily with a meal.  Dispense: 180 tablet; Refill: 1 -     TSH -     FreeStyle Libre 3 Sensor; 1 each by Does not apply route every 14 (fourteen) days.  Dispense: 6 each; Refill: 1  Hyperlipidemia associated with type 2 diabetes mellitus (HCC) Assessment & Plan: Continue statin, lowfat, low cholesterol diet. Lipids at goal on last check. Reminder her risks of statins with pregnancy--reminded condom use, and to schedule with GYN to discuss other options (prev desired BTL)  Orders: -     Atorvastatin Calcium; Take 1 tablet (20 mg total) by mouth daily.  Dispense: 90 tablet; Refill: 1  Hypertension associated with diabetes (HCC) Assessment & Plan: Poorly controlled; had been out of spironolactone, but back on x 2 wks. Encouraged her to monitor BP at home, record values.  Discussed low Na diet, exercise, wt loss.  Discussed risks of poorly controlled HTN (and DM), especially with regard to her kidneys--father on dialysis. Encouraged her to f/u with HTN clinic. Discussed in detail, and she states she will comply. Continue current medications  Orders: -     CMP14+EGFR  Iron deficiency anemia, unspecified iron deficiency anemia type Assessment & Plan: Heavy cycles, past due to see GYN. Recheck today. Briefly  discussed measures to help with cycles, to address with GYN. Discussed taking iron with cycles (further recs to come after lab results back)  Orders: -     CBC with Differential/Platelet -     Ferritin  Essential hypertension Assessment & Plan: Above goal.  Low Na diet, exercise, wt loss. Cont current meds and f/u with HTN clinic/cardiology. To monitor BP at home, and bring BP monitor and list of BP's to cardiology visit  Orders: -     Carvedilol; Take 1 tablet (25 mg total) by mouth 2 (two) times daily.  Dispense: 180 tablet; Refill: 0 -     Spironolactone; Take 1 tablet (25 mg total) by mouth daily.  Dispense: 90 tablet; Refill: 0 -     Valsartan; Take 1 tablet (320 mg total) by mouth daily.  Dispense: 90 tablet; Refill: 0  Diastolic dysfunction -     Spironolactone; Take 1 tablet (25 mg total) by mouth daily.  Dispense: 90 tablet; Refill: 0  Vitamin D deficiency Assessment & Plan: Continue daily supplements  Orders: -     VITAMIN D 25 Hydroxy (Vit-D Deficiency, Fractures)  Asthma, unspecified asthma severity, unspecified whether complicated, unspecified whether persistent Assessment & Plan: Triggered by allergies with grass-cutting recently. Discussed using antihistamines and wearing mask to help limit symptoms. Refilled albuterol.  If frequent/ongoing issues, can again refer to allergist. Seems to be doing okay currently  Orders: -     Albuterol Sulfate HFA; Inhale 1-2 puffs into the lungs every 6 (six) hours as needed for wheezing or shortness of breath.  Dispense: 18 g; Refill: 1  Muscle spasm of shoulder  region -     Methocarbamol; Take 1-2 tablets (500-1,000 mg total) by mouth every 8 (eight) hours as needed for muscle spasms.  Dispense: 20 tablet; Refill: 0     Muscle spasm L trapezius--discussed heat (vs ice), stretches, massage, TENS unit (sister has).  Muscle relaxant prn, risks/SE reviewed. Consider PT if persists.  Issues with getting rybelsus covered.   Apparently PA was denied at some point, I wasn't notified.  Appeal letter sent today (PA done in April/May).  Willing to try injectable GLP if needed, but prefers oral rybelsus, as she tolerated this without problems, and sugars were controlled on this regimen.   Discussed monthly self breast exams and yearly mammograms after the age of 37 (this year, reminded--she can get at GYN vs [facility] or [facility]); at least 30 minutes of aerobic activity at least 5 days/week, weight-bearing exercise at least 2x/week; proper sunscreen use reviewed; healthy diet, including goals of calcium and vitamin D intake and alcohol recommendations (less than or equal to 1 drink/day) reviewed; regular seatbelt use; changing batteries in smoke detectors.  Immunization recommendations discussed, continue yearly flu shots.  COVID booster recommended when new one out in the Fall.  Colonoscopy recommendations reviewed, age 57.

## 2022-11-07 ENCOUNTER — Telehealth: Payer: Self-pay | Admitting: Family Medicine

## 2022-11-07 ENCOUNTER — Ambulatory Visit (INDEPENDENT_AMBULATORY_CARE_PROVIDER_SITE_OTHER): Payer: 59 | Admitting: Family Medicine

## 2022-11-07 ENCOUNTER — Encounter: Payer: Self-pay | Admitting: Family Medicine

## 2022-11-07 ENCOUNTER — Other Ambulatory Visit: Payer: Self-pay | Admitting: Family Medicine

## 2022-11-07 VITALS — BP 140/98 | HR 72 | Ht 64.0 in | Wt 280.4 lb

## 2022-11-07 DIAGNOSIS — J45909 Unspecified asthma, uncomplicated: Secondary | ICD-10-CM | POA: Diagnosis not present

## 2022-11-07 DIAGNOSIS — I10 Essential (primary) hypertension: Secondary | ICD-10-CM

## 2022-11-07 DIAGNOSIS — I152 Hypertension secondary to endocrine disorders: Secondary | ICD-10-CM

## 2022-11-07 DIAGNOSIS — I5189 Other ill-defined heart diseases: Secondary | ICD-10-CM

## 2022-11-07 DIAGNOSIS — E785 Hyperlipidemia, unspecified: Secondary | ICD-10-CM

## 2022-11-07 DIAGNOSIS — M62838 Other muscle spasm: Secondary | ICD-10-CM

## 2022-11-07 DIAGNOSIS — E1159 Type 2 diabetes mellitus with other circulatory complications: Secondary | ICD-10-CM | POA: Diagnosis not present

## 2022-11-07 DIAGNOSIS — E118 Type 2 diabetes mellitus with unspecified complications: Secondary | ICD-10-CM

## 2022-11-07 DIAGNOSIS — D509 Iron deficiency anemia, unspecified: Secondary | ICD-10-CM

## 2022-11-07 DIAGNOSIS — E1169 Type 2 diabetes mellitus with other specified complication: Secondary | ICD-10-CM | POA: Diagnosis not present

## 2022-11-07 DIAGNOSIS — Z0000 Encounter for general adult medical examination without abnormal findings: Secondary | ICD-10-CM

## 2022-11-07 DIAGNOSIS — E559 Vitamin D deficiency, unspecified: Secondary | ICD-10-CM

## 2022-11-07 LAB — POCT GLYCOSYLATED HEMOGLOBIN (HGB A1C): Hemoglobin A1C: 9.8 % — AB (ref 4.0–5.6)

## 2022-11-07 MED ORDER — FREESTYLE LIBRE 3 SENSOR MISC: 1.0000 | 1 refills | Status: DC

## 2022-11-07 MED ORDER — RYBELSUS 14 MG PO TABS: 14.0000 mg | ORAL_TABLET | Freq: Every day | ORAL | 0 refills | Status: DC

## 2022-11-07 MED ORDER — ATORVASTATIN CALCIUM 20 MG PO TABS: 20.0000 mg | ORAL_TABLET | Freq: Every day | ORAL | 1 refills | Status: DC

## 2022-11-07 MED ORDER — METHOCARBAMOL 500 MG PO TABS: 500.0000 mg | ORAL_TABLET | Freq: Three times a day (TID) | ORAL | 0 refills | Status: DC | PRN

## 2022-11-07 MED ORDER — SPIRONOLACTONE 25 MG PO TABS: 25.0000 mg | ORAL_TABLET | Freq: Every day | ORAL | 0 refills | Status: DC

## 2022-11-07 MED ORDER — ALBUTEROL SULFATE HFA 108 (90 BASE) MCG/ACT IN AERS: 1.0000 | INHALATION_SPRAY | Freq: Four times a day (QID) | RESPIRATORY_TRACT | 1 refills | Status: DC | PRN

## 2022-11-07 MED ORDER — METFORMIN HCL 1000 MG PO TABS: 1000.0000 mg | ORAL_TABLET | Freq: Two times a day (BID) | ORAL | 1 refills | Status: DC

## 2022-11-07 MED ORDER — CARVEDILOL 25 MG PO TABS: 25.0000 mg | ORAL_TABLET | Freq: Two times a day (BID) | ORAL | 0 refills | Status: DC

## 2022-11-07 MED ORDER — VALSARTAN 320 MG PO TABS: 320.0000 mg | ORAL_TABLET | Freq: Every day | ORAL | 0 refills | Status: DC

## 2022-11-07 NOTE — Telephone Encounter (Signed)
I believe this goes to you. Dr. Lynelle Doctor said you're doing an appeal. Thanks.

## 2022-11-07 NOTE — Telephone Encounter (Signed)
Appeal sent in

## 2022-11-08 DIAGNOSIS — E1169 Type 2 diabetes mellitus with other specified complication: Secondary | ICD-10-CM | POA: Insufficient documentation

## 2022-11-08 DIAGNOSIS — E118 Type 2 diabetes mellitus with unspecified complications: Secondary | ICD-10-CM | POA: Insufficient documentation

## 2022-11-08 DIAGNOSIS — D509 Iron deficiency anemia, unspecified: Secondary | ICD-10-CM | POA: Insufficient documentation

## 2022-11-08 DIAGNOSIS — I10 Essential (primary) hypertension: Secondary | ICD-10-CM | POA: Insufficient documentation

## 2022-11-08 DIAGNOSIS — I5189 Other ill-defined heart diseases: Secondary | ICD-10-CM | POA: Insufficient documentation

## 2022-11-08 DIAGNOSIS — I152 Hypertension secondary to endocrine disorders: Secondary | ICD-10-CM | POA: Insufficient documentation

## 2022-11-08 LAB — CBC WITH DIFFERENTIAL/PLATELET
Basophils Absolute: 0 10*3/uL (ref 0.0–0.2)
Basos: 0 %
EOS (ABSOLUTE): 0.1 10*3/uL (ref 0.0–0.4)
Eos: 1 %
Hematocrit: 36.3 % (ref 34.0–46.6)
Hemoglobin: 11.9 g/dL (ref 11.1–15.9)
Immature Grans (Abs): 0 10*3/uL (ref 0.0–0.1)
Immature Granulocytes: 0 %
Lymphocytes Absolute: 2 10*3/uL (ref 0.7–3.1)
Lymphs: 31 %
MCH: 25.5 pg — ABNORMAL LOW (ref 26.6–33.0)
MCHC: 32.8 g/dL (ref 31.5–35.7)
MCV: 78 fL — ABNORMAL LOW (ref 79–97)
Monocytes Absolute: 0.5 10*3/uL (ref 0.1–0.9)
Monocytes: 7 %
Neutrophils Absolute: 3.9 10*3/uL (ref 1.4–7.0)
Neutrophils: 61 %
Platelets: 311 10*3/uL (ref 150–450)
RBC: 4.66 x10E6/uL (ref 3.77–5.28)
RDW: 15.1 % (ref 11.7–15.4)
WBC: 6.6 10*3/uL (ref 3.4–10.8)

## 2022-11-08 LAB — FERRITIN: Ferritin: 26 ng/mL (ref 15–150)

## 2022-11-08 LAB — VITAMIN D 25 HYDROXY (VIT D DEFICIENCY, FRACTURES): Vit D, 25-Hydroxy: 28.7 ng/mL — ABNORMAL LOW (ref 30.0–100.0)

## 2022-11-08 LAB — CMP14+EGFR
ALT: 11 IU/L (ref 0–32)
AST: 13 IU/L (ref 0–40)
Albumin/Globulin Ratio: 1.4
Albumin: 3.8 g/dL — ABNORMAL LOW (ref 3.9–4.9)
Alkaline Phosphatase: 76 IU/L (ref 44–121)
BUN/Creatinine Ratio: 16 (ref 9–23)
BUN: 11 mg/dL (ref 6–20)
Bilirubin Total: <0.2 mg/dL (ref 0.0–1.2)
CO2: 18 mmol/L — ABNORMAL LOW (ref 20–29)
Calcium: 9.4 mg/dL (ref 8.7–10.2)
Chloride: 103 mmol/L (ref 96–106)
Creatinine, Ser: 0.68 mg/dL (ref 0.57–1.00)
Globulin, Total: 2.8 g/dL (ref 1.5–4.5)
Glucose: 170 mg/dL — ABNORMAL HIGH (ref 70–99)
Potassium: 4.1 mmol/L (ref 3.5–5.2)
Sodium: 135 mmol/L (ref 134–144)
Total Protein: 6.6 g/dL (ref 6.0–8.5)
eGFR: 114 mL/min/{1.73_m2} (ref >59)

## 2022-11-08 LAB — TSH: TSH: 1.29 u[IU]/mL (ref 0.450–4.500)

## 2022-11-08 NOTE — Assessment & Plan Note (Signed)
Triggered by allergies with grass-cutting recently. Discussed using antihistamines and wearing mask to help limit symptoms. Refilled albuterol.  If frequent/ongoing issues, can again refer to allergist. Seems to be doing okay currently

## 2022-11-08 NOTE — Assessment & Plan Note (Addendum)
Above goal.  Low Na diet, exercise, wt loss. Cont current meds and f/u with HTN clinic/cardiology. To monitor BP at home, and bring BP monitor and list of BP's to cardiology visit

## 2022-11-08 NOTE — Assessment & Plan Note (Signed)
Poorly controlled; had been out of spironolactone, but back on x 2 wks. Encouraged her to monitor BP at home, record values.  Discussed low Na diet, exercise, wt loss.  Discussed risks of poorly controlled HTN (and DM), especially with regard to her kidneys--father on dialysis. Encouraged her to f/u with HTN clinic. Discussed in detail, and she states she will comply. Continue current medications

## 2022-11-08 NOTE — Assessment & Plan Note (Addendum)
Continue statin, lowfat, low cholesterol diet. Lipids at goal on last check. Reminder her risks of statins with pregnancy--reminded condom use, and to schedule with GYN to discuss other options (prev desired BTL)

## 2022-11-08 NOTE — Assessment & Plan Note (Signed)
Continue daily supplements.  

## 2022-11-08 NOTE — Assessment & Plan Note (Signed)
Heavy cycles, past due to see GYN. Recheck today. Briefly discussed measures to help with cycles, to address with GYN. Discussed taking iron with cycles (further recs to come after lab results back)

## 2022-11-08 NOTE — Assessment & Plan Note (Signed)
Poorly controlled since not taking rybelsus.  Not controlled with metformin alone.  Suspect compliance with diet (and meds) will be better now that she will be using CGM again.  Awaiting appeal for Rybelsus coverage. Given 3mg  samples; advised to take 3mg  x 2 weeks, then increase to 6mg  (no 7mg  samples available).  Hoping to then get 14mg  approved to continue titration. Reviewed proper diet, how to fit in exercise to her busy life.

## 2022-11-26 NOTE — Telephone Encounter (Signed)
error 

## 2022-12-29 LAB — HM DIABETES EYE EXAM

## 2023-01-13 ENCOUNTER — Telehealth: Payer: 59 | Admitting: Physician Assistant

## 2023-01-13 DIAGNOSIS — K047 Periapical abscess without sinus: Secondary | ICD-10-CM

## 2023-01-13 MED ORDER — CLINDAMYCIN HCL 300 MG PO CAPS
300.0000 mg | ORAL_CAPSULE | Freq: Three times a day (TID) | ORAL | 0 refills | Status: AC
Start: 2023-01-13 — End: 2023-01-20

## 2023-01-13 MED ORDER — NAPROXEN 500 MG PO TABS: 500.0000 mg | ORAL_TABLET | Freq: Two times a day (BID) | ORAL | 0 refills | Status: DC

## 2023-01-13 NOTE — Progress Notes (Signed)
 E-Visit for Dental Pain  We are sorry that you are not feeling well.  Here is how we plan to help!  Based on what you have shared with me in the questionnaire, it sounds like you have a dental infection.  Clindamycin 300mg  3 times a day for 7 days and Naprosyn 500mg  2 times a day for 7 days for discomfort  It is imperative that you see a dentist within 10 days of this eVisit to determine the cause of the dental pain and be sure it is adequately treated  A toothache or tooth pain is caused when the nerve in the root of a tooth or surrounding a tooth is irritated. Dental (tooth) infection, decay, injury, or loss of a tooth are the most common causes of dental pain. Pain may also occur after an extraction (tooth is pulled out). Pain sometimes originates from other areas and radiates to the jaw, thus appearing to be tooth pain.Bacteria growing inside your mouth can contribute to gum disease and dental decay, both of which can cause pain. A toothache occurs from inflammation of the central portion of the tooth called pulp. The pulp contains nerve endings that are very sensitive to pain. Inflammation to the pulp or pulpitis may be caused by dental cavities, trauma, and infection.    HOME CARE:   For toothaches: Over-the-counter pain medications such as acetaminophen or ibuprofen may be used. Take these as directed on the package while you arrange for a dental appointment. Avoid very cold or hot foods, because they may make the pain worse. You may get relief from biting on a cotton ball soaked in oil of cloves. You can get oil of cloves at most drug stores.  For jaw pain:  Aspirin may be helpful for problems in the joint of the jaw in adults. If pain happens every time you open your mouth widely, the temporomandibular joint (TMJ) may be the source of the pain. Yawning or taking a large bite of food may worsen the pain. An appointment with your doctor or dentist will help you find the cause.      GET HELP RIGHT AWAY IF:  You have a high fever or chills If you have had a recent head or face injury and develop headache, light headedness, nausea, vomiting, or other symptoms that concern you after an injury to your face or mouth, you could have a more serious injury in addition to your dental injury. A facial rash associated with a toothache: This condition may improve with medication. Contact your doctor for them to decide what is appropriate. Any jaw pain occurring with chest pain: Although jaw pain is most commonly caused by dental disease, it is sometimes referred pain from other areas. People with heart disease, especially people who have had stents placed, people with diabetes, or those who have had heart surgery may have jaw pain as a symptom of heart attack or angina. If your jaw or tooth pain is associated with lightheadedness, sweating, or shortness of breath, you should see a doctor as soon as possible. Trouble swallowing or excessive pain or bleeding from gums: If you have a history of a weakened immune system, diabetes, or steroid use, you may be more susceptible to infections. Infections can often be more severe and extensive or caused by unusual organisms. Dental and gum infections in people with these conditions may require more aggressive treatment. An abscess may need draining or IV antibiotics, for example.  MAKE SURE YOU   Understand these  instructions. Will watch your condition. Will get help right away if you are not doing well or get worse.  Thank you for choosing an e-visit.  Your e-visit answers were reviewed by a board certified advanced clinical practitioner to complete your personal care plan. Depending upon the condition, your plan could have included both over the counter or prescription medications.  Please review your pharmacy choice. Make sure the pharmacy is open so you can pick up prescription now. If there is a problem, you may contact your provider  through [employer] and have the prescription routed to another pharmacy.  Your safety is important to [place]. If you have drug allergies check your prescription carefully.   For the next 24 hours you can use MyChart to ask questions about today's visit, request a non-urgent call back, or ask for a work or school excuse. You will get an email in the next two days asking about your experience. I hope that your e-visit has been valuable and will speed your recovery.   I have spent 5 minutes in review of e-visit questionnaire, review and updating patient chart, medical decision making and response to patient.   Margaretann Loveless, PA-C

## 2023-01-22 ENCOUNTER — Other Ambulatory Visit: Payer: Self-pay

## 2023-01-22 ENCOUNTER — Encounter (HOSPITAL_BASED_OUTPATIENT_CLINIC_OR_DEPARTMENT_OTHER): Payer: Self-pay | Admitting: Emergency Medicine

## 2023-01-22 ENCOUNTER — Emergency Department (HOSPITAL_BASED_OUTPATIENT_CLINIC_OR_DEPARTMENT_OTHER): Payer: 59 | Admitting: Radiology

## 2023-01-22 ENCOUNTER — Emergency Department (HOSPITAL_BASED_OUTPATIENT_CLINIC_OR_DEPARTMENT_OTHER)
Admission: EM | Admit: 2023-01-22 | Discharge: 2023-01-22 | Disposition: A | Discharge status: Home / Self Care | Payer: 59 | Attending: Emergency Medicine | Admitting: Emergency Medicine

## 2023-01-22 ENCOUNTER — Other Ambulatory Visit (HOSPITAL_BASED_OUTPATIENT_CLINIC_OR_DEPARTMENT_OTHER): Payer: Self-pay

## 2023-01-22 DIAGNOSIS — Z7984 Long term (current) use of oral hypoglycemic drugs: Secondary | ICD-10-CM | POA: Insufficient documentation

## 2023-01-22 DIAGNOSIS — Z79899 Other long term (current) drug therapy: Secondary | ICD-10-CM | POA: Diagnosis not present

## 2023-01-22 DIAGNOSIS — Z91010 Allergy to peanuts: Secondary | ICD-10-CM | POA: Insufficient documentation

## 2023-01-22 DIAGNOSIS — E119 Type 2 diabetes mellitus without complications: Secondary | ICD-10-CM | POA: Insufficient documentation

## 2023-01-22 DIAGNOSIS — M79672 Pain in left foot: Secondary | ICD-10-CM | POA: Diagnosis not present

## 2023-01-22 DIAGNOSIS — R2689 Other abnormalities of gait and mobility: Secondary | ICD-10-CM | POA: Diagnosis not present

## 2023-01-22 DIAGNOSIS — I10 Essential (primary) hypertension: Secondary | ICD-10-CM | POA: Insufficient documentation

## 2023-01-22 MED ORDER — IBUPROFEN 400 MG PO TABS
600.0000 mg | ORAL_TABLET | Freq: Once | ORAL | Status: AC
  Administered 2023-01-22: 600 mg via ORAL
  Filled 2023-01-22: qty 1

## 2023-01-22 MED ORDER — IBUPROFEN 600 MG PO TABS
600.0000 mg | ORAL_TABLET | Freq: Four times a day (QID) | ORAL | 0 refills | Status: AC | PRN
  Filled 2023-01-22: qty 30, 8d supply, fill #0

## 2023-01-22 NOTE — Discharge Instructions (Addendum)
As discussed, x-ray was negative for any fracture or dislocation.  You do have a rather large bone spur on the bottom of her heel which could be causing your discomfort.  I am also suspicious for potential plantar fasciitis given where you are tender.  Will recommend rehabilitation exercises as described in the information packet attached your discharge papers.  Will also recommend freezing a plastic water bottle and rolling the bottom of your foot on it.  This will help massage her foot as well as provide ice to help with inflammation/pain.  Will also recommend treatment of pain with anti-inflammatories in the outpatient setting.  Recommend follow-up with orthopedics/podiatry for further assessment/evaluation of your symptoms.  Please do not hesitate to return to emergency department if the worrisome signs and symptoms we discussed become apparent.
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
## 2023-01-22 NOTE — ED Notes (Signed)
Portable Xray at bedside.

## 2023-01-22 NOTE — ED Notes (Signed)
Pt discharged to home using teachback Method. Discharge instructions have been discussed with patient and/or family members. Pt verbally acknowledges understanding d/c instructions, has been given opportunity for questions to be answered, and endorses comprehension to checkout at registration before leaving.  

## 2023-01-22 NOTE — ED Triage Notes (Signed)
Pt arrives pov, bib wheelchair c/o LT heel pain, difficult to bear wt.

## 2023-02-04 ENCOUNTER — Other Ambulatory Visit (HOSPITAL_BASED_OUTPATIENT_CLINIC_OR_DEPARTMENT_OTHER): Payer: Self-pay

## 2023-02-06 ENCOUNTER — Telehealth: Payer: Self-pay | Admitting: *Deleted

## 2023-02-06 NOTE — Telephone Encounter (Signed)
Transition Care Management Unsuccessful Follow-up Telephone Call  Date of discharge and from where:  [facility]  01/22/2023  Attempts:  1st Attempt  Reason for unsuccessful TCM follow-up call:  Unable to leave messageVoicemail full

## 2023-02-12 ENCOUNTER — Encounter: Payer: Self-pay | Admitting: Pharmacist

## 2023-02-15 ENCOUNTER — Other Ambulatory Visit: Payer: Self-pay | Admitting: Family Medicine

## 2023-02-15 DIAGNOSIS — J45909 Unspecified asthma, uncomplicated: Secondary | ICD-10-CM

## 2023-02-19 ENCOUNTER — Encounter: Payer: 59 | Admitting: Family Medicine

## 2023-02-19 NOTE — Progress Notes (Unsigned)
No chief complaint on file.  ER 8/28 with L foot pain. Rx'd ibuprofen 600mg , dx PF   Diabetes:  This has been poorly controlled. A1c was 10.2% in 03/2022 when she had run out of Xigduo, wasn't able to get Rybelsus or Bydureon.  In 10/2022, A1c remained elevated at 9.8%. At that time she was taking metformin 1000mg  once daily (supposed to be BID, but caused diarrhea, especially when she had missed doses; tolerated better when taking without misse doses). She had been out of Rybelsus (insurance had changed).  Using [employer] Sugars are running  She denies hypoglycemia, polydipsia, polyuria. Last eye exam was in 11/2020, no retinopathy. Exam is scheduled for 11/2022.  DID SHE GET??*** She checks her feet regularly, denies concerns.     She previously didn't tolerate Trulicity due to nausea, belching.  She prefers to stay on oral Rybelsus.    Diet?    Hypertension:  She currently reports compliance with valsartan 320mg , carvedilol 25mg  BID, and spironolactone 25mg  daily. BP's have been running    Currently she denies headaches, dizziness, chest pain, shortness of breath, edema, muscle cramps.   She previously was seen in HTN clinic by Dr. Duke Salvia (02/2020).  Renal artery [place] was recommended, and checking for hyperaldosteronism was also planned. Pt was noncompliant in f/u. She was referred back in the past, encouraged her to schedule visit there at every appt here, but hasn't.    BP Readings from Last 3 Encounters:  01/22/23 (!) 155/100  11/07/22 (!) 140/98  07/07/22 (!) 160/104     Vitamin D deficiency:  Last level was 28.7 in 10/2022, when taking 4000 IU daily. Previously had been 30.3 in 12/2021, when taking 2000 U daily.  It had been consistently low prior to that. She is currently taking 4000 IU daily.    Hyperlipidemia:  She is taking atorvastatin 20mg  and denies side effects. Still using condoms for contraception, and aware of the potential risks of statins and pregnancy.   Previously reported planning to see OB-GYN to discuss BTL. She hasn't scheduled this appointment (and is long past due for GYN exam).   Last lipids were at goal on last check in 03/2022. Lab Results  Component Value Date   CHOL 161 04/17/2022   HDL 58 04/17/2022   LDLCALC 85 04/17/2022   TRIG 99 04/17/2022   CHOLHDL 2.8 04/17/2022    Anemia: She reports that her menses remain heavy, monthly (sometimes 2x/month). She hadn't seen GYN in 4 years, At her physcial she reported that Dr. Jackelyn Knife had stopped taking her insurance, but takes her current insurance.  SCHEDULED?? WENT??*** She has been taking a MVI with iron, no separate iron.   Lab Results  Component Value Date   WBC 6.6 11/07/2022   HGB 11.9 11/07/2022   HCT 36.3 11/07/2022   MCV 78 (L) 11/07/2022   PLT 311 11/07/2022      Allergies and asthma: She has had many UC/ER visit for wheezing in the past  She was referred to allergist through [facility], but never saw them, as her insurance had changed.  Referral was placed upon returning care here, they tried contacting her 3x, then closed it, never got scheduled. Overall she reports doing well.  Last flare? How often using albuterol?    PMH, PSH, SH reviewed   ROS: no fever, chills, URI symptoms, headaches, dizziness, chest pain, shortness of breath, GI or GU complaints.   Moods are good overall, despite stressors. Dental issues (evisit in August) Heavy  cycles per HPI See HPI  PHYSICAL EXAM:  LMP 01/22/2023   Wt Readings from Last 3 Encounters:  01/22/23 278 lb (126.1 kg)  11/07/22 280 lb 6.4 oz (127.2 kg)  07/06/22 280 lb (127 kg)    Well-appearing, pleasant, obese female in no distress HEENT: conjunctiva and sclera are clear, EOMI.  Neck: no lymphadenopathy, thyromegaly or mass, no bruit Heart: regular rate and rhythm, no murmur Lungs: clear bilaterally, no wheezes Back: no spinal tenderness or CVA tenderness Abdomen: soft, nontender, no mass Extremities: no  edema, normal pulses, Psych: normal mood, affect, hygiene and grooming Neuro: alert and oriented, normal gait   ASSESSMENT/PLAN:  A1c Flu shot, COVID booster Did she get her eye exam in 11/2022 (she said it was scheduled at her June visit). If so, get report  Ensure that she has been taking rybelsus 14mg  and metformin (?twice daily as directed?) Should need RF? (Rx'd #90 in June)  If BP is up, needs to go back to Dr. Thomasene Lot HTN clinic (seen in the past). I have told her this many times, not scheduled. I don't want to put in another referral and get yelled at, if a referral isn't needed.  Please give pt phone number to schedule, and if there is a problem, we can put in another referral.  Did she ever see Dr. Meisinger/GYN??  (Had stated last visit was 4 years ago)

## 2023-02-20 ENCOUNTER — Other Ambulatory Visit: Payer: Self-pay | Admitting: Family Medicine

## 2023-02-20 ENCOUNTER — Encounter: Payer: Self-pay | Admitting: Family Medicine

## 2023-02-20 ENCOUNTER — Ambulatory Visit: Payer: 59 | Admitting: Family Medicine

## 2023-02-20 VITALS — BP 138/96 | HR 64 | Ht 63.5 in | Wt 278.5 lb

## 2023-02-20 DIAGNOSIS — Z23 Encounter for immunization: Secondary | ICD-10-CM

## 2023-02-20 DIAGNOSIS — D509 Iron deficiency anemia, unspecified: Secondary | ICD-10-CM | POA: Diagnosis not present

## 2023-02-20 DIAGNOSIS — G4733 Obstructive sleep apnea (adult) (pediatric): Secondary | ICD-10-CM | POA: Diagnosis not present

## 2023-02-20 DIAGNOSIS — E1169 Type 2 diabetes mellitus with other specified complication: Secondary | ICD-10-CM | POA: Diagnosis not present

## 2023-02-20 DIAGNOSIS — I10 Essential (primary) hypertension: Secondary | ICD-10-CM | POA: Diagnosis not present

## 2023-02-20 DIAGNOSIS — E1159 Type 2 diabetes mellitus with other circulatory complications: Secondary | ICD-10-CM

## 2023-02-20 DIAGNOSIS — I5189 Other ill-defined heart diseases: Secondary | ICD-10-CM | POA: Diagnosis not present

## 2023-02-20 DIAGNOSIS — E785 Hyperlipidemia, unspecified: Secondary | ICD-10-CM | POA: Diagnosis not present

## 2023-02-20 DIAGNOSIS — E118 Type 2 diabetes mellitus with unspecified complications: Secondary | ICD-10-CM | POA: Diagnosis not present

## 2023-02-20 DIAGNOSIS — I152 Hypertension secondary to endocrine disorders: Secondary | ICD-10-CM

## 2023-02-20 DIAGNOSIS — J45909 Unspecified asthma, uncomplicated: Secondary | ICD-10-CM

## 2023-02-20 LAB — POCT GLYCOSYLATED HEMOGLOBIN (HGB A1C): Hemoglobin A1C: 8.4 % — AB (ref 4.0–5.6)

## 2023-02-20 MED ORDER — OZEMPIC (0.25 OR 0.5 MG/DOSE) 2 MG/3ML ~~LOC~~ SOPN
PEN_INJECTOR | SUBCUTANEOUS | 2 refills | Status: DC
Start: 2023-02-20 — End: 2023-02-28

## 2023-02-20 NOTE — Patient Instructions (Addendum)
Continue metformin twice daily. Try taking metamucil daily to see if it helps with the diarrhea. We are adding Ozempic shots once a week. PLEASE let us know if you aren't able to get this filled.  We are referring you to diabetes education/nutritionist.  We are going to refer you back to the hypertension clinic, as your blood pressure isn't adequately controlled on your current regimen.  Untreated sleep apnea can contribute to higher blood pressures. Please contact the DME provider whom she last spoke to in order to get a new machine.  If you need assistance in figuring out who this may have been, talk to [place]. It is important to get you back on treatment for sleep apnea. Continue to work on losing weight.  Be sure to pick up more vitamin D3 at the store. You should be taking 4000-5000 IU daily.  Please schedule your GYN appointment. You are long past due for a regular check-up.  If your allergies aren't adequately controlled with the claritin, add Flonase daily.

## 2023-02-20 NOTE — Assessment & Plan Note (Signed)
BP's above goal. Cont valsartan, carvedilol, spironolactone. Encouraged low sodium diet, exercise, wt loss.  Refer back to HTN clinic due to BP's consistently remaining above goal, for further eval/treatment

## 2023-02-20 NOTE — Telephone Encounter (Signed)
The only notification in the chart is that we sent in an appeal on 6/13. No follow-up on this appeal is in the chart or was sent to me.  She has been off of this med, and poorly controlled diabetes for a long time. She previously tried Trulicity and didn't tolerate it due to nausea and belching. She DID tolerate rybelsus. Was hoping to put her on ozempic, since we know she tolerated semaglutide orally (it just wasn't covered, and apparently couldn't get it authorized). She has poorly controlled diabetes and issues with med compliance, and I would [facility] prefer her on a once weekly injection rather than a daily Victoza. Is there any way you can try and get prior auth for Ozempic?  She is morbidly obese, and has uncontrolled HTN with lots of CV risk factors.  Would benefit to being on a medication with CV benefits.  This person has been failed by delays in getting authorization and notified of denials too many times in the past.  We need to try and get her on something within 2 weeks, need to keep a close eye on this.  If we can't get Ozempic, then she will have to try Victoza. She needs SOMETHING to get her diabetes controlled.  I have tasked the patient to contact us (which she has NOT been good about) if she doesn't hear anything, as I'm usually the last to know that something has been denied, and I (like today) assume that she got the medication and has been taking it.

## 2023-02-20 NOTE — Assessment & Plan Note (Signed)
Improved per last labs. Felt to be related to heavy cycles/GYN losses. Past due for GYN, reminded to schedule. Cont MVI with iron daily.

## 2023-02-20 NOTE — Assessment & Plan Note (Signed)
Cont statin; lipids at goal on last check. Cont use of condoms to avoid pregnancy. Encouraged her to schedule GYN appt (long past due; interested in BTL)

## 2023-02-20 NOTE — Assessment & Plan Note (Signed)
Suboptimally controlled. Cont low Na diet. Refer to HTN clinic for further eval/treatment. Pt willing.  Has previously been seen in that clinic.

## 2023-02-20 NOTE — Assessment & Plan Note (Signed)
Needs new CPAP.  Pt to contact CME provider. To call us/Victoria [name] if having trouble getting in touch with proper company.  +symptomatic.  Understands risks of untreated OSA and desires to restart treatment with CPAP

## 2023-02-20 NOTE — Assessment & Plan Note (Signed)
Poorly controlled, but improved since last visit. Wasn't able to get Rybelsus covered (and we weren't ever notified by pt or got result of appeal from June). Continue metformin BID. Having some diarrhea, encouraged to take metamucil daily. Previously didn't tolerate Trulicity (nausea, belching); since she did tolerate rybelsus, will try for Ozempic weekly--start at 0.25mg  x 4 weeks, then increase to 0.5 mg dose.  Advised pt that if she isn't abe to fill this, she needs to contact us! We did get message today back that Trulicity or Victoza are covered, not Ozempic. Msg sent to Vernona Rieger to see if she could get prior auth given intolerance to Trulicity, tolerates semaglutide, and prefer to avoid daily injections. She is being referred for DM education. Cont CGM with [employer]

## 2023-02-21 LAB — MICROALBUMIN / CREATININE URINE RATIO
Creatinine, Urine: 191.7 mg/dL
Microalb/Creat Ratio: 16 mg/g{creat} (ref 0–29)
Microalbumin, Urine: 31.1 ug/mL

## 2023-02-27 ENCOUNTER — Encounter: Payer: Self-pay | Admitting: Dietician

## 2023-02-27 ENCOUNTER — Ambulatory Visit (HOSPITAL_BASED_OUTPATIENT_CLINIC_OR_DEPARTMENT_OTHER): Payer: 59 | Admitting: Family

## 2023-02-27 ENCOUNTER — Encounter (HOSPITAL_BASED_OUTPATIENT_CLINIC_OR_DEPARTMENT_OTHER): Payer: Self-pay | Admitting: Family

## 2023-02-27 ENCOUNTER — Encounter: Payer: 59 | Attending: Family Medicine | Admitting: Dietician

## 2023-02-27 VITALS — Wt 280.9 lb

## 2023-02-27 DIAGNOSIS — R809 Proteinuria, unspecified: Secondary | ICD-10-CM | POA: Diagnosis not present

## 2023-02-27 DIAGNOSIS — G4733 Obstructive sleep apnea (adult) (pediatric): Secondary | ICD-10-CM

## 2023-02-27 DIAGNOSIS — E1129 Type 2 diabetes mellitus with other diabetic kidney complication: Secondary | ICD-10-CM | POA: Diagnosis not present

## 2023-02-27 DIAGNOSIS — I1A0 Resistant hypertension: Secondary | ICD-10-CM | POA: Diagnosis not present

## 2023-02-27 DIAGNOSIS — Z006 Encounter for examination for normal comparison and control in clinical research program: Secondary | ICD-10-CM

## 2023-02-27 MED ORDER — SPIRONOLACTONE 50 MG PO TABS: 50.0000 mg | ORAL_TABLET | Freq: Every day | ORAL | 1 refills | Status: DC

## 2023-02-27 NOTE — Telephone Encounter (Signed)
PA request submitted through CoverMyMeds for Ozempic. Pending response, covermymeds key [id number].

## 2023-02-27 NOTE — Telephone Encounter (Signed)
Victoria [name] this is the pt I spoke with you about if you can please try to get to today because I am on check in. Thanks

## 2023-02-27 NOTE — Patient Instructions (Addendum)
1-Try the plate planner 1/2 plate of non starchy vegetables, 1/4 protein, 1/4 carbohydrate  2-Increase water intake: st step aim for 2 bottles of water daily  3-Try taking metformin as prescribed with food (full stomach) in an effort to improve GI symptoms

## 2023-02-27 NOTE — Patient Instructions (Addendum)
Medication Instructions:  Your physician has recommended you make the following change in your medication:   INCREASE Spironolactone to 50mg  daily   Labwork: Your physician recommends that you return for lab work in a week: cortisol, BMP   Testing/Procedures: Your physician has requested that you have a renal artery duplex. During this test, an ultrasound is used to evaluate blood flow to the kidneys. Allow one hour for this exam. Do not eat after midnight the day before and avoid carbonated beverages. Take your medications as you usually do.    Follow-Up: Follow up as scheduled    Special Instructions:  We have referred you to [facility] Exercise Program.
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
## 2023-02-27 NOTE — Research (Signed)
  Subject Name: Victoria [name] met inclusion and exclusion criteria for the Virtual Care and Social Determinant Interventions for the management of hypertension trial.  The informed consent form, study requirements and expectations were reviewed with the subject by Dr. Duke Salvia and myself. The subject was given the opportunity to read the consent and ask questions. The subject verbalized understanding of the trial requirements.  All questions were addressed prior to the signing of the consent form. The subject agreed to participate in the trial and signed the informed consent. The informed consent was obtained prior to performance of any protocol-specific procedures for the subject.  A copy of the signed informed consent was given to the subject and a copy was placed in the subject's medical record.  Victoria [name] was randomized to Group 1.

## 2023-02-27 NOTE — Addendum Note (Signed)
Addended by: Alver Sorrow on: 02/27/2023 05:02 PM   Modules accepted: Orders

## 2023-02-27 NOTE — Telephone Encounter (Signed)
Failed to note that This was denied & Appeal was done & faxed in & no response was ever received

## 2023-02-28 ENCOUNTER — Encounter (HOSPITAL_BASED_OUTPATIENT_CLINIC_OR_DEPARTMENT_OTHER): Payer: Self-pay

## 2023-02-28 ENCOUNTER — Telehealth: Payer: Self-pay

## 2023-02-28 NOTE — Telephone Encounter (Signed)
Called in rx verbally to [facility] [phone] at 3:57pm 02/28/23 as e-submission failed.

## 2023-02-28 NOTE — Telephone Encounter (Signed)
Attempted to contact patient via phone to provide medication update and offer med counseling, had to leave a voicemail. Will continue to attempt to reach.

## 2023-02-28 NOTE — Telephone Encounter (Signed)
I tried sending Victoza in. Looks like transmission to pharmacy failed.  Forwarding to pool for them to try and re-send or call pharmacy. Also, I need someone to call the patient and give her an update--that she must try the daily injection of Victoza, as the ozempic wasn't covered. Please advise her on how to titrate the dose (written in rx), see if she needs assistance/education (was a long time ago that she used Trulicity). And let her know that she needs to contact us if she cannot tolerate this medication due to side effects.  She has f/u appt in 3 months, but needs to either touch base with [place] with her blood sugars in 4 and 8 weeks (can be through MyChart), or f/u in the office if having issues/concerns.  Putting in REF 2016 for Angela to schedule f/u.

## 2023-02-28 NOTE — Telephone Encounter (Signed)
Called pt and explained program to pt and offered MW 1p-215p starting 10/21 at [place].  Unable to do that time of day due to managing school pickup.  Offered MW 1030a-unable to do due to in school. Needs earlier class. Will ask other location for time.    S/W coach at [facility], no earlier class available.   VMT to pt. Will put her on the evening class schedule and will let her know when there is a class starting.

## 2023-02-28 NOTE — Addendum Note (Signed)
Addended by: Marlene Lard on: 02/28/2023 08:28 AM   Modules accepted: Orders

## 2023-02-28 NOTE — Telephone Encounter (Signed)
PA denied because patient has not tried and failed/not tolerated both trulicity and victoza. Per denial letter, patient would need to see if she can tolerate Victoza before insurance will consider approving ozempic.   Can an rx for Victoza be sent in? Also, if a REF 2016 for pharmacy is placed, I can get patient on my schedule and closely follow med tolerance/adherence.  [name], PharmD Clinical Pharmacist [phone]

## 2023-03-03 ENCOUNTER — Telehealth: Payer: Self-pay

## 2023-03-03 DIAGNOSIS — G4733 Obstructive sleep apnea (adult) (pediatric): Secondary | ICD-10-CM

## 2023-03-03 NOTE — Telephone Encounter (Signed)
**Note De-Identified Cherae Marton Obfuscation** Split Night Sleep Study PA started through [facility] Provider Portal Authorization Number: NA Case Number: [id number]     Patient Name: Victoria [name], Victoria [name] DOB: 24-Feb-1981 Status: Additional Information Received; Pending eviCore Review P2P Status:  Approval Date:  Service Code: 02725 Service Description: POLYSOM >6 YRS >=4 ADD W/ PAP Site Name: [facility]  Start Date: 03/03/2023 Expiration Date:  Date Last Updated: 03/03/2023 1:07:01 PM

## 2023-03-03 NOTE — Progress Notes (Signed)
Care Guide Note  03/03/2023 Name: Victoria [name] MRN: [id number] DOB: 01-15-1981  Referred by: [name], MD Reason for referral : Care Coordination (Outreach to schedule with Pharm d )   Victoria [name] is a 42 y.o. year old female who is a primary care patient of [name], MD. Victoria [name] was referred to the pharmacist for assistance related to DM.    Successful contact was made with the patient to discuss pharmacy services including being ready for the pharmacist to call at least 5 minutes before the scheduled appointment time, to have medication bottles and any blood sugar or blood pressure readings ready for review. The patient agreed to meet with the pharmacist via with the pharmacist via telephone visit on (date/time).  03/04/2023  Penne Lash, RMA Care Guide [facility]  [place], [place] [address] Direct Dial: [phone] [email or web address]

## 2023-03-04 ENCOUNTER — Other Ambulatory Visit: Payer: 59

## 2023-03-04 ENCOUNTER — Telehealth: Payer: Self-pay

## 2023-03-04 NOTE — Progress Notes (Signed)
03/04/2023  Patient ID: Victoria [name], female   DOB: Oct 03, 1980, 42 y.o.   MRN: [id number]  Insurance company is now requiring a prior authorization for [employer]. Submitted today 03/04/23 and CoverMyMeds key is [id number], pending response, should be decided by tomorrow afternoon, will closely follow.  Contacted patient's insurance company to discuss covered alternatives and was made aware that patient has a $4,495 deductible to be met on prescription medications so cost may be an issue if Victoza approved.  Did inquire about Januvia as patient had been on in the past and was told due to deductible, copay would be $125 (there is a copay card that would cover this charge), discussed with patient and willing to go this route if Victoza is not affordable.  Routing to PCP for awareness. Following up with patient/pharmacy tmrw for price/coverage once prior auth determined  [name], PharmD Clinical Pharmacist [phone]

## 2023-03-04 NOTE — Telephone Encounter (Signed)
Update: Victoza has been approved by patient insurance for a monthly copay of $40. Contacted patient to make her aware and confirm copay would be affordable. Had to leave a voicemail.

## 2023-03-04 NOTE — Progress Notes (Signed)
03/04/2023 Name: Victoria [name] MRN: [id number] DOB: [birth year]  Chief Complaint  Patient presents with   Diabetes   Hyperlipidemia   Medication Management   Hypertension    Victoria [name] is a 42 y.o. year old female who presented for a telephone visit.   They were referred to the pharmacist by their PCP for assistance in managing complex medication management.    Subjective:  Care Team: Primary Care Provider: [name], MD ; Next Scheduled Visit: 05/29/23  Medication Access/Adherence  Current Pharmacy:  [id number] - [place] - [address] AT [facility] [address] [place] [address] Phone: [phone] Fax: [phone]  [id number] - [place] - [address] [place] ([place]) [place] [address] Phone: [phone] Fax: [phone]  [facility] [address] [place] [address] Phone: [phone] Fax: [phone]   Patient reports affordability concerns with their medications: Yes  - victoza requiring a PA, had not contacted office to notify us Patient reports access/transportation concerns to their pharmacy: No  Patient reports adherence concerns with their medications:  Yes  - cost issues, has not notified us about med issues, ends up going without medications for month due to this   Diabetes:  Current medications: Metformin 1000mg  BID, Victoza 0.6mg  sq daily (not picked up as of this appt due to requiring a prior auth) Medications tried in the past: Rybelsus (cost-not covered by ins), Farxiga/Jardiance (yeast issues), Bydureon (not covered), Januvia (stopped due to cost concern in the past)  -Pt did not notify office medications were not covered/affordable and stopped on her own, has only been taking metformin since at least June  No low sugars, lowest it has been is 94     Patient denies  hypoglycemic s/sx including dizziness, shakiness, sweating. Patient denies hyperglycemic symptoms including polyuria, polydipsia, polyphagia, nocturia, neuropathy, blurred vision.  Current meal patterns:  Has been referred to a nutritionist and is working on incorporating their recommendations of limited fried/fatty foods and being mindful of low-carb meal options  Current physical activity: has a gym membership but has not been going  Current medication access support: None  Hypertension:  Current medications: Valsartan 320mg  daily, Spironolactone 50mg  daily, Carvedilol 25mg  BID Medications previously tried: Amlodipine, Chlorthalidone, hydrochlorothiazide, Labetalol, Losartan, Methyldopa, Nifedipine  Patient has a validated, automated, upper arm home BP cuff In a study, where she has to check BP twice daily, two hours after medicine through her cardiology office Current blood pressure readings readings: 151/101 yesterday evening - Spironolactone just increased to 50mg  by cardio  Patient denies hypotensive s/sx including dizziness, lightheadedness.  Patient denies hypertensive symptoms including headache, chest pain, shortness of breath  Current meal patterns: Trying to be mindful of sodium  Current physical activity: see above   Hyperlipidemia/ASCVD Risk Reduction  Current lipid lowering medications: Atorvastatin 20mg  1 qd Medications tried in the past:   ASCVD History: None Family History: Both parents CVA Risk Factors: Diabetes  Current physical activity: see above  Current medication access support: None   PREVENT Risk Score:  10 year risk of CVD: BMI out of range for calculation tool 10 year risk of ASCVD: BMI out of range for calculation tool   Heart Failure:  Current medications:  ACEi/ARB/ARNI: Valsartan 320mg  1 qd SGLT2i: none (yeast issues) Beta blocker: Carvedilol 25mg  BID Mineralocorticoid Receptor Antagonist: Spironolactone 50mg  qd Diuretic regimen:  None  Current home blood pressure readings:  see above Current home weights: 278-280 lbs  Patient denies volume overload signs or symptoms including shortness of breath, lower extremity edema, increased use of pillows at night  Current medication access support: None   Objective:  Lab Results  Component Value Date   HGBA1C 8.4 (A) 02/20/2023    Lab Results  Component Value Date   CREATININE 0.68 11/07/2022   BUN 11 11/07/2022   NA 135 11/07/2022   K 4.1 11/07/2022   CL 103 11/07/2022   CO2 18 (L) 11/07/2022    Lab Results  Component Value Date   CHOL 161 04/17/2022   HDL 58 04/17/2022   LDLCALC 85 04/17/2022   TRIG 99 04/17/2022   CHOLHDL 2.8 04/17/2022    Medications Reviewed Today   Medications were not reviewed in this encounter       Assessment/Plan:   Diabetes: - Currently uncontrolled - Reviewed long term cardiovascular and renal outcomes of uncontrolled blood sugar - Reviewed goal A1c, goal fasting, and goal 2 hour post prandial glucose - Reviewed dietary modifications including low-carb diet - Reviewed lifestyle modifications including: achieving a total of of physical activity/week - Recommend to notify the office/myself IMMEDIATELY upon any delays or issues obtaining/taking medications  - Recommend to check glucose at least twice daily (fasting, post-prandial) with fasting sugars -Connected via [employer] so have access to patient's sugars at all times .  Hypertension: - Currently uncontrolled - Reviewed long term cardiovascular and renal outcomes of uncontrolled blood pressure - Reviewed appropriate blood pressure monitoring technique and reviewed goal blood pressure. Recommended to check home blood pressure and heart rate twice daily - Recommend to limit salt in diet! Try to avoid fast food or make low salt changes (salad vs fries) -Continue medications therapy and BP monitoring routine prescribed by cardiology  study     Hyperlipidemia/ASCVD Risk Reduction: - Currently uncontrolled.  -Goal LDL <70 - Reviewed long term complications of uncontrolled cholesterol - Reviewed dietary recommendations including limiting fried, fatty foods - Reviewed lifestyle recommendations including increased physical activity - Recommend to continue current medication therapy. If not at goal at next check in Jan, consider increase of lipitor or add on of zetia     Heart Failure: - Currently appropriately managed - Reviewed appropriate blood pressure monitoring technique and reviewed goal blood pressure - Reviewed to weigh daily and when to contact cardiology with weight gain - Reviewed dietary modifications including limiting salt intake - Recommend to check weight on a weekly basis (does not want to check daily) and watch for >5lbs in 1 week gained or changes in fluid status (difficulty breathing, swelling)  -Could consider Entresto if BP continues to remain uncontrolled   Follow Up Plan: 2 weeks   [name], PharmD Clinical Pharmacist [phone]

## 2023-03-04 NOTE — Telephone Encounter (Signed)
Patient confirmed copay of $40 will be okay for the Victoza and she will pick up today. Scheduled follow up for 10/22 to see how patient tolerates, but instructed patient to call sooner if any issues using the medication.  [name], PharmD Clinical Pharmacist [phone]

## 2023-03-05 ENCOUNTER — Other Ambulatory Visit: Payer: Self-pay

## 2023-03-05 MED ORDER — INSULIN PEN NEEDLE 32G X 4 MM MISC: 1.0000 | Freq: Every day | 1 refills | Status: AC

## 2023-03-06 ENCOUNTER — Other Ambulatory Visit: Payer: Self-pay | Admitting: Family Medicine

## 2023-03-06 DIAGNOSIS — I10 Essential (primary) hypertension: Secondary | ICD-10-CM

## 2023-03-06 NOTE — Telephone Encounter (Signed)
I noticed you referred her to HTN clinic and she has appt 11/14. Do you want to fill for 30 days vs 90?

## 2023-03-10 ENCOUNTER — Other Ambulatory Visit: Payer: Self-pay | Admitting: Family Medicine

## 2023-03-10 DIAGNOSIS — I10 Essential (primary) hypertension: Secondary | ICD-10-CM

## 2023-03-10 DIAGNOSIS — I5189 Other ill-defined heart diseases: Secondary | ICD-10-CM

## 2023-03-13 NOTE — Telephone Encounter (Addendum)
**Note De-Identified Aryam Zhan Obfuscation** Per letter from [employer]: Split Night Sleep Study is Denied:  Reason given, similar test has already been performed. no additional testing is necessary at this time. [id number]. can do an expedited appeal toll free at [phone].   I called [employer]'s expedited appeal line but was unable to s/w anyone and could only leave a VM message requesting that they call Larita Fife back on my direct line concerning this denial.

## 2023-03-17 NOTE — Telephone Encounter (Signed)
**Note De-Identified Makeda Peeks Obfuscation** I called [employer]'s expedited appeal line but was again unable to s/w anyone and could only leave a VM message requesting that they call Larita Fife back on my direct line concerning this denial.

## 2023-03-18 ENCOUNTER — Other Ambulatory Visit: Payer: 59

## 2023-03-18 NOTE — Progress Notes (Signed)
03/18/2023 Name: Victoria [name] MRN: [id number] DOB: [birth year]  Chief Complaint  Patient presents with   Hypertension   Diabetes    Victoria [name] is a 42 y.o. year old female who presented for a telephone visit.   They were referred to the pharmacist by their PCP for assistance in managing complex medication management.    Subjective:  Care Team: Primary Care Provider: [name], MD ; Next Scheduled Visit: 05/29/23  Medication Access/Adherence  Current Pharmacy:  [id number] - [place], [place] - [address] AT [facility] [address] [place] [address] Phone: [phone] Fax: [phone]  [id number] - [place] ([place]), [place] - [address]  ([place] [address] Phone: [phone] Fax: [phone]  [facility] [address] [place] [address] Phone: [phone] Fax: [phone]   Patient reports affordability concerns with their medications: No Patient reports access/transportation concerns to their pharmacy: No  Patient reports adherence concerns with their medications:  No   Diabetes:  Current medications: Metformin 1000mg  BID, Victoza 0.6mg  sq daily  Medications tried in the past: Rybelsus (cost-not covered by ins), Farxiga/Jardiance (yeast issues), Bydureon (not covered), Januvia (stopped due to cost concern in the past)        Patient denies hypoglycemic s/sx including dizziness, shakiness, sweating. Patient denies hyperglycemic symptoms including polyuria, polydipsia, polyphagia, nocturia, neuropathy, blurred vision.  Current meal patterns:  Has been referred to a nutritionist and is working on incorporating their recommendations of limited fried/fatty foods and being mindful of low-carb meal options  Current physical activity: has a gym membership but has not been going as often as she  would like  Current medication access support: None  Hypertension:  Current medications: Valsartan 320mg  daily, Spironolactone 50mg  daily, Carvedilol 25mg  BID Medications previously tried: Amlodipine, Chlorthalidone, hydrochlorothiazide, Labetalol, Losartan, Methyldopa, Nifedipine  Patient has a validated, automated, upper arm home BP cuff In a study, where she has to check BP twice daily, two hours after medicine through her cardiology office Current blood pressure readings readings: seeing readings in the 130s/80s  Patient denies hypotensive s/sx including dizziness, lightheadedness.  Patient denies hypertensive symptoms including headache, chest pain, shortness of breath  Current meal patterns: Trying to be mindful of sodium  Current physical activity: see above    Objective:  Lab Results  Component Value Date   HGBA1C 8.4 (A) 02/20/2023    Lab Results  Component Value Date   CREATININE 0.68 11/07/2022   BUN 11 11/07/2022   NA 135 11/07/2022   K 4.1 11/07/2022   CL 103 11/07/2022   CO2 18 (L) 11/07/2022    Lab Results  Component Value Date   CHOL 161 04/17/2022   HDL 58 04/17/2022   LDLCALC 85 04/17/2022   TRIG 99 04/17/2022   CHOLHDL 2.8 04/17/2022    Medications Reviewed Today     Reviewed by [name], RPH (Pharmacist) on 03/18/23 at [id number]  Med List Status: <None>   Medication Order Taking? Sig Documenting Provider Last Dose Status Informant  acetaminophen (TYLENOL) 500 MG tablet [id number] Yes Take 1,000 mg by mouth as needed for moderate pain. [provider] Taking Active            Med Note Sharman Crate Feb 20, 2023  2:24 PM) Took 2 last night  albuterol (VENTOLIN HFA) 108 (90 Base) MCG/ACT inhaler [id number] Yes Inhale 1-2 puffs into  the lungs every 6 (six) hours as needed for wheezing or shortness of breath. [name], MD Taking Active            Med Note [name], [name] Feb 20, 2023  2:24 PM) As needed   atorvastatin (LIPITOR) 20 MG tablet [id number] Yes Take 1 tablet (20 mg total) by mouth daily. [name], MD Taking Active   carvedilol (COREG) 25 MG tablet [id number] Yes TAKE 1 TABLET BY MOUTH TWICE A [name], MD Taking Active   Cholecalciferol (VITAMIN D) 50 MCG (2000 UT) CAPS [id number] Yes Take 2 capsules by mouth daily. [provider] Taking Active Self           Med Note Jola Babinski Oct 23, 2020  4:34 AM)    Continuous Glucose Sensor (FREESTYLE LIBRE 3 SENSOR) [place] [id number] Yes 1 each by Does not apply route every 14 (fourteen) days. [name], MD Taking Active   ibuprofen (ADVIL) 600 MG tablet [id number] Yes Take 1 tablet (600 mg total) by mouth every 6 (six) hours as needed. [name], PA Taking Active            Med Note [name], [name] Feb 20, 2023  2:25 PM) As needed   Insulin Pen Needle 32G X 4 MM MISC [id number] Yes 1 Needle by Does not apply route daily. [name], NP Taking Active   liraglutide (VICTOZA) 18 MG/3ML SOPN [id number] Yes Inject 0.6 mg subcutaneously daily for 1 week, then 1.2 mg [place] daily.  Can further increase to 1.8 mg [place] daily if needed based on blood sugars [name], MD Taking Active   loratadine (CLARITIN) 10 MG tablet [id number] Yes Take 10 mg by mouth daily. [provider] Taking Active            Med Note [name], [name] Feb 20, 2023  2:25 PM) As needed  metFORMIN (GLUCOPHAGE) 1000 MG tablet [id number] Yes Take 1 tablet (1,000 mg total) by mouth 2 (two) times daily with a meal. [name], MD Taking Active   methocarbamol (ROBAXIN) 500 MG tablet [id number] Yes Take 1-2 tablets (500-1,000 mg total) by mouth every 8 (eight) hours as needed for muscle spasms. [name], MD Taking Active            Med Note [name], [name] Feb 20, 2023  2:26 PM) As needed, rarely  Multiple Vitamins-Minerals (ONE A DAY WOMEN 50 PLUS PO) [id number] Yes Take 1 tablet by mouth daily. [provider] Taking  Active            Med Note [name], Roma Schanz   Wed Apr 17, 2022 11:00 AM) With iron  oxymetazoline (AFRIN) 0.05 % nasal spray [id number] Yes Place 1 spray into both nostrils 2 (two) times daily. [provider] Taking Active            Med Note Sharman Crate Feb 20, 2023  2:25 PM) As needed  spironolactone (ALDACTONE) 50 MG tablet [id number] Yes Take 1 tablet (50 mg total) by mouth daily. Alver Sorrow, NP Taking Active   valsartan (DIOVAN) 320 MG tablet [id number] Yes TAKE 1 TABLET BY MOUTH EVERY DAY [name], MD Taking Active               Assessment/Plan:   Diabetes: - Currently uncontrolled - Reviewed long term cardiovascular and renal outcomes  of uncontrolled blood sugar - Reviewed goal A1c, goal fasting, and goal 2 hour post prandial glucose - Reviewed dietary modifications including low-carb diet - Reviewed lifestyle modifications including: achieving a total of of physical activity/week - Recommend to notify the office/myself IMMEDIATELY upon any delays or issues obtaining/taking medications  - Recommend to check glucose at least twice daily (fasting, post-prandial) with fasting sugars -INCREASE Victoza to 1.2mg  starting today, tolerating 0.6mg  dose very well and sugars are very response .  Hypertension: - Currently controlled - Reviewed long term cardiovascular and renal outcomes of uncontrolled blood pressure - Reviewed appropriate blood pressure monitoring technique and reviewed goal blood pressure. Recommended to check home blood pressure and heart rate twice daily - Recommend to limit salt in diet! Try to avoid fast food or make low salt changes (salad vs fries) -Continue medications therapy and BP monitoring routine prescribed by cardiology study   Follow Up Plan:1 month   [name], PharmD Clinical Pharmacist [phone]

## 2023-03-18 NOTE — Telephone Encounter (Signed)
**Note De-Identified [facility] Obfuscation** I have not received a call back from [employer]'s expedited appeal line after several attempts so I have attempted another Split Night Sleep Study PA through the [facility] portal.  Authorization Number: NA Case Number: [id number]     Patient Name: Victoria [name], Victoria [name] DOB: Mar 02, 1981 Status: Additional Information Received; Pending [employer]iCore Review P2P Status:  Approval Date:  Service Code: 52841 Service Description: POLYSOM >6 YRS >=4 ADD W/ PAP Site Name: Caryl Pina  Start Date: 03/18/2023

## 2023-03-19 ENCOUNTER — Encounter (HOSPITAL_BASED_OUTPATIENT_CLINIC_OR_DEPARTMENT_OTHER): Payer: 59

## 2023-03-19 ENCOUNTER — Other Ambulatory Visit (HOSPITAL_COMMUNITY): Payer: Self-pay | Admitting: Cardiology

## 2023-03-19 DIAGNOSIS — I779 Disorder of arteries and arterioles, unspecified: Secondary | ICD-10-CM

## 2023-03-27 ENCOUNTER — Telehealth: Payer: 59 | Admitting: Physician Assistant

## 2023-03-27 DIAGNOSIS — J4521 Mild intermittent asthma with (acute) exacerbation: Secondary | ICD-10-CM

## 2023-03-27 MED ORDER — PREDNISONE 20 MG PO TABS
40.0000 mg | ORAL_TABLET | Freq: Every day | ORAL | 0 refills | Status: DC
Start: 2023-03-27 — End: 2023-05-13

## 2023-03-27 MED ORDER — ALBUTEROL SULFATE HFA 108 (90 BASE) MCG/ACT IN AERS
2.0000 | INHALATION_SPRAY | Freq: Four times a day (QID) | RESPIRATORY_TRACT | 0 refills | Status: DC | PRN
Start: 2023-03-27 — End: 2023-07-02

## 2023-03-27 MED ORDER — ALBUTEROL SULFATE (2.5 MG/3ML) 0.083% IN NEBU
2.5000 mg | INHALATION_SOLUTION | Freq: Four times a day (QID) | RESPIRATORY_TRACT | 1 refills | Status: DC | PRN
Start: 2023-03-27 — End: 2023-07-02

## 2023-03-27 NOTE — Progress Notes (Signed)
E-Visit for Asthma  Based on what you have shared with me, it looks like you may have a flare up of your asthma.  Asthma is a chronic (ongoing) lung disease which results in airway obstruction, inflammation and hyper-responsiveness.   Asthma symptoms vary from person to person, with common symptoms including nighttime awakening and decreased ability to participate in normal activities as a result of shortness of breath. It is often triggered by changes in weather, changes in the season, changes in air temperature, or inside (home, school, daycare or work) allergens such as animal dander, mold, mildew, woodstoves or cockroaches.   It can also be triggered by hormonal changes, extreme emotion, physical exertion or an upper respiratory tract illness.     It is important to identify the trigger, and then eliminate or avoid the trigger if possible.   If you have been prescribed medications to be taken on a regular basis, it is important to follow the asthma action plan and to follow guidelines to adjust medication in response to increasing symptoms of decreased peak expiratory flow rate  Treatment: I have prescribed: refills of you Albuterol (Proventil HFA; Ventolin HFA) 108 (90 Base) MCG/ACT Inhaler 2 puffs into the lungs every six hours as needed for wheezing or shortness of breath, and you Albuterol (Proventil) (2.5 mg in 3 mL) 0.083 % Take by nebulization solution every six hours as needed for wheezing or shortness of breath.  I have also sent in Prednisone 40mg  by mouth per day for 5 - 7 days.  HOME CARE Only take medications as instructed by your medical team. Consider wearing a mask or scarf to improve breathing air temperature have been shown to decrease irritation and decrease exacerbations Get rest. Taking a steamy shower or using a humidifier may help nasal congestion sand  ease sore throat pain. You can place a towel over your head and breathe in the steam from hot water coming from a faucet. Using a saline nasal spray works much the same way.  Cough drops, hare candies and sore throat lozenges may ease your cough.  Avoid close contacts especially the very you and the elderly Cover your mouth if you cough or sneeze Always remember to wash your hands.    GET HELP RIGHT AWAY IF: You develop worsening symptoms; breathlessness at rest, drowsy, confused or agitated, unable to speak in full sentences You have coughing fits You develop a severe headache or visual changes You develop shortness of breath, difficulty breathing or start having chest pain Your symptoms persist after you have completed your treatment plan If your symptoms do not improve within 10 days  MAKE SURE YOU Understand these instructions. Will watch your condition. Will get help right away if you are not doing well or get worse.   Your e-visit answers were reviewed by a board certified advanced clinical practitioner to complete your personal care plan, Depending upon the condition, your plan could have included both over the counter or prescription medications.   Please review your pharmacy choice. Your safety is important to [place]. If you have drug allergies check your prescription carefully.  You can use MyChart to ask questions about today's visit, request a non-urgent  call back, or ask for a work or school excuse for 24 hours related to this e-Visit. If it has been greater than 24 hours you will need to follow up with your provider, or enter a new e-Visit to address those concerns.   You will get an e-mail in  the next two days asking about your experience. I hope that your e-visit has been valuable and will speed your recovery. Thank you for using e-visits.

## 2023-03-27 NOTE — Progress Notes (Signed)
I have spent 5 minutes in review of e-visit questionnaire, review and updating patient chart, medical decision making and response to patient.   Mia Milan Cody Jacklynn Dehaas, PA-C    

## 2023-04-01 ENCOUNTER — Encounter (HOSPITAL_BASED_OUTPATIENT_CLINIC_OR_DEPARTMENT_OTHER): Payer: Self-pay | Admitting: Family

## 2023-04-01 NOTE — Telephone Encounter (Signed)
Papers to be faxed 11/6.   Called to reschedule, no answer, left detailed message on VM. Will attempt call again 11/7.

## 2023-04-01 NOTE — Telephone Encounter (Signed)
Case [id number]  Received fax from [facility] that appeal review has been scheduled for 04/09/23 at 10 AM. I am out of  office that day and will not be able to complete.   Can send additional info to [place], Attention- [employer] via fax at [phone].   Will need to call [employer] at [place] at [phone] to have it rescheduled.   Alver Sorrow, NP

## 2023-04-02 NOTE — Telephone Encounter (Signed)
Victorino Dike with [employer] calling back. She says  she received a call from [place] yesterday that [place] was unable to participate in the panel on 11/13, but today she received a 12 page fax. Phone:  [phone]

## 2023-04-03 NOTE — Telephone Encounter (Addendum)
-----   Message from Alver Sorrow, NP sent at 04/01/2023  4:48 PM EST -----  Can we call to reschedule this appeal, please? Would be best at 9:30 AM, 1PM, or 4PM. Also faxing more info.   Spoke with Victorino Dike and rescheduled for 11/14 at 1:00 pm  Luther Parody will need to call in about 5 minutes prior  [phone]  Conference ID# [id number]#

## 2023-04-03 NOTE — Telephone Encounter (Signed)
We have asked them to move the appeal date. We have additionally faxed information for them to review prior to appeal call per the request we received. If they review the information and it is sufficient to change their determination, that is great.   Alver Sorrow, NP

## 2023-04-10 ENCOUNTER — Encounter (HOSPITAL_BASED_OUTPATIENT_CLINIC_OR_DEPARTMENT_OTHER): Payer: 59 | Admitting: Family

## 2023-04-10 NOTE — Telephone Encounter (Signed)
Call completed with nurse consultant and 3 MD per insurance company. Provided info regarding need for repeat sleep testing.   Per their info will be mailed their decision after deliberation (which I am not present for).   Hopeful sleep study will be approved.   If not, will discuss initiation of autoPAP with Dr. Mayford Knife.   Alver Sorrow, NP

## 2023-04-11 ENCOUNTER — Telehealth: Payer: Self-pay

## 2023-04-11 NOTE — Telephone Encounter (Signed)
Call to pt reference next PREP class in the evening.  Can do T/TH 530p-645p on T/TH starting on 04/29/23 Will call her closer to the start of class to schedule intake

## 2023-04-15 ENCOUNTER — Other Ambulatory Visit (INDEPENDENT_AMBULATORY_CARE_PROVIDER_SITE_OTHER): Payer: 59

## 2023-04-15 VITALS — BP 138/89

## 2023-04-15 DIAGNOSIS — I10 Essential (primary) hypertension: Secondary | ICD-10-CM

## 2023-04-15 DIAGNOSIS — E118 Type 2 diabetes mellitus with unspecified complications: Secondary | ICD-10-CM

## 2023-04-15 NOTE — Progress Notes (Signed)
Call to pt to schedule intake with patient for PREP Scheduled for 11/26 at 530p at [facility].  Pt will need to check school schedule to be sure there isn't a conflict.  Has my number for call back if need be

## 2023-04-15 NOTE — Progress Notes (Addendum)
04/15/2023 Name: Victoria [name] MRN: [id number] DOB: 11/11/1980  Chief Complaint  Patient presents with   Diabetes   Hypertension   Medication Management    TAYLON MUSTAPHA is a 42 y.o. year old female who presented for a telephone visit.   They were referred to the pharmacist by their PCP for assistance in managing complex medication management.    Subjective:  Care Team: Primary Care Provider: [name], MD ; Next Scheduled Visit: 05/29/23  Medication Access/Adherence  Current Pharmacy:  [id number] - [place], [place] - [address] AT [facility] OF [address] [place] [address] Phone: [phone] Fax: [phone]  [id number] - [place]), [place] - [address]  ([place]) [place] [address] Phone: [phone] Fax: [phone]  [facility] [address] [place] [address] Phone: [phone] Fax: [phone]   Patient reports affordability concerns with their medications: No Patient reports access/transportation concerns to their pharmacy: No  Patient reports adherence concerns with their medications:  No   Diabetes:  Current medications: Metformin 1000mg  BID, Victoza 1.2mg  sq daily  Medications tried in the past: Rybelsus (cost-not covered by ins), Farxiga/Jardiance (yeast issues), Bydureon (not covered), Januvia (stopped due to cost concern in the past)        Patient denies hypoglycemic s/sx including dizziness, shakiness, sweating. Patient denies hyperglycemic symptoms including polyuria, polydipsia, polyphagia, nocturia, neuropathy, blurred vision.  Current meal patterns:  Has been referred to a nutritionist and is working on incorporating their recommendations of limited fried/fatty foods and being mindful of low-carb meal options  Current physical activity: has a gym membership but has not been  going as often as she would like  Reports some constipation with Victoza but is managing with OTC miralax/stool softener   Current medication access support: None  Hypertension:  Current medications: Valsartan 320mg  daily, Spironolactone 50mg  daily, Carvedilol 25mg  BID Medications previously tried: Amlodipine, Chlorthalidone, hydrochlorothiazide, Labetalol, Losartan, Methyldopa, Nifedipine  Patient has a validated, automated, upper arm home BP cuff In a study, where she has to check BP twice daily, two hours after medicine through her cardiology office Current blood pressure readings readings: seeing readings in the 130s/80s  Patient denies hypotensive s/sx including dizziness, lightheadedness.  Patient denies hypertensive symptoms including headache, chest pain, shortness of breath  Current meal patterns: Trying to be mindful of sodium  Current physical activity: see above    Objective:  Lab Results  Component Value Date   HGBA1C 8.4 (A) 02/20/2023    Lab Results  Component Value Date   CREATININE 0.68 11/07/2022   BUN 11 11/07/2022   NA 135 11/07/2022   K 4.1 11/07/2022   CL 103 11/07/2022   CO2 18 (L) 11/07/2022    Lab Results  Component Value Date   CHOL 161 04/17/2022   HDL 58 04/17/2022   LDLCALC 85 04/17/2022   TRIG 99 04/17/2022   CHOLHDL 2.8 04/17/2022    Medications Reviewed Today     Reviewed by [name], RPH (Pharmacist) on 04/15/23 at [phone]  Med List Status: <None>   Medication Order Taking? Sig Documenting Provider Last Dose Status Informant  acetaminophen (TYLENOL) 500 MG tablet [id number] Yes Take 1,000 mg by mouth as needed for moderate pain. [provider] Taking Active            Med Note Sharman Crate Feb 20, 2023  2:24 PM) Rochele Pages  2 last night  albuterol (PROVENTIL) (2.5 MG/3ML) 0.083% nebulizer solution [id number] Yes Take 3 mLs (2.5 mg total) by nebulization every 6 (six) hours as needed for wheezing or shortness  of breath. Waldon Merl, PA-C Taking Active   albuterol (VENTOLIN HFA) 108 (90 Base) MCG/ACT inhaler [id number] Yes Inhale 2 puffs into the lungs every 6 (six) hours as needed for wheezing or shortness of breath. Waldon Merl, PA-C Taking Active   atorvastatin (LIPITOR) 20 MG tablet [id number] Yes Take 1 tablet (20 mg total) by mouth daily. [name], MD Taking Active   carvedilol (COREG) 25 MG tablet [id number] Yes TAKE 1 TABLET BY MOUTH TWICE A [name], MD Taking Active   Cholecalciferol (VITAMIN D) 50 MCG (2000 UT) CAPS [id number] Yes Take 2 capsules by mouth daily. [provider] Taking Active Self           Med Note Jola Babinski Oct 23, 2020  4:34 AM)    Continuous Glucose Sensor (FREESTYLE LIBRE 3 SENSOR) [place] [id number] Yes 1 each by Does not apply route every 14 (fourteen) days. [name], MD Taking Active   ibuprofen (ADVIL) 600 MG tablet [id number] Yes Take 1 tablet (600 mg total) by mouth every 6 (six) hours as needed. [name], PA Taking Active            Med Note [name], [name] Feb 20, 2023  2:25 PM) As needed   Insulin Pen Needle 32G X 4 MM MISC [id number]  1 Needle by Does not apply route daily. [name], NP  Active   liraglutide (VICTOZA) 18 MG/3ML SOPN [id number] Yes Inject 0.6 mg subcutaneously daily for 1 week, then 1.2 mg [place] daily.  Can further increase to 1.8 mg [place] daily if needed based on blood sugars [name], MD Taking Active            Med Note [name]   Tue Apr 15, 2023  9:42 AM) Currently on 1.2mg  dose  loratadine (CLARITIN) 10 MG tablet [id number] Yes Take 10 mg by mouth daily. [provider] Taking Active            Med Note [name], [name] Feb 20, 2023  2:25 PM) As needed  metFORMIN (GLUCOPHAGE) 1000 MG tablet [id number] Yes Take 1 tablet (1,000 mg total) by mouth 2 (two) times daily with a meal. [name], MD Taking Active   methocarbamol (ROBAXIN) 500 MG tablet [id number]  Take 1-2  tablets (500-1,000 mg total) by mouth every 8 (eight) hours as needed for muscle spasms. [name], MD  Active            Med Note [name], [name] Feb 20, 2023  2:26 PM) As needed, rarely  Multiple Vitamins-Minerals (ONE A DAY WOMEN 50 PLUS PO) [id number] Yes Take 1 tablet by mouth daily. [provider] Taking Active            Med Note [name], Roma Schanz   Wed Apr 17, 2022 11:00 AM) With iron  oxymetazoline (AFRIN) 0.05 % nasal spray [id number]  Place 1 spray into both nostrils 2 (two) times daily. [provider]  Active            Med Note [name], [name] Feb 20, 2023  2:25 PM) As needed  predniSONE (DELTASONE) 20 MG tablet [id number] Yes Take 2 tablets (40 mg total) by  mouth daily with breakfast. Waldon Merl, PA-C Taking Active   spironolactone (ALDACTONE) 50 MG tablet [id number] Yes Take 1 tablet (50 mg total) by mouth daily. Alver Sorrow, NP Taking Active   valsartan (DIOVAN) 320 MG tablet [id number] Yes TAKE 1 TABLET BY MOUTH EVERY DAY [name], MD Taking Active               Assessment/Plan:   Diabetes: - Currently controlled with home readings/sensor report - Reviewed long term cardiovascular and renal outcomes of uncontrolled blood sugar - Reviewed goal A1c, goal fasting, and goal 2 hour post prandial glucose - Reviewed dietary modifications including low-carb diet - Reviewed lifestyle modifications including: achieving a total of of physical activity/week - Recommend to notify the office/myself IMMEDIATELY upon any delays or issues obtaining/taking medications  - Recommend to check glucose at least twice daily (fasting, post-prandial) with fasting sugars -Continue Victoza to 1.2mg , notify us if constipation worsens and does not stay resolved with consistent miralax use. -Consider increase to 1.8mg  next month if sugars stable and no side effect concerns for added weight loss benefit  Hypertension: - Currently  controlled - Reviewed long term cardiovascular and renal outcomes of uncontrolled blood pressure - Reviewed appropriate blood pressure monitoring technique and reviewed goal blood pressure. Recommended to check home blood pressure and heart rate twice daily - Recommend to limit salt in diet! Try to avoid fast food or make low salt changes (salad vs fries) -Continue medications therapy and BP monitoring routine prescribed by cardiology study   Follow Up Plan:1 month   [name], PharmD Clinical Pharmacist [phone]

## 2023-04-16 ENCOUNTER — Other Ambulatory Visit: Payer: Self-pay | Admitting: Family Medicine

## 2023-04-16 DIAGNOSIS — E118 Type 2 diabetes mellitus with unspecified complications: Secondary | ICD-10-CM

## 2023-04-17 ENCOUNTER — Other Ambulatory Visit: Payer: Self-pay | Admitting: *Deleted

## 2023-04-17 ENCOUNTER — Other Ambulatory Visit: Payer: Self-pay | Admitting: Family Medicine

## 2023-04-17 MED ORDER — FREESTYLE LIBRE 3 PLUS SENSOR MISC: 1.0000 | 2 refills | Status: DC

## 2023-04-17 NOTE — Telephone Encounter (Signed)
Yes, that is great.  Thanks.

## 2023-04-17 NOTE — Telephone Encounter (Signed)
Hi Angela,  I called the pharmacy and they said her insurance no longer covers. Do you have any time to look into this? I know you are working with this patient. I just changed her from the Freestyle 3 to the Plus since the 3 is no longer available, not sure if that has something to do with the denial. Let me know if you are help to assist. Thanks.  Suzette Battiest

## 2023-04-17 NOTE — Telephone Encounter (Signed)
Spoke with patient, she will pick up the samples this week. Placed in front office for pick up. Thank you!

## 2023-04-17 NOTE — Telephone Encounter (Signed)
Hey,  Her insurance will not cover the 3 plus, still not added to their formulary. None of the [facility] pharmacies have the plain 3 in stock right now either. We have samples if we can give her 2 boxes to get her through while we wait for insurance to update?

## 2023-04-21 NOTE — Addendum Note (Signed)
Addended by: Alver Sorrow on: 04/21/2023 05:25 PM   Modules accepted: Orders

## 2023-04-21 NOTE — Telephone Encounter (Signed)
Notice from [facility] and [facility] was denied.   She had prior sleep study 2021 and never started CPAP. Is now interested in CPAp to treat her uncontrolled hypertension.   Will route to Dr. Mayford Knife to see if we can instead get her started on AutoPAP.   Alver Sorrow, NP

## 2023-04-21 NOTE — Telephone Encounter (Signed)
They denied split night as "a test similar to the one requested has already been performed". They said repeat sleep study could be performed if:  "-BMI dropped by 10 % (it has not) with desire to stop PAP or not tolerating PAP -BMI gone up by 10% (it has not) -Results of prior testing did not show enough data and were unable to confirm a sleep problem  Repeat sleep testing is not supported when a new PAP device is needed."  Will order CPAP titration and hopefully that is approved. Sleep team can you please help to complete prior authorization? TY!  Alver Sorrow, NP

## 2023-04-22 ENCOUNTER — Telehealth: Payer: Self-pay

## 2023-04-22 NOTE — Telephone Encounter (Signed)
**Note De-Identified Ying Blankenhorn Obfuscation** Per [employer] Provider Portal: This case will be reviewed by a member of the [employer] within 48 hours of submission.  The prior authorization you submitted, Case [id number], has been received.  Thank you.

## 2023-04-22 NOTE — Addendum Note (Signed)
Addended by: Latashia Koch, Mashonda Broski C on: 04/22/2023 10:16 AM   Modules accepted: Orders

## 2023-04-22 NOTE — Telephone Encounter (Signed)
Canceled split night order as insurance denied this test. CPAP titration pre-cert pending.

## 2023-04-22 NOTE — Telephone Encounter (Signed)
Sent text reminder of appt today for intake at 530p Pt sts working today and requests to reschedule intake.  Sent via text alternatives for Wednesdy 11/27 and Monday 12/3.  Await confirmation back
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
## 2023-04-28 ENCOUNTER — Telehealth: Payer: Self-pay

## 2023-04-28 NOTE — Telephone Encounter (Signed)
Call to pt to inform PREP will not start on 04/29/23 as planned. Will call her when new date is set.

## 2023-05-02 NOTE — Telephone Encounter (Signed)
**Note De-Identified Jo Cerone Obfuscation** Per denial letter received from [employer]: Dear DR. CAITLIN WALKER: CPAP Titration [employer] cannot process your request, due to the following reason: This case was started in error, it is a duplicate study of a denied request for the same CPT code (82956 is for CPAP Titration as well as a Split Night Sleep Study) and the same ordering Provider within 45 days.  Per letter from [place] on 03/13/23: Split Night Sleep Study is Denied:  Reason given, similar test has already been performed. no additional testing is necessary at this time.  Per the pts chart, she had a CPAP Titration on 03/06/2020 but none since then.  Forwarding this message to Hubbard Hartshorn, NP as Lorain Childes.

## 2023-05-02 NOTE — Telephone Encounter (Signed)
Appreciate sleep team assisting with multiple PA's!  Dr. Mayford Knife - insurance has denied CPAP titration. Reasonable to proceed with Rx for AutoPAP?  Victoria Sorrow, NP

## 2023-05-07 ENCOUNTER — Other Ambulatory Visit: Payer: Self-pay | Admitting: Family Medicine

## 2023-05-07 DIAGNOSIS — E785 Hyperlipidemia, unspecified: Secondary | ICD-10-CM

## 2023-05-07 DIAGNOSIS — E118 Type 2 diabetes mellitus with unspecified complications: Secondary | ICD-10-CM

## 2023-05-09 NOTE — Telephone Encounter (Signed)
**Note De-Identified Victoria [name] Obfuscation** I called [employer] and s/w Jasmine and pre her recommendation, I started another CPAP Titration PA because I would have to do a peer to peer with a clinical reviewer which would be our last chance of getting an approval and per [facility] a peer to peer is normally done with the MD and the clinical reviewer.  I have faxed the pts last NPSG results from 11/19/2019 and her office visit notes from 02/27/2023 with Case Reference #: [id number] written on the cover letter and the pts name. DOB and Case reference # at the top of each page as directed by [facility]. I did receive confirmation that the fax was sent successfully.

## 2023-05-13 ENCOUNTER — Other Ambulatory Visit: Payer: 59

## 2023-05-13 NOTE — Progress Notes (Signed)
05/13/2023 Name: Victoria [name] MRN: [id number] DOB: 10/17/1980  Chief Complaint  Patient presents with   Diabetes   Hypertension   Medication Management    Victoria [name] is a 42 y.o. year old female who presented for a telephone visit.   They were referred to the pharmacist by their PCP for assistance in managing complex medication management.    Subjective:  Care Team: Primary Care Provider: [name], [name] ; Next Scheduled Visit: 05/29/23  Medication Access/Adherence  Current Pharmacy:  [id number] - [place], [place] - [address] AT [facility] [address] [place] [address] Phone: [phone] Fax: [phone]  [id number] - [place] - [address] [place] [address] Phone: [phone] Fax: [phone]  [facility] [place] [facility] [address] [place] [address] Phone: [phone] Fax: [phone]   Patient reports affordability concerns with their medications: No Patient reports access/transportation concerns to their pharmacy: No  Patient reports adherence concerns with their medications:  No   Diabetes:  Current medications: Metformin 1000mg  BID, Victoza 1.2mg  sq daily  Medications tried in the past: Rybelsus (cost-not covered by ins), Farxiga/Jardiance (yeast issues), Bydureon (not covered), Januvia (stopped due to cost concern in the past)    Patient denies hypoglycemic s/sx including dizziness, shakiness, sweating. Patient denies hyperglycemic symptoms including polyuria, polydipsia, polyphagia, nocturia, neuropathy, blurred vision.  Current meal patterns:  Has been referred to a nutritionist and is working on incorporating their recommendations of limited fried/fatty foods and being mindful of low-carb meal options. Admits to poor diet control the last 3-4 weeks with thanksi  Current  physical activity: has a Photographer but has not been going as often as she would like  Reports some constipation with Victoza but is managing with OTC miralax/stool softener/staying hydrated  Current medication access support: None  Hypertension:  Current medications: Valsartan 320mg  daily, Spironolactone 50mg  daily, Carvedilol 25mg  BID Medications previously tried: Amlodipine, Chlorthalidone, hydrochlorothiazide, Labetalol, Losartan, Methyldopa, Nifedipine  Patient has a validated, automated, upper arm home BP cuff -Not checking her BP routinely at home anymore, just forgot about it, checked on phone today and it was 157/93 but had not taken her BP meds yet this morning  Patient denies hypotensive s/sx including dizziness, lightheadedness.  Patient denies hypertensive symptoms including headache, chest pain, shortness of breath  Current meal patterns: Trying to be mindful of sodium  Current physical activity: see above    Objective:  Lab Results  Component Value Date   HGBA1C 8.4 (A) 02/20/2023    Lab Results  Component Value Date   CREATININE 0.68 11/07/2022   BUN 11 11/07/2022   NA 135 11/07/2022   K 4.1 11/07/2022   CL 103 11/07/2022   CO2 18 (L) 11/07/2022    Lab Results  Component Value Date   CHOL 161 04/17/2022   HDL 58 04/17/2022   LDLCALC 85 04/17/2022   TRIG 99 04/17/2022   CHOLHDL 2.8 04/17/2022    Medications Reviewed Today     Reviewed by Victoria [name], RPH (Pharmacist) on 05/13/23 at 0932  Med List Status: <None>   Medication Order Taking? Sig Documenting Provider Last Dose Status Informant  acetaminophen (TYLENOL) 500 MG tablet [id number] Yes Take 1,000 mg by mouth as needed for moderate pain. Provider, Historical, [name] Taking Active            Med Note Victoria [name]  Thu Feb 20, 2023  2:24 PM) Took 2 last night  albuterol (PROVENTIL) (2.5 MG/3ML) 0.083% nebulizer solution [id number] Yes Take 3 mLs (2.5 mg total) by nebulization every  6 (six) hours as needed for wheezing or shortness of breath. Victoria [name] Taking Active   albuterol (VENTOLIN HFA) 108 (90 Base) MCG/ACT inhaler [id number] Yes Inhale 2 puffs into the lungs every 6 (six) hours as needed for wheezing or shortness of breath. Victoria [name] Taking Active   atorvastatin (LIPITOR) 20 MG tablet [id number] Yes TAKE 1 TABLET BY MOUTH EVERY DAY Victoria [name] Taking Active   carvedilol (COREG) 25 MG tablet [id number] Yes TAKE 1 TABLET BY MOUTH TWICE A Victoria [name] Taking Active   Cholecalciferol (VITAMIN D) 50 MCG (2000 UT) CAPS [id number]  Take 2 capsules by mouth daily. Provider, Historical, [name]  Active Self           Med Note Victoria [name]   Mon Oct 23, 2020  4:34 AM)    Continuous Glucose Sensor (FREESTYLE LIBRE 3 PLUS SENSOR) [place] [id number] Yes 1 each by Does not apply route every 14 (fourteen) days. Change sensor every 15 days. Victoria [name] Taking Active   ibuprofen (ADVIL) 600 MG tablet [id number] Yes Take 1 tablet (600 mg total) by mouth every 6 (six) hours as needed. Victoria Garter, PA Taking Active            Med Note Victoria [name] Feb 20, 2023  2:25 PM) As needed   Insulin Pen Needle 32G X 4 MM MISC [id number]  1 Needle by Does not apply route daily. Victoria Eth, NP  Active   liraglutide (VICTOZA) 18 MG/3ML SOPN [id number] Yes Inject 0.6 mg subcutaneously daily for 1 week, then 1.2 mg [place] daily.  Can further increase to 1.8 mg [place] daily if needed based on blood sugars Victoria [name] Taking Active            Med Note Victoria [name]   Tue Apr 15, 2023  9:42 AM) Currently on 1.2mg  dose  loratadine (CLARITIN) 10 MG tablet [id number] Yes Take 10 mg by mouth daily. Provider, Historical, [name] Taking Active            Med Note Victoria [name] Feb 20, 2023  2:25 PM) As needed  metFORMIN (GLUCOPHAGE) 1000 MG tablet [id number] Yes TAKE 1 TABLET (1,000 MG TOTAL) BY MOUTH TWICE A DAY WITH FOOD Victoria [name] Taking Active    methocarbamol (ROBAXIN) 500 MG tablet [id number] Yes Take 1-2 tablets (500-1,000 mg total) by mouth every 8 (eight) hours as needed for muscle spasms. Victoria [name] Taking Active            Med Note Victoria [name] Feb 20, 2023  2:26 PM) As needed, rarely  Multiple Vitamins-Minerals (ONE A DAY WOMEN 50 PLUS PO) [id number] Yes Take 1 tablet by mouth daily. Provider, Historical, [name] Taking Active            Med Note Victoria [name], Roma Schanz   Wed Apr 17, 2022 11:00 AM) With iron  oxymetazoline (AFRIN) 0.05 % nasal spray [id number] Yes Place 1 spray into both nostrils 2 (two) times daily. Provider, Historical, [name] Taking Active            Med Note Victoria [name] Feb 20, 2023  2:25 PM) As needed  spironolactone (ALDACTONE) 50 MG  tablet [id number] Yes Take 1 tablet (50 mg total) by mouth daily. Alver Sorrow, NP Taking Active   valsartan (DIOVAN) 320 MG tablet [id number] Yes TAKE 1 TABLET BY MOUTH EVERY DAY Victoria [name] Taking Active               Assessment/Plan:   Diabetes: - Currently uncontrolled - Reviewed long term cardiovascular and renal outcomes of uncontrolled blood sugar - Reviewed goal A1c, goal fasting, and goal 2 hour post prandial glucose - Reviewed dietary modifications including low-carb diet - Reviewed lifestyle modifications including: achieving a total of of physical activity/week - Recommend to notify the office/myself IMMEDIATELY upon any delays or issues obtaining/taking medications  - Recommend to check glucose at least twice daily (fasting, post-prandial) with fasting sugars -Continue Victoza to 1.2mg , notify us if constipation worsens and does not stay resolved with consistent miralax use. -Recommended increase of Victoza to 1.8mg  but patient wants to stay on 1.2mg  over the holidays and will discuss with PCP at their visit on 05/29/23  Hypertension: - Currently uncontrolled - Reviewed long term cardiovascular and renal outcomes of  uncontrolled blood pressure - Reviewed appropriate blood pressure monitoring technique and reviewed goal blood pressure. Recommended to check home blood pressure and heart rate twice daily - Recommend to limit salt in diet! Try to avoid fast food or make low salt changes (salad vs fries) -Continue medications therapy and BP monitoring routine prescribed by cardiology study   Follow Up Plan: 06/30/22 at 9:30am phone call   Victoria [name], PharmD Clinical Pharmacist [phone]

## 2023-05-15 ENCOUNTER — Telehealth: Payer: Self-pay

## 2023-05-15 NOTE — Progress Notes (Signed)
   05/15/2023  Patient ID: Victoria [name], female   DOB: 05/06/1981, 42 y.o.   MRN: [id number]  Patient had expressed concerned that her freestyle libre 3 plus sensors (replaced the 3 as they are difficult to find right now and not in stock at any local pharmacies) were not covered with her insurance earlier this year.  Insurance now shows that the [place] 3 plus are covered but with a $75 copay for a 30 day supply. Price check for Dexcom G7 sensors show a copay of $49 for a 30 day supply.  Provided patient with 1 box  (15DS) of freestyle libre 3 plus sensor to get her to the new year. Also provided a Dexcom G7 reader and 1 sensor (10DS) so patient can trial and see if she prefers to use that at a cheaper price.  Patient will reach out and let us know if she prefers to stay on the libre 3 plus or switch to the Dexcom G7. Also instructed to call sooner if any issues or concerns.  [name], PharmD Clinical Pharmacist [phone]

## 2023-05-16 NOTE — Telephone Encounter (Addendum)
Fax came for patient from [employer] with a decision for approved services ( PSG0  with valid dates of 05/09/2023 to 11/09/2023. Reference # [id number].

## 2023-05-16 NOTE — Telephone Encounter (Signed)
Find to change the order. Thank you!  Alver Sorrow, NP

## 2023-05-20 ENCOUNTER — Other Ambulatory Visit: Payer: Self-pay | Admitting: Family Medicine

## 2023-05-20 DIAGNOSIS — E118 Type 2 diabetes mellitus with unspecified complications: Secondary | ICD-10-CM

## 2023-05-21 ENCOUNTER — Other Ambulatory Visit: Payer: Self-pay | Admitting: Family Medicine

## 2023-05-21 DIAGNOSIS — E1169 Type 2 diabetes mellitus with other specified complication: Secondary | ICD-10-CM

## 2023-05-22 NOTE — Telephone Encounter (Signed)
Pt has an appt in January

## 2023-05-22 NOTE — Telephone Encounter (Signed)
The order for NPSG has entered.

## 2023-05-22 NOTE — Addendum Note (Signed)
Addended by: Reesa Chew on: 05/22/2023 05:16 PM   Modules accepted: Orders

## 2023-05-23 NOTE — Telephone Encounter (Signed)
Spoke with patient to advise her that insurance approved an NPSG sleep study. Provided patient with number for Sleep Lab to schedule. Pt verbalized appreciation of call and assistance. She denied any questions at this time.

## 2023-05-26 ENCOUNTER — Telehealth: Payer: Self-pay

## 2023-05-26 NOTE — Telephone Encounter (Signed)
Connected with pt via text this am to offer next PREP classes at [facility].  Can only do an evening class. Advised will keep her on the list for an evening class once it is available.

## 2023-05-28 NOTE — Progress Notes (Signed)
 Chief Complaint  Patient presents with   Diabetes    Fasting med check. Compliant with vit d. Will r/s with cardiology. Did have eye exam in July with [facility] care, I will request. Has not seen GYN. No breathing issues. Victoza dose is 1.2 - Jon told her to remain there until seen.    She has been seeing pharmacist Jon to help with med compliance for her DM and HTN. Last visit was 2 weeks ago, scheduled again in another month.  She was referred for PREP classes at the [facility]. She is on a waiting list for evening classes.  Diabetes follow-up:  Last seen in 01/2023, at which time A1c was 8.4%.  Insurance didn't cover Rybelsus , so she was switched to Victoza injections daily (currently taking 1.2 mg).  She has some nausea if she overeats. She switched taking the injection to evening and tolerates it better. She continues on Metformin  1000mg  BID.   Constipation is being managed with stool softeners and miralax .  She previously didn't tolerate Trulicity  due to nausea, belching.  Previously didn't tolerate Farxiga /Jardiance due to yeast issues.   She saw dietician in October, but hasn't had the recommended follow-up.  She said she was told to follow-up if I need to.  Admits that her diet has been very poor related to Thanksgiving, her birthday, and the holidays. Eating pasta, lots of cookies and sweets. She got down to 272# prior to the holidays, per pt.  Sugars are running (per pt recall)--120-140's in the mornings, 2 hours PP 115-120's.  Bedtime sugars are 160.  She eats her largest meal for dinner.  Had pasta last night and it was 300. Per her phone app-- Over the last 30 days >250 8% 181-250 31% 70-180 61%  She has been working with our pharmacist, Jon.  2 weeks ago she was given samples of Freestyle Libre 3+ sensors, and a Dexcom G7 reader and 1 sensor to try, to see which she would prefer (Dexcom is less expensive, $49 copay, where as FreeStyle is $75 copay per month). She  plans to switch to Dexcom due to cost savings (hasn't tried it yet, still has a few days left on the freestyle sample).   Resistant HTN: She was referred back to the cardiologist, last seen in October. At that visit her spironolactone  dose was increased to 50 mg daily. She continues to take Valsartan  320mg  daily, Carvedilol  25mg  BID. Renal duplex was ordered in October, never done.  AM cortisol was ordered.  Patient didn't f/u to have this done, nor the f/u b-met that was ordered. Patient no-showed her November f/u appointment with cardiology.  BPs are running 130's/80's-90, last checked last week. She noticed improvement after spironolactone  dose was increased.  She was supposed to be in a study for BP, but she wasn't checking it during finals.  She denies headaches, dizziness, chest pain, shortness of breath, edema, muscle cramps.   OSA: This was addressed by cardiology, who is supposed to be arranging for sleep study, to get her back on CPAP.  She talked with [facility] sleep center last week, who stated they were backed up (but they had the order).   Vitamin D  deficiency:  Last level was 28.7 in 10/2022, when taking 4000 IU daily (but ran out a week prior to test). Previously had been 30.3 in 12/2021, when taking 2000 U daily.  It had been consistently low prior to that. She continues to take 4000 IU daily.  Hyperlipidemia:  She is taking atorvastatin  20mg  and denies side effects. Still using condoms for contraception, and aware of the potential risks of statins and pregnancy.  Previously reported planning to see OB-GYN to discuss BTL. She hasn't scheduled this appointment (and is long past due for GYN exam).  She no longer desires BTL--she would like another child, husband doesn't until her health is better, so they are very good about using condoms. Diet hasn't been as good during the holidays. Last lipids were at goal on last check in 03/2022, due to be rechecked.  Lab Results  Component  Value Date   CHOL 161 04/17/2022   HDL 58 04/17/2022   LDLCALC 85 04/17/2022   TRIG 99 04/17/2022   CHOLHDL 2.8 04/17/2022     Anemia: She reports that her menses remain heavy, monthly.  Cycles are more regular (no longer having 2x/month). She hadn't seen GYN in over 4 years, hasn't scheduled visit yet. She has been taking a MVI with iron , no separate iron . She eats liver pudding when on her cycles. Last Hgb was improved. Denies craving ice, fatigue, dizziness.   Lab Results  Component Value Date   WBC 6.6 11/07/2022   HGB 11.9 11/07/2022   HCT 36.3 11/07/2022   MCV 78 (L) 11/07/2022   PLT 311 11/07/2022   Lab Results  Component Value Date   FERRITIN 26 11/07/2022    Allergies and asthma: She has had many UC/ER visit for wheezing in the past  She was referred to allergist through [facility], but never saw them, as her insurance had changed.  Referral was placed upon returning care here, they tried contacting her 3x, then closed it, never got scheduled.  She called to schedule with allergist in September, after last visit, was told she needed another referral She didn't contact us  to ask for this.  She had e-visit the end of October for asthma exacerbation.  Treated with prednisone , and refilled her albuterol . She had been out of her inhaler. She needed to use her nebulizer for about a week. She takes claritin prn. She last needed albuterol  about a week ago.  Didn't feel that allergies were flaring, just felt short of breath.       PMH, PSH, SH reviewed  Outpatient Encounter Medications as of 05/29/2023  Medication Sig Note   atorvastatin  (LIPITOR) 20 MG tablet TAKE 1 TABLET BY MOUTH EVERY DAY    carvedilol  (COREG ) 25 MG tablet TAKE 1 TABLET BY MOUTH TWICE A DAY    Cholecalciferol (VITAMIN D ) 50 MCG (2000 UT) CAPS Take 2 capsules by mouth daily.    Continuous Glucose Sensor (FREESTYLE LIBRE 3 PLUS SENSOR) MISC 1 each by Does not apply route every 14 (fourteen) days. Change sensor  every 15 days.    liraglutide (VICTOZA) 18 MG/3ML SOPN Inject 0.6 mg subcutaneously daily for 1 week, then 1.2 mg [place] daily.  Can further increase to 1.8 mg [place] daily if needed based on blood sugars 05/29/2023: 1.2mg    metFORMIN  (GLUCOPHAGE ) 1000 MG tablet TAKE 1 TABLET (1,000 MG TOTAL) BY MOUTH TWICE A DAY WITH FOOD    Multiple Vitamins-Minerals (ONE A DAY WOMEN 50 PLUS PO) Take 1 tablet by mouth daily. 04/17/2022: With iron    spironolactone  (ALDACTONE ) 50 MG tablet Take 1 tablet (50 mg total) by mouth daily.    valsartan  (DIOVAN ) 320 MG tablet TAKE 1 TABLET BY MOUTH EVERY DAY    acetaminophen  (TYLENOL ) 500 MG tablet Take 1,000 mg by mouth as needed for  moderate pain. (Patient not taking: Reported on 05/29/2023) 05/29/2023: As needed   albuterol  (PROVENTIL ) (2.5 MG/3ML) 0.083% nebulizer solution Take 3 mLs (2.5 mg total) by nebulization every 6 (six) hours as needed for wheezing or shortness of breath. (Patient not taking: Reported on 05/29/2023) 05/29/2023: As needed   albuterol  (VENTOLIN  HFA) 108 (90 Base) MCG/ACT inhaler Inhale 2 puffs into the lungs every 6 (six) hours as needed for wheezing or shortness of breath. (Patient not taking: Reported on 05/29/2023) 05/29/2023: As needed   ibuprofen  (ADVIL ) 600 MG tablet Take 1 tablet (600 mg total) by mouth every 6 (six) hours as needed. (Patient not taking: Reported on 05/29/2023) 05/29/2023: As needed   Insulin  Pen Needle 32G X 4 MM MISC 1 Needle by Does not apply route daily. (Patient not taking: Reported on 05/29/2023)    loratadine (CLARITIN) 10 MG tablet Take 10 mg by mouth daily. (Patient not taking: Reported on 05/29/2023) 05/29/2023: As needed   methocarbamol  (ROBAXIN ) 500 MG tablet Take 1-2 tablets (500-1,000 mg total) by mouth every 8 (eight) hours as needed for muscle spasms. (Patient not taking: Reported on 05/29/2023) 05/29/2023: As needed, rarely   oxymetazoline (AFRIN) 0.05 % nasal spray Place 1 spray into both nostrils 2 (two) times daily. (Patient not taking:  Reported on 05/29/2023) 05/29/2023: As needed   No facility-administered encounter medications on file as of 05/29/2023.     ROS: no fever, chills, URI symptoms, headaches, dizziness, chest pain, shortness of breath with activity, GI or GU complaints.  Constipation is mild and controlled with miralax . +mild allergies, uses prn claritin Unrefreshed sleep, daytime somnolence. Moods are good. Heavy cycles continue Weight was down, but regained during the holidays.    PHYSICAL EXAM:  BP 130/86   Pulse 80   Ht 5' 3.5 (1.613 m)   Wt 282 lb 12.8 oz (128.3 kg)   BMI 49.31 kg/m   Wt Readings from Last 3 Encounters:  05/29/23 282 lb 12.8 oz (128.3 kg)  04/23/23 281 lb (127.5 kg)  02/27/23 282 lb (127.9 kg)   Well-appearing, pleasant, obese female in no distress HEENT: conjunctiva and sclera are clear, EOMI.  Neck: no lymphadenopathy, thyromegaly or mass, no bruit Heart: regular rate and rhythm, no murmur Lungs: clear bilaterally, no wheezes Back: no spinal tenderness or CVA tenderness Abdomen: soft, nontender, no mass Extremities: no edema, normal pulses. Normal sensation to monofilament. 2+ pulses (normal diabetic foot exam) Psych: normal mood, affect, hygiene and grooming Neuro: alert and oriented, normal gait  Lab Results  Component Value Date   HGBA1C 7.8 (A) 05/29/2023     ASSESSMENT/PLAN:  Diabetes mellitus type 2 with complications (HCC) - Plan: metFORMIN  (GLUCOPHAGE ) 1000 MG tablet, HgB A1c, CMP14+EGFR  Hyperlipidemia associated with type 2 diabetes mellitus (HCC) - due for recheck. Cont statin. Discussed need for contraception - Plan: Lipid panel  Essential hypertension - resistant; BP borderline (diastolics high at home); Cont low Na diet. To get renal duplex and schedule f/u with cardiology - Plan: CMP14+EGFR, Cortisol  Medication monitoring encounter - Plan: HgB A1c, CMP14+EGFR, Lipid panel  Menorrhagia with regular cycle - anemia had improved on last check.   Reminded to f/u with GYN  Hypertension associated with diabetes (HCC) - reminded of need for condoms/contraception while on ARB  OSA (obstructive sleep apnea) - waiting for in-lab sleep study to be scheduled, per cardiology, in order to get restarted on CPAP. +symptomatic  Vitamin D  deficiency - continue current dose  Asthma, unspecified asthma severity, unspecified whether complicated,  unspecified whether persistent - currently asymptomatic. Had flare 2 mos ago requiring prednisone , not on preventative meds. Refer to allergist - Plan: Ambulatory referral to Allergy  Allergic rhinitis due to pollen, unspecified seasonality - refer to allergist for further eval, given h/o frequent flares, triggering asthma - Plan: Ambulatory referral to Allergy  Resistant hypertension - being treated by cardiology, she missed last visit. Reminded to reschedule and get duplex - Plan: Cortisol   declined COVID booster today   Reminded to schedule f/u visit with cardiologist (she missed her November visit, has no f/u scheduled), and to schedule the ultrasound (renal duplex). Advised we will check the am cortisol and electrolytes, and will forward results to cardiologist.  Reminded again to schedule visit with GYN.  We discussed potential other contraception she could use (IUD, Depo, implants, etc), and discussed Plan B. Continue regular condom use.  Referring back to allergist, since clearly still has some ongoing issues, and has been referred twice, never seen. Last needed albuterol  recently, NOT related to flare of allergies or illness. She is agreeable to see them, reminded to call them back if/when they call (or they will again close out the referral, like last time).

## 2023-05-29 ENCOUNTER — Encounter: Payer: Self-pay | Admitting: Family Medicine

## 2023-05-29 ENCOUNTER — Ambulatory Visit (INDEPENDENT_AMBULATORY_CARE_PROVIDER_SITE_OTHER): Payer: 59 | Admitting: Family Medicine

## 2023-05-29 VITALS — BP 130/86 | HR 80 | Ht 63.5 in | Wt 282.8 lb

## 2023-05-29 DIAGNOSIS — I152 Hypertension secondary to endocrine disorders: Secondary | ICD-10-CM | POA: Diagnosis not present

## 2023-05-29 DIAGNOSIS — J45909 Unspecified asthma, uncomplicated: Secondary | ICD-10-CM

## 2023-05-29 DIAGNOSIS — I1A0 Resistant hypertension: Secondary | ICD-10-CM | POA: Diagnosis not present

## 2023-05-29 DIAGNOSIS — G4733 Obstructive sleep apnea (adult) (pediatric): Secondary | ICD-10-CM

## 2023-05-29 DIAGNOSIS — E785 Hyperlipidemia, unspecified: Secondary | ICD-10-CM | POA: Diagnosis not present

## 2023-05-29 DIAGNOSIS — N920 Excessive and frequent menstruation with regular cycle: Secondary | ICD-10-CM

## 2023-05-29 DIAGNOSIS — E559 Vitamin D deficiency, unspecified: Secondary | ICD-10-CM

## 2023-05-29 DIAGNOSIS — Z5181 Encounter for therapeutic drug level monitoring: Secondary | ICD-10-CM

## 2023-05-29 DIAGNOSIS — I10 Essential (primary) hypertension: Secondary | ICD-10-CM

## 2023-05-29 DIAGNOSIS — E118 Type 2 diabetes mellitus with unspecified complications: Secondary | ICD-10-CM | POA: Diagnosis not present

## 2023-05-29 DIAGNOSIS — E1169 Type 2 diabetes mellitus with other specified complication: Secondary | ICD-10-CM

## 2023-05-29 DIAGNOSIS — E1159 Type 2 diabetes mellitus with other circulatory complications: Secondary | ICD-10-CM | POA: Diagnosis not present

## 2023-05-29 DIAGNOSIS — J301 Allergic rhinitis due to pollen: Secondary | ICD-10-CM

## 2023-05-29 DIAGNOSIS — D509 Iron deficiency anemia, unspecified: Secondary | ICD-10-CM

## 2023-05-29 LAB — POCT GLYCOSYLATED HEMOGLOBIN (HGB A1C): Hemoglobin A1C: 7.8 % — AB (ref 4.0–5.6)

## 2023-05-29 MED ORDER — METFORMIN HCL 1000 MG PO TABS: 1000.0000 mg | ORAL_TABLET | Freq: Two times a day (BID) | ORAL | 1 refills | Status: DC

## 2023-05-29 NOTE — Patient Instructions (Addendum)
 Please schedule an appointment with your GYN, as you are long overdue. Remember about Plan B in case there are issues with the condom (falls off, breaks, not used, etc) in order to prevent a pregnancy. Consider discussing other contraceptive options with your GYN (ie IUD, Depo Provera, etc), which can be removed/stopped when pregnancy is desired.  Avoid using Afrin regularly. If having congestion due to allergies, use daily Flonase  instead. You will need to use this every day for it work the best.  I will put in another referral to the allergist.  Please contact the cardiologist to schedule an appointment. I will forward them the labs from today. You need to get the ultrasound of the kidney scheduled that they ordered.  Increase your Victoza to the 1.8 mg dose.

## 2023-05-30 LAB — CMP14+EGFR
ALT: 12 [IU]/L (ref 0–32)
AST: 11 [IU]/L (ref 0–40)
Albumin: 4 g/dL (ref 3.9–4.9)
Alkaline Phosphatase: 76 [IU]/L (ref 44–121)
BUN/Creatinine Ratio: 14 (ref 9–23)
BUN: 10 mg/dL (ref 6–24)
Bilirubin Total: <0.2 mg/dL (ref 0.0–1.2)
CO2: 20 mmol/L (ref 20–29)
Calcium: 9.7 mg/dL (ref 8.7–10.2)
Chloride: 104 mmol/L (ref 96–106)
Creatinine, Ser: 0.74 mg/dL (ref 0.57–1.00)
Globulin, Total: 3 g/dL (ref 1.5–4.5)
Glucose: 152 mg/dL — ABNORMAL HIGH (ref 70–99)
Potassium: 4.8 mmol/L (ref 3.5–5.2)
Sodium: 137 mmol/L (ref 134–144)
Total Protein: 7 g/dL (ref 6.0–8.5)
eGFR: 105 mL/min/{1.73_m2} (ref >59)

## 2023-05-30 LAB — LIPID PANEL
Chol/HDL Ratio: 2.7 {ratio} (ref 0.0–4.4)
Cholesterol, Total: 141 mg/dL (ref 100–199)
HDL: 52 mg/dL (ref >39)
LDL Chol Calc (NIH): 67 mg/dL (ref 0–99)
Triglycerides: 124 mg/dL (ref 0–149)
VLDL Cholesterol Cal: 22 mg/dL (ref 5–40)

## 2023-05-30 LAB — CORTISOL: Cortisol: 8.4 ug/dL (ref 6.2–19.4)

## 2023-05-30 MED ORDER — ATORVASTATIN CALCIUM 20 MG PO TABS: 20.0000 mg | ORAL_TABLET | Freq: Every day | ORAL | 3 refills | Status: AC

## 2023-06-20 ENCOUNTER — Encounter (HOSPITAL_BASED_OUTPATIENT_CLINIC_OR_DEPARTMENT_OTHER): Payer: Self-pay

## 2023-06-20 ENCOUNTER — Ambulatory Visit (INDEPENDENT_AMBULATORY_CARE_PROVIDER_SITE_OTHER): Payer: 59

## 2023-06-20 DIAGNOSIS — I10 Essential (primary) hypertension: Secondary | ICD-10-CM | POA: Diagnosis not present

## 2023-06-20 DIAGNOSIS — I1A0 Resistant hypertension: Secondary | ICD-10-CM

## 2023-06-22 ENCOUNTER — Encounter (HOSPITAL_BASED_OUTPATIENT_CLINIC_OR_DEPARTMENT_OTHER): Payer: Self-pay

## 2023-06-26 ENCOUNTER — Encounter (HOSPITAL_BASED_OUTPATIENT_CLINIC_OR_DEPARTMENT_OTHER): Payer: 59 | Admitting: Family

## 2023-06-27 ENCOUNTER — Encounter (HOSPITAL_BASED_OUTPATIENT_CLINIC_OR_DEPARTMENT_OTHER): Payer: 59 | Admitting: Family
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
## 2023-07-01 ENCOUNTER — Other Ambulatory Visit (INDEPENDENT_AMBULATORY_CARE_PROVIDER_SITE_OTHER): Payer: 59

## 2023-07-01 DIAGNOSIS — E118 Type 2 diabetes mellitus with unspecified complications: Secondary | ICD-10-CM

## 2023-07-01 DIAGNOSIS — I10 Essential (primary) hypertension: Secondary | ICD-10-CM

## 2023-07-01 MED ORDER — FREESTYLE LIBRE 3 PLUS SENSOR MISC: 1.0000 | 2 refills | Status: DC

## 2023-07-01 NOTE — Progress Notes (Signed)
 07/01/2023 Name: Victoria [name] MRN: [id number] DOB: Oct 20, 1980  Chief Complaint  Patient presents with   Medication Management   Diabetes   Hypertension    Victoria [name] [name] a 43 y.o. year old female who presented for a telephone visit.   They were referred to the pharmacist by their PCP for assistance in managing complex medication management.    Subjective:  Care Team: Primary Care Provider: Randol Dawes, [name] ; Next Scheduled Visit: 08/28/23  Medication Access/Adherence  Current Pharmacy:  [id number] - [place], [place] - [address] AT [facility] [address] [place] [address] Phone: [phone] Fax: [phone]  [name] - [place] - [address] [place] ([place] [address] Phone: [phone] Fax: [phone]  [facility] [address] [place] [address] Phone: [phone] Fax: [phone]   Patient reports affordability concerns with their medications: No Patient reports access/transportation concerns to their pharmacy: No  Patient reports adherence concerns with their medications:  No   Diabetes:  Current medications: Metformin  1000mg  BID, Victoza 1.2mg  sq daily  Medications tried in the past: Rybelsus  (cost-not covered by ins), Farxiga [name] (yeast issues), Bydureon  (not covered), Januvia  (stopped due to cost concern in the past), Did not tolerate dose increase of Victoza to 1.8mg  (severe nausea, could not keep food down, belching).  Current Glucose Readings: Out of her freestyle sensors, has been pricking finger the last week, denies any readings outside of desired range.   Patient denies hypoglycemic s/sx including dizziness, shakiness, sweating. Patient denies hyperglycemic symptoms including polyuria, polydipsia, polyphagia, nocturia, neuropathy, blurred vision.  Current meal  patterns:  Has been referred to a nutritionist and [name] working on incorporating their recommendations of limited fried/fatty foods and being mindful of low-carb meal options. Reports she has been much better with her diet choices in the new year now that the holidays have passed  Current physical activity: has a gym membership but has not been going as often as she would like, waiting for her [employer] classes to start, pending scheudling  Reports some constipation with Victoza but [name] managing with OTC miralax /stool softener/staying hydrated  Current medication access support: None  Hypertension:  Current medications: Valsartan  320mg  daily, Spironolactone  50mg  daily, Carvedilol  25mg  BID Medications previously tried: Amlodipine , Chlorthalidone , hydrochlorothiazide , Labetalol , Losartan , Methyldopa , Nifedipine   Patient has a validated, automated, upper arm home BP cuff -Checking every 2-3 days, reports most readings under 140/90, was at 140/90 yesterday  Patient denies hypotensive s/sx including dizziness, lightheadedness.  Patient denies hypertensive symptoms including headache, chest pain, shortness of breath  Current meal patterns: Trying to be mindful of sodium  Current physical activity: see above    Objective:  Lab Results  Component Value Date   HGBA1C 7.8 (A) 05/29/2023    Lab Results  Component Value Date   CREATININE 0.74 05/29/2023   BUN 10 05/29/2023   NA 137 05/29/2023   K 4.8 05/29/2023   CL 104 05/29/2023   CO2 20 05/29/2023    Lab Results  Component Value Date   CHOL 141 05/29/2023   HDL 52 05/29/2023   LDLCALC 67 05/29/2023   TRIG 124 05/29/2023   CHOLHDL 2.7 05/29/2023    Medications Reviewed Today     Reviewed by Victoria [name], RPH (Pharmacist) on 07/01/23 at [phone]  Med List Status: <None>   Medication Order Taking? Sig Documenting Provider Last Dose Status Informant  acetaminophen  (  TYLENOL ) 500 MG tablet [id number] Yes Take 1,000 mg by mouth as  needed for moderate pain (pain score 4-6). Provider, Historical, [name] Taking Active            Med Note Victoria [name] May 29, 2023  9:29 AM) As needed  albuterol  (PROVENTIL ) (2.5 MG/3ML) 0.083% nebulizer solution [id number] Yes Take 3 mLs (2.5 mg total) by nebulization every 6 (six) hours as needed for wheezing or shortness of breath. Victoria [name] Taking Active            Med Note BEVERLEE, Victoria [name]   Thu May 29, 2023  9:29 AM) As needed  albuterol  (VENTOLIN  HFA) 108 [phone] Base) MCG/ACT inhaler [id number] Yes Inhale 2 puffs into the lungs every 6 (six) hours as needed for wheezing or shortness of breath. Victoria [name] Taking Active            Med Note BEVERLEE, Victoria [name]   Thu May 29, 2023  9:29 AM) As needed  atorvastatin  (LIPITOR) 20 MG tablet [id number] Yes Take 1 tablet (20 mg total) by mouth daily. Victoria [name] Taking Active   carvedilol  (COREG ) 25 MG tablet [id number] Yes TAKE 1 TABLET BY MOUTH TWICE A DAY Victoria [name] Taking Active   Cholecalciferol (VITAMIN D ) 50 MCG (2000 UT) CAPS [id number] Yes Take 2 capsules by mouth daily. Provider, Historical, [name] Taking Active Self           Med Note Victoria [name]   Mon Oct 23, 2020  4:34 AM)    Continuous Glucose Sensor (FREESTYLE LIBRE 3 PLUS SENSOR) [place] [id number]  1 each by Does not apply route every 14 (fourteen) days. Change sensor every 15 days. Victoria [name]  Active   ibuprofen  (ADVIL ) 600 MG tablet [id number] Yes Take 1 tablet (600 mg total) by mouth every 6 (six) hours as needed. Victoria [name] Taking Active            Med Note Victoria [name] May 29, 2023  9:30 AM) As needed  Insulin  Pen Needle 32G X 4 MM MISC [id number]  1 Needle by Does not apply route daily.  Patient not taking: Reported on 05/29/2023   Victoria [name]  Active   liraglutide (VICTOZA) 18 MG/3ML SOPN [id number] Yes Inject 0.6 mg subcutaneously daily for 1 week, then 1.2 mg [place] daily.  Can further increase to 1.8 mg [place] daily if needed  based on blood sugars Victoria [name] Taking Active            Med Note Victoria [name]   Tue Jul 01, 2023  9:35 AM) 1.8mg  dose now  loratadine (CLARITIN) 10 MG tablet [id number] Yes Take 10 mg by mouth daily. Provider, Historical, [name] Taking Active            Med Note Victoria [name] May 29, 2023  9:30 AM) As needed  metFORMIN  (GLUCOPHAGE ) 1000 MG tablet [id number] Yes Take 1 tablet (1,000 mg total) by mouth 2 (two) times daily with a meal. Victoria [name] Taking Active   methocarbamol  (ROBAXIN ) 500 MG tablet [id number] Yes Take 1-2 tablets (500-1,000 mg total) by mouth every 8 (eight) hours as needed for muscle spasms. Victoria [name] Taking Active            Med Note Victoria [name] May 29, 2023  9:30 AM) As needed, rarely  Multiple  Vitamins-Minerals (ONE A DAY WOMEN 50 PLUS PO) [id number] Yes Take 1 tablet by mouth daily. Provider, Historical, [name] Taking Active            Med Note BEVERLEE, LUCIENNE FALCON   Wed Apr 17, 2022 11:00 AM) With iron   oxymetazoline (AFRIN) 0.05 % nasal spray [id number] No Place 1 spray into both nostrils 2 (two) times daily.  Patient not taking: Reported on 07/01/2023   Provider, Historical, [name] Not Taking Active            Med Note BEVERLEE, LUCIENNE FALCON Victoria [name] May 29, 2023  9:30 AM) As needed  spironolactone  (ALDACTONE ) 50 MG tablet [id number] Yes Take 1 tablet (50 mg total) by mouth daily. Vannie Reche RAMAN, [name] Taking Active   valsartan  (DIOVAN ) 320 MG tablet [id number] Yes TAKE 1 TABLET BY MOUTH EVERY DAY Victoria [name] Taking Active               Assessment/Plan:   Diabetes: - Currently uncontrolled but improving - Reviewed long term cardiovascular and renal outcomes of uncontrolled blood sugar - Reviewed goal A1c, goal fasting, and goal 2 hour post prandial glucose - Reviewed dietary modifications including low-carb diet - Reviewed lifestyle modifications including: achieving a total of of physical activity/week - Recommend to notify the  office/myself IMMEDIATELY upon any delays or issues obtaining/taking medications  - Recommend to check glucose at least twice daily (fasting, post-prandial) with fasting sugars -Continue Victoza to 1.2mg , notify us  if constipation worsens and does not stay resolved with consistent miralax  use. -Counseled heavily on diet choices, renewing CGM sensors per [facility] standing protocol, patient wants to remain on freestyle libre 3 plus  Hypertension: - Currently controlled - Reviewed long term cardiovascular and renal outcomes of uncontrolled blood pressure - Reviewed appropriate blood pressure monitoring technique and reviewed goal blood pressure. Recommended to check home blood pressure and heart rate twice daily - Recommend to limit salt in diet! Try to avoid fast food or make low salt changes (salad vs fries) -Continue medications therapy and BP monitoring routine prescribed by cardiology study   Follow Up Plan: 07/31/22 at 9:30am phone call   [name], PharmD Clinical Pharmacist [phone]

## 2023-07-02 ENCOUNTER — Other Ambulatory Visit: Payer: Self-pay | Admitting: Internal Medicine

## 2023-07-02 ENCOUNTER — Encounter: Payer: Self-pay | Admitting: Internal Medicine

## 2023-07-02 ENCOUNTER — Ambulatory Visit (INDEPENDENT_AMBULATORY_CARE_PROVIDER_SITE_OTHER): Payer: Medicaid Other | Admitting: Internal Medicine

## 2023-07-02 ENCOUNTER — Other Ambulatory Visit: Payer: Self-pay | Admitting: Family Medicine

## 2023-07-02 VITALS — BP 142/92 | HR 82 | Temp 98.0°F | Ht 64.0 in | Wt 285.7 lb

## 2023-07-02 DIAGNOSIS — J4530 Mild persistent asthma, uncomplicated: Secondary | ICD-10-CM | POA: Diagnosis not present

## 2023-07-02 DIAGNOSIS — J3089 Other allergic rhinitis: Secondary | ICD-10-CM | POA: Diagnosis not present

## 2023-07-02 DIAGNOSIS — L272 Dermatitis due to ingested food: Secondary | ICD-10-CM

## 2023-07-02 MED ORDER — FLUTICASONE PROPIONATE 50 MCG/ACT NA SUSP: 2.0000 | Freq: Every day | NASAL | 5 refills | Status: AC

## 2023-07-02 MED ORDER — ALBUTEROL SULFATE HFA 108 (90 BASE) MCG/ACT IN AERS: 2.0000 | INHALATION_SPRAY | Freq: Four times a day (QID) | RESPIRATORY_TRACT | 1 refills | Status: AC | PRN

## 2023-07-02 MED ORDER — ALBUTEROL SULFATE (2.5 MG/3ML) 0.083% IN NEBU: 2.5000 mg | INHALATION_SOLUTION | Freq: Four times a day (QID) | RESPIRATORY_TRACT | 1 refills | Status: AC | PRN

## 2023-07-02 MED ORDER — LEVOCETIRIZINE DIHYDROCHLORIDE 5 MG PO TABS: 5.0000 mg | ORAL_TABLET | Freq: Every evening | ORAL | 5 refills | Status: AC

## 2023-07-02 MED ORDER — ASMANEX HFA 50 MCG/ACT IN AERO: 2.0000 | INHALATION_SPRAY | Freq: Two times a day (BID) | RESPIRATORY_TRACT | 5 refills | Status: DC

## 2023-07-02 NOTE — Telephone Encounter (Signed)
 Can you do a PA on this for the pt.

## 2023-07-02 NOTE — Patient Instructions (Addendum)
 Mild Persistent Asthma: - Spirometry today was normal. Symptoms uncontrolled with frequent albuterol  use, discussed starting controller ICS. MDI technique discussed.   - Maintenance inhaler: start Asmanex  50mcg 2 puffs twice daily  - Rescue inhaler: Albuterol  2 puffs via spacer or 1 vial via nebulizer every 6 hours as needed for respiratory symptoms of cough, shortness of breath, or wheezing Asthma control goals:  Full participation in all desired activities (may need albuterol  before activity) Albuterol  use two times or less a week on average (not counting use with activity) Cough interfering with sleep two times or less a month Oral steroids no more than once a year No hospitalizations   Other Allergic Rhinitis: - Due to turbinate hypertrophy, seasonal symptoms and unresponsive to over the counter meds, will perform skin testing to identify aeroallergen triggers.   - Use nasal saline rinses before nose sprays such as with Neilmed Sinus Rinse.  Use distilled water.   - Use Flonase  2 sprays each nostril daily. Aim upward and outward. - Use Xyzal  5mg  daily.    Food Allergy:  - please strictly avoid treenuts and strawberry. Discussed fresh peanut reaction is not a true IgE mediated food allergy as she is able to tolerate peanut butter.  - Initial rxn: treenuts resulted in rash on back, strawberry causes her to break out in itchy rash.  Also reported tolerating peanut butter but fresh peanuts cause rashes.  - for SKIN only reaction, okay to take Benadryl  25mg  capsules every 6 hours as needed   Hold all anti-histamines (Xyzal , Allegra , Zyrtec, Claritin, Benadryl , Pepcid ) 3 days prior to next visit.  Follow up: 8:30 AM 2/12 for skin testing 1-55, treenuts, strawberry
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
## 2023-07-03 ENCOUNTER — Telehealth: Payer: Self-pay

## 2023-07-03 ENCOUNTER — Other Ambulatory Visit (HOSPITAL_COMMUNITY): Payer: Self-pay

## 2023-07-03 NOTE — Telephone Encounter (Signed)
 Pharmacy Patient Advocate Encounter   Received notification from [facility] /physicians office that prior authorization for FreeStyle Libre 3 Plus Sensor is required/requested.   Insurance verification completed.   The patient is insured through [facility] .   Per test claim: PA required; PA submitted to above mentioned insurance via CoverMyMeds Key/confirmation #/EOC (Key: [id number]) Status is pending

## 2023-07-03 NOTE — Telephone Encounter (Signed)
 Pharmacy Patient Advocate Encounter   Received notification from Physician's Office that prior authorization for FreeStyle Libre 3 Plus Sensor  is required/requested.   Insurance verification completed.   The patient is insured through [facility] MEDICAID/ SECONDARY INS.   Per test claim: PA required; PA submitted to above mentioned insurance via CoverMyMeds Key/confirmation #/EOC (Key: [id number])    Status is pending      I Have also ran a test claim showing that FreeStyle Libre 3 plus sensor is covered once and requires a p/a (which has been initiated above and is currently pending)

## 2023-07-04 NOTE — Telephone Encounter (Signed)
 Pharmacy Patient Advocate Encounter   Received notification from [facility] that Prior Authorization for {FreeStyle Libre 3 Plus Sensor  has been DENIED.  Full denial letter will be uploaded to the media tab. See denial reason below.     [employer] products are the plans pref'd

## 2023-07-04 NOTE — Telephone Encounter (Signed)
 Pharmacy Patient Advocate Encounter  (SECONDARY INSURANCE DENIAL/MEDICAID)  Received notification from [facility] Medicaid that Prior Authorization for FreeStyle Libre 3 Plus Sensor has been DENIED.  Full denial letter will be uploaded to the media tab. See denial reason below.  PT SECONDARY PLAN REQUIRES THE PT TO BE INSULIN  DEPENDENT

## 2023-07-07 NOTE — Telephone Encounter (Signed)
 Spoke with patient and she has new insurance (Medicaid) and it is now covered.

## 2023-07-07 NOTE — Telephone Encounter (Signed)
 Left message for patient, just need to know which pharmacy.

## 2023-07-07 NOTE — Telephone Encounter (Signed)
 Not covered by ins. Per note in chart from PA team.

## 2023-07-08 ENCOUNTER — Other Ambulatory Visit (HOSPITAL_COMMUNITY): Payer: Self-pay

## 2023-07-08 NOTE — Telephone Encounter (Signed)
I put notes on the info from yesterday. I called patient to find out where she wanted the [facility] sent. She called me back and let me know that she had new insurance and Freestyle was approved and she picked it up.

## 2023-07-08 NOTE — Telephone Encounter (Signed)
I called her again today to confirm again. She 100% picked up the [employer] yesterday as she has new insurance.

## 2023-07-09 ENCOUNTER — Other Ambulatory Visit: Payer: Self-pay | Admitting: Family Medicine

## 2023-07-09 ENCOUNTER — Ambulatory Visit: Payer: 59 | Admitting: Internal Medicine

## 2023-07-09 DIAGNOSIS — I10 Essential (primary) hypertension: Secondary | ICD-10-CM

## 2023-07-24 ENCOUNTER — Ambulatory Visit (HOSPITAL_BASED_OUTPATIENT_CLINIC_OR_DEPARTMENT_OTHER): Payer: 59 | Attending: Family | Admitting: Cardiology

## 2023-07-24 DIAGNOSIS — G4733 Obstructive sleep apnea (adult) (pediatric): Secondary | ICD-10-CM | POA: Diagnosis not present

## 2023-07-31 ENCOUNTER — Other Ambulatory Visit (INDEPENDENT_AMBULATORY_CARE_PROVIDER_SITE_OTHER): Payer: 59

## 2023-07-31 DIAGNOSIS — E118 Type 2 diabetes mellitus with unspecified complications: Secondary | ICD-10-CM

## 2023-07-31 DIAGNOSIS — I10 Essential (primary) hypertension: Secondary | ICD-10-CM

## 2023-07-31 NOTE — Progress Notes (Signed)
 07/31/2023 Name: Victoria [name] MRN: [id number] DOB: May 16, 1981  Chief Complaint  Patient presents with   Diabetes   Hypertension   Medication Management    Victoria [name] is a 43 y.o. year old female who presented for a telephone visit.   They were referred to the pharmacist by their PCP for assistance in managing complex medication management.    Subjective:  Care Team: Primary Care Provider: [name], MD ; Next Scheduled Visit: 08/28/23  Medication Access/Adherence  [facility]  [id number] [place] [place] - [address] AT [facility] [address] [place] [address] Phone: [phone] Fax: [phone]  [id number] - [place] - [address] [place] [address] Phone: [phone] Fax: [phone]  [facility] [address] [place] [address] Phone: [phone] Fax: [phone]   Patient reports affordability concerns with their medications: No Patient reports access/transportation concerns to their pharmacy: No  Patient reports adherence concerns with their medications:  No   Diabetes:  Current medications: Metformin 1000mg  BID, Victoza 1.2mg  sq daily  Medications tried in the past: Rybelsus (cost-not covered by ins), Farxiga/Jardiance (yeast issues), Bydureon (not covered), Januvia (stopped due to cost concern in the past), Did not tolerate dose increase of Victoza to 1.8mg  (severe nausea, could not keep food down, belching).  Current Glucose Readings:    Patient went 5 days without a sensor, forgot to put one on. Also has missed last 4 days of victoza because she was out of pen needles, waiting for them to arrive via [name] (cheaper).   Patient denies hypoglycemic s/sx including dizziness, shakiness, sweating. Patient denies hyperglycemic symptoms including polyuria,  polydipsia, polyphagia, nocturia, neuropathy, blurred vision.  Current meal patterns:  Has been referred to a nutritionist and is working on incorporating their recommendations of limited fried/fatty foods and being mindful of low-carb meal options. 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  Patient denies hypertensive symptoms including headache, chest pain, shortness of breath  Current meal patterns: Trying to be mindful of sodium  Current physical activity: see above    Objective:  Lab Results  Component Value Date   HGBA1C 7.8 (A) 05/29/2023    Lab Results  Component Value Date   CREATININE 0.74 05/29/2023   BUN 10 05/29/2023   NA 137 05/29/2023   K 4.8 05/29/2023   CL 104 05/29/2023   CO2 20 05/29/2023    Lab Results  Component Value Date   CHOL 141 05/29/2023   HDL 52 05/29/2023   LDLCALC 67 05/29/2023   TRIG 124 05/29/2023   CHOLHDL 2.7 05/29/2023    Medications Reviewed Today     Reviewed by [name], RPH (Pharmacist) on 07/31/23 at 0932  Med List Status: <None>  Medication Order Taking? Sig Documenting Provider Last Dose Status Informant  acetaminophen (TYLENOL) 500 MG tablet [id number] Yes Take 1,000 mg by mouth  as needed for moderate pain (pain score 4-6). [provider] Taking Active            Med Note [name], [name] May 29, 2023  9:29 AM) As needed  albuterol (PROVENTIL) (2.5 MG/3ML) 0.083% nebulizer solution [id number] Yes Take 3 mLs (2.5 mg total) by nebulization every 6 (six) hours as needed for wheezing or shortness of breath. [name], MD Taking Active   albuterol (VENTOLIN HFA) 108 (90 Base) MCG/ACT inhaler [id number] Yes Inhale 2 puffs into the lungs every 6 (six) hours as needed for wheezing or shortness of breath. [name], MD Taking Active   atorvastatin (LIPITOR) 20 MG tablet [id number] Yes Take 1 tablet (20 mg total) by mouth daily. [name], MD Taking Active   carvedilol (COREG) 25 MG tablet [id number] Yes TAKE 1 TABLET BY MOUTH TWICE A [name], MD Taking Active   Cholecalciferol (VITAMIN D) 50 MCG (2000 UT) CAPS [id number] Yes Take 2 capsules by mouth daily. [provider] Taking Active Self           Med Note [name]   Mon Oct 23, 2020  4:34 AM)    Continuous Glucose Sensor (FREESTYLE LIBRE 3 PLUS SENSOR) [place] [id number] Yes 1 each by Does not apply route every 14 (fourteen) days. Change sensor every 15 days. [name], MD Taking Active   fluticasone [facility] Xxx) 50 MCG/ACT nasal spray [id number] Yes Place 2 sprays into both nostrils daily. [name], MD Taking Active   ibuprofen (ADVIL) 600 MG tablet [id number] Yes Take 1 tablet (600 mg total) by mouth every 6 (six) hours as needed. [name], PA Taking Active            Med Note [name], [name]   Thu May 29, 2023  9:30 AM) As needed  Insulin Pen Needle 32G X 4 MM MISC [id number]  1 Needle by Does not apply route daily. [name], NP  Active   levocetirizine (XYZAL) 5 MG tablet [id number] Yes Take 1 tablet (5 mg total) by mouth every evening. [name], MD Taking Active   liraglutide (VICTOZA) 18 MG/3ML SOPN [id number] Yes Inject 0.6 mg subcutaneously daily for 1 week,  then 1.2 mg [place] daily.  Can further increase to 1.8 mg [name] daily if needed based on blood sugars [name], MD Taking Active            Med Note [name]   Tue Jul 01, 2023  9:58 AM) Jovita Gamma herself 1.8mg  x 2 weeks, was not able to tolerate, went back to 1.2mg   loratadine (CLARITIN) 10 MG tablet [id number]  Take 10 mg by mouth daily. [provider]  Active            Med Note [name], [name]   Thu May 29, 2023  9:30 AM) As needed  metFORMIN (GLUCOPHAGE) 1000 MG tablet [id number] Yes Take 1 tablet (1,000 mg total) by mouth 2 (two) times daily with a meal. [name], MD Taking Active   methocarbamol (ROBAXIN) 500 MG tablet [id number] Yes Take 1-2 tablets (500-1,000 mg total) by mouth every 8 (eight) hours as needed for muscle spasms. [name], MD Taking Active            Med Note [place], [place] F   Thu  May 29, 2023  9:30 AM) As needed, rarely  Mometasone Furoate [facility] HFA) 50 MCG/ACT AERO [id number]  INHALE 2 PUFFS INTO THE LUNGS IN THE MORNING AND AT BEDTIME. [name], MD  Active   Multiple Vitamins-Minerals (ONE A DAY WOMEN 50 PLUS PO) [id number] Yes Take 1 tablet by mouth daily. [provider] Taking Active            Med Note [name], Roma Schanz   Wed Apr 17, 2022 11:00 AM) With iron  oxymetazoline (AFRIN) 0.05 % nasal spray [id number]  Place 1 spray into both nostrils 2 (two) times daily.  Patient not taking: Reported on 07/02/2023   [provider]  Active            Med Note [name], [name] May 29, 2023  9:30 AM) As needed  spironolactone (ALDACTONE) 50 MG tablet [id number] Yes Take 1 tablet (50 mg total) by mouth daily. Alver Sorrow, NP Taking Active   valsartan (DIOVAN) 320 MG tablet [id number] Yes TAKE 1 TABLET BY MOUTH EVERY DAY [name], MD Taking Active               Assessment/Plan:   Diabetes: - Currently uncontrolled but improving - Reviewed long term cardiovascular and renal outcomes of uncontrolled blood sugar -  Reviewed goal A1c, goal fasting, and goal 2 hour post prandial glucose - Reviewed dietary modifications including low-carb diet - Reviewed lifestyle modifications including: achieving a total of of physical activity/week - Recommend to notify the office/myself IMMEDIATELY upon any delays or issues obtaining/taking medications  - Recommend to check glucose at least twice daily (fasting, post-prandial) with fasting sugars -Continue Victoza to 1.2mg , notify us if constipation worsens and does not stay resolved with consistent miralax use. -Counseled heavily on diet choices and sensor adherence  Hypertension: - Currently uncontrolled - Reviewed long term cardiovascular and renal outcomes of uncontrolled blood pressure - Reviewed appropriate blood pressure monitoring technique and reviewed goal blood pressure. Recommended to check home blood pressure and heart rate twice daily - Recommend to limit salt in diet! Try to avoid fast food or make low salt changes (salad vs fries) -Continue medications therapy and BP monitoring routine, counseled on med adherence   Follow Up Plan: 09/04/23   [name], PharmD Clinical Pharmacist [phone]

## 2023-08-04 NOTE — Procedures (Signed)
   [facility] [address] [place], [place] [address] Tel: [phone]   Fax: [phone]  Polysomnography Interpretation  Patient Name:  Victoria [name], Victoria [name] Date:  07/24/2023 Referring Physician:  Gillian Shields, NP  Indications for Polysomnography The patient is a 43 year old Female who is 5\' 4"  and weighs 281.0 lbs. Her BMI equals 48.6.  A full night polysomnogram was performed to evaluate for Obstructive Sleep Apnea.  Polysomnogram Data A full night polysomnogram recorded the standard physiologic parameters including EEG, EOG, EMG, EKG, nasal and oral airflow.  Respiratory parameters of chest and abdominal movements were recorded with Respiratory Inductance Plethysmography belts.  Oxygen saturation was recorded by pulse oximetry.   Sleep Architecture The total recording time of the polysomnogram was 427.8 minutes.  The total sleep time was 375.0 minutes.  The patient spent 6.1% of total sleep time in Stage N1, 60.4% in Stage N2, 0.0% in Stages N3, and 33.5% in REM.  Sleep latency was 2.8 minutes.  REM latency was 83.0 minutes.  Sleep Efficiency was 87.7%.  Wake after Sleep Onset time was 50.0 minutes.  Respiratory Events The polysomnogram revealed a presence of 3 obstructive, 1 central, and - mixed apneas resulting in an Apnea index of 0.6 events per hour.  There were 183 hypopneas (>=3% desaturation and/or arousal) resulting in an Apnea\Hypopnea Index (AHI >=3% desaturation and/or arousal) of 29.9 events per hour.  There were 123 hypopneas (>=4% desaturation) resulting in an Apnea\Hypopnea Index (AHI >=4% desaturation) of 20.3 events per hour.  There were 1 Respiratory Effort Related Arousals resulting in a RERA index of 0.2 events per hour. The Respiratory Disturbance Index is 30.1 events per hour.  The snore index was 85.1 events per hour.  Mean oxygen saturation was 93.8%.  The lowest oxygen saturation during sleep was 84.0%.  Time spent <=88%  oxygen saturation was 1.5 minutes (0.4%).  Limb Activity There were 0 total limb movements recorded, of this total, 0 were classified as PLMs.  PLM index was 0 per hour and PLM associated with Arousals index was 0 per hour.  Cardiac Summary The average pulse rate was 85.1 bpm.  The minimum pulse rate was 71.0 bpm while the maximum pulse rate was 111.0 bpm.  Cardiac rhythm was normal.  Diagnosis:  Moderate Obstructive Sleep Apnea  Recommendations: In lab CPAP titration for treatment of sleep disordered breathing. The patient should be counseled in practicing good sleep hygiene and avoid sleeping in the supine position. The patient should be counseled to avoid driving when sleeping.   This study was personally reviewed and electronically signed by: Armanda Magic, MD Accredited Board Certified in Sleep Medicine Date/Time:

## 2023-08-05 ENCOUNTER — Telehealth: Payer: Self-pay

## 2023-08-05 DIAGNOSIS — I1A0 Resistant hypertension: Secondary | ICD-10-CM

## 2023-08-05 DIAGNOSIS — G4733 Obstructive sleep apnea (adult) (pediatric): Secondary | ICD-10-CM

## 2023-08-05 NOTE — Telephone Encounter (Signed)
 Notified patient of sleep study results and recommendations. All questions were answered and patient verbalized understanding. CPAP Titration ordered today.

## 2023-08-05 NOTE — Telephone Encounter (Signed)
-----   Message from Armanda Magic sent at 08/04/2023  4:35 PM EDT ----- Please let patient know that they have sleep apnea.  Recommend therapeutic CPAP titration for treatment of patient's sleep disordered breathing.

## 2023-08-15 ENCOUNTER — Telehealth: Admitting: Family Medicine

## 2023-08-15 DIAGNOSIS — R6889 Other general symptoms and signs: Secondary | ICD-10-CM

## 2023-08-15 MED ORDER — OSELTAMIVIR PHOSPHATE 75 MG PO CAPS: 75.0000 mg | ORAL_CAPSULE | Freq: Two times a day (BID) | ORAL | 0 refills | Status: AC

## 2023-08-15 NOTE — Progress Notes (Signed)
 E visit for Flu like symptoms   We are sorry that you are not feeling well.  Here is how we plan to help! Based on what you have shared with me it looks like you may have influenza.   Influenza or "the flu" is   an infection caused by a respiratory virus. The flu virus is highly contagious and persons who did not receive their yearly flu vaccination may "catch" the flu from close contact.  We have anti-viral medications to treat the viruses that cause this infection. They are not a "cure" and only shorten the course of the infection. These prescriptions are most effective when they are given within the first 2 days of "flu" symptoms. Antiviral medication are indicated if you have a high risk of complications from the flu. You should  also consider an antiviral medication if you are in close contact with someone who is at risk. These medications can help patients avoid complications from the flu  but have side effects that you should know. Possible side effects from Tamiflu or oseltamivir include nausea, vomiting, diarrhea, dizziness, headaches, eye redness, sleep problems or other respiratory symptoms. You should not take Tamiflu if you have an allergy to oseltamivir or any to the ingredients in Tamiflu.  Based upon your symptoms and potential risk factors I have prescribed Oseltamivir (Tamiflu).  It has been sent to your designated pharmacy.  You will take one 75 mg capsule orally twice a day for the next 5 days.  ANYONE WHO HAS FLU SYMPTOMS SHOULD: Stay home. The flu is highly contagious and going out or to work exposes others! Be sure to drink plenty of fluids. Water is fine as well as fruit juices, sodas and electrolyte beverages. You may want to stay away from caffeine or alcohol. If you are nauseated, try taking small sips of liquids. How do you know if you are getting enough fluid? Your urine should be a pale yellow or almost colorless. Get rest. Taking a steamy shower or using a humidifier  may help nasal congestion and ease sore throat pain. Using a saline nasal spray works much the same way. Cough drops, hard candies and sore throat lozenges may ease your cough. Line up a caregiver. Have someone check on you regularly.   GET HELP RIGHT AWAY IF: You cannot keep down liquids or your medications. You become short of breath Your fell like you are going to pass out or loose consciousness. Your symptoms persist after you have completed your treatment plan MAKE SURE YOU  Understand these instructions. Will watch your condition. Will get help right away if you are not doing well or get worse.  Your e-visit answers were reviewed by a board certified advanced clinical practitioner to complete your personal care plan.  Depending on the condition, your plan could have included both over the counter or prescription medications.  If there is a problem please reply  once you have received a response from your provider.  Your safety is important to [place].  If you have drug allergies check your prescription carefully.    You can use MyChart to ask questions about today's visit, request a non-urgent call back, or ask for a work or school excuse for 24 hours related to this e-Visit. If it has been greater than 24 hours you will need to follow up with your provider, or enter a new e-Visit to address those concerns.  You will get an e-mail in the next two days asking about your  experience.  I hope that your e-visit has been valuable and will speed your recovery. Thank you for using e-visits.    have provided 5 minutes of non face to face time during this encounter for chart review and documentation.

## 2023-08-28 ENCOUNTER — Encounter: Payer: 59 | Admitting: Family Medicine

## 2023-08-29 ENCOUNTER — Other Ambulatory Visit: Payer: Self-pay | Admitting: Family Medicine

## 2023-08-29 DIAGNOSIS — I10 Essential (primary) hypertension: Secondary | ICD-10-CM

## 2023-08-29 NOTE — Telephone Encounter (Signed)
 Has an appt mid June

## 2023-09-04 ENCOUNTER — Other Ambulatory Visit (INDEPENDENT_AMBULATORY_CARE_PROVIDER_SITE_OTHER)

## 2023-09-04 VITALS — BP 135/85

## 2023-09-04 DIAGNOSIS — I10 Essential (primary) hypertension: Secondary | ICD-10-CM

## 2023-09-04 DIAGNOSIS — E118 Type 2 diabetes mellitus with unspecified complications: Secondary | ICD-10-CM

## 2023-09-04 MED ORDER — ACCU-CHEK GUIDE ME W/DEVICE KIT: PACK | 0 refills | Status: AC

## 2023-09-04 MED ORDER — ACCU-CHEK GUIDE TEST VI STRP: ORAL_STRIP | 3 refills | Status: AC

## 2023-09-04 MED ORDER — ACCU-CHEK SOFTCLIX LANCETS MISC: 3 refills | Status: AC

## 2023-09-04 NOTE — Progress Notes (Signed)
 09/04/2023 Name: Victoria [name] MRN: [id number] DOB: January 18, 1981  Chief Complaint  Patient presents with   Medication Management   Hypertension   Diabetes    Victoria [name] is a 43 y.o. year old female who presented for a telephone visit.   They were referred to the pharmacist by their PCP for assistance in managing complex medication management.    Subjective:  Care Team: Primary Care Provider: [name], MD ; Next Scheduled Visit: 11/17/23  Medication Access/Adherence  Current Pharmacy:  [id number] - [place], [place] - [address] AT [facility] OF [address] [place] [address] Phone: [phone] Fax: [phone]  [id number] - [place] - [address] [place] [address] Phone: [phone] Fax: [phone]  [facility] [address] [place] [address] Phone: [phone] Fax: [phone]   Patient reports affordability concerns with their medications: No Patient reports access/transportation concerns to their pharmacy: No  Patient reports adherence concerns with their medications:  No   Diabetes:  Current medications: Metformin 1000mg  BID, Victoza 1.2mg  sq daily  Medications tried in the past: Rybelsus (cost-not covered by ins), Farxiga/Jardiance (yeast issues), Bydureon (not covered), Januvia (stopped due to cost concern in the past), Did not tolerate dose increase of Victoza to 1.8mg  (severe nausea, could not keep food down, belching).  Current Glucose Readings:  Insurance has stopped covering the sensors because patient does not use insulin. Patient has resorted back to using a finger prick meter as of yesterday.  Meter/Supplies: Relion Platinum Meter/Relion Platinum Test Strips Checking once daily fasting: 159 this am, 165 yesterday am  Reports elevated readings due to having cake  the last 2 days for her son's birthday   Patient denies hypoglycemic s/sx including dizziness, shakiness, sweating. Patient denies hyperglycemic symptoms including polyuria, polydipsia, polyphagia, nocturia, neuropathy, blurred vision.  Current meal patterns:  Has been referred to a nutritionist and is working on incorporating their recommendations of limited fried/fatty foods and being mindful of low-carb meal options. 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 for most of it staying active  Reports some constipation with Victoza but is managing with OTC miralax/stool softener/staying hydrated  Current medication access support: None  Out of Victoza as new insurance is requiring a prior authorization  Hypertension:  Current medications: Valsartan 320mg  daily, Spironolactone 50mg  daily, Carvedilol 25mg  BID Medications previously tried: Amlodipine, Chlorthalidone, hydrochlorothiazide, Labetalol, Losartan, Methyldopa, Nifedipine  Patient has a validated, automated, upper arm home BP cuff -135/85 today  Patient denies hypotensive s/sx including dizziness, lightheadedness.  Patient denies hypertensive symptoms including headache, chest pain, shortness of breath  Current meal patterns: Trying to be mindful of sodium  Current physical activity: see above    Objective:  Lab Results  Component Value Date   HGBA1C 7.8 (A) 05/29/2023    Lab Results  Component Value Date   CREATININE 0.74 05/29/2023   BUN 10 05/29/2023   NA 137 05/29/2023   K 4.8 05/29/2023   CL 104 05/29/2023   CO2 20 05/29/2023    Lab Results  Component Value Date   CHOL 141 05/29/2023   HDL 52 05/29/2023   LDLCALC 67 05/29/2023   TRIG 124 05/29/2023   CHOLHDL 2.7  05/29/2023    Medications Reviewed Today     Reviewed by [name],  RPH (Pharmacist) on 09/04/23 at 1445  Med List Status: <None>   Medication Order Taking? Sig Documenting Provider Last Dose Status Informant  acetaminophen (TYLENOL) 500 MG tablet [id number] Yes Take 1,000 mg by mouth as needed for moderate pain (pain score 4-6). [provider] Taking Active            Med Note [name], [name] May 29, 2023  9:29 AM) As needed  albuterol (PROVENTIL) (2.5 MG/3ML) 0.083% nebulizer solution [id number] Yes Take 3 mLs (2.5 mg total) by nebulization every 6 (six) hours as needed for wheezing or shortness of breath. [name], MD Taking Active   albuterol (VENTOLIN HFA) 108 (90 Base) MCG/ACT inhaler [id number] Yes Inhale 2 puffs into the lungs every 6 (six) hours as needed for wheezing or shortness of breath. [name], MD Taking Active   atorvastatin (LIPITOR) 20 MG tablet [id number] Yes Take 1 tablet (20 mg total) by mouth daily. [name], MD Taking Active   carvedilol (COREG) 25 MG tablet [id number] Yes TAKE 1 TABLET BY MOUTH TWICE A DAY Tysinger, Kermit Balo, PA-C Taking Active   Cholecalciferol (VITAMIN D) 50 MCG (2000 UT) CAPS [id number] Yes Take 2 capsules by mouth daily. [provider] Taking Active Self           Med Note [name]   Mon Oct 23, 2020  4:34 AM)    Continuous Glucose Sensor (FREESTYLE LIBRE 3 PLUS SENSOR) [place] [id number] Yes 1 each by Does not apply route every 14 (fourteen) days. Change sensor every 15 days. [name], MD Taking Active   fluticasone [facility]) 50 MCG/ACT nasal spray [id number] Yes Place 2 sprays into both nostrils daily. [name], MD Taking Active   ibuprofen (ADVIL) 600 MG tablet [id number] Yes Take 1 tablet (600 mg total) by mouth every 6 (six) hours as needed. [name], PA Taking Active            Med Note [name], [name]   Thu May 29, 2023  9:30 AM) As needed  Insulin Pen Needle 32G X 4 MM MISC [id number]  1 Needle by Does not apply route daily. [name], NP  Active    levocetirizine (XYZAL) 5 MG tablet [id number] Yes Take 1 tablet (5 mg total) by mouth every evening. [name], MD Taking Active   liraglutide (VICTOZA) 18 MG/3ML SOPN [id number] Yes Inject 0.6 mg subcutaneously daily for 1 week, then 1.2 mg [place] daily.  Can further increase to 1.8 mg [place] daily if needed based on blood sugars [name], MD Taking Active            Med Note [name]   Tue Jul 01, 2023  9:58 AM) Jovita Gamma herself 1.8mg  x 2 weeks, was not able to tolerate, went back to 1.2mg   loratadine (CLARITIN) 10 MG tablet [id number] Yes Take 10 mg by mouth daily. [provider] Taking Active            Med Note [name], [name] May 29, 2023  9:30 AM) As needed  metFORMIN (GLUCOPHAGE) 1000 MG tablet [id number] Yes Take 1 tablet (1,000 mg total) by mouth 2 (two) times daily with a meal. [name], MD Taking Active   methocarbamol (ROBAXIN) 500 MG tablet [id number] Yes Take 1-2 tablets (500-1,000 mg total) by mouth every 8 (eight)  hours as needed for muscle spasms. [name], MD Taking Active            Med Note [place], [name]   Thu May 29, 2023  9:30 AM) As needed, rarely  Mometasone Furoate [facility] HFA) 50 MCG/ACT AERO [id number]  INHALE 2 PUFFS INTO THE LUNGS IN THE MORNING AND AT BEDTIME. [name], MD  Active   Multiple Vitamins-Minerals (ONE A DAY WOMEN 50 PLUS PO) [id number] Yes Take 1 tablet by mouth daily. [provider] Taking Active            Med Note [name], Corliss Blacker Apr 17, 2022 11:00 AM) With iron  spironolactone (ALDACTONE) 50 MG tablet [id number] Yes Take 1 tablet (50 mg total) by mouth daily. Alver Sorrow, NP Taking Active   valsartan (DIOVAN) 320 MG tablet [id number] Yes TAKE 1 TABLET BY MOUTH EVERY DAY [name], MD Taking Active               Assessment/Plan:   Diabetes: - Currently uncontrolled but improving - Reviewed long term cardiovascular and renal outcomes of uncontrolled blood sugar - Reviewed goal A1c,  goal fasting, and goal 2 hour post prandial glucose - Reviewed dietary modifications including low-carb diet - Reviewed lifestyle modifications including: achieving a total of of physical activity/week - Recommend to notify the office/myself IMMEDIATELY upon any delays or issues obtaining/taking medications  - Recommend to check glucose at least once daily  -Continue Victoza to 1.2mg , notify us if constipation worsens and does not stay resolved with consistent miralax use. -Counseled heavily on diet choices -Sending in new rx for meter and supplies that will be covered by patient's insurance (she lost her primary insurance that had previously been covering) -Prior auth for victoza approved, patient filling today  Hypertension: - Currently uncontrolled - Reviewed long term cardiovascular and renal outcomes of uncontrolled blood pressure - Reviewed appropriate blood pressure monitoring technique and reviewed goal blood pressure. Recommended to check home blood pressure and heart rate twice daily - Recommend to limit salt in diet! Try to avoid fast food or make low salt changes (salad vs fries) -Continue medications therapy and BP monitoring routine, counseled on med adherence   Follow Up Plan: 10/02/23   [name], PharmD Clinical Pharmacist [phone]

## 2023-09-30 ENCOUNTER — Encounter (HOSPITAL_BASED_OUTPATIENT_CLINIC_OR_DEPARTMENT_OTHER): Payer: Self-pay | Admitting: Emergency Medicine

## 2023-09-30 ENCOUNTER — Emergency Department (HOSPITAL_BASED_OUTPATIENT_CLINIC_OR_DEPARTMENT_OTHER)
Admission: EM | Admit: 2023-09-30 | Discharge: 2023-09-30 | Disposition: A | Discharge status: Home / Self Care | Attending: Emergency Medicine | Admitting: Emergency Medicine

## 2023-09-30 ENCOUNTER — Other Ambulatory Visit: Payer: Self-pay

## 2023-09-30 DIAGNOSIS — I10 Essential (primary) hypertension: Secondary | ICD-10-CM | POA: Insufficient documentation

## 2023-09-30 DIAGNOSIS — E119 Type 2 diabetes mellitus without complications: Secondary | ICD-10-CM | POA: Insufficient documentation

## 2023-09-30 DIAGNOSIS — R42 Dizziness and giddiness: Secondary | ICD-10-CM | POA: Diagnosis not present

## 2023-09-30 DIAGNOSIS — R519 Headache, unspecified: Secondary | ICD-10-CM | POA: Diagnosis not present

## 2023-09-30 DIAGNOSIS — Z91010 Allergy to peanuts: Secondary | ICD-10-CM | POA: Insufficient documentation

## 2023-09-30 DIAGNOSIS — H6991 Unspecified Eustachian tube disorder, right ear: Secondary | ICD-10-CM | POA: Insufficient documentation

## 2023-09-30 DIAGNOSIS — Z7984 Long term (current) use of oral hypoglycemic drugs: Secondary | ICD-10-CM | POA: Insufficient documentation

## 2023-09-30 DIAGNOSIS — Z794 Long term (current) use of insulin: Secondary | ICD-10-CM | POA: Insufficient documentation

## 2023-09-30 DIAGNOSIS — Z79899 Other long term (current) drug therapy: Secondary | ICD-10-CM | POA: Insufficient documentation

## 2023-09-30 LAB — BASIC METABOLIC PANEL WITH GFR
Anion gap: 12 (ref 5–15)
BUN: 9 mg/dL (ref 6–20)
CO2: 23 mmol/L (ref 22–32)
Calcium: 9.9 mg/dL (ref 8.9–10.3)
Chloride: 100 mmol/L (ref 98–111)
Creatinine, Ser: 0.72 mg/dL (ref 0.44–1.00)
GFR, Estimated: >60 mL/min (ref >60)
Glucose, Bld: 276 mg/dL — ABNORMAL HIGH (ref 70–99)
Potassium: 4.1 mmol/L (ref 3.5–5.1)
Sodium: 134 mmol/L — ABNORMAL LOW (ref 135–145)

## 2023-09-30 LAB — CBC WITH DIFFERENTIAL/PLATELET
Abs Immature Granulocytes: 0.01 10*3/uL (ref 0.00–0.07)
Basophils Absolute: 0 10*3/uL (ref 0.0–0.1)
Basophils Relative: 0 %
Eosinophils Absolute: 0.1 10*3/uL (ref 0.0–0.5)
Eosinophils Relative: 1 %
HCT: 33.7 % — ABNORMAL LOW (ref 36.0–46.0)
Hemoglobin: 11.3 g/dL — ABNORMAL LOW (ref 12.0–15.0)
Immature Granulocytes: 0 %
Lymphocytes Relative: 32 %
Lymphs Abs: 2.2 10*3/uL (ref 0.7–4.0)
MCH: 26.5 pg (ref 26.0–34.0)
MCHC: 33.5 g/dL (ref 30.0–36.0)
MCV: 79.1 fL — ABNORMAL LOW (ref 80.0–100.0)
Monocytes Absolute: 0.5 10*3/uL (ref 0.1–1.0)
Monocytes Relative: 7 %
Neutro Abs: 4.2 10*3/uL (ref 1.7–7.7)
Neutrophils Relative %: 60 %
Platelets: 286 10*3/uL (ref 150–400)
RBC: 4.26 MIL/uL (ref 3.87–5.11)
RDW: 14 % (ref 11.5–15.5)
WBC: 7.1 10*3/uL (ref 4.0–10.5)
nRBC: 0 % (ref 0.0–0.2)

## 2023-09-30 LAB — CBG MONITORING, ED: Glucose-Capillary: 267 mg/dL — ABNORMAL HIGH (ref 70–99)

## 2023-09-30 MED ORDER — SODIUM CHLORIDE 0.9 % IV BOLUS
1000.0000 mL | Freq: Once | INTRAVENOUS | Status: AC
  Administered 2023-09-30: 1000 mL via INTRAVENOUS

## 2023-09-30 MED ORDER — MECLIZINE HCL 25 MG PO TABS: 25.0000 mg | ORAL_TABLET | Freq: Three times a day (TID) | ORAL | 0 refills | Status: AC | PRN

## 2023-09-30 MED ORDER — DIPHENHYDRAMINE HCL 50 MG/ML IJ SOLN
25.0000 mg | Freq: Once | INTRAMUSCULAR | Status: AC
  Administered 2023-09-30: 25 mg via INTRAVENOUS
  Filled 2023-09-30: qty 1

## 2023-09-30 MED ORDER — METOCLOPRAMIDE HCL 5 MG/ML IJ SOLN
10.0000 mg | Freq: Once | INTRAMUSCULAR | Status: AC
  Administered 2023-09-30: 10 mg via INTRAVENOUS
  Filled 2023-09-30: qty 2

## 2023-09-30 NOTE — ED Triage Notes (Signed)
 C/o headache and dizziness since last week. States BG was in the 300s and have been out of metformin .

## 2023-09-30 NOTE — Discharge Instructions (Addendum)
 Please read and follow all provided instructions.  Your diagnoses today include:  1. Acute nonintractable headache, unspecified headache type   2. Dizziness     Tests performed today include: Complete blood cell count: She has mild anemia, unchanged from previous Basic metabolic panel: Elevated blood sugar but no emergent problems from this Vital signs. See below for your results today.   Medications:  In the Emergency Department you received: Reglan  - antinausea/headache medication Benadryl  - antihistamine to counteract potential side effects of reglan   Prescribed medication: Meclizine : Medication that can be used for dizziness  Take any prescribed medications only as directed.  Home information: You have been prescribed Flonase  and oral antihistamine such as Xyzal  and Claritin in the past.  Please continue these medications as they may help unblock and equalize the pressure in your ear.  Additional information:  Follow any educational materials contained in this packet.  You are having a headache. No specific cause was found today for your headache. It may have been a migraine or other cause of headache. Stress, anxiety, fatigue, and depression are common triggers for headaches.   Your headache today does not appear to be life-threatening or require hospitalization, but often the exact cause of headaches is not determined in the emergency department. Therefore, follow-up with your doctor is very important to find out what may have caused your headache and whether or not you need any further diagnostic testing or treatment.   Sometimes headaches can appear benign (not harmful), but then more serious symptoms can develop which should prompt an immediate re-evaluation by your doctor or the emergency department.  BE VERY CAREFUL not to take multiple medicines containing Tylenol  (also called acetaminophen ). Doing so can lead to an overdose which can damage your liver and cause liver  failure and possibly death.   Follow-up instructions: Please follow-up with your primary care provider in the next 7 days for further evaluation of your symptoms.   Return instructions:  Please return to the Emergency Department if you experience worsening symptoms. Return if the medications do not resolve your headache, if it recurs, or if you have multiple episodes of vomiting or cannot keep down fluids. Return if you have a change from the usual headache. RETURN IMMEDIATELY IF you: Develop a sudden, severe headache Develop confusion or become poorly responsive or faint Develop a fever above 100.71F or problem breathing Have a change in speech, vision, swallowing, or understanding Develop new weakness, numbness, tingling, incoordination in your arms or legs Have a seizure Please return if you have any other emergent concerns.  Additional Information:  Your vital signs today were: BP (!) 139/107   Pulse 61   Temp 98.3 F (36.8 C) (Oral)   Resp (!) 28   SpO2 100%  If your blood pressure (BP) was elevated above 135/85 this visit, please have this repeated by your doctor within one month. --------------

## 2023-09-30 NOTE — ED Provider Notes (Signed)
 [place] EMERGENCY DEPARTMENT AT [facility] Provider Note   CSN: [id number] Arrival date & time: 09/30/23  4098     History  Chief Complaint  Patient presents with   Dizziness    Victoria [name] is a 43 y.o. female.  Patient with history of hypertension, diabetes --presents to the emergency department for evaluation of headache and dizziness.  Patient reports dizziness symptoms started 5 days ago.  She describes brief spinning sensation if she turns her head suddenly.  3 days ago she developed a generalized headache.  No vomiting or confusion.  No fevers.  She has had some right ear pain as well.  She has been out of metformin  recently for about a week, restarted 2 days ago.  She does not currently have her Victoza.  She reports similar headaches in the past when her blood pressures have been high, however recently normal BP.  No fevers.  No nasal congestion or sore throat. Patient denies signs of stroke including: facial droop, slurred speech, aphasia, weakness/numbness in extremities, imbalance/trouble walking.        Home Medications Prior to Admission medications   Medication Sig Start Date End Date Taking? Authorizing Provider  Accu-Chek Softclix Lancets lancets Use as directed to check blood sugars once daily 09/04/23   Roosvelt Colla, MD  acetaminophen  (TYLENOL ) 500 MG tablet Take 1,000 mg by mouth as needed for moderate pain (pain score 4-6).    [provider]  albuterol  (PROVENTIL ) (2.5 MG/3ML) 0.083% nebulizer solution Take 3 mLs (2.5 mg total) by nebulization every 6 (six) hours as needed for wheezing or shortness of breath. 07/02/23   Kandice Orleans, MD  albuterol  (VENTOLIN  HFA) 108 (90 Base) MCG/ACT inhaler Inhale 2 puffs into the lungs every 6 (six) hours as needed for wheezing or shortness of breath. 07/02/23   Kandice Orleans, MD  atorvastatin  (LIPITOR) 20 MG tablet Take 1 tablet (20 mg total) by mouth daily. 05/30/23   Roosvelt Colla, MD  Blood Glucose  Monitoring Suppl (ACCU-CHEK GUIDE ME) w/Device KIT Use as instructed to check blood sugar once daily 09/04/23   Roosvelt Colla, MD  carvedilol  (COREG ) 25 MG tablet TAKE 1 TABLET BY MOUTH TWICE A DAY 08/29/23   Tysinger, Christiane Cowing, PA-C  Cholecalciferol (VITAMIN D ) 50 MCG (2000 UT) CAPS Take 2 capsules by mouth daily.    [provider]  Continuous Glucose Sensor (FREESTYLE LIBRE 3 PLUS SENSOR) MISC 1 each by Does not apply route every 14 (fourteen) days. Change sensor every 15 days. 07/01/23   Roosvelt Colla, MD  fluticasone  (FLONASE ) 50 MCG/ACT nasal spray Place 2 sprays into both nostrils daily. 07/02/23   Kandice Orleans, MD  glucose blood (ACCU-CHEK GUIDE TEST) test strip Use as directed to check blood sugar once daily 09/04/23   Roosvelt Colla, MD  ibuprofen  (ADVIL ) 600 MG tablet Take 1 tablet (600 mg total) by mouth every 6 (six) hours as needed. 01/22/23   Neil Balls A, PA  Insulin  Pen Needle 32G X 4 MM MISC 1 Needle by Does not apply route daily. 03/05/23   Early, Sara E, NP  levocetirizine (XYZAL ) 5 MG tablet Take 1 tablet (5 mg total) by mouth every evening. 07/02/23   Kandice Orleans, MD  liraglutide (VICTOZA) 18 MG/3ML SOPN Inject 0.6 mg subcutaneously daily for 1 week, then 1.2 mg [place] daily.  Can further increase to 1.8 mg  daily if needed based on blood sugars 02/28/23   Roosvelt Colla, MD  loratadine (  CLARITIN) 10 MG tablet Take 10 mg by mouth daily.    [provider]  metFORMIN  (GLUCOPHAGE ) 1000 MG tablet Take 1 tablet (1,000 mg total) by mouth 2 (two) times daily with a meal. 05/29/23   Roosvelt Colla, MD  methocarbamol  (ROBAXIN ) 500 MG tablet Take 1-2 tablets (500-1,000 mg total) by mouth every 8 (eight) hours as needed for muscle spasms. 11/07/22   Roosvelt Colla, MD  Mometasone Furoate  (ASMANEX  HFA) 50 MCG/ACT AERO INHALE 2 PUFFS INTO THE LUNGS IN THE MORNING AND AT BEDTIME. 07/02/23   Kandice Orleans, MD  Multiple Vitamins-Minerals (ONE A DAY WOMEN 50 PLUS PO) Take 1 tablet by mouth daily.    [provider]  spironolactone  (ALDACTONE ) 50 MG tablet Take 1 tablet (50 mg total) by mouth daily. 02/27/23   Clearnce Curia, NP  valsartan  (DIOVAN ) 320 MG tablet TAKE 1 TABLET BY MOUTH EVERY DAY 07/09/23   Roosvelt Colla, MD      Allergies    Labetalol , Dilaudid [hydromorphone hcl], Morphine  and codeine, Peanut-containing drug products, and Strawberry extract    Review of Systems   Review of Systems  Physical Exam Updated Vital Signs BP 127/80   Pulse 62   Resp 18   SpO2 99%  Physical Exam Vitals and nursing note reviewed.  Constitutional:      General: She is not in acute distress.    Appearance: She is well-developed.  HENT:     Head: Normocephalic and atraumatic.     Right Ear: Ear canal and external ear normal. No tenderness. No middle ear effusion. Tympanic membrane is retracted. Tympanic membrane is not injected.     Left Ear: Tympanic membrane, ear canal and external ear normal. No tenderness.  No middle ear effusion. Tympanic membrane is not injected or retracted.     Ears:     Comments: R TM is very retracted. No significant erythema.     Nose: Nose normal.     Mouth/Throat:     Pharynx: Uvula midline.  Eyes:     General: Lids are normal.     Extraocular Movements:     Right eye: No nystagmus.     Left eye: No nystagmus.     Conjunctiva/sclera: Conjunctivae normal.     Pupils: Pupils are equal, round, and reactive to light.  Cardiovascular:     Rate and Rhythm: Normal rate and regular rhythm.     Heart sounds: No murmur heard. Pulmonary:     Effort: Pulmonary effort is normal. No respiratory distress.     Breath sounds: Normal breath sounds. No wheezing, rhonchi or rales.  Abdominal:     Palpations: Abdomen is soft.     Tenderness: There is no abdominal tenderness. There is no guarding or rebound.  Musculoskeletal:     Cervical back: Normal range of motion and neck supple. No tenderness or bony tenderness.     Right lower leg: No edema.     Left lower leg: No  edema.  Skin:    General: Skin is warm and dry.     Findings: No rash.  Neurological:     General: No focal deficit present.     Mental Status: She is alert and oriented to person, place, and time. Mental status is at baseline.     GCS: GCS eye subscore is 4. GCS verbal subscore is 5. GCS motor subscore is 6.     Cranial Nerves: No cranial nerve deficit.     Sensory: No  sensory deficit.     Motor: No weakness.     Coordination: Coordination normal.     Gait: Gait normal.     Comments: Upper extremity myotomes tested bilaterally:  C5 Shoulder abduction 5/5 C6 Elbow flexion/wrist extension 5/5 C7 Elbow extension 5/5 C8 Finger flexion 5/5 T1 Finger abduction 5/5  Lower extremity myotomes tested bilaterally: L2 Hip flexion 5/5 L3 Knee extension 5/5 L4 Ankle dorsiflexion 5/5 S1 Ankle plantar flexion 5/5   Psychiatric:        Mood and Affect: Mood normal.     ED Results / Procedures / Treatments   Labs (all labs ordered are listed, but only abnormal results are displayed) Labs Reviewed  CBC WITH DIFFERENTIAL/PLATELET - Abnormal; Notable for the following components:      Result Value   Hemoglobin 11.3 (*)    HCT 33.7 (*)    MCV 79.1 (*)    All other components within normal limits  BASIC METABOLIC PANEL WITH GFR - Abnormal; Notable for the following components:   Sodium 134 (*)    Glucose, Bld 276 (*)    All other components within normal limits  CBG MONITORING, ED - Abnormal; Notable for the following components:   Glucose-Capillary 267 (*)    All other components within normal limits    EKG None  Radiology No results found.  Procedures Procedures    Medications Ordered in ED Medications  metoCLOPramide  (REGLAN ) injection 10 mg (has no administration in time range)  diphenhydrAMINE  (BENADRYL ) injection 25 mg (has no administration in time range)  sodium chloride  0.9 % bolus 1,000 mL (has no administration in time range)    ED Course/ Medical Decision  Making/ A&P    Patient seen and examined. History obtained directly from patient.   Labs/EKG: Ordered CBC and BMP.  Imaging: None ordered  Medications/Fluids: Reglan , Benadryl , IV fluid bolus  Most recent vital signs reviewed and are as follows: BP 127/80   Pulse 62   Resp 18   SpO2 99%   Initial impression: Well-appearing patient with some positional vertigo and headache.  She also has retracted right TM which may be contributing to the dizziness and headache.  Overall, low concern for sinus infection given symptoms.  For headache: Will treat acute headache with Reglan  and Benadryl , do not feel that she requires imaging at this time.  Neuro exam is reassuring.  Considered Decadron , however would not likely be a good idea given history of DM.  For elevated blood sugar: Will check labs, give IV fluid bolus.  For ear pain: No evidence of otitis media currently or otitis externa.  Would trial antihistamine, also counseled patient on monitoring as symptoms will likely improve with time.  Do not feel antibiotics would be helpful at this time.  I have low concern for central stroke, vascular dissection in neck at this time.  Low concern for intracerebral hemorrhage as well.  Patient looks very well.  Will attempt to get her feeling better.  11:49 AM Reassessment performed. Patient appears improved.  States that headache is improved.  No current dizziness.  Labs personally reviewed and interpreted including: CBC with mild anemia, baseline; BMP glucose 276, slightly low sodium which corrects to normal, otherwise unremarkable.  Reviewed pertinent lab work and imaging with patient at bedside. Questions answered.   Most current vital signs reviewed and are as follows: BP (!) 139/107   Pulse 61   Temp 98.3 F (36.8 C) (Oral)   Resp (!) 28   SpO2  100%   Plan: Discharge to home.   Prescriptions written for: Meclizine  to use as needed for dizziness  Other home care instructions  discussed: Patient has already been prescribed Flonase  and oral antihistamines through PCP/allergist.  I have encouraged her to continue these.  ED return instructions discussed: Patient counseled to return if they have weakness in their arms or legs, slurred speech, trouble walking or talking, confusion, trouble with their balance, or if they have any other concerns. Patient verbalizes understanding and agrees with plan.   Follow-up instructions discussed: Patient encouraged to follow-up with their PCP in 7 days.                                  Medical Decision Making Amount and/or Complexity of Data Reviewed Labs: ordered.  Risk Prescription drug management.   In regards to the patient's headache, critical differentials were considered including subarachnoid hemorrhage, intracerebral hemorrhage, epidural/subdural hematoma, pituitary apoplexy, vertebral/carotid artery dissection, giant cell arteritis, central venous thrombosis, reversible cerebral vasoconstriction, acute angle closure glaucoma, idiopathic intracranial hypertension, bacterial meningitis, viral encephalitis, carbon monoxide poisoning, posterior reversible encephalopathy syndrome, pre-eclampsia.   Reg flag symptoms related to these causes were considered including systemic symptoms (fever, weight loss), neurologic symptoms (confusion, mental status change, vision change, associated seizure), acute or sudden "thunderclap" onset, patient age 58 or older with new or progressive headache, patient of any age with first headache or change in headache pattern, pregnant or postpartum status, history of HIV or other immunocompromise, history of cancer, headache occurring with exertion, associated neck or shoulder pain, associated traumatic injury, concurrent use of anticoagulation, family history of spontaneous SAH, and concurrent drug use.    Other benign, more common causes of headache were considered including migraine, tension-type  headache, cluster headache, referred pain from other cause such as sinus infection, dental pain, trigeminal neuralgia.   On exam, patient has a reassuring neuro exam including baseline mental status, no significant neck pain or meningeal signs, no signs of severe infection or fever.   Eustachian tube dysfunction/vertigo: Patient has a retracted right TM.  This is likely contributing to peripheral vertigo symptoms which are very intermittent nature, worse with movement.  Overall low concern for posterior circulation stroke as symptoms are not constant.  Will focus on continued antihistamines and treatment for eustachian tube dysfunction.  Hyperglycemia: Treated with IV fluids, no evidence of DKA or other emergent consequences.  The patient's vital signs, pertinent lab work and imaging were reviewed and interpreted as discussed in the ED course. Hospitalization was considered for further testing, treatments, or serial exams/observation. However as patient is well-appearing, has a stable exam over the course of their evaluation, and reassuring studies today, I do not feel that they warrant admission at this time. This plan was discussed with the patient who verbalizes agreement and comfort with this plan and seems reliable and able to return to the Emergency Department with worsening or changing symptoms.          Final Clinical Impression(s) / ED Diagnoses Final diagnoses:  Acute nonintractable headache, unspecified headache type  Dizziness  Dysfunction of right eustachian tube    Rx / DC Orders ED Discharge Orders          Ordered    meclizine  (ANTIVERT ) 25 MG tablet  3 times daily PRN        09/30/23 1146  Lyna Sandhoff, PA-C 09/30/23 1151    Afton Horse T, DO 10/01/23 [phone]

## 2023-10-02 ENCOUNTER — Other Ambulatory Visit (INDEPENDENT_AMBULATORY_CARE_PROVIDER_SITE_OTHER)

## 2023-10-02 DIAGNOSIS — E118 Type 2 diabetes mellitus with unspecified complications: Secondary | ICD-10-CM

## 2023-10-02 NOTE — Progress Notes (Signed)
   10/02/2023  Patient ID: Victoria [name], female   DOB: 03-07-1981, 43 y.o.   MRN: [id number]  Contacted patient to follow up on diabetes management.  Recent ED visit noted patient had been without her victoza and metformin . Reports sugars have been in the 200s without victoza.  Patient confirms she has metformin  and is taking. Is without victoza because she states pharmacy is trying to charge her over $400. Had spoken with her pharmacy last month and confirmed med needed to be filled for brand name and went through for a copay of $4.   Contacted pharmacy today and had to remind pharmacy to fill for brand for patient's new insurance. They are out of and stock and will order in for tomorrow. Made patient aware.  Scheduled f/u for 1 week with patient (soonest available for her schedule) to ensure she receives medication this time.  Carnell Christian, PharmD Clinical Pharmacist [phone]

## 2023-10-03 ENCOUNTER — Telehealth: Payer: Self-pay

## 2023-10-03 ENCOUNTER — Other Ambulatory Visit (HOSPITAL_COMMUNITY): Payer: Self-pay

## 2023-10-03 NOTE — Telephone Encounter (Signed)
 Pharmacy Patient Advocate Encounter   Received notification from CoverMyMeds that prior authorization for Liraglutide 18MG /3ML pen-injectors is required/requested.   Insurance verification completed.   The patient is insured through [facility] MEDICAID .   Per test claim: PA required; PA submitted to above mentioned insurance via CoverMyMeds Key/confirmation #/EOC (Key: [id number])   Status is pending

## 2023-10-06 ENCOUNTER — Other Ambulatory Visit (HOSPITAL_COMMUNITY): Payer: Self-pay

## 2023-10-06 NOTE — Telephone Encounter (Signed)
 Pharmacy Patient Advocate Encounter  Received notification from [employer] that Prior Authorization for Liraglutide 18MG /3ML pen-injectors has been CANCELLED due to       PA #/Case ID/Reference #: [id number]

## 2023-10-09 ENCOUNTER — Other Ambulatory Visit

## 2023-10-10 ENCOUNTER — Other Ambulatory Visit (INDEPENDENT_AMBULATORY_CARE_PROVIDER_SITE_OTHER)

## 2023-10-10 DIAGNOSIS — E118 Type 2 diabetes mellitus with unspecified complications: Secondary | ICD-10-CM

## 2023-10-10 NOTE — Progress Notes (Signed)
 10/10/2023 Name: Victoria [name] MRN: [id number] DOB: 09/25/1980  Chief Complaint  Patient presents with   Diabetes   Medication Management    Victoria [name] is a 43 y.o. year old female who presented for a telephone visit.   They were referred to the pharmacist by their PCP for assistance in managing complex medication management.    Subjective:  Care Team: Primary Care Provider: Roosvelt Colla, MD ; Next Scheduled Visit: 11/17/23  Medication Access/Adherence  Current Pharmacy:  [id number] - [place], [place] - [address] AT [facility] [address] [place] [address] Phone: [phone] Fax: [phone]  [id number] - [place] - [address] [place] [address] Phone: [phone] Fax: [phone]  [facility] [address] [place] [address] Phone: [phone] Fax: [phone]   Patient reports affordability concerns with their medications: No Patient reports access/transportation concerns to their pharmacy: No  Patient reports adherence concerns with their medications:  No   Diabetes:  Current medications: Metformin  1000mg  BID, Victoza 1.2mg  sq daily  Medications tried in the past: Rybelsus  (cost-not covered by ins), Farxiga Victoria [name] (yeast issues), Bydureon  (not covered), Januvia  (stopped due to cost concern in the past), Did not tolerate dose increase of Victoza to 1.8mg  (severe nausea, could not keep food down, belching).  Current Glucose Readings:  Insurance has stopped covering the sensors because patient does not use insulin . Patient has resorted back to using a finger prick meter   Out of supplies and has not checked the last 1-2 weeks, plans to pick up supplies from pharmacy today as she is off work today  Patient denies hypoglycemic s/sx including dizziness, shakiness, sweating.  Patient denies hyperglycemic symptoms including polyuria, polydipsia, polyphagia, nocturia, neuropathy, blurred vision.   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 Now only has Victoria [name] Medicaid and they will only cover brand victoza (on backorder, cannot find at any pharmacy)  Insurance denied prior auth for generic victoza but approved previously denied Ozempic    Objective:  Lab Results  Component Value Date   HGBA1C 7.8 (A) 05/29/2023    Lab Results  Component Value Date   CREATININE 0.72 09/30/2023   BUN 9 09/30/2023   NA 134 (L) 09/30/2023   K 4.1 09/30/2023   CL 100 09/30/2023   CO2 23 09/30/2023    Lab Results  Component Value Date   CHOL 141 05/29/2023   HDL 52 05/29/2023   LDLCALC 67 05/29/2023   TRIG 124 05/29/2023   CHOLHDL 2.7 05/29/2023    Medications Reviewed Today   Medications were not reviewed in this encounter       Assessment/Plan:   Diabetes: - Currently uncontrolled  - Reviewed long term cardiovascular and renal outcomes of uncontrolled blood sugar - Reviewed goal A1c, goal fasting, and goal 2 hour post prandial glucose - Reviewed dietary modifications including low-carb diet - Reviewed lifestyle modifications including: achieving a total of of physical activity/week - Recommend to notify the office/myself IMMEDIATELY upon any delays or issues obtaining/taking medications  - Recommend to check glucose at least once daily  -Can no longer get liraglutide. Ozempic  now covered by insurance at $4 copay (previously denied by insurance when PCP tried to start med). -Providing one box of samples, START Ozempic   0.25mg  once weekly, will coordinate rx with PCP if patient tolerates   Follow Up Plan: 3 weeks   Victoria [name], PharmD Clinical Pharmacist [phone]

## 2023-10-25 ENCOUNTER — Other Ambulatory Visit: Payer: Self-pay | Admitting: Family Medicine

## 2023-10-25 ENCOUNTER — Other Ambulatory Visit (HOSPITAL_BASED_OUTPATIENT_CLINIC_OR_DEPARTMENT_OTHER): Payer: Self-pay | Admitting: Family

## 2023-10-25 DIAGNOSIS — I1A0 Resistant hypertension: Secondary | ICD-10-CM

## 2023-10-25 DIAGNOSIS — I10 Essential (primary) hypertension: Secondary | ICD-10-CM

## 2023-10-30 ENCOUNTER — Other Ambulatory Visit

## 2023-11-07 ENCOUNTER — Other Ambulatory Visit: Payer: Self-pay

## 2023-11-07 DIAGNOSIS — E1129 Type 2 diabetes mellitus with other diabetic kidney complication: Secondary | ICD-10-CM

## 2023-11-07 NOTE — Progress Notes (Signed)
 11/07/2023 Name: Victoria [name] MRN: [id number] DOB: [birth year]  Chief Complaint  Patient presents with   Medication Management   Diabetes    Victoria [name] is a 43 y.o. year old female who presented for a telephone visit.   They were referred to the pharmacist by their PCP for assistance in managing complex medication management.    Subjective:  Care Team: Primary Care Provider: Roosvelt Colla, MD ; Next Scheduled Visit: 11/17/23  Medication Access/Adherence  Current Pharmacy:  [id number] - [place], [place] - [address] AT [facility] [address] [place] [address] Phone: [phone] Fax: [phone]  [id number] - [place] - [address] [place] [address] Phone: [phone] Fax: [phone]  [facility] [address] [place] [address] Phone: [phone] Fax: [phone]   Patient reports affordability concerns with their medications: No Patient reports access/transportation concerns to their pharmacy: No  Patient reports adherence concerns with their medications:  No   Diabetes:  Current medications: Metformin  1000mg  BID, Ozempic  0.25mg  via samples once weekly Medications tried in the past: Rybelsus  (cost-not covered by ins), Farxiga [name] (yeast issues), Bydureon  (not covered), Januvia  (stopped due to cost concern in the past), Did not tolerate dose increase of Victoza to 1.8mg  (severe nausea, could not keep food down, belching).  Current Glucose Readings:  Insurance has stopped covering the sensors because patient does not use insulin . Patient has resorted back to using a finger prick meter   Out of supplies and has not checked sugars, never picked up new meter and supplies at pharmacy  Patient denies hypoglycemic s/sx including dizziness, shakiness, sweating. Patient denies  hyperglycemic symptoms including polyuria, polydipsia, polyphagia, nocturia, neuropathy, blurred vision.   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 for most of it staying active   Only gave herself one shot of Ozempic , reports she thought it was only given once monthly, despite instructions provided otherwise   Objective:  Lab Results  Component Value Date   HGBA1C 7.8 (A) 05/29/2023    Lab Results  Component Value Date   CREATININE 0.72 09/30/2023   BUN 9 09/30/2023   NA 134 (L) 09/30/2023   K 4.1 09/30/2023   CL 100 09/30/2023   CO2 23 09/30/2023    Lab Results  Component Value Date   CHOL 141 05/29/2023   HDL 52 05/29/2023   LDLCALC 67 05/29/2023   TRIG 124 05/29/2023   CHOLHDL 2.7 05/29/2023    Medications Reviewed Today     Reviewed by Carnell Christian, RPH (Pharmacist) on 11/07/23 at 1425  Med List Status: <None>   Medication Order Taking? Sig Documenting Provider Last Dose Status Informant  Accu-Chek Softclix Lancets lancets [id number]  Use as directed to check blood sugars once daily Roosvelt Colla, MD  Active   acetaminophen  (TYLENOL ) 500 MG tablet [id number] Yes Take 1,000 mg by mouth as needed for moderate pain (pain score 4-6). [provider]  Active            Med Note Felipe Horton, Rod Circle May 29, 2023  9:29 AM) As needed  albuterol  (PROVENTIL ) (2.5 MG/3ML) 0.083% nebulizer solution [id number] Yes Take 3 mLs (2.5 mg total) by nebulization every 6 (six) hours as needed for wheezing or shortness of breath. Kandice Orleans, MD  Active  albuterol  (VENTOLIN  HFA) 108 (90 Base) MCG/ACT inhaler [id number] Yes Inhale 2 puffs into the lungs every 6 (six) hours as needed for wheezing or shortness of breath. Kandice Orleans, MD  Active   atorvastatin  (LIPITOR) 20 MG tablet [id number] Yes Take 1 tablet (20 mg total) by mouth daily. Roosvelt Colla, MD  Active   Blood Glucose Monitoring Suppl (ACCU-CHEK GUIDE ME)  w/Device KIT [id number]  Use as instructed to check blood sugar once daily Roosvelt Colla, MD  Active   carvedilol  (COREG ) 25 MG tablet [id number] Yes TAKE 1 TABLET BY MOUTH TWICE A DAY Tysinger, Christiane Cowing, PA-C  Active   Cholecalciferol (VITAMIN D ) 50 MCG (2000 UT) CAPS [id number] Yes Take 2 capsules by mouth daily. [provider]  Active Self           Med Note Shaaron Dar   Mon Oct 23, 2020  4:34 AM)    Continuous Glucose Sensor (FREESTYLE LIBRE 3 PLUS SENSOR) [place] [id number]  1 each by Does not apply route every 14 (fourteen) days. Change sensor every 15 days.  Patient not taking: Reported on 11/07/2023   Roosvelt Colla, MD  Active   fluticasone  (FLONASE ) 50 MCG/ACT nasal spray [id number]  Place 2 sprays into both nostrils daily. Kandice Orleans, MD  Active   glucose blood (ACCU-CHEK GUIDE TEST) test strip [id number]  Use as directed to check blood sugar once daily Roosvelt Colla, MD  Active   ibuprofen  (ADVIL ) 600 MG tablet [id number]  Take 1 tablet (600 mg total) by mouth every 6 (six) hours as needed. [place] Butter, PA  Active            Med Note Felipe Horton, [name]   Thu May 29, 2023  9:30 AM) As needed  Insulin  Pen Needle 32G X 4 MM MISC [id number]  1 Needle by Does not apply route daily. Early, Sara E, NP  Active   levocetirizine (XYZAL ) 5 MG tablet [id number]  Take 1 tablet (5 mg total) by mouth every evening. Kandice Orleans, MD  Active   loratadine (CLARITIN) 10 MG tablet [id number]  Take 10 mg by mouth daily. [provider]  Active            Med Note Felipe Horton, [name]   Thu May 29, 2023  9:30 AM) As needed  meclizine  (ANTIVERT ) 25 MG tablet [id number]  Take 1 tablet (25 mg total) by mouth 3 (three) times daily as needed for dizziness. Lyna Sandhoff, PA-C  Active   metFORMIN  (GLUCOPHAGE ) 1000 MG tablet [id number] Yes Take 1 tablet (1,000 mg total) by mouth 2 (two) times daily with a meal. Roosvelt Colla, MD  Active   methocarbamol  (ROBAXIN ) 500 MG tablet [id number]  Take 1-2 tablets  (500-1,000 mg total) by mouth every 8 (eight) hours as needed for muscle spasms. Roosvelt Colla, MD  Active            Med Note Edna, [place] F   Thu May 29, 2023  9:30 AM) As needed, rarely  Mometasone Furoate  (ASMANEX  HFA) 50 MCG/ACT AERO [id number]  INHALE 2 PUFFS INTO THE LUNGS IN THE MORNING AND AT BEDTIME. Kandice Orleans, MD  Active   Multiple Vitamins-Minerals (ONE A DAY WOMEN 50 PLUS PO) [id number]  Take 1 tablet by mouth daily. [provider]  Active            Med Note Felipe Horton, Georgeann Kindred Apr 17, 2022 11:00 AM) With iron   Semaglutide  (OZEMPIC ,  0.25 OR 0.5 MG/DOSE, [place]) [id number] Yes Inject 0.25 mg into the skin once a week. Inject 0.25mg  weekly x 4 weeks into the skin [provider]  Active   spironolactone  (ALDACTONE ) 50 MG tablet [id number] Yes TAKE 1 TABLET BY MOUTH EVERY DAY Walker, Caitlin S, NP  Active   valsartan  (DIOVAN ) 320 MG tablet [id number] Yes TAKE 1 TABLET BY MOUTH EVERY DAY Roosvelt Colla, MD  Active               Assessment/Plan:   Diabetes: - Currently uncontrolled  - Reviewed long term cardiovascular and renal outcomes of uncontrolled blood sugar - Reviewed goal A1c, goal fasting, and goal 2 hour post prandial glucose - Reviewed dietary modifications including low-carb diet - Reviewed lifestyle modifications including: achieving a total of of physical activity/week - Recommend to notify the office/myself IMMEDIATELY upon any delays or issues obtaining/taking medications  - Recommend to check glucose at least once daily  -Resume Ozempic  0.25mg  once WEEKLY. Counseled on importance of home monitoring for BG and adherence to medication, despite being busy in life as this can only lead to poor outcomes if neglecting health. Reminded of PCP appt, if A1c checked, will not be surprised if A1c still elevated given all the med nonadherence recently and insurance issues. This should all be resolved now   Follow Up Plan: 3 weeks   Carnell Christian, PharmD Clinical Pharmacist [phone]

## 2023-11-11 ENCOUNTER — Other Ambulatory Visit

## 2023-11-12 ENCOUNTER — Telehealth: Payer: Self-pay

## 2023-11-12 NOTE — Telephone Encounter (Signed)
**Note De-Identified Jazlynn Nemetz Obfuscation** I started a CPAP Titration through the [facility] Provider Portal. Notification/prior authorization number [id number]

## 2023-11-16 NOTE — Progress Notes (Unsigned)
 No chief complaint on file.  Victoria [name] is a 43 y.o. female who presents for a complete physical and follow-up on chronic problems.    Diabetes follow-up:  Last seen in 05/2023 at which time A1c was 7.8%. At that time she was on Victoza injections (1.2 mg dose--wasn't able to tolerate 1.8 mg), and metformin . She subsequently was switched to Ozempic  0.25 mg, but wasn't using properly--thought dosing was monthly rather than weekly. She sees pharmacist regularly to help address her issues with her medications, and is scheduled for a f/u in a few weeks.  She reports compliance with metformin  1000 mg BID.  Constipation is being managed with stool softeners and miralax . ***  (She previously didn't tolerate Trulicity  due to nausea, belching.  Previously didn't tolerate Farxiga [name] due to yeast issues.)   Sugars are running ***   No longer qualifies for CGM.     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   BPs are running ***  BP Readings from Last 3 Encounters:  09/30/23 (!) 139/107  09/04/23 135/85  07/02/23 (!) 142/92     She denies headaches, dizziness, chest pain, shortness of breath, edema, muscle cramps.    OSA: Sleep study 06/2023 showed moderate OSA. ***did she get titration? Using CPAP?     Vitamin D  deficiency:  Last level was 28.7 in 10/2022, when taking 4000 IU daily (but ran out a week prior to test). Previously had been 30.3 in 12/2021, when taking 2000 U daily.  It had been consistently low prior to that. She continues to take 4000 IU daily. ***     Hyperlipidemia:  She is taking atorvastatin  20mg  and denies side effects. Still using condoms for contraception, and aware of the potential risks of statins and pregnancy.  Last lipids were at goal Lab Results  Component Value Date   CHOL 141 05/29/2023   HDL 52 05/29/2023   LDLCALC 67 05/29/2023   TRIG 124 05/29/2023   CHOLHDL 2.7 05/29/2023     Anemia: She reports that her menses remain heavy,  monthly.  Cycles are more regular (no longer having 2x/month). She hadn't seen GYN in over 4 years, hasn't scheduled visit yet. She has been taking a MVI with iron , no separate iron . She eats liver pudding when on her cycles. Last Hgb was improved. Denies craving ice, fatigue, dizziness.   Lab Results  Component Value Date   WBC 7.1 09/30/2023   HGB 11.3 (L) 09/30/2023   HCT 33.7 (L) 09/30/2023   MCV 79.1 (L) 09/30/2023   PLT 286 09/30/2023   Lab Results  Component Value Date   IRON  32 12/27/2021   FERRITIN 26 11/07/2022      Allergies and asthma: she saw allergist in 06/2023. She was prescribed Asmanex , flonase  and xyzal . Allergy testing? ***  She takes claritin prn. She last needed albuterol  about a week ago.  Didn't feel that allergies were flaring, just felt short of breath.    Immunization History  Administered Date(s) Administered   Influenza, Seasonal, Injecte, Preservative Fre 02/20/2023   Influenza,inj,Quad PF,6+ Mos 01/18/2015, 03/10/2017, 03/13/2020, 04/17/2022   Influenza-Unspecified 03/05/2016, 02/27/2018, 02/20/2019, 04/05/2021   PFIZER(Purple Top)SARS-COV-2 Vaccination 01/05/2020, 01/27/2020   PPD Test 02/12/2023   Pneumococcal Polysaccharide-23 08/20/2018   Tdap 05/03/2009, 11/20/2012, 02/12/2016   Declines COVID boosters. Last Pap smear: ?? Through Dr. Horacio, hasn't seen in 3-4 years Last mammogram: never Last colonoscopy: never Last DEXA: n/a Dentist: doesn't have dental insurance.  Last went a year ago. She is  having a lot of dental issues and needs extractions. She has contacted [facility] dental school and is waiting to see if they will do her extractions.  She is looking to get dental insurance for herself and her family. ***UPDATE THIS IS LAST YEAR Ophtho: yearly, last 12/2022 Exercise: not regularly.   PMH, PSH, SH and FH were reviewed and updated   Review of Systems  Constitutional: Negative.  Negative for chills, fever and weight loss.  HENT:   Negative for congestion and hearing loss.   Eyes: Negative.   Respiratory:  Negative for cough, shortness of breath and wheezing.   Cardiovascular:  Negative for chest pain, palpitations and leg swelling.  Gastrointestinal:  Negative for abdominal pain, blood in stool, constipation, diarrhea, melena, nausea and vomiting.  Genitourinary:  Negative for dysuria, frequency, hematuria and urgency.  Musculoskeletal:  Negative for joint pain.  Skin:  Negative for rash.  Neurological:  Negative for dizziness, tingling, tremors, weakness and headaches.  Endo/Heme/Allergies:  Negative for environmental allergies. Does not bruise/bleed easily.  Psychiatric/Behavioral:  Negative for depression and memory loss. The patient is not nervous/anxious and does not have insomnia.    UPDATE ALL*** Allergie? Asthma Heavy cycles? HA/vertigo--ER visit last month (09/2023)    PHYSICAL EXAM:  There were no vitals taken for this visit.  Wt Readings from Last 3 Encounters:  07/24/23 281 lb (127.5 kg)  07/02/23 285 lb 11.2 oz (129.6 kg)  05/29/23 282 lb 12.8 oz (128.3 kg)   General Appearance:    Alert, cooperative, no distress, appears stated age  Head:    Normocephalic, without obvious abnormality, atraumatic  Eyes:    PERRL, conjunctiva/corneas clear, EOM's intact, fundi    benign  Ears:    Normal TM's and external ear canals  Nose:   No drainage or sinus tenderness  Throat:   Lips, mucosa, and tongue normal, OP clear  Neck:   Supple, no lymphadenopathy;  thyroid :  no enlargement/ tenderness/nodules; no carotid bruit or JVD  Back:    Spine nontender, no curvature, ROM normal, no CVA     tenderness  Lungs:     Clear to auscultation bilaterally without wheezes, rales or     ronchi; respirations unlabored  Chest Wall:    No tenderness or deformity   Heart:    Regular rate and rhythm, S1 and S2 normal, no murmur, rub   or gallop  Breast Exam:    Deferred to GYN  Abdomen:     Soft, non-tender,  nondistended, normoactive bowel sounds,    no masses, no hepatosplenomegaly  Genitalia:    Deferred to GYN     Extremities:   No clubbing, cyanosis or edema.  Pulses:   2+ and symmetric all extremities  Skin:   Skin color, texture, turgor normal, no rashes or lesions  Lymph nodes:   Cervical, supraclavicular, and axillary nodes normal  Neurologic:   CNII-XII intact, normal strength, sensation and gait; reflexes 2+ and symmetric throughout          Psych:   Normal mood, affect, hygiene and grooming.     Lab Results  Component Value Date   HGBA1C 7.8 (A) 05/29/2023    ASSESSMENT/PLAN:   Has she seen GYN??? (Dr. Horacio in past).  We have NO RECORD --please request notes if she has been, and if she hasn't, likely has been at least 4-5 years and needs to schedule Did she get mammo?  Is she using CPAP? Taking ozempic  weekly?  DM eye exam  due August  Discussed monthly self breast exams and yearly mammograms --due now, reminded--she can get at GYN vs [facility] or [facility]; at least 30 minutes of aerobic activity at least 5 days/week, weight-bearing exercise at least 2x/week; proper sunscreen use reviewed; healthy diet, including goals of calcium  and vitamin D  intake and alcohol recommendations (less than or equal to 1 drink/day) reviewed; regular seatbelt use; changing batteries in smoke detectors.  Immunization recommendations discussed, continue yearly flu shots.  COVID booster recommended when new one out in the Fall.  Colonoscopy recommendations reviewed, age 75.

## 2023-11-16 NOTE — Patient Instructions (Incomplete)
  HEALTH MAINTENANCE RECOMMENDATIONS:  It is recommended that you get at least 30 minutes of aerobic exercise at least 5 days/week (for weight loss, you may need as much as 60-90 minutes). This can be any activity that gets your heart rate up. This can be divided in 10-15 minute intervals if needed, but try and build up your endurance at least once a week.  Weight bearing exercise is also recommended twice weekly.  Eat a healthy diet with lots of vegetables, fruits and fiber.  Colorful foods have a lot of vitamins (ie green vegetables, tomatoes, red peppers, etc).  Limit sweet tea, regular sodas and alcoholic beverages, all of which has a lot of calories and sugar.  Up to 1 alcoholic drink daily may be beneficial for women (unless trying to lose weight, watch sugars).  Drink a lot of water.  Calcium  recommendations are 1200-1500 mg daily (1500 mg for postmenopausal women or women without ovaries), and vitamin D  1000 IU daily.  This should be obtained from diet and/or supplements (vitamins), and calcium  should not be taken all at once, but in divided doses.  Monthly self breast exams and yearly mammograms for women over the age of 59 is recommended.  Sunscreen of at least SPF 30 should be used on all sun-exposed parts of the skin when outside between the hours of 10 am and 4 pm (not just when at beach or pool, but even with exercise, golf, tennis, and yard work!)  Use a sunscreen that says broad spectrum so it covers both UVA and UVB rays, and make sure to reapply every 1-2 hours.  Remember to change the batteries in your smoke detectors when changing your clock times in the spring and fall. Carbon monoxide detectors are recommended for your home.  Use your seat belt every time you are in a car, and please drive safely and not be distracted with cell phones and texting while driving.  You are long past due to see your gynecologist. Please call this week to get an appointment! I will send results  of your labs, knowing that you have had ongoing iron  deficiency anemia, related to heavy cycles.  You should consider treating these (with IUD, or other measures).  Your diabetes is poorly controlled. I want you to increase the ozempic  to 0.5 mg (on your current sample). I'm sending in a prescription for the higher dose, and sending a note to Jon to assist if there are any coverage issues.  Start taking a cap full of miralax  once daily in the morning. If/when your stools are too frequent or loose, cut back the dose or frequency (either 1/2 cap daily, or the full cap less often (2-4x/week). Consider fiber supplements if you can't get adequate fiber from your diet.  If medications aren't covered by your insurance, you need to contact the prescribing doctor so that they can find a covered alternative (asmanex , from the allergist for your asthma--please call them and let them know it wasn't covered).  You are due for a mammogram. You can schedule this yourself at the [facility], [place], or possibly get it done through your GYN;s office--you can ask them about this when you call to schedule your visit.  Schedule your diabetic eye exam for 12/2023.

## 2023-11-17 ENCOUNTER — Encounter: Payer: Self-pay | Admitting: Family Medicine

## 2023-11-17 ENCOUNTER — Ambulatory Visit (INDEPENDENT_AMBULATORY_CARE_PROVIDER_SITE_OTHER): Payer: 59 | Admitting: Family Medicine

## 2023-11-17 VITALS — BP 130/80 | HR 88 | Ht 64.0 in | Wt 274.0 lb

## 2023-11-17 DIAGNOSIS — E559 Vitamin D deficiency, unspecified: Secondary | ICD-10-CM | POA: Diagnosis not present

## 2023-11-17 DIAGNOSIS — E118 Type 2 diabetes mellitus with unspecified complications: Secondary | ICD-10-CM

## 2023-11-17 DIAGNOSIS — Z5181 Encounter for therapeutic drug level monitoring: Secondary | ICD-10-CM | POA: Diagnosis not present

## 2023-11-17 DIAGNOSIS — Z6841 Body Mass Index (BMI) 40.0 and over, adult: Secondary | ICD-10-CM | POA: Diagnosis not present

## 2023-11-17 DIAGNOSIS — Z0000 Encounter for general adult medical examination without abnormal findings: Secondary | ICD-10-CM

## 2023-11-17 DIAGNOSIS — J4521 Mild intermittent asthma with (acute) exacerbation: Secondary | ICD-10-CM

## 2023-11-17 DIAGNOSIS — D509 Iron deficiency anemia, unspecified: Secondary | ICD-10-CM | POA: Diagnosis not present

## 2023-11-17 DIAGNOSIS — I10 Essential (primary) hypertension: Secondary | ICD-10-CM

## 2023-11-17 DIAGNOSIS — E1169 Type 2 diabetes mellitus with other specified complication: Secondary | ICD-10-CM

## 2023-11-17 DIAGNOSIS — J45909 Unspecified asthma, uncomplicated: Secondary | ICD-10-CM

## 2023-11-17 DIAGNOSIS — E66813 Obesity, class 3: Secondary | ICD-10-CM | POA: Diagnosis not present

## 2023-11-17 DIAGNOSIS — N920 Excessive and frequent menstruation with regular cycle: Secondary | ICD-10-CM

## 2023-11-17 DIAGNOSIS — G4733 Obstructive sleep apnea (adult) (pediatric): Secondary | ICD-10-CM | POA: Diagnosis not present

## 2023-11-17 DIAGNOSIS — E785 Hyperlipidemia, unspecified: Secondary | ICD-10-CM

## 2023-11-17 DIAGNOSIS — I152 Hypertension secondary to endocrine disorders: Secondary | ICD-10-CM

## 2023-11-17 DIAGNOSIS — E1159 Type 2 diabetes mellitus with other circulatory complications: Secondary | ICD-10-CM | POA: Diagnosis not present

## 2023-11-17 DIAGNOSIS — J301 Allergic rhinitis due to pollen: Secondary | ICD-10-CM

## 2023-11-17 LAB — POCT GLYCOSYLATED HEMOGLOBIN (HGB A1C): Hemoglobin A1C: 10 % — AB (ref 4.0–5.6)

## 2023-11-17 MED ORDER — OZEMPIC (0.25 OR 0.5 MG/DOSE) 2 MG/3ML ~~LOC~~ SOPN: 0.5000 mg | PEN_INJECTOR | SUBCUTANEOUS | 0 refills | Status: DC

## 2023-11-18 ENCOUNTER — Telehealth: Payer: Self-pay

## 2023-11-18 ENCOUNTER — Ambulatory Visit: Payer: Self-pay | Admitting: Family Medicine

## 2023-11-18 LAB — CBC WITH DIFFERENTIAL/PLATELET
Basophils Absolute: 0 10*3/uL (ref 0.0–0.2)
Basos: 0 %
EOS (ABSOLUTE): 0.1 10*3/uL (ref 0.0–0.4)
Eos: 1 %
Hematocrit: 40.2 % (ref 34.0–46.6)
Hemoglobin: 12.7 g/dL (ref 11.1–15.9)
Immature Grans (Abs): 0 10*3/uL (ref 0.0–0.1)
Immature Granulocytes: 0 %
Lymphocytes Absolute: 2.9 10*3/uL (ref 0.7–3.1)
Lymphs: 35 %
MCH: 26.6 pg (ref 26.6–33.0)
MCHC: 31.6 g/dL (ref 31.5–35.7)
MCV: 84 fL (ref 79–97)
Monocytes Absolute: 0.5 10*3/uL (ref 0.1–0.9)
Monocytes: 7 %
Neutrophils Absolute: 4.7 10*3/uL (ref 1.4–7.0)
Neutrophils: 57 %
Platelets: 333 10*3/uL (ref 150–450)
RBC: 4.78 x10E6/uL (ref 3.77–5.28)
RDW: 14.7 % (ref 11.7–15.4)
WBC: 8.3 10*3/uL (ref 3.4–10.8)

## 2023-11-18 LAB — IRON,TIBC AND FERRITIN PANEL
Ferritin: 23 ng/mL (ref 15–150)
Iron Saturation: 9 % — CL (ref 15–55)
Iron: 35 ug/dL (ref 27–159)
Total Iron Binding Capacity: 399 ug/dL (ref 250–450)
UIBC: 364 ug/dL (ref 131–425)

## 2023-11-18 LAB — COMPREHENSIVE METABOLIC PANEL WITH GFR
ALT: 14 IU/L (ref 0–32)
AST: 15 IU/L (ref 0–40)
Albumin: 4.3 g/dL (ref 3.9–4.9)
Alkaline Phosphatase: 76 IU/L (ref 44–121)
BUN/Creatinine Ratio: 10 (ref 9–23)
BUN: 7 mg/dL (ref 6–24)
Bilirubin Total: 0.3 mg/dL (ref 0.0–1.2)
CO2: 19 mmol/L — ABNORMAL LOW (ref 20–29)
Calcium: 9.8 mg/dL (ref 8.7–10.2)
Chloride: 101 mmol/L (ref 96–106)
Creatinine, Ser: 0.73 mg/dL (ref 0.57–1.00)
Globulin, Total: 2.9 g/dL (ref 1.5–4.5)
Glucose: 122 mg/dL — ABNORMAL HIGH (ref 70–99)
Potassium: 3.8 mmol/L (ref 3.5–5.2)
Sodium: 139 mmol/L (ref 134–144)
Total Protein: 7.2 g/dL (ref 6.0–8.5)
eGFR: 107 mL/min/{1.73_m2} (ref >59)

## 2023-11-18 LAB — VITAMIN D 25 HYDROXY (VIT D DEFICIENCY, FRACTURES): Vit D, 25-Hydroxy: 35.8 ng/mL (ref 30.0–100.0)

## 2023-11-18 LAB — TSH: TSH: 1.45 u[IU]/mL (ref 0.450–4.500)

## 2023-11-18 NOTE — Progress Notes (Signed)
   11/18/2023  Patient ID: Victoria [name], female   DOB: 12/26/1980, 43 y.o.   MRN: [id number]  Contacted pharmacy and confirmed the following prescriptions are covered by patient's insurance with a copay of $4 each.  Ozempic  - ready for pick up Asmanex , Accu Chek guide meter and supplies being filled today  Patient aware and plans to pick up today, confirms these copays are affordable.  Follow Up: 11/27/23  [name], PharmD Clinical Pharmacist [phone]

## 2023-11-20 NOTE — Telephone Encounter (Signed)
**Note De-Identified Galo Sayed Obfuscation** Letter received from [facility] fax stating that they have approved this PA from 01/12/2024-04/13/2024. Reference #: [id number].  I have transferred the order to the sleep lab.

## 2023-11-27 ENCOUNTER — Telehealth: Payer: Self-pay

## 2023-11-27 ENCOUNTER — Other Ambulatory Visit

## 2023-11-27 DIAGNOSIS — E118 Type 2 diabetes mellitus with unspecified complications: Secondary | ICD-10-CM

## 2023-11-27 NOTE — Progress Notes (Deleted)
   11/27/2023  Patient ID: Victoria [name], female   DOB: July 21, 1980, 43 y.o.   MRN: [id number]  Attempted to contact patient for scheduled appointment for medication management. Left HIPAA compliant message for patient to return my call at their convenience.    Victoria [name], PharmD Clinical Pharmacist [phone]

## 2023-11-27 NOTE — Progress Notes (Signed)
 11/27/2023 Name: Victoria [name] MRN: [id number] DOB: 1981/03/28  Chief Complaint  Patient presents with   Medication Management   Diabetes    Victoria [name] is a 43 y.o. year old female who presented for a telephone visit.   They were referred to the pharmacist by their PCP for assistance in managing complex medication management.    Subjective:  Care Team: Primary Care Provider: Randol Dawes, MD ; Next Scheduled Visit: 01/30/24  Medication Access/Adherence  Current Pharmacy:  [id number] - [place], [place] - [address] AT [facility] [address] [place] [address] Phone: [phone] Fax: [phone]  [id number] - [place] ([place]), [place] - [address] [place] ([place]) [place] [address] Phone: [phone] Fax: [phone]  [facility] [place] [address] [place] [address] Phone: [phone] Fax: [phone]   Patient reports affordability concerns with their medications: No Patient reports access/transportation concerns to their pharmacy: No  Patient reports adherence concerns with their medications:  No   Diabetes:  Current medications: Metformin  1000mg  BID, Ozempic  0.5mg  (giving 2nd dose tonight) Medications tried in the past: Rybelsus  (cost-not covered by ins), Farxiga [name] (yeast issues), Bydureon  (not covered), Januvia  (stopped due to cost concern in the past), Did not tolerate dose increase of Victoza to 1.8mg  (severe nausea, could not keep food down, belching).  Current Glucose Readings:  Fasting sugar 150 this morning, reports still above 130 but improving  Patient denies hypoglycemic s/sx including dizziness, shakiness, sweating. Patient denies hyperglycemic symptoms including polyuria, polydipsia, polyphagia, nocturia, neuropathy, blurred vision.   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 Sig Documenting Provider Last Dose Status Informant  Accu-Chek Softclix Lancets lancets [id number]  Use as directed to check blood sugars once daily  Patient not taking: Reported on 11/17/2023   Randol Dawes, MD  Active   acetaminophen  (TYLENOL ) 500 MG tablet [id number]  Take 1,000 mg by mouth as needed for moderate pain (pain score 4-6). [provider]  Active            Med Note BEVERLEE, LUCIENNE JULIANNA Schaumann May 29, 2023  9:29 AM) As needed  albuterol  (PROVENTIL ) (2.5 MG/3ML) 0.083% nebulizer solution [id number]  Take 3 mLs (2.5 mg total) by nebulization every 6 (six) hours as needed for wheezing or shortness of breath. Tobie Arleta SQUIBB, MD  Active   albuterol  (VENTOLIN  HFA) 108 [phone] Base) MCG/ACT inhaler [id number]  Inhale 2 puffs into the lungs every 6 (six) hours as needed for wheezing or shortness of breath. Tobie Arleta SQUIBB, MD  Active   atorvastatin  (LIPITOR) 20 MG tablet [id number]  Take 1 tablet (20 mg total) by mouth daily. Randol Dawes, MD  Active   Blood Glucose Monitoring Suppl (ACCU-CHEK GUIDE ME) w/Device KIT [id number]  Use as instructed to check blood sugar once daily  Patient not taking: Reported on 11/17/2023   Randol Dawes, MD  Active   carvedilol  (COREG ) 25 MG tablet [id number]  TAKE 1 TABLET BY MOUTH TWICE A DAY Tysinger, Alm RAMAN, PA-C  Active    Cholecalciferol (VITAMIN D ) 50 MCG (2000 UT) CAPS [id number]  Take 2 capsules by mouth daily. [provider]  Active Self           Med Note REGINO HUNTER BIRCH   Mon Oct 23, 2020  4:34 AM)    Continuous Glucose Sensor (FREESTYLE LIBRE 3 PLUS SENSOR) [place] [id number]  1 each by Does not apply route every 14 (fourteen) days. Change sensor every 15 days.  Patient not taking: Reported on 11/17/2023   Randol Dawes, MD  Active   fluticasone  (FLONASE ) 50 MCG/ACT nasal spray [id number]  Place 2 sprays into both nostrils daily. Tobie Arleta SQUIBB, MD  Active   glucose blood (ACCU-CHEK GUIDE TEST) test strip [id number]  Use as directed to check blood sugar once daily  Patient not taking: Reported on 11/17/2023   Randol Dawes, MD  Active   ibuprofen  (ADVIL ) 600 MG tablet [id number]  Take 1 tablet (600 mg total) by mouth every 6 (six) hours as needed. Silver Wonda LABOR, PA  Active            Med Note BEVERLEE, [name]   Thu May 29, 2023  9:30 AM) As needed  Insulin  Pen Needle 32G X 4 MM MISC [id number]  1 Needle by Does not apply route daily.  Patient not taking: Reported on 11/17/2023   Early, Sara E, NP  Active   levocetirizine (XYZAL ) 5 MG tablet [id number]  Take 1 tablet (5 mg total) by mouth every evening. Tobie Arleta SQUIBB, MD  Active   loratadine (CLARITIN) 10 MG tablet [id number]  Take 10 mg by mouth daily. [provider]  Active            Med Note BEVERLEE, [name]   Thu May 29, 2023  9:30 AM) As needed  meclizine  (ANTIVERT ) 25 MG tablet [id number]  Take 1 tablet (25 mg total) by mouth 3 (three) times daily as needed for dizziness. Desiderio Chew, PA-C  Active   metFORMIN  (GLUCOPHAGE ) 1000 MG tablet [id number]  Take 1 tablet (1,000 mg total) by mouth 2 (two) times daily with a meal. Randol Dawes, MD  Active   methocarbamol  (ROBAXIN ) 500 MG tablet [id number]  Take 1-2 tablets (500-1,000 mg total) by mouth every 8 (eight) hours as needed for muscle spasms. Randol Dawes, MD  Active            Med Note Mineral,  DELAWARE F   Thu May 29, 2023  9:30 AM) As needed, rarely  Mometasone Furoate  (ASMANEX  HFA) 50 MCG/ACT AERO [id number]  INHALE 2 PUFFS INTO THE LUNGS IN THE MORNING AND AT BEDTIME.  Patient not taking: Reported on 11/17/2023   Tobie Arleta SQUIBB, MD  Active   Multiple Vitamins-Minerals (ONE A DAY WOMEN 50 PLUS PO) [id number]  Take 1 tablet by mouth daily. [provider]  Active            Med Note BEVERLEE, [name]   Wed Apr 17, 2022 11:00 AM) With iron   Semaglutide ,0.25 or 0.5MG /DOS, (OZEMPIC , 0.25 OR 0.5 MG/DOSE,) 2  MG/3ML SOPN [id number]  Inject 0.5 mg into the skin once a week. Randol Dawes, MD  Active   spironolactone  (ALDACTONE ) 50 MG tablet [id number]  TAKE 1 TABLET BY MOUTH EVERY DAY Walker, Caitlin S, NP  Active   valsartan  (DIOVAN ) 320 MG tablet [id number]  TAKE 1 TABLET BY MOUTH EVERY DAY Randol Dawes, MD  Active               Assessment/Plan:   Diabetes: - Currently uncontrolled  - Reviewed long term cardiovascular and renal outcomes of uncontrolled blood sugar - Reviewed goal A1c, goal fasting, and goal 2 hour post prandial glucose - Reviewed dietary modifications including low-carb diet - Reviewed lifestyle modifications including: achieving a total of of physical activity/week - Recommend to notify the office/myself IMMEDIATELY upon any delays or issues obtaining/taking medications  - Recommend to check glucose at least once daily  -Continue current medication therapy. Plan to increase to 1mg  in 2 weeks if patient continues to tolerate well and sugars still elevated   Follow Up Plan: 2 weeks   [name], PharmD Clinical Pharmacist [phone]

## 2023-11-27 NOTE — Progress Notes (Signed)
   11/27/2023  Patient ID: Victoria [name], female   DOB: 1981-02-02, 43 y.o.   MRN: [id number]  Attempted to contact patient for scheduled appointment for medication management. Left HIPAA compliant message for patient to return my call at their convenience.    Victoria [name], PharmD Clinical Pharmacist [phone]

## 2023-12-08 ENCOUNTER — Other Ambulatory Visit

## 2023-12-08 NOTE — Progress Notes (Unsigned)
   12/08/2023 Name: Victoria [name] MRN: [id number] DOB: [birth year]  Chief Complaint  Patient presents with   Diabetes   Medication Management    Victoria [name] is a 43 y.o. year old female who presented for a telephone visit.   They were referred to the pharmacist by their PCP for assistance in managing complex medication management.    Subjective:  Care Team: Primary Care Provider: Randol Dawes, MD ; Next Scheduled Visit: 01/30/24  Medication Access/Adherence  Current Pharmacy:  [id number] - [place], [place] - [address] AT [facility] [address] [place] [address] Phone: [phone] Fax: [phone]  [id number] - [place] ([place]), [place] - [address] [place] ([place]) [place] [address] Phone: [phone] Fax: [phone]  [facility] [address] [place] [address] Phone: [phone] Fax: [phone]   Patient reports affordability concerns with their medications: No Patient reports access/transportation concerns to their pharmacy: No  Patient reports adherence concerns with their medications:  No   Diabetes:  Current medications: Metformin  1000mg  BID, Ozempic  0.5mg  (giving 4th dose tonight) Medications tried in the past: Rybelsus  (cost-not covered by ins), Farxiga [name] (yeast issues), Bydureon  (not covered), Januvia  (stopped due to cost concern in the past), Did not tolerate dose increase of Victoza to 1.8mg  (severe nausea, could not keep food down, belching).  Current Glucose Readings:  Fasting sugar 147, reports still above 130 consistently but improving  Patient denies hypoglycemic s/sx including dizziness, shakiness, sweating. Patient denies hyperglycemic symptoms including polyuria, polydipsia, polyphagia, nocturia, neuropathy, blurred vision.   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   Objective:  Lab Results  Component Value Date   HGBA1C 10.0 (A) 11/17/2023    Lab Results  Component Value Date   CREATININE 0.73 11/17/2023   BUN 7 11/17/2023   NA 139 11/17/2023   K 3.8 11/17/2023   CL 101 11/17/2023   CO2 19 (L) 11/17/2023    Lab Results  Component Value Date   CHOL 141 05/29/2023   HDL 52 05/29/2023   LDLCALC 67 05/29/2023   TRIG 124 05/29/2023   CHOLHDL 2.7 05/29/2023    Medications Reviewed Today   Medications were not reviewed in this encounter       Assessment/Plan:   Diabetes: - Currently uncontrolled  - Reviewed long term cardiovascular and renal outcomes of uncontrolled blood sugar - Reviewed goal A1c, goal fasting, and goal 2 hour post prandial glucose - Reviewed dietary modifications including low-carb diet - Reviewed lifestyle modifications including: achieving a total of of physical activity/week - Recommend to notify the office/myself IMMEDIATELY upon any delays or issues obtaining/taking medications  - Recommend to check glucose at least once daily  -INCREASE Ozempic  to 1mg  once weekly after 4th dose of Ozempic  0.5mg  (start new dose of 1mg  on 7/24).   Follow Up Plan: 3 weeks   [name], PharmD Clinical Pharmacist [phone]

## 2023-12-10 ENCOUNTER — Other Ambulatory Visit: Payer: Self-pay

## 2023-12-10 ENCOUNTER — Emergency Department (HOSPITAL_BASED_OUTPATIENT_CLINIC_OR_DEPARTMENT_OTHER): Admitting: Radiology

## 2023-12-10 ENCOUNTER — Emergency Department (HOSPITAL_BASED_OUTPATIENT_CLINIC_OR_DEPARTMENT_OTHER)
Admission: EM | Admit: 2023-12-10 | Discharge: 2023-12-10 | Disposition: A | Discharge status: Home / Self Care | Attending: Emergency Medicine | Admitting: Emergency Medicine

## 2023-12-10 DIAGNOSIS — E119 Type 2 diabetes mellitus without complications: Secondary | ICD-10-CM | POA: Diagnosis not present

## 2023-12-10 DIAGNOSIS — J45909 Unspecified asthma, uncomplicated: Secondary | ICD-10-CM | POA: Insufficient documentation

## 2023-12-10 DIAGNOSIS — Z7951 Long term (current) use of inhaled steroids: Secondary | ICD-10-CM | POA: Insufficient documentation

## 2023-12-10 DIAGNOSIS — J069 Acute upper respiratory infection, unspecified: Secondary | ICD-10-CM | POA: Diagnosis not present

## 2023-12-10 DIAGNOSIS — Z91010 Allergy to peanuts: Secondary | ICD-10-CM | POA: Diagnosis not present

## 2023-12-10 DIAGNOSIS — R0602 Shortness of breath: Secondary | ICD-10-CM | POA: Diagnosis not present

## 2023-12-10 DIAGNOSIS — R079 Chest pain, unspecified: Secondary | ICD-10-CM | POA: Diagnosis not present

## 2023-12-10 DIAGNOSIS — I10 Essential (primary) hypertension: Secondary | ICD-10-CM | POA: Diagnosis not present

## 2023-12-10 DIAGNOSIS — R0789 Other chest pain: Secondary | ICD-10-CM | POA: Diagnosis not present

## 2023-12-10 DIAGNOSIS — Z79899 Other long term (current) drug therapy: Secondary | ICD-10-CM | POA: Diagnosis not present

## 2023-12-10 DIAGNOSIS — Z794 Long term (current) use of insulin: Secondary | ICD-10-CM | POA: Insufficient documentation

## 2023-12-10 LAB — BASIC METABOLIC PANEL WITH GFR
Anion gap: 12 (ref 5–15)
BUN: 8 mg/dL (ref 6–20)
CO2: 20 mmol/L — ABNORMAL LOW (ref 22–32)
Calcium: 9.5 mg/dL (ref 8.9–10.3)
Chloride: 105 mmol/L (ref 98–111)
Creatinine, Ser: 0.74 mg/dL (ref 0.44–1.00)
GFR, Estimated: >60 mL/min (ref >60)
Glucose, Bld: 189 mg/dL — ABNORMAL HIGH (ref 70–99)
Potassium: 4.1 mmol/L (ref 3.5–5.1)
Sodium: 137 mmol/L (ref 135–145)

## 2023-12-10 LAB — RESP PANEL BY RT-PCR (RSV, FLU A&B, COVID)  RVPGX2
Influenza A by PCR: NEGATIVE
Influenza B by PCR: NEGATIVE
Resp Syncytial Virus by PCR: NEGATIVE
SARS Coronavirus 2 by RT PCR: NEGATIVE

## 2023-12-10 LAB — CBC
HCT: 32.2 % — ABNORMAL LOW (ref 36.0–46.0)
Hemoglobin: 10.5 g/dL — ABNORMAL LOW (ref 12.0–15.0)
MCH: 26.5 pg (ref 26.0–34.0)
MCHC: 32.6 g/dL (ref 30.0–36.0)
MCV: 81.3 fL (ref 80.0–100.0)
Platelets: 276 K/uL (ref 150–400)
RBC: 3.96 MIL/uL (ref 3.87–5.11)
RDW: 13.8 % (ref 11.5–15.5)
WBC: 7.1 K/uL (ref 4.0–10.5)
nRBC: 0 % (ref 0.0–0.2)

## 2023-12-10 LAB — TROPONIN T, HIGH SENSITIVITY: Troponin T High Sensitivity: <15 ng/L (ref <19)

## 2023-12-10 LAB — GROUP A STREP BY PCR: Group A Strep by PCR: NOT DETECTED
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
## 2023-12-10 NOTE — ED Triage Notes (Signed)
 Pt reports concern for chest pain and sore throat pain. Sts generalized CP described as heavy ongoing for 3-4 days. Associated with productive green cough, SOB, and sore throat.

## 2023-12-12 MED ORDER — SEMAGLUTIDE (1 MG/DOSE) 4 MG/3ML ~~LOC~~ SOPN: 1.0000 mg | PEN_INJECTOR | SUBCUTANEOUS | 0 refills | Status: DC

## 2023-12-12 NOTE — Addendum Note (Signed)
 Addended by: LIONELL JON DEL on: 12/12/2023 01:21 PM   Modules accepted: Orders

## 2023-12-16 ENCOUNTER — Telehealth: Admitting: Physician Assistant

## 2023-12-16 DIAGNOSIS — J028 Acute pharyngitis due to other specified organisms: Secondary | ICD-10-CM

## 2023-12-16 DIAGNOSIS — B9689 Other specified bacterial agents as the cause of diseases classified elsewhere: Secondary | ICD-10-CM | POA: Diagnosis not present

## 2023-12-16 MED ORDER — AMOXICILLIN 500 MG PO TABS: 500.0000 mg | ORAL_TABLET | Freq: Two times a day (BID) | ORAL | 0 refills | Status: AC

## 2023-12-16 NOTE — Progress Notes (Signed)
 I have spent 5 minutes in review of e-visit questionnaire, review and updating patient chart, medical decision making and response to patient.   Piedad Climes, PA-C

## 2023-12-16 NOTE — Progress Notes (Signed)
 E-Visit for Sore Throat   We are sorry that you are not feeling well.  Here is how we plan to help!  Based on what you have shared with me it is likely that you have a bacterial pharyngitis. I have prescribed Amoxicillin  500 mg twice a day for 10 days. For throat pain, we recommend over the counter oral pain relief medications such as acetaminophen  or aspirin, or anti-inflammatory medications such as ibuprofen  or naproxen  sodium. Topical treatments such as oral throat lozenges or sprays may be used as needed. Strep infections are not as easily transmitted as other respiratory infections, however we still recommend that you avoid close contact with loved ones, especially the very young and elderly.  Remember to wash your hands thoroughly throughout the day as this is the number one way to prevent the spread of infection and wipe down door knobs and counters with disinfectant.   Home Care: Only take medications as instructed by your medical team. Complete the entire course of an antibiotic. Do not take these medications with alcohol. A steam or ultrasonic humidifier can help congestion.  You can place a towel over your head and breathe in the steam from hot water coming from a faucet. Avoid close contacts especially the very young and the elderly. Cover your mouth when you cough or sneeze. Always remember to wash your hands.  Get Help Right Away If: You develop worsening fever or sinus pain. You develop a severe head ache or visual changes. Your symptoms persist after you have completed your treatment plan.  Make sure you Understand these instructions. Will watch your condition. Will get help right away if you are not doing well or get worse.   Thank you for choosing an e-visit.  Your e-visit answers were reviewed by a board certified advanced clinical practitioner to complete your personal care plan. Depending upon the condition, your plan could have included both over the counter or  prescription medications.  Please review your pharmacy choice. Make sure the pharmacy is open so you can pick up prescription now. If there is a problem, you may contact your provider through [employer] and have the prescription routed to another pharmacy.  Your safety is important to us . If you have drug allergies check your prescription carefully.   For the next 24 hours you can use MyChart to ask questions about today's visit, request a non-urgent call back, or ask for a work or school excuse. You will get an email in the next two days asking about your experience. I hope that your e-visit has been valuable and will speed your recovery.

## 2023-12-28 ENCOUNTER — Other Ambulatory Visit: Payer: Self-pay | Admitting: Medical

## 2023-12-28 DIAGNOSIS — I10 Essential (primary) hypertension: Secondary | ICD-10-CM

## 2024-01-02 ENCOUNTER — Other Ambulatory Visit

## 2024-01-02 DIAGNOSIS — E118 Type 2 diabetes mellitus with unspecified complications: Secondary | ICD-10-CM

## 2024-01-02 MED ORDER — SEMAGLUTIDE (1 MG/DOSE) 4 MG/3ML ~~LOC~~ SOPN: 1.0000 mg | PEN_INJECTOR | SUBCUTANEOUS | 0 refills | Status: DC

## 2024-01-02 NOTE — Progress Notes (Signed)
 01/02/2024 Name: Victoria [name] MRN: [id number] DOB: 12-30-1980  Chief Complaint  Patient presents with   Medication Management   Diabetes    Victoria [name] is a 43 y.o. year old female who presented for a telephone visit.   They were referred to the pharmacist by their PCP for assistance in managing complex medication management.    Subjective:  Care Team: Primary Care Provider: Randol Dawes, MD ; Next Scheduled Visit: 02/19/24  Medication Access/Adherence  Current Pharmacy:  [id number] - [place] [place] - [address] AT [facility] [address] [place] [address] Phone: [phone] Fax: [phone]  [id number] - [place]), [place] - [address] [place] ([place]) [place] [address] Phone: [phone] Fax: [phone]  [facility] [place] [facility] [address] [place] [address] Phone: [phone] Fax: [phone]   Patient reports affordability concerns with their medications: No Patient reports access/transportation concerns to their pharmacy: No  Patient reports adherence concerns with their medications:  No   Diabetes:  Current medications: Metformin  1000mg  BID - taking 500mg  in am, 1000mg  at night due to stomach upset concerns, Ozempic  0.5mg  (last injection 01/01/24) Medications tried in the past: Rybelsus  (cost-not covered by ins), Farxiga [name] (yeast issues), Bydureon  (not covered), Januvia  (stopped due to cost concern in the past), Did not tolerate dose increase of Victoza to 1.8mg  (severe nausea, could not keep food down, belching).  Current Glucose Readings:  Reports seeing readings in the 110s at times, sugars improving  Never started 1mg  as instructed, still at 0.5mg , reports pharmacy never filled the 1mg   Patient denies hypoglycemic s/sx including dizziness, shakiness, sweating. Patient denies hyperglycemic  symptoms including polyuria, polydipsia, polyphagia, nocturia, neuropathy, blurred vision.   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 Sig Documenting Provider Last Dose Status Informant  Accu-Chek Softclix Lancets lancets [id number]  Use as directed to check blood sugars once daily Randol Dawes, MD  Active   acetaminophen  (TYLENOL ) 500 MG tablet [id number] Yes Take 1,000 mg by mouth as needed for moderate pain (pain score 4-6). [provider]  Active            Med Note BEVERLEE, LUCIENNE JULIANNA Schaumann May 29, 2023  9:29 AM) As needed  albuterol  (PROVENTIL ) (2.5 MG/3ML) 0.083% nebulizer solution [id number] Yes Take 3 mLs (2.5 mg total) by nebulization every 6 (six) hours as needed for wheezing or shortness of breath. Tobie Arleta SQUIBB, MD  Active   albuterol  (VENTOLIN  HFA) 108 [phone] Base) MCG/ACT inhaler [id number] Yes Inhale 2 puffs into the lungs every 6 (six) hours as needed for wheezing or shortness  of breath. Tobie Arleta SQUIBB, MD  Active   atorvastatin  (LIPITOR) 20 MG tablet [id number] Yes Take 1 tablet (20 mg total) by mouth daily. Randol Dawes, MD  Active   Blood Glucose Monitoring Suppl (ACCU-CHEK GUIDE ME) w/Device KIT [id number]  Use as instructed to check blood sugar once daily Randol Dawes, MD  Active   carvedilol  (COREG ) 25 MG tablet [id number] Yes TAKE  1 TABLET BY MOUTH TWICE A DAY Knapp, Eve, MD  Active   Cholecalciferol (VITAMIN D ) 50 MCG (2000 UT) CAPS [id number] Yes Take 2 capsules by mouth daily. [provider]  Active Self           Med Note REGINO HUNTER BIRCH   Mon Oct 23, 2020  4:34 AM)    Continuous Glucose Sensor (FREESTYLE LIBRE 3 PLUS SENSOR) [place] [id number]  1 each by Does not apply route every 14 (fourteen) days. Change sensor every 15 days.  Patient not taking: Reported on 12/08/2023   Randol Dawes, MD  Active   fluticasone  (FLONASE ) 50 MCG/ACT nasal spray [id number] Yes Place 2 sprays into both nostrils daily. Tobie Arleta SQUIBB, MD  Active   glucose blood (ACCU-CHEK GUIDE TEST) test strip [id number]  Use as directed to check blood sugar once daily Randol Dawes, MD  Active   ibuprofen  (ADVIL ) 600 MG tablet [id number] Yes Take 1 tablet (600 mg total) by mouth every 6 (six) hours as needed. Silver Wonda LABOR, PA  Active            Med Note BEVERLEE, [name]   Thu May 29, 2023  9:30 AM) As needed  Insulin  Pen Needle 32G X 4 MM MISC [id number]  1 Needle by Does not apply route daily. Early, Sara E, NP  Active   levocetirizine (XYZAL ) 5 MG tablet [id number] Yes Take 1 tablet (5 mg total) by mouth every evening. Tobie Arleta SQUIBB, MD  Active   loratadine (CLARITIN) 10 MG tablet [id number]  Take 10 mg by mouth daily. [provider]  Active            Med Note BEVERLEE, [name]   Thu May 29, 2023  9:30 AM) As needed  meclizine  (ANTIVERT ) 25 MG tablet [id number] Yes Take 1 tablet (25 mg total) by mouth 3 (three) times daily as needed for dizziness. Desiderio Chew, PA-C  Active   metFORMIN  (GLUCOPHAGE ) 1000 MG tablet [id number] Yes Take 1 tablet (1,000 mg total) by mouth 2 (two) times daily with a meal. Randol Dawes, MD  Active   methocarbamol  (ROBAXIN ) 500 MG tablet [id number] Yes Take 1-2 tablets (500-1,000 mg total) by mouth every 8 (eight) hours as needed for muscle spasms. Randol Dawes, MD  Active            Med Note Glasgow, DELAWARE F   Thu  May 29, 2023  9:30 AM) As needed, rarely  Mometasone Furoate  (ASMANEX  HFA) 50 MCG/ACT AERO [id number] Yes INHALE 2 PUFFS INTO THE LUNGS IN THE MORNING AND AT BEDTIME. Tobie Arleta SQUIBB, MD  Active   Multiple Vitamins-Minerals (ONE A DAY WOMEN 50 PLUS PO) [id number] Yes Take 1 tablet by mouth daily. [provider]  Active            Med Note BEVERLEE LUCIENNE JULIANNA Stevan Apr 17, 2022 11:00 AM) With iron     Discontinued 01/02/24 [id number] (Entry Error)            Med Note>> Lionell Jon DEL, [facility]   01/02/2024  9:51  AM pharmacy states they never received, that is was wegovy , resending    spironolactone  (ALDACTONE ) 50 MG tablet [id number] Yes TAKE 1 TABLET BY MOUTH EVERY DAY Walker, Caitlin S, NP  Active   valsartan  (DIOVAN ) 320 MG tablet [id number] Yes TAKE 1 TABLET BY MOUTH EVERY DAY Randol Dawes, MD  Active               Assessment/Plan:   Diabetes: - Currently uncontrolled  - Reviewed long term cardiovascular and renal outcomes of uncontrolled blood sugar - Reviewed goal A1c, goal fasting, and goal 2 hour post prandial glucose - Reviewed dietary modifications including low-carb diet - Reviewed lifestyle modifications including: achieving a total of of physical activity/week - Recommend to notify the office/myself IMMEDIATELY upon any delays or issues obtaining/taking medications  - Recommend to check glucose at least once daily  -INCREASE Ozempic  to 1mg  once weekly next week (was still taking 0.5mg  previously) - contacted pharmacy, having to resend as they report never getting and it is being filled today along with valsartan , carvedilol  and asmanex . -Recommend switch to metformin  xr to help with stomach side effects, patient declines  Follow Up Plan: 3 weeks   [name], PharmD Clinical Pharmacist [phone]

## 2024-01-06 ENCOUNTER — Other Ambulatory Visit: Payer: Self-pay | Admitting: Internal Medicine

## 2024-01-07 ENCOUNTER — Other Ambulatory Visit: Payer: Self-pay | Admitting: Internal Medicine

## 2024-01-12 ENCOUNTER — Telehealth: Payer: Self-pay | Admitting: Pharmacy Technician

## 2024-01-12 NOTE — Progress Notes (Signed)
   01/12/2024 Name: Victoria [name] MRN: [id number] DOB: 1980/09/21  Patient is appearing on a report for [facility] [place] Metric Diabetes and last engaged with the clinical pharmacist to discuss diabetes on 01/02/2024. Contacted patient today to discuss diabetes management and completed medication review.   Diabetes Plan from last clinical pharmacist appointment:  Diabetes: - Currently uncontrolled  - Reviewed long term cardiovascular and renal outcomes of uncontrolled blood sugar - Reviewed goal A1c, goal fasting, and goal 2 hour post prandial glucose - Reviewed dietary modifications including low-carb diet - Reviewed lifestyle modifications including: achieving a total of of physical activity/week - Recommend to notify the office/myself IMMEDIATELY upon any delays or issues obtaining/taking medications  - Recommend to check glucose at least once daily  -INCREASE Ozempic  to 1mg  once weekly next week (was still taking 0.5mg  previously) - contacted pharmacy, having to resend as they report never getting and it is being filled today along with valsartan , carvedilol  and asmanex . -Recommend switch to metformin  xr to help with stomach side effects, patient declines(copy/paste from last note)   Medication Adherence Barriers Identified:  Patient made recommended medication changes per plan: Yes Patient informs she started Ozempic  1mg . She reports feeling fuller with the increased dose She reports no side effects. She also informs she takes Metformin . However, she takes 1/2 tablet when she gets off work and then will take 2nd dose of a whole tablet in the evenings. She reports she does this due to the stomach side effects that Metformin  causes for her. She does admit to occasionally forgetting the morning dose if she works a different shift. Access issues with any new medication or testing device: No She reports she has misplaced her meter at home. She declines needing another one called into the  pharmacy. She informs she knows the meter is in her room she just needs to find it. Per Dr first, Ozempic  1mg  was sold on 01/02/24 for 28 day supply and Metformin  last fold on 09/28/23 for 90 day supply.  Patient is checking blood sugars as prescribed: No She has misplaced her meter at home but she informs she took it after lunch yesterday and it was 145. She reports this is because she had wings for lunch. She reports the blood sugar reading is typically lower after she eats because she usually eat lighter like tuna and fruit. She was not able to recall any other readings.   Medication Adherence Barriers Addressed/Actions Taken:  Reviewed medication changes per plan from last clinical pharmacist note Educated patient to contact pharmacy regarding new prescriptions and refills. Reviewed instructions for monitoring blood sugars at home and reminded patient to keep a written log to review with pharmacist Reminded patient of date/time of upcoming clinical pharmacist follow up and any upcoming PCP/specialists visits. Patient denies transportation barriers to the appointment. Yes  Next clinical pharmacist appointment is scheduled for: 01/22/2024   Betzaida Cremeens, CPhT [facility] Office: [phone] Email: [email or web address]

## 2024-01-12 NOTE — Telephone Encounter (Signed)
 I called and spoke with the patient advised if she could call around to local pharmacies to see if they have in stock. Then to mychart message where we can send it.

## 2024-01-15 ENCOUNTER — Ambulatory Visit (HOSPITAL_BASED_OUTPATIENT_CLINIC_OR_DEPARTMENT_OTHER): Attending: Cardiology | Admitting: Cardiology

## 2024-01-15 DIAGNOSIS — I1A0 Resistant hypertension: Secondary | ICD-10-CM | POA: Diagnosis not present

## 2024-01-15 DIAGNOSIS — G4733 Obstructive sleep apnea (adult) (pediatric): Secondary | ICD-10-CM | POA: Diagnosis not present

## 2024-01-20 NOTE — Procedures (Signed)
  Indications for Polysomnography The patient is a 43 year-old Female who is 5' 4 and weighs 281.0 lbs. Her BMI equals 48.6.  A full night titration treatment study was performed.  MedicationNo Data. Polysomnogram Data A full night polysomnogram recorded the standard physiologic parameters including EEG, EOG, EMG, EKG, nasal and oral airflow.  Respiratory parameters of chest and abdominal movements were recorded with Respiratory Inductance Plethysmography belts.   Oxygen  saturation was recorded by pulse oximetry.  Sleep Architecture The total recording time of the polysomnogram was 368.9 minutes.  The total sleep time was 279.5 minutes.  The patient spent 0.5% of total sleep time in Stage N1, 80.5% in Stage N2, 4.7% in Stages N3, and 14.3% in REM.  Sleep latency was 0.5 minutes.   REM latency was 147.5 minutes.  Sleep Efficiency was 75.8%.  Wake after Sleep Onset time was 88.5 minutes.  Titration Summary The patient was titrated at pressures ranging from 5 cm/H20 up to 13 cm/H20. The last pressure used in the study was 13 cm/H20.  Respiratory Events The polysomnogram revealed a presence of 0 obstructive, 0 central, and 0 mixed apneas resulting in an Apnea index of 0 events per hour.  There were 17 hypopneas (GreaterEqual to3% desaturation and/or arousal) resulting in an Apnea\Hypopnea Index (AHI  GreaterEqual to3% desaturation and/or arousal) of 3.6 events per hour.  There were 6 hypopneas (GreaterEqual to4% desaturation) resulting in an Apnea\Hypopnea Index (AHI GreaterEqual to4% desaturation) of 1.3 events per hour.  There were 0 Respiratory  Effort Related Arousals resulting in a RERA index of 0 events per hour. The Respiratory Disturbance Index is 3.6 events per hour.  The snore index was 17.0 events per hour.  Mean oxygen  saturation was 95.8%.  The lowest oxygen  saturation during sleep was 90.0%.  Time spent LessEqual to88% oxygen  saturation was  minutes ().  Limb Activity There were 29  limb movements recorded.  Of this total, 10 were classified as PLMs.  Of the PLMs, 0 were associated with arousals.  The Limb Movement index was 6.2 per hour while the PLM index was 2.1 per hour.  Cardiac Summary The average pulse rate was 74.6 bpm.  The minimum pulse rate was 60.0 bpm while the maximum pulse rate was 102.0 bpm.  Cardiac rhythm was normal/abnormal.  Diagnosis: Obstructive Sleep Apnea  Recommendations: 1. Recommend a trial of ResMed CPAP at 13cm H2O with heated humidity and medium Simplus FFM. 2. The patient should be counseled on good sleep hygiene and avoid sleeping in supine position. 3. Encourage patient to avoid driving when sleepy. 4. Followup in Sleep Medicine clinic in 6 weeks.    This study was personally reviewed and electronically signed by: Wilbert Bihari MD Accredited Board Certified in Sleep Medicine Date/Time: 01/20/2024 12:47PM

## 2024-01-22 ENCOUNTER — Other Ambulatory Visit (HOSPITAL_COMMUNITY): Payer: Self-pay

## 2024-01-22 ENCOUNTER — Other Ambulatory Visit (INDEPENDENT_AMBULATORY_CARE_PROVIDER_SITE_OTHER)

## 2024-01-22 DIAGNOSIS — E118 Type 2 diabetes mellitus with unspecified complications: Secondary | ICD-10-CM

## 2024-01-22 DIAGNOSIS — I10 Essential (primary) hypertension: Secondary | ICD-10-CM

## 2024-01-22 MED ORDER — SEMAGLUTIDE (1 MG/DOSE) 4 MG/3ML ~~LOC~~ SOPN: 1.0000 mg | PEN_INJECTOR | SUBCUTANEOUS | 0 refills | Status: DC

## 2024-01-22 NOTE — Progress Notes (Signed)
 01/22/2024 Name: Victoria [name] MRN: [id number] DOB: 10-09-1980  Chief Complaint  Patient presents with   Medication Management   Diabetes   Hypertension    Victoria [name] is a 43 y.o. year old female who presented for a telephone visit.   They were referred to the pharmacist by their PCP for assistance in managing complex medication management.    Subjective:  Care Team: Primary Care Provider: Randol Dawes, MD ; Next Scheduled Visit: 02/19/24  Medication Access/Adherence  Current Pharmacy:  [id number] [place] [place] - [address] AT [facility] [address] [place] [address] Phone: [phone] Fax: [phone]  [facility] - [place] ([place] - [address] [place] ([place]) [place] [address] Phone: [phone] Fax: [phone]  [facility] [place] [facility] [address] [place] [address] Phone: [phone] Fax: [phone]   Patient reports affordability concerns with their medications: No Patient reports access/transportation concerns to their pharmacy: No  Patient reports adherence concerns with their medications:  No   Diabetes:  Current medications: Metformin  1000mg  BID - taking 500mg  in am, 1000mg  at night due to stomach upset concerns, Ozempic  1mf Medications tried in the past: Rybelsus  (cost-not covered by ins), Farxiga [name] (yeast issues), Bydureon  (not covered), Januvia  (stopped due to cost concern in the past), Did not tolerate dose increase of Victoza to 1.8mg  (severe nausea, could not keep food down, belching).  Current Glucose Readings:  Denies any fasting sugars above 130, reporting post-prandial sugars in the 140s-150s  Very happy with ozempic , has even had some intentional weight loss  Patient denies hypoglycemic s/sx including dizziness, shakiness, sweating. Patient denies hyperglycemic symptoms  including polyuria, polydipsia, polyphagia, nocturia, neuropathy, blurred vision.   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 Sig Documenting Provider Last Dose Status Informant  Accu-Chek Softclix Lancets lancets [id number]  Use as directed to check blood sugars once daily Randol Dawes, MD  Active   acetaminophen  (TYLENOL ) 500 MG tablet [id number] Yes Take 1,000 mg by mouth as needed for moderate pain (pain score 4-6). [provider]  Active            Med Note BEVERLEE, LUCIENNE JULIANNA Schaumann May 29, 2023  9:29 AM) As needed  albuterol  (PROVENTIL ) (2.5 MG/3ML) 0.083% nebulizer solution [id number] Yes Take 3 mLs (2.5 mg total) by nebulization every 6 (six) hours as needed for wheezing or shortness of breath. Tobie Arleta SQUIBB, MD  Active   albuterol  (VENTOLIN  HFA) 108 [phone] Base) MCG/ACT inhaler [id number] Yes Inhale 2 puffs into the lungs every 6 (six) hours as needed for wheezing or shortness of  breath. Tobie Arleta SQUIBB, MD  Active   atorvastatin  (LIPITOR) 20 MG tablet [id number] Yes Take 1 tablet (20 mg total) by mouth daily. Randol Dawes, MD  Active   Blood Glucose Monitoring Suppl (ACCU-CHEK GUIDE ME) w/Device KIT [id number]  Use as instructed to check blood sugar once daily Randol Dawes, MD  Active   carvedilol  (COREG ) 25 MG tablet [id number] Yes TAKE 1 TABLET  BY MOUTH TWICE A DAY Knapp, Eve, MD  Active   Cholecalciferol (VITAMIN D ) 50 MCG (2000 UT) CAPS [id number]  Take 2 capsules by mouth daily. [provider]  Active Self           Med Note REGINO HUNTER BIRCH   Mon Oct 23, 2020  4:34 AM)    Continuous Glucose Sensor (FREESTYLE LIBRE 3 PLUS SENSOR) [place] [id number]  1 each by Does not apply route every 14 (fourteen) days. Change sensor every 15 days.  Patient not taking: Reported on 12/08/2023   Randol Dawes, MD  Active   fluticasone  (FLONASE ) 50 MCG/ACT nasal spray [id number] Yes Place 2 sprays into both nostrils daily. Tobie Arleta SQUIBB, MD  Active   glucose blood (ACCU-CHEK GUIDE TEST) test strip [id number]  Use as directed to check blood sugar once daily Randol Dawes, MD  Active   ibuprofen  (ADVIL ) 600 MG tablet [id number] Yes Take 1 tablet (600 mg total) by mouth every 6 (six) hours as needed. Silver Wonda LABOR, PA  Active            Med Note BEVERLEE, [name]   Thu May 29, 2023  9:30 AM) As needed  Insulin  Pen Needle 32G X 4 MM MISC [id number]  1 Needle by Does not apply route daily. Early, Sara E, NP  Active   levocetirizine (XYZAL ) 5 MG tablet [id number] Yes Take 1 tablet (5 mg total) by mouth every evening. Tobie Arleta SQUIBB, MD  Active   loratadine (CLARITIN) 10 MG tablet [id number]  Take 10 mg by mouth daily.  Patient not taking: Reported on 01/22/2024   [provider]  Active            Med Note BEVERLEE, LUCIENNE JULIANNA Schaumann May 29, 2023  9:30 AM) As needed  meclizine  (ANTIVERT ) 25 MG tablet [id number] Yes Take 1 tablet (25 mg total) by mouth 3 (three) times daily as needed for dizziness. Desiderio Chew, PA-C  Active   metFORMIN  (GLUCOPHAGE ) 1000 MG tablet [id number]  Take 1 tablet (1,000 mg total) by mouth 2 (two) times daily with a meal. Randol Dawes, MD  Active   methocarbamol  (ROBAXIN ) 500 MG tablet [id number] Yes Take 1-2 tablets (500-1,000 mg total) by mouth every 8 (eight) hours as needed for muscle spasms. Randol Dawes, MD  Active            Med  Note BEVERLEE, [name]   Thu May 29, 2023  9:30 AM) As needed, rarely  Misc Natural Products (BEET ROOT) 500 MG CAPS [id number] Yes Take by mouth. [provider]  Active   Mometasone Furoate  (ASMANEX  HFA) 50 MCG/ACT AERO [id number]  INHALE 2 PUFFS INTO THE LUNGS IN THE MORNING AND AT BEDTIME. Tobie Arleta SQUIBB, MD  Active   Multiple Vitamins-Minerals (ONE A DAY WOMEN 50 PLUS PO) [id number] Yes Take 1 tablet by mouth daily. [provider]  Active            Med Note BEVERLEE, LUCIENNE JULIANNA [place] Apr 17, 2022 11:00 AM) With iron   Semaglutide ,  1 MG/DOSE, 4 MG/3ML SOPN [id number] Yes Inject 1 mg as directed once a week. Inject Ozempic  1mg  into skin once weekly Randol Dawes, MD  Active   spironolactone  (ALDACTONE ) 50 MG tablet [id number] Yes TAKE 1 TABLET BY MOUTH EVERY DAY Walker, Caitlin S, NP  Active   valsartan  (DIOVAN ) 320 MG tablet [id number] Yes TAKE 1 TABLET BY MOUTH EVERY DAY Randol Dawes, MD  Active               Assessment/Plan:   Diabetes: - Currently uncontrolled  but improving per home sugar report - Reviewed long term cardiovascular and renal outcomes of uncontrolled blood sugar - Reviewed goal A1c, goal fasting, and goal 2 hour post prandial glucose - Reviewed dietary modifications including low-carb diet - Reviewed lifestyle modifications including: achieving a total of of physical activity/week - Recommend to notify the office/myself IMMEDIATELY upon any delays or issues obtaining/taking medications  - Recommend to check glucose at least once daily  -Continue Ozempic  1mg  once weekly   Follow Up Plan: 2 months (sees PCP in 1 month)   [name], PharmD Clinical Pharmacist [phone]

## 2024-01-24 ENCOUNTER — Other Ambulatory Visit: Payer: Self-pay | Admitting: Family Medicine

## 2024-01-24 DIAGNOSIS — I10 Essential (primary) hypertension: Secondary | ICD-10-CM

## 2024-01-27 ENCOUNTER — Other Ambulatory Visit (HOSPITAL_COMMUNITY): Payer: Self-pay

## 2024-01-27 MED ORDER — ASMANEX HFA 50 MCG/ACT IN AERO
2.0000 | INHALATION_SPRAY | Freq: Two times a day (BID) | RESPIRATORY_TRACT | 0 refills | Status: AC
  Filled 2024-01-27: qty 13, 30d supply, fill #0

## 2024-01-27 NOTE — Addendum Note (Signed)
 Addended by: Homar Weinkauf E on: 01/27/2024 08:53 AM   Modules accepted: Orders

## 2024-01-27 NOTE — Addendum Note (Signed)
 Addended by: ONEITA CHRISTIANS D on: 01/27/2024 11:56 AM   Modules accepted: Orders

## 2024-01-28 ENCOUNTER — Other Ambulatory Visit (HOSPITAL_COMMUNITY): Payer: Self-pay

## 2024-02-06 ENCOUNTER — Other Ambulatory Visit (HOSPITAL_COMMUNITY): Payer: Self-pay

## 2024-02-10 ENCOUNTER — Telehealth: Payer: Self-pay | Admitting: *Deleted

## 2024-02-10 NOTE — Telephone Encounter (Signed)
-----   Message from Wilbert Bihari sent at 01/20/2024 12:49 PM EDT ----- Please let patient know that they had a successful PAP titration and let DME know that orders are in EPIC.  Please set up 6 week OV with me.

## 2024-02-10 NOTE — Addendum Note (Signed)
 Addended by: JOSHUA DALTON MATSU on: 02/10/2024 04:42 PM   Modules accepted: Orders, Level of Service

## 2024-02-10 NOTE — Telephone Encounter (Signed)
 This encounter was created in error - please disregard.

## 2024-02-10 NOTE — Telephone Encounter (Signed)
 Patient is returning your call. Please advise. ?

## 2024-02-10 NOTE — Telephone Encounter (Signed)
The patient has been notified of the result. Left detailed message on voicemail and informed patient to call back.

## 2024-02-11 NOTE — Telephone Encounter (Signed)
Patient is calling back. Please advise  

## 2024-02-11 NOTE — Telephone Encounter (Signed)
 Return Call:  Upon patient request DME selection is [facility]. Patient understands he will be contacted by [facility] to set up his cpap. Patient understands to call if [facility] does not contact him with new setup in a timely manner. Patient understands they will be called once confirmation has been received from [facility] that they have received their new machine to schedule 10 week follow up appointment.   [facility] notified of new cpap order  Please add to airview Patient was grateful for the call and thanked me.

## 2024-02-19 ENCOUNTER — Ambulatory Visit: Admitting: Family Medicine

## 2024-02-26 ENCOUNTER — Other Ambulatory Visit: Payer: Self-pay | Admitting: Family Medicine

## 2024-02-26 DIAGNOSIS — G4733 Obstructive sleep apnea (adult) (pediatric): Secondary | ICD-10-CM | POA: Diagnosis not present

## 2024-02-26 DIAGNOSIS — E118 Type 2 diabetes mellitus with unspecified complications: Secondary | ICD-10-CM

## 2024-03-02 ENCOUNTER — Encounter: Payer: Self-pay | Admitting: *Deleted

## 2024-03-02 NOTE — Progress Notes (Unsigned)
 No chief complaint on file.  Patient presents for 3 month follow-up on chronic problems.  Diabetes follow-up:  Last A1c was 10% in 10/2023. At that time she was on Metformin  1000 mg BID.  A1c had been lower in 05/2023 at 7.8% when on Victoza injections and metformin . She was started on Ozempic , and titrated up to current dose of 1mg  weekly.  She is tolerating this well.  She does have constipation--- ***UPDATE using miralax ?  At 01/22/24 visit with the pharmacist, she reported not taking metformin  1000 mg BID, but taking 500 mg in the morning, 1000 mg at night, due to GI side effects. Sugars were improved, and she was continued on 1 mg weekly of Ozempic .  ***  Sugars are running ***   Denies excessive thirst or urination.   At last visit she reported eating fast food frequently--due to working full time, not having time to cook. She was eating burgers, fries, diet sodas.  Last eye exam was 12/2022.  ***    Resistant HTN:  She was referred to the cardiologist. Compliant with carvedilol , spironolactone  and valsartan .   She actually hasn't seen cardiology clinic since 02/2023, other than related to her sleep studies (see below).  They refilled her spironolactone  for 6 months in June. Our office [place]) refilled her carvedilol  in August, and we last filled her valsartan  x 3 mos in early June.    BPs are running ***   BP Readings from Last 3 Encounters:  12/10/23 (!) 142/95  11/17/23 130/80  09/30/23 (!) 139/107    She denies dizziness, chest pain, shortness of breath, edema.     OSA: Under care of Dr. Shlomo.  She had a sleep study 06/2023 showing moderate OSA. She subsequently had in-lab CPAP titration done in August.  She had DME put through [facility].  ***   06/2023 mod OSA, needs in-lab CPAP titration ([facility])--done 12/2023. [facility]  Vitamin D  deficiency:  Last level was normal at 35.8 in June, when taking 4000 IU daily. She continues to take 4000 IU daily.     Component Ref Range & Units (hover) 3 mo ago (11/17/23) 1 yr ago (11/07/22) 2 yr ago (12/27/21) 3 yr ago (03/13/20) 4 yr ago (12/08/19) 4 yr ago (08/25/19) 5 yr ago (01/07/19)  Vit D, 25-Hydroxy 35.8 28.7 Low  CM 30.3 CM 29.7 Low  CM 29.6 Low  CM 18.4 Low  CM 18.1 Low  CM     Hyperlipidemia:  She is taking atorvastatin  20mg  and denies side effects. Still using condoms for contraception, and aware of the potential risks of statins and pregnancy.  Last lipids were at goal  Lab Results  Component Value Date   CHOL 141 05/29/2023   HDL 52 05/29/2023   LDLCALC 67 05/29/2023   TRIG 124 05/29/2023   CHOLHDL 2.7 05/29/2023      Anemia: She reports that her menses remain heavy, monthly.  She hasn't seen GYN in over 4-5 years. She has appt with Dr. Horacio scheduled for later this month.  She has been taking a MVI with iron , no separate iron . She eats liver pudding when on her cycles Denies craving ice.  Has some intermittent dizziness, LH, not necessarily related to her cycles. In June, Hgb was better, iron  studies okay.  Last CBC was done in ER in July, and Hgb down to 10.5 again.  Component Ref Range & Units (hover) 2 mo ago (12/10/23) 3 mo ago (11/17/23) 5 mo ago (09/30/23) 1 yr ago (  11/07/22) 1 yr ago (06/05/22) 1 yr ago (04/17/22) 2 yr ago (12/23/21)  WBC 7.1 8.3 R 7.1 6.6 R 8.0 5.7 R 7.7  RBC 3.96 4.78 R 4.26 4.66 R 4.35 4.49 R 3.98  Hemoglobin 10.5 Low  12.7 R 11.3 Low  11.9 R 10.6 Low  10.8 Low  R 9.4 Low   HCT 32.2 Low  40.2 R 33.7 Low  36.3 R 32.9 Low  33.2 Low  R 29.9 Low   MCV 81.3 84 R 79.1 Low  78 Low  R 75.6 Low  74 Low  R 75.1 Low   MCH 26.5 26.6 R 26.5 25.5 Low  R 24.4 Low  24.1 Low  R 23.6 Low   MCHC 32.6 31.6 R 33.5 32.8 R 32.2 32.5 R 31.4  RDW 13.8 14.7 R 14.0 15.1 R 13.8 15.4 R 14.6  Platelets 276 333 R 286 311 R 333 327 R 311    Lab Results  Component Value Date   IRON  35 11/17/2023   TIBC 399 11/17/2023   FERRITIN 23 11/17/2023     Allergies and  asthma: she saw allergist in 06/2023. She was prescribed Asmanex , flonase  and xyzal . She never started the asmanex , wasn't covered by her insurance.   In June she reported her allergies are well controlled with claritin, flonase  and montelukast.  ***UPDATE--asmanex  was refilled in September.  Taking?  Last used albuterol  ***   PMH, PSH, SH reviewed   ROS: no fever, chills, URI symptoms, headaches, dizziness, chest pain, shortness of breath with activity, GI or GU complaints.  Constipation is mild and controlled with miralax . *** Allergies *** Unrefreshed sleep, daytime somnolence. *** Moods are good. Heavy cycles continue    PHYSICAL EXAM:  There were no vitals taken for this visit.  Wt Readings from Last 3 Encounters:  01/15/24 272 lb (123.4 kg)  11/17/23 274 lb (124.3 kg)  07/24/23 281 lb (127.5 kg)    Well-appearing, pleasant, obese female in no distress HEENT: conjunctiva and sclera are clear, EOMI.  OP clear. Sinuses nontender. Neck: no lymphadenopathy, thyromegaly or mass, no bruit Heart: regular rate and rhythm, no murmur Lungs: clear bilaterally, no wheezes Back: no spinal tenderness or CVA tenderness Abdomen: soft, nontender, no mass Extremities: no edema, normal pulses. Normal sensation to monofilament. 2+ pulses (normal diabetic foot exam) Psych: normal mood, affect, hygiene and grooming Neuro: alert and oriented, normal gait   ASSESSMENT/PLAN:   Last DM eye exam was 12/2022.  Scheduled, or had one since?  Did she get CPAP from [facility] and is she using??  She has no f/u with allergist. Should ensure she gets that scheduled before she runs out of meds. She should contact them to see when due.  BP meds were supposed to come from cardiology. Past due to be seen. Spironolactone  last filled by cardiology, #90 with 1 RF in June. Last visit there for HTN was 02/2023 (mainly talking to them about her sleep apnea, not being seen for f/u HTN). [facility] last  filled her carvedilol  in August. You refilled valsartan  in June. (X3 mos--did she run out???) taking regularly??  She really probably should f/u with them. I guess if BP is good, I can refill...  If NOT good, only RF x30d and ensure she schedules visit.   Last Hgb much lower in July in ER. Rechecking CBC, iron  studies. Will need to forward to Dr. Horacio UNLESS SHE PREFERS HIS OFFICE TO CHECK LABS.

## 2024-03-03 ENCOUNTER — Ambulatory Visit (INDEPENDENT_AMBULATORY_CARE_PROVIDER_SITE_OTHER): Admitting: Family Medicine

## 2024-03-03 ENCOUNTER — Encounter: Payer: Self-pay | Admitting: Family Medicine

## 2024-03-03 VITALS — BP 130/88 | HR 84 | Ht 64.0 in | Wt 267.0 lb

## 2024-03-03 DIAGNOSIS — E1159 Type 2 diabetes mellitus with other circulatory complications: Secondary | ICD-10-CM

## 2024-03-03 DIAGNOSIS — I10 Essential (primary) hypertension: Secondary | ICD-10-CM | POA: Diagnosis not present

## 2024-03-03 DIAGNOSIS — J45909 Unspecified asthma, uncomplicated: Secondary | ICD-10-CM | POA: Diagnosis not present

## 2024-03-03 DIAGNOSIS — E1169 Type 2 diabetes mellitus with other specified complication: Secondary | ICD-10-CM | POA: Diagnosis not present

## 2024-03-03 DIAGNOSIS — I152 Hypertension secondary to endocrine disorders: Secondary | ICD-10-CM | POA: Diagnosis not present

## 2024-03-03 DIAGNOSIS — G4733 Obstructive sleep apnea (adult) (pediatric): Secondary | ICD-10-CM | POA: Diagnosis not present

## 2024-03-03 DIAGNOSIS — E66813 Obesity, class 3: Secondary | ICD-10-CM

## 2024-03-03 DIAGNOSIS — E118 Type 2 diabetes mellitus with unspecified complications: Secondary | ICD-10-CM

## 2024-03-03 DIAGNOSIS — E785 Hyperlipidemia, unspecified: Secondary | ICD-10-CM

## 2024-03-03 DIAGNOSIS — Z23 Encounter for immunization: Secondary | ICD-10-CM | POA: Diagnosis not present

## 2024-03-03 DIAGNOSIS — J301 Allergic rhinitis due to pollen: Secondary | ICD-10-CM

## 2024-03-03 DIAGNOSIS — Z6841 Body Mass Index (BMI) 40.0 and over, adult: Secondary | ICD-10-CM

## 2024-03-03 DIAGNOSIS — D509 Iron deficiency anemia, unspecified: Secondary | ICD-10-CM | POA: Diagnosis not present

## 2024-03-03 LAB — POCT GLYCOSYLATED HEMOGLOBIN (HGB A1C): Hemoglobin A1C: 6.4 % — AB (ref 4.0–5.6)

## 2024-03-03 MED ORDER — SEMAGLUTIDE (1 MG/DOSE) 4 MG/3ML ~~LOC~~ SOPN: 1.0000 mg | PEN_INJECTOR | SUBCUTANEOUS | 2 refills | Status: AC

## 2024-03-03 MED ORDER — CARVEDILOL 25 MG PO TABS: 25.0000 mg | ORAL_TABLET | Freq: Two times a day (BID) | ORAL | 0 refills | Status: DC

## 2024-03-03 MED ORDER — METFORMIN HCL 1000 MG PO TABS: ORAL_TABLET | ORAL | 1 refills | Status: DC

## 2024-03-03 NOTE — Patient Instructions (Addendum)
 Contact your allergist for a substitute for the asmanex , since you haven't been able to get it, and are needing albuterol  on a weekly basis. You need something on board for prevention. You are likely due to see them.  Also because you were supposed to do the testing.  Follow up with cardiology on your blood pressure. Be sure to try and not have gaps in between fills of medication. Contact Angela with any issues or concerns with your medications (the pharmacist).

## 2024-03-04 ENCOUNTER — Ambulatory Visit: Payer: Self-pay | Admitting: Family Medicine

## 2024-03-04 LAB — CBC WITH DIFFERENTIAL/PLATELET
Basophils Absolute: 0 x10E3/uL (ref 0.0–0.2)
Basos: 0 %
EOS (ABSOLUTE): 0.1 x10E3/uL (ref 0.0–0.4)
Eos: 2 %
Hematocrit: 34.2 % (ref 34.0–46.6)
Hemoglobin: 10.9 g/dL — ABNORMAL LOW (ref 11.1–15.9)
Immature Grans (Abs): 0 x10E3/uL (ref 0.0–0.1)
Immature Granulocytes: 0 %
Lymphocytes Absolute: 2.1 x10E3/uL (ref 0.7–3.1)
Lymphs: 30 %
MCH: 26.3 pg — ABNORMAL LOW (ref 26.6–33.0)
MCHC: 31.9 g/dL (ref 31.5–35.7)
MCV: 83 fL (ref 79–97)
Monocytes Absolute: 0.6 x10E3/uL (ref 0.1–0.9)
Monocytes: 8 %
Neutrophils Absolute: 4.2 x10E3/uL (ref 1.4–7.0)
Neutrophils: 60 %
Platelets: 314 x10E3/uL (ref 150–450)
RBC: 4.14 x10E6/uL (ref 3.77–5.28)
RDW: 13.8 % (ref 11.7–15.4)
WBC: 7 x10E3/uL (ref 3.4–10.8)

## 2024-03-04 LAB — IRON,TIBC AND FERRITIN PANEL
Ferritin: 23 ng/mL (ref 15–150)
Iron Saturation: 13 % — AB (ref 15–55)
Iron: 41 ug/dL (ref 27–159)
Total Iron Binding Capacity: 325 ug/dL (ref 250–450)
UIBC: 284 ug/dL (ref 131–425)

## 2024-03-04 LAB — MICROALBUMIN / CREATININE URINE RATIO
Creatinine, Urine: 109.1 mg/dL
Microalb/Creat Ratio: 7 mg/g{creat} (ref 0–29)
Microalbumin, Urine: 8.1 ug/mL

## 2024-03-22 ENCOUNTER — Telehealth: Payer: Self-pay | Admitting: *Deleted

## 2024-03-22 ENCOUNTER — Other Ambulatory Visit: Payer: Self-pay | Admitting: Family Medicine

## 2024-03-22 NOTE — Telephone Encounter (Signed)
 Copied from CRM #[id number]. Topic: General - Call Back - No Documentation >> Mar 22, 2024 12:45 PM Charlet HERO wrote: Reason for CRM: Patient is stating that she did need the 1 mg instead of the 2.5 mg she made a mistake and has corrected it. She just wanted to inform.   Noted.

## 2024-03-22 NOTE — Telephone Encounter (Signed)
 Noted, patient has rx for 1mg  at pharmacy, I did call her and confirm.

## 2024-03-22 NOTE — Telephone Encounter (Signed)
 I think patient is now on 1mg , left message to confirm that she does not need this.

## 2024-04-01 ENCOUNTER — Other Ambulatory Visit: Payer: Self-pay

## 2024-04-01 DIAGNOSIS — E118 Type 2 diabetes mellitus with unspecified complications: Secondary | ICD-10-CM

## 2024-04-01 NOTE — Progress Notes (Addendum)
 04/01/2024 Name: Victoria [name] MRN: [id number] DOB: 06-30-1980  Chief Complaint  Patient presents with   Medication Management   Diabetes   Hypertension    Victoria [name] is a 43 y.o. year old female who presented for a telephone visit.   They were referred to the pharmacist by their PCP for assistance in managing complex medication management.    Subjective:  Care Team: Primary Care Provider: Randol Dawes, MD  Medication Access/Adherence  Current Pharmacy:  [facility] [phone] - [place] - [address] AT [facility] [address] [place] [address] Phone: [phone] Fax: [phone]  [id number] - [address]), [place] - [address] [place] ([place]) [place] [address] Phone: [phone] Fax: [phone]  [facility] [name] [place] [address] Phone: [phone] Fax: [phone]  [place] [facility] [address] [place] [address] Phone: [phone] Fax: [phone]   Patient reports affordability concerns with their medications: No Patient reports access/transportation concerns to their pharmacy: No  Patient reports adherence concerns with their medications:  No   Diabetes:  Current medications: Metformin  1000mg  BID - taking 500mg  in am, 1000mg  at night due to stomach upset concerns, Ozempic  1mg  Medications tried in the past: Rybelsus  (cost-not covered by ins), Farxiga [name] (yeast issues), Bydureon  (not covered), Januvia  (stopped due to cost concern in the past), Did not tolerate dose increase of Victoza to 1.8mg  (severe nausea, could not keep food down, belching).  Current Glucose Readings:  Denies any fasting sugars above 130, reporting post-prandial sugars in the 140s-150s  Very happy with ozempic , has even had some intentional weight loss  Patient  denies hypoglycemic s/sx including dizziness, shakiness, sweating. Patient denies hyperglycemic symptoms including polyuria, polydipsia, polyphagia, nocturia, neuropathy, blurred vision.   Objective:  Lab Results  Component Value Date   HGBA1C 6.4 (A) 03/03/2024    Lab Results  Component Value Date   CREATININE 0.74 12/10/2023   BUN 8 12/10/2023   NA 137 12/10/2023   K 4.1 12/10/2023   CL 105 12/10/2023   CO2 20 (L) 12/10/2023    Lab Results  Component Value Date   CHOL 141 05/29/2023   HDL 52 05/29/2023   LDLCALC 67 05/29/2023   TRIG 124 05/29/2023   CHOLHDL 2.7 05/29/2023    Medications Reviewed Today     Reviewed by Victoria [name], RPH (Pharmacist) on 04/01/24 at 0906  Med List Status: <None>   Medication Order Taking? Sig Documenting Provider Last Dose Status Informant  Accu-Chek Softclix Lancets lancets [id number]  Use as directed to check blood sugars once daily Victoria Dawes, MD  Active   acetaminophen  (TYLENOL ) 500 MG tablet [id number]  Take 1,000 mg by mouth as needed for moderate pain (pain score 4-6). [provider]  Active            Med Note Victoria [name]   Wed Mar 03, 2024 10:51 AM) As needed  albuterol  (PROVENTIL ) (2.5 MG/3ML) 0.083% nebulizer solution [id number]  Take 3 mLs (2.5 mg total) by nebulization every 6 (six) hours as needed for wheezing or shortness of breath. Victoria Arleta SQUIBB, MD  Active            Med Note Victoria [name]   Wed Mar 03, 2024 11:37 AM) Only needs when very sick  albuterol  (VENTOLIN  HFA) 108 (90 Base) MCG/ACT inhaler [id number]  Inhale 2 puffs into  the lungs every 6 (six) hours as needed for wheezing or shortness of breath. Victoria Arleta SQUIBB, MD  Active            Med Note Victoria [name]   Wed Mar 03, 2024 11:37 AM) Using about 2x/week, more often when sick  atorvastatin  (LIPITOR) 20 MG tablet [id number] Yes Take 1 tablet (20 mg total) by mouth daily. Victoria Dawes, MD  Active   Blood Glucose Monitoring Suppl (ACCU-CHEK GUIDE ME)  w/Device KIT [id number]  Use as instructed to check blood sugar once daily Victoria Dawes, MD  Active   carvedilol  (COREG ) 25 MG tablet [id number] Yes Take 1 tablet (25 mg total) by mouth 2 (two) times daily. Victoria Dawes, MD  Active   Cholecalciferol (VITAMIN D ) 50 MCG (2000 UT) CAPS [id number]  Take 2 capsules by mouth daily. [provider]  Active Self           Med Note Victoria [name] Pablo Oct 23, 2020  4:34 AM)    fluticasone  (FLONASE ) 50 MCG/ACT nasal spray [id number]  Place 2 sprays into both nostrils daily. Victoria Arleta SQUIBB, MD  Active   glucose blood (ACCU-CHEK GUIDE TEST) test strip [id number]  Use as directed to check blood sugar once daily Victoria Dawes, MD  Active   ibuprofen  (ADVIL ) 600 MG tablet [id number]  Take 1 tablet (600 mg total) by mouth every 6 (six) hours as needed. Victoria [name], [place]  Active            Med Note Victoria [name]   Wed Mar 03, 2024 10:53 AM) As needed  Insulin  Pen Needle 32G X 4 MM MISC [id number]  1 Needle by Does not apply route daily. Early, Sara E, NP  Active   levocetirizine (XYZAL ) 5 MG tablet [id number]  Take 1 tablet (5 mg total) by mouth every evening. Victoria Arleta SQUIBB, MD  Active   meclizine  (ANTIVERT ) 25 MG tablet [id number]  Take 1 tablet (25 mg total) by mouth 3 (three) times daily as needed for dizziness. Victoria Chew, Victoria [name]  Active   metFORMIN  (GLUCOPHAGE ) 1000 MG tablet [id number]  Take 1 tablet by mouth before breakfast, and full tablet before dinner Victoria Dawes, MD  Active   methocarbamol  (ROBAXIN ) 500 MG tablet [id number]  Take 1-2 tablets (500-1,000 mg total) by mouth every 8 (eight) hours as needed for muscle spasms. Victoria Dawes, MD  Active            Med Note Victoria [name]   Wed Mar 03, 2024 10:56 AM) As needed  Misc Natural Products (BEET ROOT) 500 MG CAPS [id number]  Take by mouth. [provider]  Active   Mometasone  Furoate (ASMANEX  HFA) 50 MCG/ACT AERO [id number]  Inhale 2 puffs into the lungs in the morning and at bedtime.  Victoria Arleta SQUIBB, MD  Active            Med Note Victoria [name]   Wed Mar 03, 2024 10:58 AM) Never got, no one has in stock  Multiple Vitamins-Minerals (ONE A DAY WOMEN 50 PLUS PO) [id number]  Take 1 tablet by mouth daily. [provider]  Active            Med Note BEVERLEE, LUCIENNE JULIANNA [place] Apr 17, 2022 11:00 AM) With iron   Semaglutide , 1 MG/DOSE, 4 MG/3ML SOPN [id number] Yes Inject 1 mg as directed once a week. Inject Ozempic  1mg  into skin once weekly Victoria Dawes, MD  Active  spironolactone  (ALDACTONE ) 50 MG tablet [id number] Yes TAKE 1 TABLET BY MOUTH EVERY DAY Walker, Caitlin S, NP  Active   valsartan  (DIOVAN ) 320 MG tablet [id number] Yes TAKE 1 TABLET BY MOUTH EVERY DAY Victoria Dawes, MD  Active            Med Note (KNAPP, [name]   Wed Mar 03, 2024 11:40 AM) Reports she ran out of this a week or two ago              Assessment/Plan:   Diabetes: - Currently controlled - Reviewed long term cardiovascular and renal outcomes of uncontrolled blood sugar - Reviewed goal A1c, goal fasting, and goal 2 hour post prandial glucose - Reviewed dietary modifications including low-carb diet - Reviewed lifestyle modifications including: achieving a total of of physical activity/week - Recommend to notify the office/myself IMMEDIATELY upon any delays or issues obtaining/taking medications  - Recommend to check glucose at least once daily  -Continue Ozempic  1mg  once weekly   Follow Up Plan: 2 months   [name], PharmD Clinical Pharmacist [phone]

## 2024-04-29 NOTE — Telephone Encounter (Signed)
 Reached out to [facility] ato have patieetn added to [facility] and was informed that the patients minimum usage has not been met and there is not an appointment scheduled for her. [facility] says the patient has 30 days to get compliant or she will face losing her device.

## 2024-05-17 ENCOUNTER — Other Ambulatory Visit: Payer: Self-pay | Admitting: Family Medicine

## 2024-05-17 DIAGNOSIS — I10 Essential (primary) hypertension: Secondary | ICD-10-CM

## 2024-05-25 ENCOUNTER — Telehealth: Admitting: Physician Assistant

## 2024-05-25 ENCOUNTER — Emergency Department (HOSPITAL_BASED_OUTPATIENT_CLINIC_OR_DEPARTMENT_OTHER)
Admission: EM | Admit: 2024-05-25 | Discharge: 2024-05-25 | Discharge status: Against Medical Advice | Attending: Emergency Medicine | Admitting: Emergency Medicine

## 2024-05-25 ENCOUNTER — Other Ambulatory Visit: Payer: Self-pay

## 2024-05-25 DIAGNOSIS — M545 Low back pain, unspecified: Secondary | ICD-10-CM | POA: Insufficient documentation

## 2024-05-25 DIAGNOSIS — M5442 Lumbago with sciatica, left side: Secondary | ICD-10-CM | POA: Diagnosis not present

## 2024-05-25 DIAGNOSIS — Z5321 Procedure and treatment not carried out due to patient leaving prior to being seen by health care provider: Secondary | ICD-10-CM | POA: Diagnosis not present

## 2024-05-25 NOTE — ED Triage Notes (Signed)
 Patient reports lower back that began this afternoon. Reports sudden pain when trying to get out of bed today. Denies urinary incontinence/other urinary symptoms.

## 2024-05-26 MED ORDER — CYCLOBENZAPRINE HCL 10 MG PO TABS: 5.0000 mg | ORAL_TABLET | Freq: Three times a day (TID) | ORAL | 0 refills | Status: AC | PRN

## 2024-05-26 MED ORDER — NAPROXEN 500 MG PO TABS: 500.0000 mg | ORAL_TABLET | Freq: Two times a day (BID) | ORAL | 0 refills | Status: AC

## 2024-05-26 NOTE — Progress Notes (Signed)
 We are sorry that you are not feeling well.  Here is how we plan to help!  Based on what you have shared with me it, appears you may be experiencing acute back pain.   Acute back pain is defined as musculoskeletal pain that can resolve in 1-3 weeks with conservative treatment.  I have prescribed a non-steroid anti-inflammatory (NSAID) Naprosyn  500 mg -- take one by mouth twice a day, as well as a muscle relaxant, Flexeril  10 mg every eight hours as needed. Some patients experience stomach irritation or in increased heartburn with anti-inflammatory drugs.  Please keep in mind that muscle relaxer's can cause fatigue and should not be taken while at work or driving.  Back pain is very common.  The pain often gets better over time.  The cause of back pain is usually not dangerous.  Most people can learn to manage their back pain on their own.  Home Care Stay active.  Start with short walks on flat ground if you can.  Try to walk farther each day. Do not sit, drive or stand in one place for more than 30 minutes.  Do not stay in bed. Do not fully avoid exercise or work.  Activity can help your back heal faster. Be careful when you bend or lift an object.  Bend at your knees, keep the object close to you, and do not twist. Sleep on a firm mattress.  Lie on your side, and bend your knees.  If you lie on your back, put a pillow under your knees. Only take medicines as told by your doctor. Put ice on the injured area. Put ice in a plastic bag Place a towel between your skin and the bag Leave the ice on for 15-20 minutes, 3-4 times a day for the first 2-3 days.  After that, you can switch between ice and heat packs. Ask your doctor about back exercises or massage.   Get Help Right Way If: Your pain does not go away with rest and treatments given today. Your pain does not go away within 1 week. You have new problems. You do not feel well. The pain spreads into your legs. You cannot control when you  poop (bowel movement) or pee (urinate). You feel sick to your stomach (nauseous) or throw up (vomit). You have belly (abdominal) pain. You feel like you may pass out (faint). If you develop a fever.  Make Sure you: Understand these instructions. Continue to monitor your condition for any changes. Will get help right away if you are not doing well or get worse.  Your e-visit answers were reviewed by a board certified advanced clinical practitioner to complete your personal care plan.  Depending on the condition, your plan could have included both over the counter or prescription medications.  If there is a problem, please reply once you have received a response from your provider.  Your safety is important to us .  If you have drug allergies, check your prescription carefully.    You can use MyChart to ask questions about todays visit, request a non-urgent call back, or ask for a work or school excuse for 24 hours related to this e-Visit. If it has been greater than 24 hours you will need to follow up with your provider or enter a new e-Visit to address those concerns.  You will get an e-mail in the next two days asking about your experience.  I hope that your e-visit has been valuable and will speed your  recovery. Thank you for using e-visits.   I have spent 5 minutes in review of e-visit questionnaire, review and updating patient chart, medical decision making and response to patient.   Delon CHRISTELLA Dickinson, PA-C

## 2024-06-10 ENCOUNTER — Other Ambulatory Visit: Payer: Self-pay | Admitting: Family Medicine

## 2024-06-10 ENCOUNTER — Other Ambulatory Visit

## 2024-06-10 DIAGNOSIS — E118 Type 2 diabetes mellitus with unspecified complications: Secondary | ICD-10-CM

## 2024-06-10 DIAGNOSIS — I10 Essential (primary) hypertension: Secondary | ICD-10-CM

## 2024-06-10 NOTE — Progress Notes (Signed)
 "  06/10/2024 Name: Victoria [name] MRN: [id number] DOB: 1980-12-27  Chief Complaint  Patient presents with   Medication Management   Diabetes   Hypertension    Victoria [name] is a 44 y.o. year old female who presented for a telephone visit.   They were referred to the pharmacist by their PCP for assistance in managing complex medication management.    Subjective:  Care Team: Primary Care Provider: Randol Dawes, MD  Medication Access/Adherence  Current Pharmacy:  [facility] [phone] - [place], [place] - [address] AT [facility] [address] [place] [address] Phone: [phone] Fax: [phone]  [id number] - [address] ([place]), [place] - [address] [place] ([place]) [place] [address] Phone: [phone] Fax: [phone]  [facility] [address] [place] [address] Phone: [phone] Fax: [phone]  [place] [facility] [address] [place] [address] Phone: [phone] Fax: [phone]   Patient reports affordability concerns with their medications: No Patient reports access/transportation concerns to their pharmacy: No  Patient reports adherence concerns with their medications:  No   Diabetes:  Current medications: Metformin  1000mg  BID - taking 500mg  in am, 1000mg  at night due to stomach upset concerns, Ozempic  1mg  Medications tried in the past: Rybelsus  (cost-not covered by ins), Farxiga [name] (yeast issues), Bydureon  (not covered), Januvia  (stopped due to cost concern in the past), Did not tolerate dose increase of Victoza to 1.8mg  (severe nausea, could not keep food down, belching).  Current Glucose Readings:  Denies any fasting sugars above 130, reporting post-prandial sugars in the 140s-150s  Very happy with ozempic , has even had some intentional weight loss  Patient  denies hypoglycemic s/sx including dizziness, shakiness, sweating. Patient denies hyperglycemic symptoms including polyuria, polydipsia, polyphagia, nocturia, neuropathy, blurred vision.   Objective:  Lab Results  Component Value Date   HGBA1C 6.4 (A) 03/03/2024    Lab Results  Component Value Date   CREATININE 0.74 12/10/2023   BUN 8 12/10/2023   NA 137 12/10/2023   K 4.1 12/10/2023   CL 105 12/10/2023   CO2 20 (L) 12/10/2023    Lab Results  Component Value Date   CHOL 141 05/29/2023   HDL 52 05/29/2023   LDLCALC 67 05/29/2023   TRIG 124 05/29/2023   CHOLHDL 2.7 05/29/2023    Medications Reviewed Today     Reviewed by Lionell Jon DEL, RPH (Pharmacist) on 06/10/24 at 0935  Med List Status: <None>   Medication Order Taking? Sig Documenting Provider Last Dose Status Informant  Accu-Chek Softclix Lancets lancets [id number]  Use as directed to check blood sugars once daily Randol Dawes, MD  Active   acetaminophen  (TYLENOL ) 500 MG tablet [id number] Yes Take 1,000 mg by mouth as needed for moderate pain (pain score 4-6). [provider]  Active            Med Note TEODORA, LUCIENNE FALCON   Wed Mar 03, 2024 10:51 AM) As needed  albuterol  (PROVENTIL ) (2.5 MG/3ML) 0.083% nebulizer solution [id number] Yes Take 3 mLs (2.5 mg total) by nebulization every 6 (six) hours as needed for wheezing or shortness of breath. Tobie Arleta SQUIBB, MD  Active            Med Note CYNTHA, EVE   Wed Mar 03, 2024 11:37 AM) Only needs when very sick  albuterol  (VENTOLIN  HFA) 108 (90 Base) MCG/ACT inhaler [id number] Yes Inhale 2 puffs  into the lungs every 6 (six) hours as needed for wheezing or shortness of breath. Tobie Arleta SQUIBB, MD  Active            Med Note CYNTHA, EVE   Wed Mar 03, 2024 11:37 AM) Using about 2x/week, more often when sick  atorvastatin  (LIPITOR) 20 MG tablet [id number] Yes Take 1 tablet (20 mg total) by mouth daily. Randol Dawes, MD  Active   Blood Glucose Monitoring Suppl (ACCU-CHEK GUIDE  ME) w/Device KIT [id number]  Use as instructed to check blood sugar once daily Randol Dawes, MD  Active   carvedilol  (COREG ) 25 MG tablet [id number]  TAKE 1 TABLET BY MOUTH TWICE A DAY Knapp, Eve, MD  Active   Cholecalciferol (VITAMIN D ) 50 MCG (2000 UT) CAPS [id number] Yes Take 2 capsules by mouth daily. [provider]  Active Self           Med Note REGINO, MISTY D   Mon Oct 23, 2020  4:34 AM)    cyclobenzaprine  (FLEXERIL ) 10 MG tablet [id number]  Take 0.5-1 tablets (5-10 mg total) by mouth 3 (three) times daily as needed. Vivienne Nest M, PA-C  Active   fluticasone  (FLONASE ) 50 MCG/ACT nasal spray [id number] Yes Place 2 sprays into both nostrils daily. Tobie Arleta SQUIBB, MD  Active   glucose blood (ACCU-CHEK GUIDE TEST) test strip [id number]  Use as directed to check blood sugar once daily Randol Dawes, MD  Active   ibuprofen  (ADVIL ) 600 MG tablet [id number] Yes Take 1 tablet (600 mg total) by mouth every 6 (six) hours as needed. Silver Wonda LABOR, [place]  Active            Med Note TEODORA, [name]   Wed Mar 03, 2024 10:53 AM) As needed  Insulin  Pen Needle 32G X 4 MM MISC [id number]  1 Needle by Does not apply route daily. Early, Sara E, NP  Active   levocetirizine (XYZAL ) 5 MG tablet [id number] Yes Take 1 tablet (5 mg total) by mouth every evening. Tobie Arleta SQUIBB, MD  Active   meclizine  (ANTIVERT ) 25 MG tablet [id number] Yes Take 1 tablet (25 mg total) by mouth 3 (three) times daily as needed for dizziness. Desiderio Chew, PA-C  Active   metFORMIN  (GLUCOPHAGE ) 1000 MG tablet [id number]  Take 1 tablet by mouth before breakfast, and full tablet before dinner Randol Dawes, MD  Active   Misc Natural Products (BEET ROOT) 500 MG CAPS [id number]  Take by mouth. [provider]  Active   Mometasone  Furoate (ASMANEX  HFA) 50 MCG/ACT AERO [id number]  Inhale 2 puffs into the lungs in the morning and at bedtime. Tobie Arleta SQUIBB, MD  Active            Med Note TEODORA, [name]   Wed Mar 03, 2024 10:58 AM)  Never got, no one has in stock  Multiple Vitamins-Minerals (ONE A DAY WOMEN 50 PLUS PO) [id number] Yes Take 1 tablet by mouth daily. [provider]  Active            Med Note BEVERLEE, LUCIENNE JULIANNA [place] Apr 17, 2022 11:00 AM) With iron   naproxen  (NAPROSYN ) 500 MG tablet [id number]  Take 1 tablet (500 mg total) by mouth 2 (two) times daily with a meal. Vivienne Nest HERO, PA-C  Active   Semaglutide , 1 MG/DOSE, 4 MG/3ML SOPN [id number] Yes Inject 1 mg as directed once a week. Inject Ozempic  1mg  into skin once weekly Randol Dawes, MD  Active  spironolactone  (ALDACTONE ) 50 MG tablet [id number] Yes TAKE 1 TABLET BY MOUTH EVERY DAY Walker, Caitlin S, NP  Active   valsartan  (DIOVAN ) 320 MG tablet [id number] Yes TAKE 1 TABLET BY MOUTH EVERY DAY Randol Dawes, MD  Active               Assessment/Plan:   Diabetes: - Currently controlled - Reviewed long term cardiovascular and renal outcomes of uncontrolled blood sugar - Reviewed goal A1c, goal fasting, and goal 2 hour post prandial glucose - Reviewed dietary modifications including low-carb diet - Reviewed lifestyle modifications including: achieving a total of of physical activity/week - Recommend to notify the office/myself IMMEDIATELY upon any delays or issues obtaining/taking medications  - Recommend to check glucose at least once daily  -Continue Ozempic  1mg  once weekly   Follow Up Plan: 2 months   [name], PharmD Clinical Pharmacist [phone]    "

## 2024-06-13 ENCOUNTER — Other Ambulatory Visit: Payer: Self-pay | Admitting: Family Medicine

## 2024-06-13 DIAGNOSIS — E118 Type 2 diabetes mellitus with unspecified complications: Secondary | ICD-10-CM

## 2024-07-07 ENCOUNTER — Ambulatory Visit: Payer: Self-pay | Admitting: Family Medicine

## 2024-08-05 ENCOUNTER — Other Ambulatory Visit

## 2024-11-17 ENCOUNTER — Encounter: Payer: Self-pay | Admitting: Family Medicine
